# Patient Record
Sex: Female | Born: 1935 | Race: White | Hispanic: No | Smoking: Former smoker
Health system: Southern US, Community
[De-identification: ages and names within clinical notes are randomized; demographics above are authoritative.]

## PROBLEM LIST (undated history)

## (undated) DIAGNOSIS — E1151 Type 2 diabetes mellitus with diabetic peripheral angiopathy without gangrene: Secondary | ICD-10-CM

## (undated) DIAGNOSIS — E559 Vitamin D deficiency, unspecified: Secondary | ICD-10-CM

## (undated) DIAGNOSIS — IMO0001 Reserved for inherently not codable concepts without codable children: Secondary | ICD-10-CM

## (undated) DIAGNOSIS — R059 Cough, unspecified: Secondary | ICD-10-CM

## (undated) DIAGNOSIS — M19011 Primary osteoarthritis, right shoulder: Secondary | ICD-10-CM

## (undated) DIAGNOSIS — I4819 Other persistent atrial fibrillation: Secondary | ICD-10-CM

## (undated) DIAGNOSIS — E08319 Diabetes mellitus due to underlying condition with unspecified diabetic retinopathy without macular edema: Secondary | ICD-10-CM

## (undated) DIAGNOSIS — F0390 Unspecified dementia without behavioral disturbance: Secondary | ICD-10-CM

## (undated) DIAGNOSIS — G3183 Dementia with Lewy bodies: Secondary | ICD-10-CM

## (undated) DIAGNOSIS — H542X11 Low vision right eye category 1, low vision left eye category 1: Secondary | ICD-10-CM

## (undated) DIAGNOSIS — Z794 Long term (current) use of insulin: Secondary | ICD-10-CM

## (undated) DIAGNOSIS — N809 Endometriosis, unspecified: Secondary | ICD-10-CM

## (undated) DIAGNOSIS — R159 Full incontinence of feces: Secondary | ICD-10-CM

## (undated) DIAGNOSIS — M545 Low back pain, unspecified: Secondary | ICD-10-CM

## (undated) DIAGNOSIS — R269 Unspecified abnormalities of gait and mobility: Secondary | ICD-10-CM

## (undated) DIAGNOSIS — G301 Alzheimer's disease with late onset: Secondary | ICD-10-CM

## (undated) DIAGNOSIS — F32A Depression, unspecified: Secondary | ICD-10-CM

## (undated) DIAGNOSIS — Z96619 Presence of unspecified artificial shoulder joint: Secondary | ICD-10-CM

## (undated) DIAGNOSIS — T4145XA Adverse effect of unspecified anesthetic, initial encounter: Secondary | ICD-10-CM

## (undated) DIAGNOSIS — Z9289 Personal history of other medical treatment: Secondary | ICD-10-CM

## (undated) DIAGNOSIS — Z515 Encounter for palliative care: Secondary | ICD-10-CM

## (undated) DIAGNOSIS — E1143 Type 2 diabetes mellitus with diabetic autonomic (poly)neuropathy: Secondary | ICD-10-CM

## (undated) DIAGNOSIS — R32 Unspecified urinary incontinence: Secondary | ICD-10-CM

## (undated) DIAGNOSIS — H33001 Unspecified retinal detachment with retinal break, right eye: Secondary | ICD-10-CM

## (undated) DIAGNOSIS — F329 Major depressive disorder, single episode, unspecified: Secondary | ICD-10-CM

## (undated) DIAGNOSIS — M1990 Unspecified osteoarthritis, unspecified site: Secondary | ICD-10-CM

## (undated) DIAGNOSIS — K2100 Gastro-esophageal reflux disease with esophagitis, without bleeding: Secondary | ICD-10-CM

## (undated) DIAGNOSIS — I739 Peripheral vascular disease, unspecified: Secondary | ICD-10-CM

## (undated) DIAGNOSIS — I10 Essential (primary) hypertension: Secondary | ICD-10-CM

## (undated) DIAGNOSIS — M6281 Muscle weakness (generalized): Secondary | ICD-10-CM

## (undated) DIAGNOSIS — R51 Headache: Secondary | ICD-10-CM

## (undated) DIAGNOSIS — K5900 Constipation, unspecified: Secondary | ICD-10-CM

## (undated) DIAGNOSIS — K219 Gastro-esophageal reflux disease without esophagitis: Secondary | ICD-10-CM

## (undated) DIAGNOSIS — F419 Anxiety disorder, unspecified: Secondary | ICD-10-CM

## (undated) DIAGNOSIS — I6523 Occlusion and stenosis of bilateral carotid arteries: Secondary | ICD-10-CM

## (undated) DIAGNOSIS — IMO0002 Reserved for concepts with insufficient information to code with codable children: Secondary | ICD-10-CM

## (undated) DIAGNOSIS — I5032 Chronic diastolic (congestive) heart failure: Secondary | ICD-10-CM

## (undated) DIAGNOSIS — W19XXXA Unspecified fall, initial encounter: Secondary | ICD-10-CM

## (undated) DIAGNOSIS — I4891 Unspecified atrial fibrillation: Secondary | ICD-10-CM

## (undated) DIAGNOSIS — E785 Hyperlipidemia, unspecified: Secondary | ICD-10-CM

## (undated) DIAGNOSIS — E538 Deficiency of other specified B group vitamins: Secondary | ICD-10-CM

## (undated) DIAGNOSIS — I272 Pulmonary hypertension, unspecified: Secondary | ICD-10-CM

## (undated) DIAGNOSIS — J9601 Acute respiratory failure with hypoxia: Secondary | ICD-10-CM

## (undated) DIAGNOSIS — I779 Disorder of arteries and arterioles, unspecified: Secondary | ICD-10-CM

## (undated) DIAGNOSIS — E669 Obesity, unspecified: Secondary | ICD-10-CM

## (undated) DIAGNOSIS — E119 Type 2 diabetes mellitus without complications: Secondary | ICD-10-CM

## (undated) DIAGNOSIS — K3184 Gastroparesis: Secondary | ICD-10-CM

## (undated) DIAGNOSIS — R339 Retention of urine, unspecified: Secondary | ICD-10-CM

## (undated) DIAGNOSIS — K5792 Diverticulitis of intestine, part unspecified, without perforation or abscess without bleeding: Secondary | ICD-10-CM

## (undated) DIAGNOSIS — F0281 Dementia in other diseases classified elsewhere with behavioral disturbance: Secondary | ICD-10-CM

## (undated) DIAGNOSIS — T8859XA Other complications of anesthesia, initial encounter: Secondary | ICD-10-CM

## (undated) DIAGNOSIS — I110 Hypertensive heart disease with heart failure: Secondary | ICD-10-CM

## (undated) DIAGNOSIS — E039 Hypothyroidism, unspecified: Secondary | ICD-10-CM

## (undated) DIAGNOSIS — K449 Diaphragmatic hernia without obstruction or gangrene: Secondary | ICD-10-CM

## (undated) DIAGNOSIS — E079 Disorder of thyroid, unspecified: Secondary | ICD-10-CM

## (undated) DIAGNOSIS — H409 Unspecified glaucoma: Secondary | ICD-10-CM

## (undated) DIAGNOSIS — N318 Other neuromuscular dysfunction of bladder: Secondary | ICD-10-CM

## (undated) DIAGNOSIS — I422 Other hypertrophic cardiomyopathy: Secondary | ICD-10-CM

## (undated) DIAGNOSIS — I2510 Atherosclerotic heart disease of native coronary artery without angina pectoris: Secondary | ICD-10-CM

## (undated) DIAGNOSIS — D649 Anemia, unspecified: Secondary | ICD-10-CM

## (undated) DIAGNOSIS — G458 Other transient cerebral ischemic attacks and related syndromes: Secondary | ICD-10-CM

## (undated) DIAGNOSIS — S72009A Fracture of unspecified part of neck of unspecified femur, initial encounter for closed fracture: Secondary | ICD-10-CM

## (undated) DIAGNOSIS — I509 Heart failure, unspecified: Secondary | ICD-10-CM

## (undated) DIAGNOSIS — Z87898 Personal history of other specified conditions: Secondary | ICD-10-CM

## (undated) DIAGNOSIS — I639 Cerebral infarction, unspecified: Secondary | ICD-10-CM

## (undated) DIAGNOSIS — G8929 Other chronic pain: Secondary | ICD-10-CM

## (undated) DIAGNOSIS — Y92009 Unspecified place in unspecified non-institutional (private) residence as the place of occurrence of the external cause: Secondary | ICD-10-CM

## (undated) DIAGNOSIS — R05 Cough: Secondary | ICD-10-CM

## (undated) DIAGNOSIS — F0280 Dementia in other diseases classified elsewhere without behavioral disturbance: Secondary | ICD-10-CM

## (undated) DIAGNOSIS — R519 Headache, unspecified: Secondary | ICD-10-CM

## (undated) HISTORY — DX: Retention of urine, unspecified: R33.9

## (undated) HISTORY — PX: HAND SURGERY: SHX662

## (undated) HISTORY — DX: Diabetes mellitus due to underlying condition with unspecified diabetic retinopathy without macular edema: E08.319

## (undated) HISTORY — DX: Long term (current) use of insulin: Z79.4

## (undated) HISTORY — PX: EYE SURGERY: SHX253

## (undated) HISTORY — PX: TOTAL KNEE ARTHROPLASTY: SHX125

## (undated) HISTORY — DX: Essential (primary) hypertension: I10

## (undated) HISTORY — DX: Peripheral vascular disease, unspecified: I73.9

## (undated) HISTORY — DX: Type 2 diabetes mellitus with diabetic peripheral angiopathy without gangrene: E11.51

## (undated) HISTORY — DX: Unspecified place in unspecified non-institutional (private) residence as the place of occurrence of the external cause: Y92.009

## (undated) HISTORY — DX: Gastroparesis: K31.84

## (undated) HISTORY — PX: DILATION AND CURETTAGE OF UTERUS: SHX78

## (undated) HISTORY — DX: Low back pain, unspecified: M54.50

## (undated) HISTORY — DX: Presence of unspecified artificial shoulder joint: Z96.619

## (undated) HISTORY — DX: Unspecified osteoarthritis, unspecified site: M19.90

## (undated) HISTORY — DX: Acute respiratory failure with hypoxia: J96.01

## (undated) HISTORY — DX: Obesity, unspecified: E66.9

## (undated) HISTORY — DX: Gastro-esophageal reflux disease with esophagitis: K21.0

## (undated) HISTORY — DX: Low back pain: M54.5

## (undated) HISTORY — DX: Constipation, unspecified: K59.00

## (undated) HISTORY — DX: Depression, unspecified: F32.A

## (undated) HISTORY — DX: Reserved for inherently not codable concepts without codable children: IMO0001

## (undated) HISTORY — DX: Occlusion and stenosis of bilateral carotid arteries: I65.23

## (undated) HISTORY — DX: Atherosclerotic heart disease of native coronary artery without angina pectoris: I25.10

## (undated) HISTORY — DX: Chronic diastolic (congestive) heart failure: I50.32

## (undated) HISTORY — DX: Muscle weakness (generalized): M62.81

## (undated) HISTORY — DX: Major depressive disorder, single episode, unspecified: F32.9

## (undated) HISTORY — DX: Pulmonary hypertension, unspecified: I27.20

## (undated) HISTORY — DX: Vitamin D deficiency, unspecified: E55.9

## (undated) HISTORY — DX: Other persistent atrial fibrillation: I48.19

## (undated) HISTORY — DX: Dementia in other diseases classified elsewhere with behavioral disturbance: F02.81

## (undated) HISTORY — PX: POSTERIOR FUSION LUMBAR SPINE: SUR632

## (undated) HISTORY — PX: CATARACT EXTRACTION W/ INTRAOCULAR LENS  IMPLANT, BILATERAL: SHX1307

## (undated) HISTORY — DX: Alzheimer's disease with late onset: G30.1

## (undated) HISTORY — DX: Primary osteoarthritis, right shoulder: M19.011

## (undated) HISTORY — DX: Unspecified glaucoma: H40.9

## (undated) HISTORY — PX: NEUROPLASTY / TRANSPOSITION MEDIAN NERVE AT CARPAL TUNNEL BILATERAL: SUR894

## (undated) HISTORY — DX: Unspecified dementia without behavioral disturbance: F03.90

## (undated) HISTORY — PX: BACK SURGERY: SHX140

## (undated) HISTORY — DX: Disorder of arteries and arterioles, unspecified: I77.9

## (undated) HISTORY — DX: Unspecified retinal detachment with retinal break, right eye: H33.001

## (undated) HISTORY — DX: Unspecified fall, initial encounter: W19.XXXA

## (undated) HISTORY — DX: Unspecified abnormalities of gait and mobility: R26.9

## (undated) HISTORY — PX: JOINT REPLACEMENT: SHX530

## (undated) HISTORY — DX: Hypertensive heart disease with heart failure: I11.0

## (undated) HISTORY — DX: Dementia with Lewy bodies: G31.83

## (undated) HISTORY — DX: Heart failure, unspecified: I50.9

## (undated) HISTORY — DX: Endometriosis, unspecified: N80.9

## (undated) HISTORY — DX: Encounter for palliative care: Z51.5

## (undated) HISTORY — DX: Gastro-esophageal reflux disease without esophagitis: K21.9

## (undated) HISTORY — DX: Anxiety disorder, unspecified: F41.9

## (undated) HISTORY — DX: Dementia in other diseases classified elsewhere without behavioral disturbance: F02.80

## (undated) HISTORY — PX: CHOLECYSTECTOMY: SHX55

## (undated) HISTORY — DX: Low vision right eye category 1, low vision left eye category 1: H54.2X11

## (undated) HISTORY — DX: Other neuromuscular dysfunction of bladder: N31.8

## (undated) HISTORY — DX: Personal history of other medical treatment: Z92.89

## (undated) HISTORY — DX: Deficiency of other specified B group vitamins: E53.8

## (undated) HISTORY — DX: Other hypertrophic cardiomyopathy: I42.2

## (undated) HISTORY — DX: Type 2 diabetes mellitus without complications: E11.9

## (undated) HISTORY — PX: CARPAL TUNNEL RELEASE: SHX101

## (undated) HISTORY — DX: Gastro-esophageal reflux disease with esophagitis, without bleeding: K21.00

## (undated) HISTORY — DX: Diaphragmatic hernia without obstruction or gangrene: K44.9

## (undated) HISTORY — DX: Diverticulitis of intestine, part unspecified, without perforation or abscess without bleeding: K57.92

## (undated) HISTORY — DX: Type 2 diabetes mellitus with diabetic autonomic (poly)neuropathy: E11.43

## (undated) HISTORY — DX: Disorder of thyroid, unspecified: E07.9

## (undated) HISTORY — DX: Fracture of unspecified part of neck of unspecified femur, initial encounter for closed fracture: S72.009A

## (undated) HISTORY — DX: Unspecified atrial fibrillation: I48.91

## (undated) HISTORY — DX: Other transient cerebral ischemic attacks and related syndromes: G45.8

## (undated) HISTORY — DX: Hyperlipidemia, unspecified: E78.5

## (undated) HISTORY — PX: CARDIAC CATHETERIZATION: SHX172

---

## 1999-04-22 ENCOUNTER — Ambulatory Visit (HOSPITAL_COMMUNITY): Admission: RE | Admit: 1999-04-22 | Discharge: 1999-04-22 | Payer: Self-pay | Admitting: Gastroenterology

## 1999-04-22 ENCOUNTER — Encounter: Payer: Self-pay | Admitting: Gastroenterology

## 2001-08-16 ENCOUNTER — Other Ambulatory Visit: Admission: RE | Admit: 2001-08-16 | Discharge: 2001-08-16 | Payer: Self-pay | Admitting: Obstetrics and Gynecology

## 2001-12-20 ENCOUNTER — Encounter: Payer: Self-pay | Admitting: Internal Medicine

## 2001-12-20 ENCOUNTER — Encounter: Admission: RE | Admit: 2001-12-20 | Discharge: 2001-12-20 | Payer: Self-pay | Admitting: Internal Medicine

## 2002-01-11 ENCOUNTER — Encounter: Admission: RE | Admit: 2002-01-11 | Discharge: 2002-01-11 | Payer: Self-pay | Admitting: Geriatric Medicine

## 2002-01-11 ENCOUNTER — Encounter: Payer: Self-pay | Admitting: Geriatric Medicine

## 2002-01-24 ENCOUNTER — Encounter: Admission: RE | Admit: 2002-01-24 | Discharge: 2002-01-24 | Payer: Self-pay | Admitting: Internal Medicine

## 2002-01-24 ENCOUNTER — Encounter: Payer: Self-pay | Admitting: Internal Medicine

## 2002-02-03 ENCOUNTER — Encounter: Admission: RE | Admit: 2002-02-03 | Discharge: 2002-02-03 | Payer: Self-pay | Admitting: Internal Medicine

## 2002-02-03 ENCOUNTER — Encounter: Payer: Self-pay | Admitting: Internal Medicine

## 2002-03-16 ENCOUNTER — Encounter: Admission: RE | Admit: 2002-03-16 | Discharge: 2002-03-16 | Payer: Self-pay | Admitting: Internal Medicine

## 2002-03-16 ENCOUNTER — Encounter: Payer: Self-pay | Admitting: Internal Medicine

## 2002-04-26 ENCOUNTER — Encounter: Admission: RE | Admit: 2002-04-26 | Discharge: 2002-04-26 | Payer: Self-pay | Admitting: Internal Medicine

## 2002-04-26 ENCOUNTER — Encounter: Payer: Self-pay | Admitting: Internal Medicine

## 2002-08-19 ENCOUNTER — Encounter: Payer: Self-pay | Admitting: Orthopaedic Surgery

## 2002-08-22 ENCOUNTER — Inpatient Hospital Stay (HOSPITAL_COMMUNITY): Admission: RE | Admit: 2002-08-22 | Discharge: 2002-08-25 | Payer: Self-pay | Admitting: Orthopaedic Surgery

## 2002-08-25 ENCOUNTER — Inpatient Hospital Stay (HOSPITAL_COMMUNITY)
Admission: RE | Admit: 2002-08-25 | Discharge: 2002-09-01 | Payer: Self-pay | Admitting: Physical Medicine & Rehabilitation

## 2002-11-04 ENCOUNTER — Encounter: Admission: RE | Admit: 2002-11-04 | Discharge: 2002-11-04 | Payer: Self-pay | Admitting: Orthopaedic Surgery

## 2002-11-04 ENCOUNTER — Encounter: Payer: Self-pay | Admitting: Orthopaedic Surgery

## 2002-11-24 ENCOUNTER — Encounter: Payer: Self-pay | Admitting: Orthopaedic Surgery

## 2002-11-24 ENCOUNTER — Encounter: Admission: RE | Admit: 2002-11-24 | Discharge: 2002-11-24 | Payer: Self-pay | Admitting: Orthopaedic Surgery

## 2002-12-09 ENCOUNTER — Encounter: Payer: Self-pay | Admitting: Orthopaedic Surgery

## 2002-12-09 ENCOUNTER — Encounter: Admission: RE | Admit: 2002-12-09 | Discharge: 2002-12-09 | Payer: Self-pay | Admitting: Orthopaedic Surgery

## 2002-12-26 ENCOUNTER — Encounter: Payer: Self-pay | Admitting: Orthopaedic Surgery

## 2002-12-26 ENCOUNTER — Encounter: Admission: RE | Admit: 2002-12-26 | Discharge: 2002-12-26 | Payer: Self-pay | Admitting: Orthopaedic Surgery

## 2003-06-28 ENCOUNTER — Ambulatory Visit (HOSPITAL_COMMUNITY): Admission: RE | Admit: 2003-06-28 | Discharge: 2003-06-28 | Payer: Self-pay | Admitting: Internal Medicine

## 2003-06-28 ENCOUNTER — Encounter: Payer: Self-pay | Admitting: Internal Medicine

## 2003-06-29 ENCOUNTER — Encounter: Admission: RE | Admit: 2003-06-29 | Discharge: 2003-06-29 | Payer: Self-pay | Admitting: Internal Medicine

## 2003-06-29 ENCOUNTER — Encounter: Payer: Self-pay | Admitting: Internal Medicine

## 2003-07-11 ENCOUNTER — Inpatient Hospital Stay (HOSPITAL_COMMUNITY): Admission: RE | Admit: 2003-07-11 | Discharge: 2003-07-22 | Payer: Self-pay | Admitting: Neurosurgery

## 2004-02-27 ENCOUNTER — Ambulatory Visit (HOSPITAL_COMMUNITY): Admission: RE | Admit: 2004-02-27 | Discharge: 2004-02-27 | Payer: Self-pay | Admitting: Internal Medicine

## 2004-03-05 ENCOUNTER — Other Ambulatory Visit: Admission: RE | Admit: 2004-03-05 | Discharge: 2004-03-05 | Payer: Self-pay | Admitting: Obstetrics and Gynecology

## 2004-05-01 ENCOUNTER — Ambulatory Visit (HOSPITAL_COMMUNITY): Admission: RE | Admit: 2004-05-01 | Discharge: 2004-05-01 | Payer: Self-pay | Admitting: Orthopedic Surgery

## 2004-05-01 ENCOUNTER — Ambulatory Visit (HOSPITAL_BASED_OUTPATIENT_CLINIC_OR_DEPARTMENT_OTHER): Admission: RE | Admit: 2004-05-01 | Discharge: 2004-05-01 | Payer: Self-pay | Admitting: Orthopedic Surgery

## 2004-05-13 ENCOUNTER — Ambulatory Visit (HOSPITAL_COMMUNITY): Admission: RE | Admit: 2004-05-13 | Discharge: 2004-05-13 | Payer: Self-pay | Admitting: Gastroenterology

## 2004-05-13 ENCOUNTER — Encounter (INDEPENDENT_AMBULATORY_CARE_PROVIDER_SITE_OTHER): Payer: Self-pay | Admitting: Specialist

## 2004-07-05 ENCOUNTER — Ambulatory Visit (HOSPITAL_COMMUNITY): Admission: RE | Admit: 2004-07-05 | Discharge: 2004-07-05 | Payer: Self-pay | Admitting: Obstetrics and Gynecology

## 2004-08-09 ENCOUNTER — Encounter: Admission: RE | Admit: 2004-08-09 | Discharge: 2004-08-09 | Payer: Self-pay | Admitting: Neurosurgery

## 2004-08-12 ENCOUNTER — Ambulatory Visit (HOSPITAL_COMMUNITY): Admission: RE | Admit: 2004-08-12 | Discharge: 2004-08-12 | Payer: Self-pay | Admitting: Gastroenterology

## 2004-10-11 ENCOUNTER — Inpatient Hospital Stay (HOSPITAL_COMMUNITY): Admission: RE | Admit: 2004-10-11 | Discharge: 2004-10-16 | Payer: Self-pay | Admitting: Neurosurgery

## 2004-10-15 ENCOUNTER — Ambulatory Visit: Payer: Self-pay | Admitting: Physical Medicine & Rehabilitation

## 2004-10-16 ENCOUNTER — Inpatient Hospital Stay
Admission: RE | Admit: 2004-10-16 | Discharge: 2004-10-19 | Payer: Self-pay | Admitting: Physical Medicine & Rehabilitation

## 2007-05-11 ENCOUNTER — Ambulatory Visit (HOSPITAL_COMMUNITY): Admission: RE | Admit: 2007-05-11 | Discharge: 2007-05-11 | Payer: Self-pay | Admitting: Neurosurgery

## 2007-07-09 ENCOUNTER — Ambulatory Visit: Payer: Self-pay | Admitting: Vascular Surgery

## 2007-12-13 ENCOUNTER — Encounter: Payer: Self-pay | Admitting: Internal Medicine

## 2008-03-13 ENCOUNTER — Other Ambulatory Visit: Admission: RE | Admit: 2008-03-13 | Discharge: 2008-03-13 | Payer: Self-pay | Admitting: Obstetrics and Gynecology

## 2008-03-23 ENCOUNTER — Ambulatory Visit (HOSPITAL_COMMUNITY): Admission: RE | Admit: 2008-03-23 | Discharge: 2008-03-23 | Payer: Self-pay | Admitting: Obstetrics and Gynecology

## 2008-06-30 ENCOUNTER — Ambulatory Visit: Payer: Self-pay | Admitting: Vascular Surgery

## 2009-05-25 ENCOUNTER — Encounter: Admission: RE | Admit: 2009-05-25 | Discharge: 2009-05-25 | Payer: Self-pay | Admitting: Internal Medicine

## 2009-07-06 ENCOUNTER — Ambulatory Visit: Payer: Self-pay | Admitting: Vascular Surgery

## 2009-07-24 ENCOUNTER — Ambulatory Visit: Payer: Self-pay | Admitting: Vascular Surgery

## 2009-09-15 DIAGNOSIS — I639 Cerebral infarction, unspecified: Secondary | ICD-10-CM

## 2009-09-15 HISTORY — DX: Cerebral infarction, unspecified: I63.9

## 2009-11-06 ENCOUNTER — Emergency Department (HOSPITAL_COMMUNITY): Admission: EM | Admit: 2009-11-06 | Discharge: 2009-11-07 | Payer: Self-pay | Admitting: Emergency Medicine

## 2009-11-13 HISTORY — PX: PARTIAL HIP ARTHROPLASTY: SHX733

## 2009-11-16 ENCOUNTER — Inpatient Hospital Stay (HOSPITAL_COMMUNITY): Admission: EM | Admit: 2009-11-16 | Discharge: 2009-11-22 | Payer: Self-pay | Admitting: Emergency Medicine

## 2009-12-06 ENCOUNTER — Encounter: Admission: RE | Admit: 2009-12-06 | Discharge: 2009-12-06 | Payer: Self-pay | Admitting: Internal Medicine

## 2010-01-10 ENCOUNTER — Ambulatory Visit: Payer: Self-pay | Admitting: Vascular Surgery

## 2010-04-17 ENCOUNTER — Ambulatory Visit (HOSPITAL_COMMUNITY): Admission: RE | Admit: 2010-04-17 | Discharge: 2010-04-17 | Payer: Self-pay | Admitting: Internal Medicine

## 2010-10-06 ENCOUNTER — Encounter: Payer: Self-pay | Admitting: Internal Medicine

## 2010-10-07 ENCOUNTER — Encounter: Payer: Self-pay | Admitting: Obstetrics and Gynecology

## 2010-10-07 IMAGING — CT CT HEAD W/O CM
2 series · 15 of 30 positions shown, 19 images · non-contrast
Comparison: None

CLINICAL DATA: Mental status change.  Dizziness.  Slurred speech.
Possible stroke.

CT HEAD WITHOUT CONTRAST
TECHNIQUE: Contiguous axial images were obtained from the base of
the skull through the vertex without contrast.

[Series 2: head wo · axial · 0.49mm/px · z∈[+574,+702]mm · 13 of 30 slices shown, 17 images]
[im 3/30  brain]
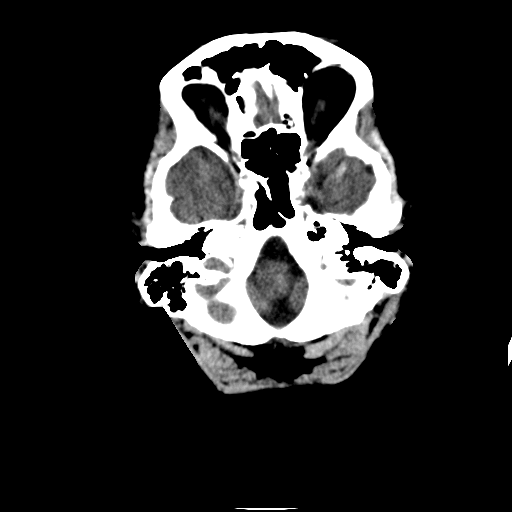
[im 3/30  bone]
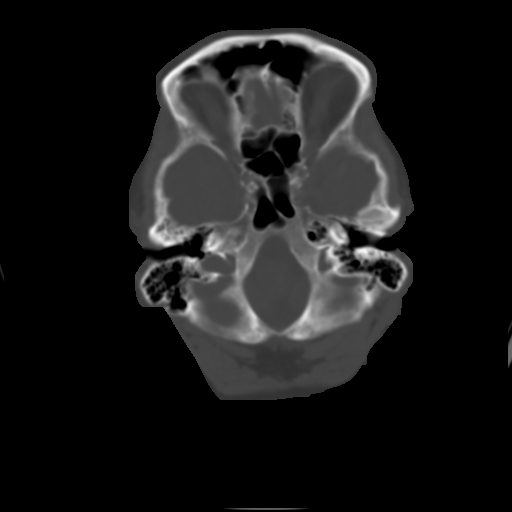
[im 5/30  brain]
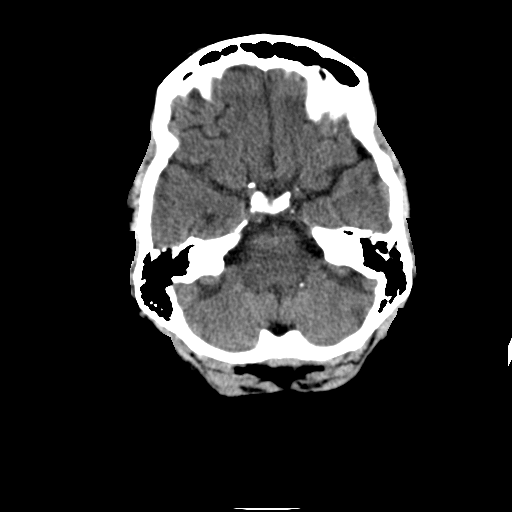
[im 7/30  brain]
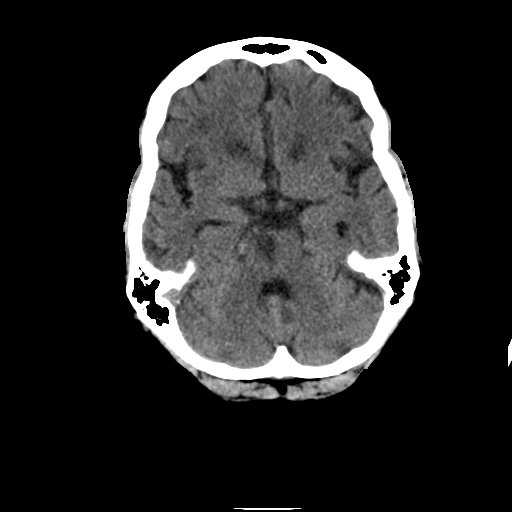
[im 9/30  brain]
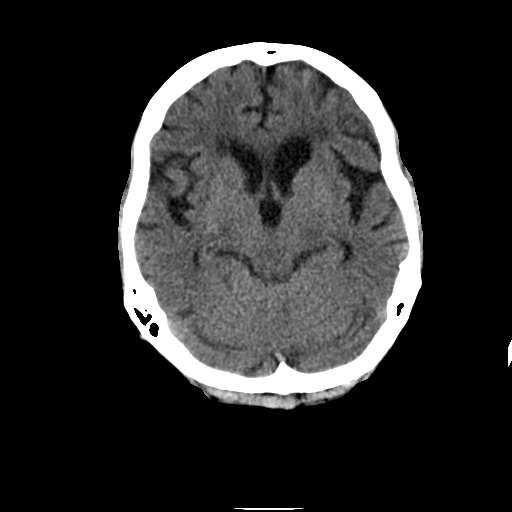
[im 11/30  brain]
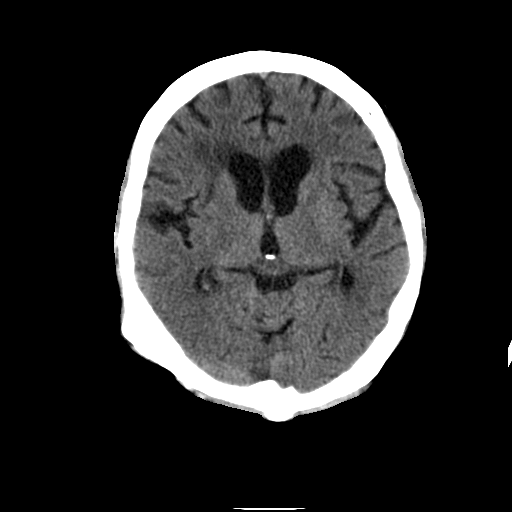
[im 11/30  bone]
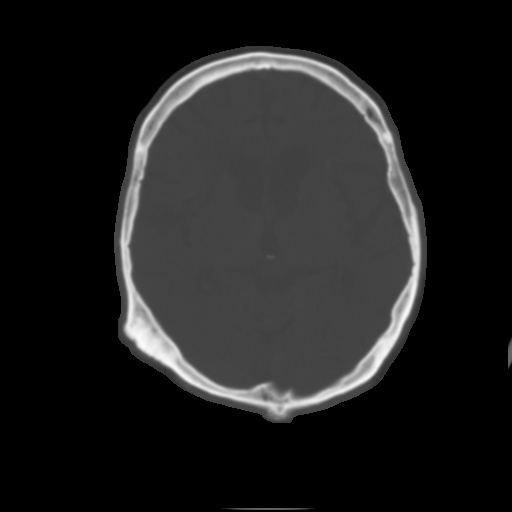
[im 13/30  brain]
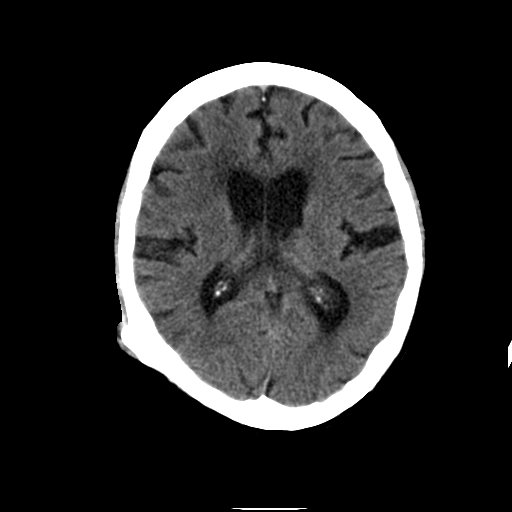
[im 15/30  brain]
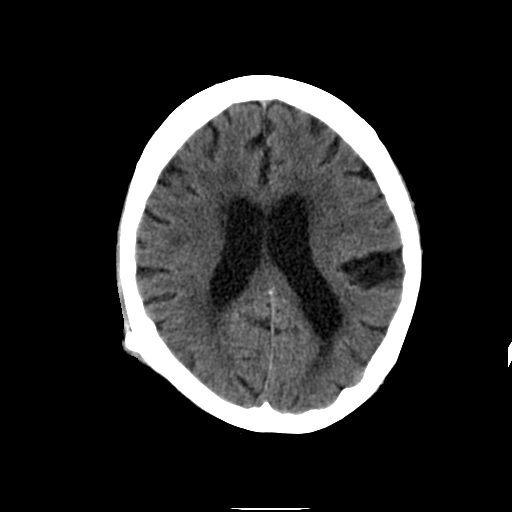
[im 17/30  brain]
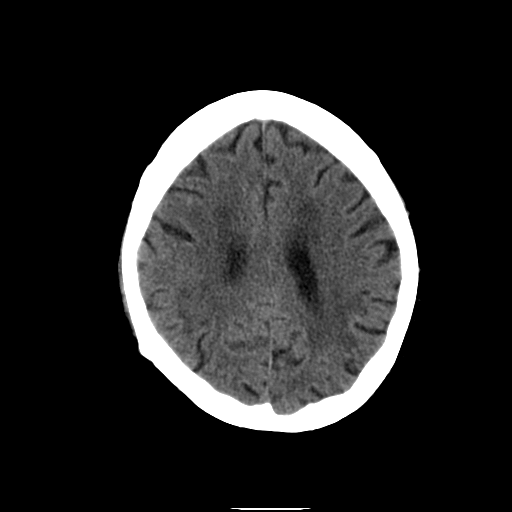
[im 19/30  brain]
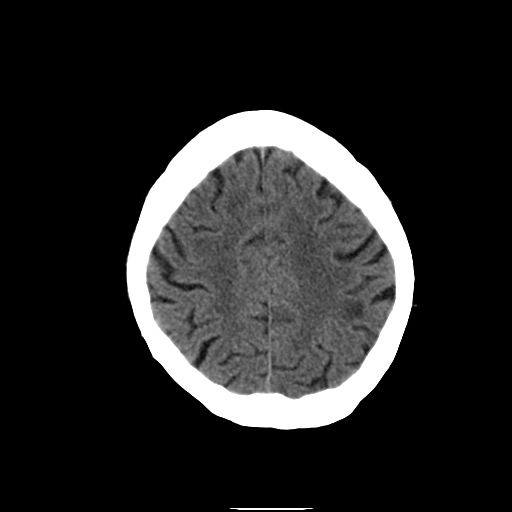
[im 19/30  bone]
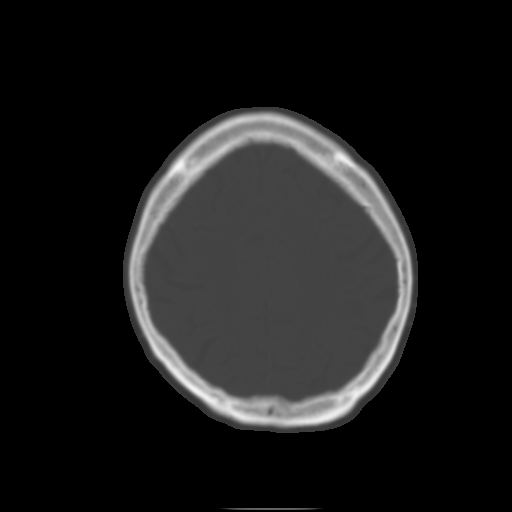
[im 21/30  brain]
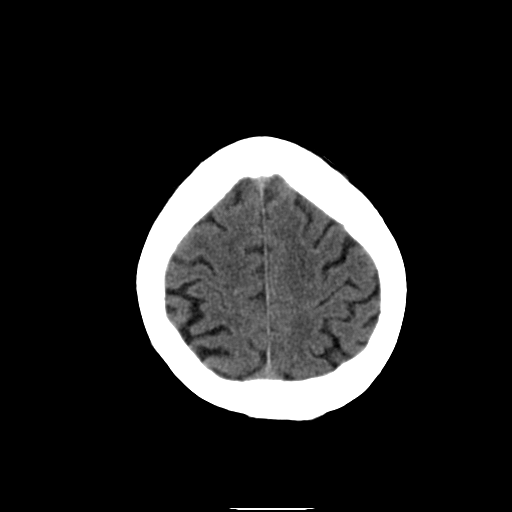
[im 23/30  brain]
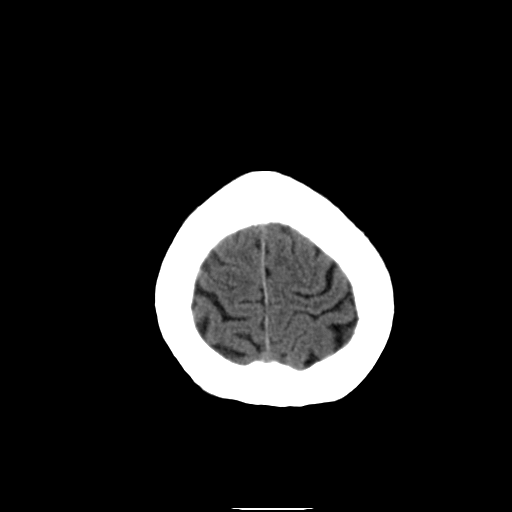
[im 25/30  brain]
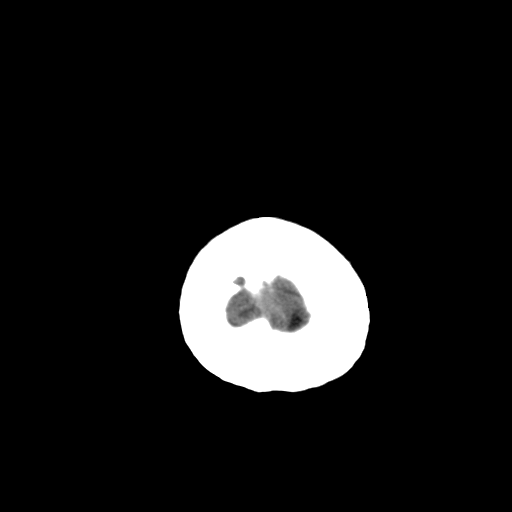
[im 27/30  brain]
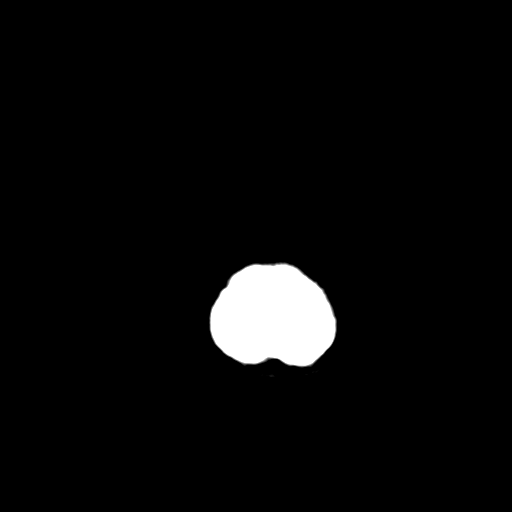
[im 27/30  bone]
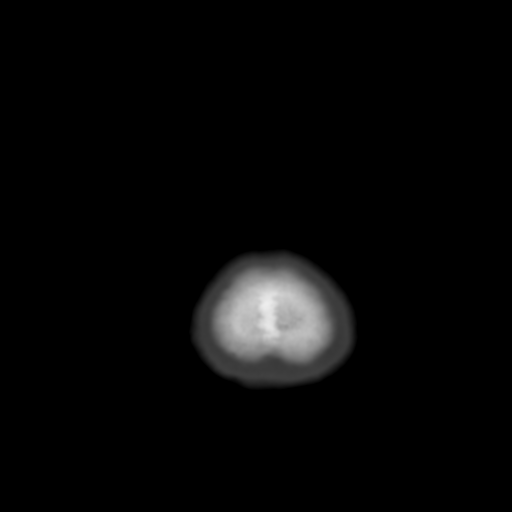

[Series 3: head bone · axial · 0.49mm/px · z∈[+574,+595]mm · 2 of 30 slices shown]
[im 3/30  bone]
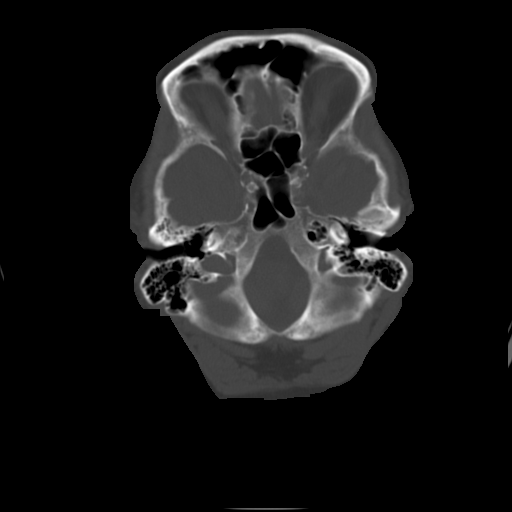
[im 7/30  bone]
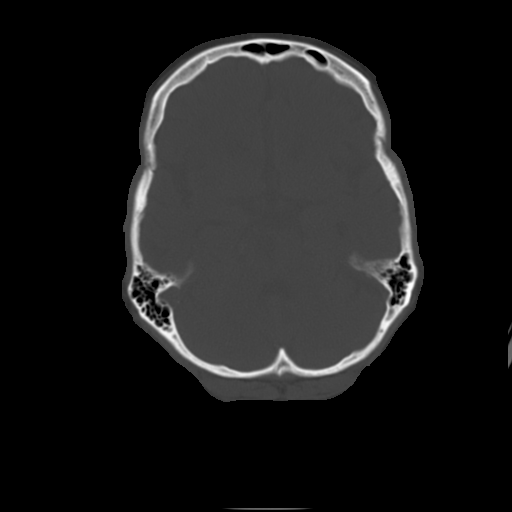

[15 of 30 positions shown; findings below may reference images not displayed]

FINDINGS: There is a background pattern of age related atrophy.
There are extensive changes of low density throughout the
hemispheric white matter consistent with chronic small vessel
disease.  Certainly, an acute or subacute white matter infarction
could be hidden amongst these extensive chronic changes, but there
is no specific finding to allow that diagnosis.  There is focal low
density in the right side of the pons.  This area does not appear
abnormal as seen on a cervical spine MRI of April 2007.  This
could be an old pontine infarction or could be subacute.  There is
no intracranial hemorrhage.  No hydrocephalus, mass lesion or extra-
axial collection.  Sinuses, middle ears and mastoids are clear.
There is benign overgrowth of the calvarium in the right parietal
region.
IMPRESSION: Atrophy and chronic appearing small vessel disease of the
hemispheric white matter.  Certainly, an acute or subacute white
matter infarction could be hidden amongst these extensive chronic
changes.

Low density affecting the right side of the pons.  This could be an
old or subacute pontine infarction.

## 2010-10-18 ENCOUNTER — Ambulatory Visit: Admit: 2010-10-18 | Payer: Self-pay

## 2010-10-18 ENCOUNTER — Other Ambulatory Visit (INDEPENDENT_AMBULATORY_CARE_PROVIDER_SITE_OTHER): Payer: Medicare Other

## 2010-10-18 ENCOUNTER — Ambulatory Visit (INDEPENDENT_AMBULATORY_CARE_PROVIDER_SITE_OTHER): Payer: Medicare Other

## 2010-10-18 DIAGNOSIS — I6529 Occlusion and stenosis of unspecified carotid artery: Secondary | ICD-10-CM

## 2010-10-25 NOTE — Assessment & Plan Note (Signed)
OFFICE VISIT  FINESSE, FIELDER DOB:  1933-11-28                                       10/18/2010 LOVFI#:[id number]  CHIEF COMPLAINT:  Followup carotid artery stenosis.  HISTORY OF PRESENT ILLNESS:  The patient is a 77 year old woman who we have been following with serial ultrasounds for bilateral carotid stenosis.  She states she is not having any weakness on one side or the other, no amaurosis.  However, she states recently she has had some headache and some dizziness but that her blood pressure has been systolic in the 190s recently for which she will see her doctor, Dr. Earl Gala.  She denies any weakness on one side or the other.  She states occasionally she would have some difficulty getting words out and not saying things correctly.  However, this was mostly after she had taken a new sleeping pill otherwise she had no other symptoms.  She is 1 year status post a right hip replacement for fracture and also has had multiple problems with her back.  The patient is left hand dominant.  Vascular lab, carotid duplex scan done today showed 40% to 59% stenosis on the right and 60% to 79% stenosis on the left with atypical antegrade right vertebral diminished end-diastolic velocities suggest of stenosis, internal thickening within the common carotid arteries and this is unchanged from her previous study done in April of 2011.  PHYSICAL EXAM:  This is a well-developed, well-nourished woman in no acute distress.  She is alert and oriented x3.  She walks with a limp and uses a cane.  Neurologically her speech is normal and without slurring.  She has good and equal strength in bilateral upper and lower extremities.  She has no tongue deviation.  No facial droop.  Chronic problems are hypertension for which she sees Dr. Earl Gala.  Her recent reading today was 192/86 and at another physician's office it was 196/96 per patient report.  She has diabetes,  hypercholesterolemia, depression, hypothyroidism and issues with bladder control.  Medications were reviewed today.  The patient is on felodipine and hydrochlorothiazide.  ASSESSMENT AND PLAN:  This patient was discussed with Dr. Imogene Burn.  It was felt that the patient had symptoms may be explained of dizziness and wooziness without hard signs of any stroke or transient ischemic attack to be secondary to her increased hypertension or other issues.  We have set her up for repeat studies of her carotids in 1 year.  We will also bring her back to see Dr. [name] in a month to review her symptoms.  The patient is going to call Dr. Earl Gala today to be seen as soon as possible regarding her blood pressure issues and she stated that she would call them as soon as she went home.  I offered to call the physician's office for her but she preferred to do this herself.  The patient was also given a handout on carotid artery disease and signs of stroke.  Della Goo, PA-C  Fransisco Hertz, MD Electronically Signed  RR/MEDQ  D:  10/18/2010  T:  10/18/2010  Job:  [id number]

## 2010-11-19 ENCOUNTER — Ambulatory Visit (INDEPENDENT_AMBULATORY_CARE_PROVIDER_SITE_OTHER): Payer: Medicare Other | Admitting: Vascular Surgery

## 2010-11-19 DIAGNOSIS — I6529 Occlusion and stenosis of unspecified carotid artery: Secondary | ICD-10-CM

## 2010-11-20 NOTE — Assessment & Plan Note (Signed)
OFFICE VISIT  AVIYANA, [name] [name] DOB:  03/09/1934                                       11/19/2010 ZOXWR#:[id number]  The patient presents today for continued discussion regarding symptomatic carotid disease.  We have followed her in our office for a number of years since 2008 and fortunately she has had no progression. She was seen back in early February.  At that time she was having some elevated hypertension.  She was having some dizziness and this has resolved with correction of her hypertension.  Her main complaint continues to be left hip pain.  She had replacement 1 year ago today and is still having pain and is still being worked up by Dr. Luiz Blare.  Her carotid arteries have bruits bilaterally.  Her blood pressure today is 151/78, pulse 86, respirations 18.  Lungs:  Clear bilaterally.  She is grossly intact neurologically.  I again reviewed her duplex with her recommend continue to see her on a 84-month interval.  She reports she has had no progression over time and hopefully will continue this way.  I did explain symptoms of carotid disease with her and she will notify us should this occur.    Larina Earthly, M.D. Electronically Signed  TFE/MEDQ  D:  11/19/2010  T:  11/20/2010  Job:  [id number]  cc:   Theressa Millard, M.D.

## 2010-12-08 LAB — URINALYSIS, MICROSCOPIC ONLY
Glucose, UA: NEGATIVE mg/dL
Ketones, ur: 15 mg/dL — AB
Nitrite: NEGATIVE
Protein, ur: NEGATIVE mg/dL
Specific Gravity, Urine: 1.022 (ref 1.005–1.030)
Urobilinogen, UA: 0.2 mg/dL (ref 0.0–1.0)
pH: 5.5 (ref 5.0–8.0)

## 2010-12-08 LAB — CBC
HCT: 32 % — ABNORMAL LOW (ref 36.0–46.0)
HCT: 34.7 % — ABNORMAL LOW (ref 36.0–46.0)
HCT: 35.4 % — ABNORMAL LOW (ref 36.0–46.0)
HCT: 38.7 % (ref 36.0–46.0)
HCT: 42.1 % (ref 36.0–46.0)
Hemoglobin: 11.2 g/dL — ABNORMAL LOW (ref 12.0–15.0)
Hemoglobin: 12.2 g/dL (ref 12.0–15.0)
Hemoglobin: 12.2 g/dL (ref 12.0–15.0)
Hemoglobin: 13.3 g/dL (ref 12.0–15.0)
Hemoglobin: 14.3 g/dL (ref 12.0–15.0)
MCHC: 33.9 g/dL (ref 30.0–36.0)
MCHC: 34.4 g/dL (ref 30.0–36.0)
MCHC: 34.5 g/dL (ref 30.0–36.0)
MCHC: 34.9 g/dL (ref 30.0–36.0)
MCHC: 35 g/dL (ref 30.0–36.0)
MCV: 97.7 fL (ref 78.0–100.0)
MCV: 98.5 fL (ref 78.0–100.0)
MCV: 98.5 fL (ref 78.0–100.0)
MCV: 98.6 fL (ref 78.0–100.0)
MCV: 98.7 fL (ref 78.0–100.0)
Platelets: 188 10*3/uL (ref 150–400)
Platelets: 211 10*3/uL (ref 150–400)
Platelets: 216 10*3/uL (ref 150–400)
Platelets: 298 10*3/uL (ref 150–400)
RBC: 3.25 MIL/uL — ABNORMAL LOW (ref 3.87–5.11)
RBC: 3.53 MIL/uL — ABNORMAL LOW (ref 3.87–5.11)
RBC: 3.6 MIL/uL — ABNORMAL LOW (ref 3.87–5.11)
RBC: 3.96 MIL/uL (ref 3.87–5.11)
RBC: 4.27 MIL/uL (ref 3.87–5.11)
RDW: 12.5 % (ref 11.5–15.5)
RDW: 12.8 % (ref 11.5–15.5)
RDW: 13.1 % (ref 11.5–15.5)
RDW: 13.3 % (ref 11.5–15.5)
WBC: 10.8 10*3/uL — ABNORMAL HIGH (ref 4.0–10.5)
WBC: 11 10*3/uL — ABNORMAL HIGH (ref 4.0–10.5)
WBC: 12.1 10*3/uL — ABNORMAL HIGH (ref 4.0–10.5)
WBC: 12.9 10*3/uL — ABNORMAL HIGH (ref 4.0–10.5)
WBC: 14.2 10*3/uL — ABNORMAL HIGH (ref 4.0–10.5)

## 2010-12-08 LAB — GLUCOSE, CAPILLARY
Glucose-Capillary: 147 mg/dL — ABNORMAL HIGH (ref 70–99)
Glucose-Capillary: 164 mg/dL — ABNORMAL HIGH (ref 70–99)
Glucose-Capillary: 176 mg/dL — ABNORMAL HIGH (ref 70–99)
Glucose-Capillary: 185 mg/dL — ABNORMAL HIGH (ref 70–99)
Glucose-Capillary: 213 mg/dL — ABNORMAL HIGH (ref 70–99)
Glucose-Capillary: 230 mg/dL — ABNORMAL HIGH (ref 70–99)
Glucose-Capillary: 231 mg/dL — ABNORMAL HIGH (ref 70–99)
Glucose-Capillary: 233 mg/dL — ABNORMAL HIGH (ref 70–99)
Glucose-Capillary: 251 mg/dL — ABNORMAL HIGH (ref 70–99)
Glucose-Capillary: 254 mg/dL — ABNORMAL HIGH (ref 70–99)
Glucose-Capillary: 305 mg/dL — ABNORMAL HIGH (ref 70–99)
Glucose-Capillary: 306 mg/dL — ABNORMAL HIGH (ref 70–99)
Glucose-Capillary: 308 mg/dL — ABNORMAL HIGH (ref 70–99)
Glucose-Capillary: 316 mg/dL — ABNORMAL HIGH (ref 70–99)

## 2010-12-08 LAB — BASIC METABOLIC PANEL
BUN: 13 mg/dL (ref 6–23)
BUN: 14 mg/dL (ref 6–23)
BUN: 14 mg/dL (ref 6–23)
BUN: 31 mg/dL — ABNORMAL HIGH (ref 6–23)
CO2: 25 mEq/L (ref 19–32)
CO2: 26 mEq/L (ref 19–32)
CO2: 26 mEq/L (ref 19–32)
CO2: 26 mEq/L (ref 19–32)
Calcium: 8.5 mg/dL (ref 8.4–10.5)
Calcium: 8.5 mg/dL (ref 8.4–10.5)
Calcium: 8.8 mg/dL (ref 8.4–10.5)
Calcium: 9.6 mg/dL (ref 8.4–10.5)
Chloride: 100 mEq/L (ref 96–112)
Chloride: 92 mEq/L — ABNORMAL LOW (ref 96–112)
Chloride: 99 mEq/L (ref 96–112)
Chloride: 99 mEq/L (ref 96–112)
Creatinine, Ser: 0.81 mg/dL (ref 0.4–1.2)
Creatinine, Ser: 0.94 mg/dL (ref 0.4–1.2)
Creatinine, Ser: 1.01 mg/dL (ref 0.4–1.2)
Creatinine, Ser: 1.06 mg/dL (ref 0.4–1.2)
GFR calc Af Amer: >60 mL/min (ref >60)
GFR calc Af Amer: >60 mL/min (ref >60)
GFR calc Af Amer: >60 mL/min (ref >60)
GFR calc Af Amer: >60 mL/min (ref >60)
GFR calc non Af Amer: 51 mL/min — ABNORMAL LOW (ref >60)
GFR calc non Af Amer: 54 mL/min — ABNORMAL LOW (ref >60)
GFR calc non Af Amer: 58 mL/min — ABNORMAL LOW (ref >60)
GFR calc non Af Amer: >60 mL/min (ref >60)
Glucose, Bld: 219 mg/dL — ABNORMAL HIGH (ref 70–99)
Glucose, Bld: 225 mg/dL — ABNORMAL HIGH (ref 70–99)
Glucose, Bld: 246 mg/dL — ABNORMAL HIGH (ref 70–99)
Glucose, Bld: 336 mg/dL — ABNORMAL HIGH (ref 70–99)
Potassium: 3.5 mEq/L (ref 3.5–5.1)
Potassium: 3.7 mEq/L (ref 3.5–5.1)
Potassium: 3.8 mEq/L (ref 3.5–5.1)
Potassium: 3.8 mEq/L (ref 3.5–5.1)
Sodium: 130 mEq/L — ABNORMAL LOW (ref 135–145)
Sodium: 132 mEq/L — ABNORMAL LOW (ref 135–145)
Sodium: 132 mEq/L — ABNORMAL LOW (ref 135–145)
Sodium: 135 mEq/L (ref 135–145)

## 2010-12-08 LAB — PROTIME-INR
INR: 1.13 (ref 0.00–1.49)
INR: 1.57 — ABNORMAL HIGH (ref 0.00–1.49)
INR: 1.81 — ABNORMAL HIGH (ref 0.00–1.49)
INR: 2.12 — ABNORMAL HIGH (ref 0.00–1.49)
INR: 2.18 — ABNORMAL HIGH (ref 0.00–1.49)
Prothrombin Time: 13.8 seconds (ref 11.6–15.2)
Prothrombin Time: 14.4 seconds (ref 11.6–15.2)
Prothrombin Time: 14.6 seconds (ref 11.6–15.2)
Prothrombin Time: 18.6 seconds — ABNORMAL HIGH (ref 11.6–15.2)
Prothrombin Time: 20.8 seconds — ABNORMAL HIGH (ref 11.6–15.2)
Prothrombin Time: 23.6 seconds — ABNORMAL HIGH (ref 11.6–15.2)
Prothrombin Time: 24.1 seconds — ABNORMAL HIGH (ref 11.6–15.2)

## 2010-12-08 LAB — CROSSMATCH

## 2010-12-08 LAB — URINE CULTURE
Colony Count: NO GROWTH
Culture: NO GROWTH

## 2010-12-08 LAB — COMPREHENSIVE METABOLIC PANEL
AST: 22 U/L (ref 0–37)
BUN: 24 mg/dL — ABNORMAL HIGH (ref 6–23)
CO2: 27 mEq/L (ref 19–32)
Calcium: 9.4 mg/dL (ref 8.4–10.5)
Chloride: 97 mEq/L (ref 96–112)
Creatinine, Ser: 1.04 mg/dL (ref 0.4–1.2)
GFR calc non Af Amer: 52 mL/min — ABNORMAL LOW (ref >60)
Glucose, Bld: 299 mg/dL — ABNORMAL HIGH (ref 70–99)
Total Bilirubin: 0.4 mg/dL (ref 0.3–1.2)

## 2010-12-08 LAB — DIFFERENTIAL
Basophils Absolute: 0 10*3/uL (ref 0.0–0.1)
Basophils Relative: 0 % (ref 0–1)
Eosinophils Relative: 1 % (ref 0–5)
Lymphocytes Relative: 7 % — ABNORMAL LOW (ref 12–46)
Monocytes Absolute: 0.4 10*3/uL (ref 0.1–1.0)

## 2010-12-08 LAB — MAGNESIUM: Magnesium: 1.5 mg/dL (ref 1.5–2.5)

## 2010-12-08 LAB — ABO/RH: ABO/RH(D): O POS

## 2010-12-08 LAB — T3, FREE: T3, Free: 2 pg/mL — ABNORMAL LOW (ref 2.3–4.2)

## 2010-12-08 LAB — TSH: TSH: 1.846 u[IU]/mL (ref 0.350–4.500)

## 2010-12-08 LAB — T4, FREE: Free T4: 1.34 ng/dL (ref 0.80–1.80)

## 2010-12-08 LAB — VITAMIN B12: Vitamin B-12: 408 pg/mL (ref 211–911)

## 2011-01-03 ENCOUNTER — Other Ambulatory Visit: Payer: Self-pay | Admitting: Internal Medicine

## 2011-01-03 DIAGNOSIS — R1032 Left lower quadrant pain: Secondary | ICD-10-CM

## 2011-01-07 ENCOUNTER — Other Ambulatory Visit: Payer: MEDICARE

## 2011-01-08 ENCOUNTER — Other Ambulatory Visit: Payer: MEDICARE

## 2011-01-10 ENCOUNTER — Other Ambulatory Visit: Payer: MEDICARE

## 2011-01-20 ENCOUNTER — Other Ambulatory Visit (HOSPITAL_COMMUNITY): Payer: Medicare Other

## 2011-01-28 NOTE — Consult Note (Signed)
NEW PATIENT CONSULTATION   [name] [name], [name] [name]  DOB:  04-28-1934                                       07/09/2007  ZOXWR#:[id number]   The patient presents today for evaluation of extensive vascular  occlusive disease and also has concern regarding pain in the right  medial malleolus.  She underwent a [facility] screening showing some  carotid disease and has undergone a lab only study in our office 1 year  ago showing moderate carotid stenoses bilaterally.  We are seeing her  today for followup of this.  She specifically denies any prior amaurosis  fugax, transient ischemic attack or stroke.  She does have some chronic  dizziness which is not limiting to her.  She does not have any cardiac  history.  She also complains of a burning and stinging sensation in the  region just behind her right medial malleolus and wants to discuss this  as well.   PAST MEDICAL HISTORY:  Significant for extensive prior lumbar disk  surgeries.  She does not have any history of coronary disease.  She does have non-insulin-dependent diabetes.  Hypertension.   PHYSICAL EXAMINATION:  General:  Well-developed, well-nourished white  female appearing stated age of 56.  Vital Signs:  Blood pressure is  186/82, pulse 87, respirations 18.  Vascular:  Her carotid arteries are  without bruits bilaterally.  She is grossly intact neurologically.  Her  radial pulses are 2+.  She has 2+ femoral and 2+ dorsalis pedis pulses  bilaterally.  She does not have any evidence of significant varicosities  and the area of her medial malleolus does not appear to have any  significant varices present.   ASSESSMENT/PLAN:  She underwent repeat carotid duplex today showing 40%  to 60% carotid stenoses in her internal carotid artery bilaterally.  I  reviewed this with the patient.  I explained that she is at essentially  no increased risk for stroke or other neurologic deficit related to her  carotid disease.  I  have recommended yearly ultrasound followup in our  office to rule out any progression.  I reviewed symptoms of carotid  disease with her and she will notify us should this occur.  Otherwise we  will see her again in 1 year with repeat duplex.  I am unclear as to the  etiology of her right ankle discomfort but suspect this is  musculoskeletal since it can be positional as well.   Larina Earthly, M.D.  Electronically Signed   TFE/MEDQ  D:  07/09/2007  T:  07/12/2007  Job:  [id number]   cc:   Theressa Millard, M.D.

## 2011-01-28 NOTE — Procedures (Signed)
CAROTID DUPLEX EXAM   INDICATION:  Follow-up evaluation of known carotid artery disease.   HISTORY:  Diabetes:  Yes.  Cardiac:  No.  Hypertension:  Yes.  Smoking:  No.  Previous Surgery:  No.  CV History:  Patient reports no cerebrovascular symptoms at this time.  Amaurosis Fugax No, Paresthesias No, Hemiparesis No                                       RIGHT             LEFT  Brachial systolic pressure:         180               180  Brachial Doppler waveforms:         Triphasic         Triphasic  Vertebral direction of flow:        Antegrade         Antegrade  DUPLEX VELOCITIES (cm/sec)  CCA peak systolic                   66                75  ECA peak systolic                   236               225  ICA peak systolic                   113               110  ICA end diastolic                   35                41  PLAQUE MORPHOLOGY:                  Calcified         Calcified  PLAQUE AMOUNT:                      Mild-to-moderate  Mild-to-moderate  PLAQUE LOCATION:                    Proximal to mid ICA                 Proximal to mid ICA   IMPRESSION:  1. 40-59% internal carotid artery stenosis bilaterally.  2. Internal carotid arteries are tortuous distally bilaterally.  3. Bilateral external carotid artery stenosis.  4. The distal left internal carotid artery had multiple areas of      elevated velocities distally.  Cannot rule out fibromuscular      dysplasia.  5. No significant change since previous study performed on June 04, 2006.   ___________________________________________  Larina Earthly, M.D.   MC/MEDQ  D:  07/09/2007  T:  07/10/2007  Job:  [id number]

## 2011-01-28 NOTE — Assessment & Plan Note (Signed)
OFFICE VISIT   DUSTY, WAGONER  DOB:  [birth year]                                       01/10/2010  ZOXWR#:[id number]   The patient returns to clinic today for protocol scan of her carotid  arteries.  She has been followed for the past several years.  She states  that recently she had a partial hip replacement due to a fracture of the  ball joint.  She is currently recovering very well from this.  She does  have diabetes and hypertension which are also stable at this time.  She  is single.  She has two children.   REVIEW OF SYSTEMS:  Was significant for weight loss recently and loss of  appetite.   PHYSICAL FINDINGS:  Revealed a very pleasant elderly woman.  Her weight  was 170 pounds.  Her height was 5 feet 1-1/2 inches.  Heart rate was 87.  Blood pressure was 152/73.  She walked with the use of a walker  secondary to recent hip replacement.  She was in no apparent distress.  HEENT:  PERRLA, EOMI, normal conjunctivae.  Lungs were clear to  auscultation.  Cardiac exam revealed regular rate and rhythm.  I did not  appreciate any carotid bruits.  The abdomen was soft, nontender,  nondistended.  Evaluation of the musculoskeletal system demonstrated no  major deformities or cyanosis.  Neurological exam was nonfocal.   Laboratory results demonstrated a stable 40%-59% narrowing of the right  internal carotid artery and a stable 60%-79% narrowing of the left  internal carotid artery.  There was no significant change in the  bilateral ICA velocities since 07/06/2009.   The patient did state that she did have an episode of blurred vision and  weakness on the left side when she was admitted to rehabilitation  services.  This did resolve although she is still having some  difficulties with handwriting according to her daughter.  She was  evaluated at the rehab center and was not felt to have a stroke at that  time.   Her carotid ultrasounds remain stable and we  will follow her up in 6  months.   Wilmon Arms, PA   Janetta Hora. Fields, MD  Electronically Signed   KEL/MEDQ  D:  01/10/2010  T:  01/10/2010  Job:  [id number]

## 2011-01-28 NOTE — Procedures (Signed)
CAROTID DUPLEX EXAM   INDICATION:  Followup known carotid artery disease.   HISTORY:  Diabetes:  Yes.  Cardiac:  No.  Hypertension:  Yes.  Smoking:  No.  Previous Surgery:  None.  CV History:  Amaurosis Fugax No, Paresthesias No, Hemiparesis No                                       RIGHT             LEFT  Brachial systolic pressure:         148               150  Brachial Doppler waveforms:         Biphasic          Biphasic  Vertebral direction of flow:        Antegrade         Antegrade  DUPLEX VELOCITIES (cm/sec)  CCA peak systolic                   74                97  ECA peak systolic                   320               190  ICA peak systolic                   203               197  ICA end diastolic                   65                64  PLAQUE MORPHOLOGY:                  Heterogeneous     Heterogeneous  PLAQUE AMOUNT:                      Moderate          Moderate  PLAQUE LOCATION:                    ICA and ECA       ICA and ECA   IMPRESSION:  1. 60%-79% stenosis noted in the right internal carotid artery.  2. 40%-59% stenosis noted in the left internal carotid artery.  3. Antegrade bilateral vertebral arteries.   ___________________________________________  Larina Earthly, M.D.   MG/MEDQ  D:  07/06/2009  T:  07/07/2009  Job:  [id number]

## 2011-01-28 NOTE — Procedures (Signed)
CAROTID DUPLEX EXAM   INDICATION:  Followup carotid artery disease.   HISTORY:  Diabetes:  Yes.  Cardiac:  No.  Hypertension:  Yes.  Smoking:  No.  Previous Surgery:  No.  CV History:  Asymptomatic.  Amaurosis Fugax No, Paresthesias No, Hemiparesis No                                       RIGHT             LEFT  Brachial systolic pressure:         170               168  Brachial Doppler waveforms:         Normal            Normal  Vertebral direction of flow:        Antegrade         Antegrade  DUPLEX VELOCITIES (cm/sec)  CCA peak systolic                   77                74  ECA peak systolic                   303               193  ICA peak systolic                   179               146  ICA end diastolic                   56                40  PLAQUE MORPHOLOGY:                  Calcific          Calcific  PLAQUE AMOUNT:                      Moderate          Moderate  PLAQUE LOCATION:                    ICA/ECA/CCA       ICA/ECA/CCA   IMPRESSION:  1. A 40-59% stenosis of the bilateral internal carotid arteries.  2. Bilateral external carotid artery stenoses noted.  3. No significant change in the stenotic criteria when compared to the      previous exam on 07/09/2007.   ___________________________________________  Larina Earthly, M.D.   CH/MEDQ  D:  06/30/2008  T:  06/30/2008  Job:  [id number]

## 2011-01-28 NOTE — Procedures (Signed)
CAROTID DUPLEX EXAM   INDICATION:  Carotid disease.   HISTORY:  Diabetes:  Yes.  Cardiac:  No.  Hypertension:  Yes.  Smoking:  No.  Previous Surgery:  No carotid surgery.  CV History:  Patient states she had a blurry vision episode about 1  month ago, currently asymptomatic.  Amaurosis Fugax No, Paresthesias No, Hemiparesis No.                                       RIGHT             LEFT  Brachial systolic pressure:         144               136  Brachial Doppler waveforms:         Normal            Normal  Vertebral direction of flow:        Antegrade         Antegrade  DUPLEX VELOCITIES (cm/sec)  CCA peak systolic                   63                76  ECA peak systolic                   269               169  ICA peak systolic                   184               215  ICA end diastolic                   66                66  PLAQUE MORPHOLOGY:                  Mixed             Mixed  PLAQUE AMOUNT:                      Moderate          Moderate  PLAQUE LOCATION:                    ICA/ECA/CCA       ICA/ECA/CCA   IMPRESSION:  1. 40% to 59% stenosis of the right internal carotid artery.  2. 60% to 79% stenosis of the left internal carotid artery.  3. No significant change in the bilateral internal carotid artery      velocities noted when compared to the previous examination on      07/06/2009.   ___________________________________________  Larina Earthly, M.D.   CH/MEDQ  D:  01/10/2010  T:  01/10/2010  Job:  [id number]

## 2011-01-28 NOTE — Assessment & Plan Note (Signed)
OFFICE VISIT   JENAYAH, ANTU  DOB:  [birth year]                                       06/30/2008  ZOXWR#:[id number]   The patient presents today for continued followup of her asymptomatic  extracranial cerebrovascular occlusive disease and also concern  regarding her right ankle vascular skin changes.  She is a very pleasant  77 year old white female I had seen in the past for asymptomatic carotid  disease.  She is here today for her 1-year followup duplex to look for  any progression.  She specifically denies any neurologic deficits to  include amaurosis fugax, transient ischemic attack, or stroke.  She has  been stable from a cardiac standpoint and her diabetes and hypertension  are under good control.  She has concerns regarding bluish discoloration  over her right medial ankle, more particularly with dependency.   PAST HISTORY:  Significant for non-insulin-dependent diabetes for many  years.  She does have a history of emphysema as well.   FAMILY HISTORY:  Significant for premature atherosclerotic disease in a  sister, father, and mother.   SOCIAL HISTORY:  She is single with 2 children.  She is a retired  Runner, broadcasting/film/video.  She does not smoke, having quit 20 years ago, does not drink  alcohol on a regular basis.   REVIEW OF SYSTEMS:  Positive for reflux, hiatal hernia, trouble  swallowing.  She has an extensive medication list, which is in her  chart.   PHYSICAL EXAM:  Well-developed, well-nourished white female appearing  stated age of 89.  She is grossly intact neurologically.  Blood pressure  is 190/82 in the left arm, 172/82 in the right arm.  Pulse 78,  respirations 18.  Her carotid arteries are without bruits bilaterally.  She has 2+ radial, 2+ femoral, and 2+ posterior tibial pulses  bilaterally.  She does have some scattered telangiectasia and reticular  varicosities over her right medial ankle.  She does not have any  prominent varicosities  above these.   She underwent repeat carotid duplex in our office today and this reveals  no change.  Her stenosis is in the 40-59% range on both internal carotid  arteries.  I discussed this at length with the patient.  I explained  that we would continue to follow her on a yearly basis in our vascular  lab.  I also discussed the significance of the small tributary varices  on her ankle.  There does not appear to be any evidence of severe venous  hypertension, and I reassured her that this was not the same concern  regarding diabetic foot problems.  I explained that her arterial flow is  totally normal and she should not be at any increased risk for tissue  loss due to this.  She was reassured and will see [place] again on an as  needed basis.   Larina Earthly, M.D.  Electronically Signed   TFE/MEDQ  D:  06/30/2008  T:  07/03/2008  Job:  [id number]   cc:   Theressa Millard, M.D.

## 2011-01-31 NOTE — H&P (Signed)
NAMEKEYNA, BLIZARD NO.:  [id number]   MEDICAL RECORD NO.:  [id number]          PATIENT TYPE:  INP   LOCATION:  2899                         FACILITY:  [facility]   PHYSICIAN:  Payton Doughty, M.D.      DATE OF BIRTH:  1934-09-04   DATE OF ADMISSION:  10/11/2004  DATE OF DISCHARGE:                                HISTORY & PHYSICAL   ADMITTING DIAGNOSIS:  Loosening hardware S1.   HISTORY OF PRESENT ILLNESS:  This is a very nice, now 77 year old right-  handed white lady who in October 2004 underwent an L3-4, L4-5, and L5-S1  fusion and really has done well; and developed, over the past few months,  some increasing pain in her back and out toward her hips.  Plain films and  CT demonstrates some loosening of the S1 screw.  She is now admitted for  revision of the screws.   PAST MEDICAL HISTORY:  Her medical history is remarkable for adult-onset  diabetes, depression, and hypertension.   MEDICATIONS:  1.  She takes Avandia 4 mg twice a day.  2.  Glipizide 5 mg twice a day.  3.  Prozac 20 mg a day.  4.  Hydrochlorothiazide 25 mg a day.  5.  Plendil 5 mg a day.  6.  Celebrex 200 mg a day.  7.  Carisoprodol 350 mg q.8h.  8.  Potassium supplement.  9.  Synthroid 0.15 mg a day.  10. Elavil 25 mg at night.  11. Darvocet on a p.r.n. basis.  12. Tylenol on a p.r.n. basis.  13. Lorazepam on a p.r.n. basis.   ALLERGIES:  She is SENSITIVE to DEMEROL in so far as she has hallucinations  in her last hospitalization.   SURGICAL HISTORY:  Right knee cholecystectomy. Cataracts in both eyes.   SOCIAL HISTORY:  Does not smoke, drinks on a limited social basis, and  retired Runner, broadcasting/film/video.   FAMILY HISTORY:  Mom died at 74, dad died at 56 of heart disease.   REVIEW OF SYSTEMS:  Remarkable for glasses, balance disturbance, nose  bleeding, and swelling of her hands and feet, COPD, abdominal pain, arm  weakness, leg weakness, back pain, arm pain, leg pain, joint pain,  arthritis,  difficulty with memory, inability to concentrate, incoordination,  diabetes and thyroid disease.   PHYSICAL EXAMINATION:  HEENT:  Exam within normal limits.  NECK:  Free range of motion of the neck.  CHEST:  Clear.  CARDIAC:  Exam is regular rate and rhythm.  ABDOMEN:  Enlarged but nontender with no hepatosplenomegaly.  EXTREMITIES:  Without clubbing or cyanosis.  GU:  Exam deferred.  EXTREMITIES:  Peripheral pulses are good.  NEUROLOGIC:  She is awake, alert and oriented.  Cranial nerves are intact.  Motor exam demonstrates 5/5 strength throughout the upper and lower  extremities.  She has pain in her back when she has been up and about and  some discomfort out towards her hips with flexion/extension.  CT and plain  films demonstrate loosening of the S1 screws.   CLINICAL IMPRESSION:  Loosening of hardware.  PLANS:  For admission for revision of AS1 screws and placing the bone graft  across the 5-1 interspace.  The risks and benefits of this approach have  been discussed with her and she wishes to proceed.      MWR/MEDQ  D:  10/11/2004  T:  10/11/2004  Job:  [id number]

## 2011-01-31 NOTE — Op Note (Signed)
NAME:  [name] [name], [name] [name]                          ACCOUNT NO.:  [id number]   MEDICAL RECORD NO.:  [id number]                   PATIENT TYPE:  AMB   LOCATION:  DSC                                  FACILITY:  [facility]   PHYSICIAN:  Cindee Salt, M.D.                    DATE OF BIRTH:  August 05, 1934   DATE OF PROCEDURE:  05/01/2004  DATE OF DISCHARGE:                                 OPERATIVE REPORT   PREOPERATIVE DIAGNOSIS:  Carpal tunnel syndrome, right hand.  Stenosing  tenosynovitis, right middle, right ring finger.   POSTOPERATIVE DIAGNOSIS:  Carpal tunnel syndrome, right hand.  Stenosing  tenosynovitis, right middle, right ring finger.   OPERATION PERFORMED:  Release A-1 pulley, right middle, right ring fingers,  release right carpal tunnel.   SURGEON:  Cindee Salt, M.D.   ASSISTANTCarolyne Fiscal.   ANESTHESIA:  Forearm based IV regional.   INDICATIONS FOR PROCEDURE:  The patient is a 77 year old female with a  history of triggering of her right middle and right finger and carpal tunnel  syndrome.  EMG nerve conduction positive.  These have not responded to  conservative treatment.   DESCRIPTION OF PROCEDURE:  The patient was brought to the operating room  where forearm based IV regional anesthetic was carried out without  difficulty.  She was prepped using DuraPrep in supine position, right arm  free.  A longitudinal incision was made in the palm and carried down through  subcutaneous tissue. Bleeders were electrocauterized.  Palmar fascia was  split.  Superficial palmar arch identified. The flexor tendon to the ring  and little finger identified to the ulnar side of the median nerve.  The  carpal retinaculum was incised with sharp dissection.  A right angle and  Sewell retractor were placed  between skin and forearm fascia.  The fascia  was released for approximately 1.5 cm proximal to the wrist crease under  direct vision.  Canal was explored.  Tenosynovial tissue was thickened and  no  lesions were identified.  The wound was irrigated.  A separate incision  was then made obliquely over the A-1 pulley of the middle and A-1 pulley of  the ring finger, carried down through the subcutaneous tissue.  Neurovascular structures protected, bleeders cauterized.  The A-1 pulley was  released on its radial aspect on each finger.  Small incision made centrally  in the A-2 pulley. A  moderate synovitis was present distally.  This was  partially debrided.  Wounds irrigated.  The skin to all wounds was closed  with interrupted 5-0 nylon suture.  Sterile compressive dressing and splint  were applied.  The patient tolerated the procedure well and was taken to the  recovery room for observation in satisfactory condition.  She is discharged  to home to return to the [facility] in one week on [place] [facility].  Cindee Salt, M.D.    GK/MEDQ  D:  05/01/2004  T:  05/01/2004  Job:  [id number]

## 2011-01-31 NOTE — H&P (Signed)
NAME:  [name] [name], [name] [name]                          ACCOUNT NO.:  [id number]   MEDICAL RECORD NO.:  [id number]                   PATIENT TYPE:  INP   LOCATION:  NA                                   FACILITY:  [facility]   PHYSICIAN:  Payton Doughty, M.D.                   DATE OF BIRTH:  Dec 10, 1933   DATE OF ADMISSION:  07/11/2003  DATE OF DISCHARGE:                                HISTORY & PHYSICAL   ADMISSION DIAGNOSES:  1. Lumbar spondylosis.  2. Neurogenic claudication.   SURGEON:  Payton Doughty, M.D.   HISTORY:  This is a very nice 77 year old, left-handed white lady who has  had difficulty with her back for numerous years.  Over the past few months  and couple of years she has had increasing pain in her back, decreasing  ability to walk.  She got her knee replaced 6-8 months ago, and is doing  reasonably well with that.  Has a lot of back pain and complaints of  neurogenic claudication.  The bladder has not been effected.  I visited with  her more in August, and she has severe stenosis, and she is admitted today  for a decompression and fusion at L3-4, L4-5 and L5-S1.   PAST MEDICAL HISTORY:  1. Adult diabetes mellitus.  2. Depression.  3. Hypertension.   CURRENT MEDICATIONS:  1. Avandia 4 mg b.i.d.  2. Glipizide 5 mg b.i.d.  3. Prozac 20 mg daily.  4. Hydrochlorothiazide 25 mg daily.  5. Plendil 5 mg daily.  6. Carisoprodol 350 mg q.8 h.  7. Potassium supplement 20 mEq daily.  8. Synthroid 0.15 mg daily.  9. Elavil 25 mg at night.  10.      Darvocet p.r.n.  11.      Tylenol p.r.n.  12.      Lorazepam p.r.n.   ALLERGIES:  No known drug allergies.   PAST SURGICAL HISTORY:  1. Right knee replacement surgery.  2. Cholecystectomy.  3. Cataract surgery, both eyes.  4. Epidural steroids.   SOCIAL HISTORY:  She does not smoke.  Drinks on a limited social basis.  She  is a retired Runner, broadcasting/film/video.   FAMILY HISTORY:  Her mom died at age 74.  Her daddy died at age 68 with  heart  disease.   REVIEW OF SYSTEMS:  Remarkable for glasses, balance disturbance, nose  bleeding, nausea, swelling of her hands and feet, chronic obstructive  pulmonary disease, abdominal pain and reflux.  Arm weakness, leg weakness,  back pain, arm pain, leg pain, joint pain.  Difficulty with arthritis.  Difficulty with memory.  Inability to concentrate.  Incoordination.  Diabetes and thyroid disease.   PHYSICAL EXAMINATION:  HEENT:  Within normal limits.  NECK:  She has a reasonable range of motion in her neck.  CHEST:  Clear.  HEART:  Regular rate and rhythm.  ABDOMEN:  Nontender.  No hepatosplenomegaly.  EXTREMITIES:  Without clubbing or cyanosis.  GENITALIA:  Examination deferred.  EXTREMITIES:  Peripheral pulses are good.  NEUROLOGICAL:  She is awake, alert and oriented.  Cranial nerves are intact.  Motor examination shows 5/5 strength throughout the upper and lower  extremities.  Sensory deficits described in a stocking distribution.  Reflexes are nonmyelopathic, 2 at the knees, absent at the ankles.  Hoffman's is negative.   DIAGNOSTIC STUDIES:  MRI shows severe stenosis at L3-4 related to  spondylolisthesis and disk.  She has similar findings at L4-5.  L5-S1 does  not look too bad, except for significant degenerative change.   CLINICAL IMPRESSION:  Neurogenic claudication secondary to lumbar  spondylosis.   PLAN:  For a lumbar laminectomy, diskectomy, posterior lumbar interbody  fusion with Ray threaded fusion cages, and pedicle fixation at L3, L4, L5  and S1, with inter-transverse bone grafting.  The risks and benefits of this  approach have been discussed with her, and she wishes to proceed.                                                Payton Doughty, M.D.    MWR/MEDQ  D:  07/11/2003  T:  07/11/2003  Job:  [id number]

## 2011-01-31 NOTE — Op Note (Signed)
NAME:  [name] [name], [name] [name]                          ACCOUNT NO.:  [id number]   MEDICAL RECORD NO.:  [id number]                   PATIENT TYPE:  INP   LOCATION:  NA                                   FACILITY:  [facility]   PHYSICIAN:  Lubertha Basque. Jerl Santos, M.D.             DATE OF BIRTH:  1934-08-31   DATE OF PROCEDURE:  08/22/2002  DATE OF DISCHARGE:                                 OPERATIVE REPORT   PREOPERATIVE DIAGNOSIS:  Right knee degenerative disk disease.   POSTOPERATIVE DIAGNOSIS:  Right knee degenerative disk disease.   PROCEDURE:  Right total knee replacement.   ANESTHESIA:  General and femoral nerve block.   SURGEON:  Lubertha Basque. Jerl Santos, M.D.   ASSISTANT:  Lindwood Qua, P.A.   INDICATIONS FOR PROCEDURE:  The patient is a 77 year old woman with a many  year history of severe right knee pain.  This has permitted despite oral  anti-inflammatories, multiple injections, and the use of a cane.  She has  been left with disabling pain at the rest and with activity, and is offered  a knee replacement operation.  The procedure was discussed with the patient,  and informed operative consent was obtained after discussing the possible  complications of reaction to anesthesia, infection, DVT, PE and death.   DESCRIPTION OF PROCEDURE:  The patient was taken to the operating suite  where general anesthetic was provided without difficulty.  She was  positioned supine and prepped and draped in a normal sterile fashion.  After  administration of IV antibiotics, the right leg was elevated, exsanguinated,  and tourniquet inflated about the thigh.  A longitudinal anterior incision  was made with dissection down to the extensor mechanism.  All appropriate  anti-infective measures were used including intravenous antibiotics,  Betadine impregnanted drape, and closed hooded exhaust systems for each  member of the surgical team.  A medial parapatellar incision was made in the  extensor mechanism.   The knee cap was flipped and the knee flexed to 120  degrees plus.  She had severe degenerative change in the medial compartment  with lesser change lateral. Some residual meniscal tissues were removed and  the ACL and PCL were excised.  She had fair bone quality. Extramedullary  guide was placed and utilized in the tibia to create a cut parallel with the  floor.  A fairly generous cut was made as she did have a slight contracture  preoperatively.  The femur sized to a standard plus component and an  intramedullary guide was placed and utilized to create anterior and  posterior cuts making a flexion gap at about 10 mm.  A second intramedullary  guide was placed in the femur and two created distal to that making an equal  extension gap of 10 mm.  This was set up fairly loose and a medial release  was done along the tibia to address her mild varus deformity.  The  appropriate Chamfer cutting guide was placed and utilized on the femur for a  standard plus.  The tibia sized to a 3 and appropriate guide was placed and  utilized to make a key hole. The trial reduction was done with the  aforementioned component and a 10 mm spacer.  She easily came to full  extension and had good stability throughout her range of motion.  The knee  cap also tracked very well. The knee cap was trapped from 25 to 15 mm of  thickness and a 38 guide was placed and utilized for the [employer]  all polyethylene patella.  No lateral release was required. All trial  components were removed and cement was mixed including the antibiotics.  Pulsatile lavage was used to clean all cut bony surfaces followed by  placement of the cement and the Depuy components which were standard plus  femur, LCS, size 3 tibia, LCS, 10 mm deep dish spacer, LCS, and a 38 mm J &  J patella all polyethylene.  Excess cement was trimmed and pressure was held  on the component until the cement had hardened.  Tourniquet was deflated at  this  point, and a small amount of bleeding was easily controlled with Bovie  cautery. The knee was again irrigated followed by placement a drain exiting  superolaterally.  The extensor mechanism was reapproximated with #1 Vicryl  in an interrupted fashion.  At this point, the knee would flex to 130  degrees.  Subcutaneous tissues were reapproximated with 0 and 2-0 undyed  Vicryl followed by skin closure with staples.  Adaptic was applied to the  wound followed dry gauze and loose ACE wrap.  Estimated blood loss and  tourniquet time can be obtained from anesthesia records.   DISPOSITION:  The patient was extubated in the operating room.  She was  scheduled to have a femoral nerve block placed in the operating room.  She  was then to be taken to the recovery room and will be admitted to the  orthopedic surgical care for appropriate postop care to include  perioperative antibiotics and Coumadin for DVT prophylaxis.                                               Lubertha Basque Jerl Santos, M.D.    PGD/MEDQ  D:  08/22/2002  T:  08/22/2002  Job:  [id number]

## 2011-01-31 NOTE — H&P (Signed)
[name] [name], COMES NO.:  [id number]   MEDICAL RECORD NO.:  [id number]          PATIENT TYPE:  ORB   LOCATION:  4502                         FACILITY:  [facility]   PHYSICIAN:  Ranelle Oyster, M.D.DATE OF BIRTH:  January 01, 1934   DATE OF ADMISSION:  10/16/2004  DATE OF DISCHARGE:                                HISTORY & PHYSICAL   CHIEF COMPLAINT:  Low-back pain.   HISTORY OF PRESENT ILLNESS:  This is a 77 year old white female with history  of right total knee replacement and chronic low-back pain with spondylosis  stenosis requiring fusion in October 2004.  The patient presented over the  last several months with increasing low-back pain.  She had CT done in  January 2006 which revealed loosened S1 screw.  Ultimately, she underwent  revision of her S1 screw with bone graft at L5-S1 by Dr. Channing Mutters.  The patient  is in a TLSO brace currently.  Pain-wise, she says she has been doing well.  The patient was still requiring minimum assist for basic mobility and  transfers as well as gait.  She lives alone and therefore is admitted to  [facility] for further rehab.   PAST MEDICAL HISTORY:  Significant for diabetes, hypertension, depression,  hypothyroidism, right total knee replacement.   REVIEW OF SYSTEMS:  Significant for reflux, low-back pain, weakness,  anxiety, some decreased mood.   FAMILY HISTORY:  Noncontributory.   SOCIAL HISTORY:  The patient lives alone, two-level house, with bedroom  downstairs and three steps to enter.   MEDICATIONS PRIOR TO ADMISSION:  1.  Avandia 4 mg b.i.d.  2.  Glipizide 5 mg b.i.d.  3.  Prozac 20 mg daily.  4.  Hydrochlorothiazide 25 mg daily.  5.  Plendil 5 mg daily.  6.  Soma 350 mg q.8h. p.r.n.  7.  Potassium supplement.  8.  Synthroid 0.15 mg daily.  9.  Elavil 25 mg q.h.s.  10. Ativan p.r.n.   PHYSICAL EXAMINATION:  VITAL SIGNS:  Blood pressure is 149/79, pulse is 86,  temperature is 99, respiratory rate is 18.  She is  saturating 100% in room  air.  GENERAL:  The patient is alert and oriented x3.  HEENT:  Oral mucosa is pink and moist.  Ear, nose, and throat within normal  limits.  NECK:  Supple without JVD or adenopathy.  LUNGS:  Respiratory effort is unlabored and she is clear to auscultation  bilaterally.  HEART:  Regular rate and rhythm.  ABDOMEN:  Soft, nontender.  EXTREMITIES:  Showed no clubbing, cyanosis, and only trace edema.  The  patient's range of motion in lower extremities within normal limits.  NEUROLOGIC:  She has intact sensory function.  Motor function is generally  4+ in the upper extremities, 4/5 in lower extremities.  Reflexes are 2+.  Cognitively, the patient is intact with normal cranial nerve exam and good  judgment, orientation, and memory.   ASSESSMENT:  1.  Lumbar laminectomy/fusion revision at the L5-S1.  2.  Deep vein thrombosis prophylaxis.  3.  Hypertension.  4.  Non-insulin-requiring diabetes.   PLAN:  1.  Being inpatient subacute rehab.  Goals are modified independent.      Estimated length of stay is 7+ days.  2.  Pain management with oxycodone p.r.n. as well as Elavil 25 mg q.h.s.  3.  DVT prophylaxis with TED hose.  4.  Avandia and Glucotrol for blood sugar control.  5.  Hydrochlorothiazide and Plendil for blood pressure control.  6.  Expect the patient to do quite nicely here.      ZTS/MEDQ  D:  10/16/2004  T:  10/16/2004  Job:  [id number]

## 2011-01-31 NOTE — Discharge Summary (Signed)
   NAME:  [name], [name]                          ACCOUNT NO.:  [id number]   MEDICAL RECORD NO.:  [id number]                   PATIENT TYPE:  INP   LOCATION:  5007                                 FACILITY:  [facility]   PHYSICIAN:  Lindwood Qua, P.A.              DATE OF BIRTH:  1933-11-15   DATE OF ADMISSION:  08/22/2002  DATE OF DISCHARGE:  08/25/2002                                 DISCHARGE SUMMARY   ADMISSION DIAGNOSES:  1. Right knee end stage degenerative joint disease.  2. Hypertension.  3. History of diabetes mellitus type II.  4. Hypothyroidism.   DISCHARGE DIAGNOSES:  1. Right knee end stage degenerative joint disease.  2. Hypertension.  3. History of diabetes mellitus type II.  4. Hypothyroidism.   OPERATION:  Right total knee replacement.   HISTORY OF PRESENT ILLNESS:  Ms. [name] [name] is a 77 year old white female who is  a patient well known to our practice. She has end stage degenerative joint  disease in her knee, right side, on x-ray. She is having pain with every  step and trouble sleeping at night time. Not relieved by oral anti-  inflammatory agents or corticosteroid injections and she will undergo a  total knee replacement.   LABORATORY DATA:  Last hemoglobin and hematocrit 10.3 and 30. She was on low  dose Coumadin protocol and INR's were drawn serially.   HOSPITAL COURSE:  She was admitted postoperative and placed on a variety of  by mouth analgesics for pain. Pharmacy per Coumadin protocol. PT for out of  bed and weight bearing as tolerated. Dressings and drains pulled the second  day postoperative day. Wounds were benign throughout her hospital stay. She  was a candidate for the rehabilitative unit and they did take her over  there. We continued to follow her. Staples will be removed in two weeks. In  the postoperative period, she remained on low dose Coumadin protocol for  four weeks.                                                Lindwood Qua,  P.A.    MC/MEDQ  D:  10/01/2002  T:  10/02/2002  Job:  [id number]

## 2011-01-31 NOTE — Discharge Summary (Signed)
NAME:  [name] [name], [name] [name]                          ACCOUNT NO.:  [id number]   MEDICAL RECORD NO.:  [id number]                   PATIENT TYPE:  INP   LOCATION:  3005                                 FACILITY:  [facility]   PHYSICIAN:  Hewitt Shorts, M.D.            DATE OF BIRTH:  1933/11/14   DATE OF ADMISSION:  07/11/2003  DATE OF DISCHARGE:  07/22/2003                                 DISCHARGE SUMMARY   HISTORY OF PRESENT ILLNESS:  The patient is a 77 year old woman who is under  the care of Payton Doughty, M.D.  She presented with symptoms of neurogenic  claudication and was admitted for decompression and arthrodesis.  Details of  her admission history and physical examination are included in Dr. Temple Pacini  admission note.   HOSPITAL COURSE:  The patient was admitted, underwent an L3-4, L4-5, and L5-  S1 lumbar laminectomy and diskectomy and L3-4 and L4-5 posterior lumbar  interbody fusion with Ray cages and an L3 to S1 posterior lateral  arthrodesis with posterior instrumentation.  Following surgery, the patient  went through standard postoperative care, physical therapy, and occupational  therapy.  Consultations were obtained.  In speaking with the patient today  and in reviewing the chart, the patient apparently had difficulty during the  early postoperative period with auditory hallucinations which were  intermixed with dreams which were distressing to her. Medications were  adjusted and her sleep has been much improved and the auditory  hallucinations have significantly diminished.  Dr. Channing Mutters consulted [name], M.D. from psychiatry.  After waiting several days for that  consultation, the patient feels that her condition has stabilized and she  and her daughters feel that they no longer want to wait for that psychiatric  consultation and have requested discharge to home.  At this point, she is  requiring only occasional Darvocet for pain.  She is up and ambulating with  a rolling  walker wearing an Aspen lumbar brace.   In speaking with the patient at length today, on the day of discharge.  She  explains that she had similar symptoms following a total knee replacement.  She does complain of some forgetfulness and is concerned about the  possibility of early dementia, but explains that she has not undergone any  workup for that.   I suspect that the auditory hallucinations may have been contributed to by  the stress of surgery, anesthesia, postoperative narcotic analgesics, and  sleep deprivation as well as changes in surroundings and settings and as her  condition is stabilized and as she has required less medications and is  further out from anesthesia, the condition has nearly fully resolved.   I have recommended to the patient and she has assured me that she will  follow through on this, that she consult with her primary physician, Theressa Millard, M.D., regarding both her concerns about the possibility of early  dementia as well as her repeated difficulty following anesthesia and major  surgeries with auditory hallucinations and she has agreed to see him within  the next one to two weeks following discharge.   Dr. Channing Mutters has gone ahead and removed the patient's sutures and we have given  the patient instructions regarding wound care and activities.  She is to  return to see Dr. Channing Mutters in a couple of weeks for follow-up.  She was given a  prescription for Darvocet-N 100 one or two tablets p.o. q.i.d. p.r.n. pain  40 tablets, no refills.    DISCHARGE DIAGNOSES:  1. Lumbar spondylosis.  2. Postoperative auditory hallucinations, resolved.                                                Hewitt Shorts, M.D.    RWN/MEDQ  D:  07/22/2003  T:  07/22/2003  Job:  [id number]   cc:   Theressa Millard, M.D.  [address] [place]  [place] [address]  Fax: [phone]

## 2011-01-31 NOTE — Discharge Summary (Signed)
[name] [name], [name] [name] NO.:  [id number]   MEDICAL RECORD NO.:  [id number]          PATIENT TYPE:  ORB   LOCATION:  4502                         FACILITY:  [facility]   PHYSICIAN:  Erick Colace, M.D.DATE OF BIRTH:  1934-07-03   DATE OF ADMISSION:  10/16/2004  DATE OF DISCHARGE:  10/19/2004                                 DISCHARGE SUMMARY   DISCHARGE DIAGNOSES:  1.  Revision of lumbar L5-S1 laminectomy with instrumentation and fusion on      October 11, 2004, per Dr. Channing Mutters.  2.  Pain management.  3.  Hypertension.  4.  Non-insulin dependent diabetes mellitus.  5.  Depression.  6.  Hypothyroidism.  7.  Right total knee placed in December 2003.   HISTORY OF PRESENT ILLNESS:  This is a 77 year old white femaleemale with a  history of right total knee replacement in December 2003, of which she  received inpatient rehab services for, as well as a lumbar L3-4 and L5-S1  fusion in October 2004.  She was now admitted on October 11, 2004, with  increased low back pain.  CT scan of the back showed loosening of S1 screw.  She underwent revision of S1 screws, bone grafting of L5-S1 on October 11, 2004, per Dr. Channing Mutters.  Fitted with a back brace.  Pain management ongoing with  PCA, discontinued on October 13, 2004.  Foley catheter removed on October 13, 2004.  She was admitted to subacute care services.   PAST MEDICAL HISTORY:  See discharge diagnoses.   ALLERGIES:  VICODIN.   HABITS:  Rare alcohol, remote tobacco use.   SOCIAL HISTORY:  She lives alone in [place].  Two level home, bedroom  downstairs, three steps to entry.  Local family works.  She was independent  with a walker prior to admission.   MEDICATIONS:  1.  Avandia 4 mg b.i.d.  2.  Glipizide 5 mg b.i.d.  3.  Prozac 20 mg daily.  4.  Hydrochlorothiazide 25 mg daily.  5.  Plendil 5 mg daily.  6.  Potassium chloride.  7.  Synthroid 0.5 mg daily.  8.  Elavil 25 mg at bedtime.  9.  Lorazepam p.r.n.   HOSPITAL COURSE:  The patient with progressive gains while in rehab services  with therapies initiated daily.  The following issues were followed during  the patient's rehab course.  Pertaining to Ms. Lipsitz's revision of lumbar L5-  S1 with instrumentation and fusion, surgical site healing nicely, area was  clean, dry, and intact.  She would follow up with Dr. Trey Sailors in one week  for removal of sutures.  She was advised to call if any increased redness,  drainage, or fever.  She had been fitted with a back brace with advise to be  on when out of bed.  Pain management ongoing with the use of oxycodone and  Elavil with good results.  She had thigh-high stockings on for deep venous  thrombosis prophylaxis.  Her calves remained cool without any swelling,  erythema, and nontender.  Her blood sugars overall were controlled with  Avandia and Glucotrol.  She would follow up with Dr. Earl Gala.  She had a  documented history of depression.  She continued on Prozac.  She was  participating fully with her therapies, motivated, resting well at night,  with a good appetite.  She remained on her hormone supplement for her  hypothyroidism.  She had been in rehab services in December 2003, for a  right total knee replacement which was not an issue during her rehab stay.   Overall functionally, she was supervision for bed mobility and transfers,  ambulating 100 feet with a rolling walker and navigating stairs with  supervision, simple set up for activities of daily living.  She was  discharged to home on home therapies.   LABORATORY DATA:  Sodium 139, potassium 3.6, BUN 16, creatinine 0.9,  hemoglobin 11.5, hematocrit 33.2.   DISCHARGE MEDICATIONS:  1.  Synthroid 150 mcg daily.  2.  Prozac 20 mg daily.  3.  Avandia 8 mg daily.  4.  Glucotrol 5 mg b.i.d.  5.  Hydrochlorothiazide 25 mg daily.  6.  Plendil 5 mg daily.  7.  Elavil 25 mg at bedtime.  8.  Oxycodone p.r.n. pain.   ACTIVITY:  Back brace  when out of bed.   DIET:  Diabetic diet.   WOUND CARE:  Follow up with Dr. Trey Sailors in one week for removal of sutures.  Home health physical therapy and occupational therapy.  She was advised no  driving at this time.   FOLLOWUP:  Follow up with Dr. Earl Gala for medical management.      DA/MEDQ  D:  10/18/2004  T:  10/18/2004  Job:  [id number]   cc:   Payton Doughty, M.D.  [address] [place]  [place] [address]  Fax: [phone]   Theressa Millard, M.D.  [address] [place]  [place] [address]  Fax: [phone]

## 2011-01-31 NOTE — Discharge Summary (Signed)
NAME:  [name] [name], [name] [name]                          ACCOUNT NO.:  [id number]   MEDICAL RECORD NO.:  [id number]                   PATIENT TYPE:  IPS   LOCATION:  4149                                 FACILITY:  [facility]   PHYSICIAN:  Erick Colace, M.D.           DATE OF BIRTH:  [birth year]   DATE OF ADMISSION:  08/25/2002  DATE OF DISCHARGE:  09/01/2002                                 DISCHARGE SUMMARY   DISCHARGE DIAGNOSES:  1. Right total knee arthroplasty secondary to degenerative joint disease.  2. History of diabetes mellitus.  3. History of thyroid disease.  4. History of hypertension.  5. History of diabetic neuropathy.  6. Hypokalemia, resolved.  7. Anemia.   HISTORY OF PRESENT ILLNESS:  The patient is a 77 year old white female with  past medical history of hypertension, emphysema, and diabetes mellitus  admitted on 08/22/2002 for right total knee arthroplasty secondary to DJD by  Dr. Jerl Santos.  The patient was placed on Coumadin for DVT prophylaxis.  PT  report at this time indicated that the patient was ambulating about 25 feet  with standard walker, moderate assistance, and weightbearing as tolerated.  The patient was transferring sit to stand moderate assistance.  Hospital  course significant for anemia and neuropathy, pain.  The patient was  transferred to [facility] on 08/25/2002.   PAST MEDICAL HISTORY:  1. As above.  2. Neuropathy of bilateral extremities.  3. Thyroid disease.  4. Osteoarthritis.   PAST SURGICAL HISTORY:  1. Carpal tunnel.  2. Gallbladder.  3. Cataracts.   PRIMARY CARE PHYSICIAN:  Theressa Millard, M.D.   SOCIAL HISTORY:  The patient lives in a two-level home alone in [place]  with five steps to entry.  Independent prior to admission.  Daughter  provides minimal assistance.  She is a divorced, retired Engineer, site.  She is fairly active and denies any tobacco or alcohol abuse.   ALLERGIES:  No known drug  allergies.   REVIEW OF SYSTEMS:  Positive for reflux, joint pain, decreased sensation in  legs, and memory deficits.   HOSPITAL COURSE:  The patient was admitted to [facility]  Department on 08/25/2002 for comprehensive inpatient rehabilitation and  received more than 3 hours of therapy daily.  Hospital course was  significant for the following.   1. RIGHT TOTAL KNEE ARTHROPLASTY:  Overall from a rehabilitation standpoint,     the patient made good progress.  She was modified independent and was     ambulating greater than 100 feet with rolling walker.  The patient was     able to tolerate therapies, and the patient's pain was controlled with     oxycodone p.r.n.  The patient remained on Coumadin throughout entire stay     for DVT prophylaxis.  The patient was started on Lovenox until INR was     greater than 2; Lovenox was discontinued on 08/25/2002.  Surgical     incision healed very well, about 1+ edema, no redness noted or drainage.     The patient's staples were removed prior to discharge per Dr. Jerl Santos.     Incision demonstrated no signs of infection at the time of discharge.   1. HISTORY OF DIABETES MELLITUS:  The patient's sugars were not controlled     while in rehabilitation.  Adjustments were made in her diabetic     medication.  Glucotrol was advanced from 10 mg b.i.d. to 15 mg b.i.d.     Unfortunately, sugars still remained in the upper 200s despite diet     modification.  The patient remained on an 1800 calorie ADA diet     throughout her entire stay on rehabilitation.  She received sliding scale     insulin as needed.  She also remained on Avandia 4 mg p.o. b.i.d.     throughout her stay.  The patient will follow up with her primary care     Mana Haberl for any elevation in CBGs.   1. HISTORY OF HYPERTENSION:  Blood pressure remained under fair control     while on rehabilitation.  No adjustment was made in medications.  She     remained on  hydrochlorothiazide 25 mg p.o. q.d. and Plendil 5 mg p.o.     q.d. as well.   1. HYPOKALEMIA:  Admission labs demonstrated the patient had a potassium     level of 3.0.  On 08/26/2002, she received K-Dur supplement.  With the     addition of K-Dur, the patient's potassium level stabilized, and     potassium was discontinued at time of discharge.   1. NEUROLOGY:  The patient had memory deficits throughout her stay on     rehabilitation.  The patient had a past medical history of this and was     to follow up with her primary care Colin Norment concerning this.  She also     had Dr. Leonides Cave, psychology, to evaluate her prior to discharge.   1. HISTORY OF THYROID DISEASE:  She remained on Synthroid 150 mcg throughout     entire stay.   No other major issues occurred while in rehabilitation.  The patient  received flu vaccine prior to discharge on 08/31/2002 per patient request.   Latest labs indicate INR was 2.5.  Latest hemoglobin was 10.0, hematocrit  28.5, white blood cell count 6.4, platelet count 263.  Sodium 141, potassium  4.0, chloride 102, CO2 31, glucose 209, BUN 11, creatinine 0.8.  AST 15, ALT  17.   At time of discharge, vital signs showed blood pressure 158/84, respiratory  rate 20, pulse 80, temperature 98.8.  CBGs were running 219, 218, 242, 236,  and 234.  PT report indicated patient was ambulating modified independent  greater than 100 feet with rolling walker.  She could transfer sit to stand  modified independent.  She has 70 degrees flexion in her right knee. She was  able to perform ADLs modified independently.  The patient was discharged  home with her daughter.   DISCHARGE MEDICATIONS:  1. Avandia 4 mg twice daily.  2. Allegra 1 tablet daily.  3. Synthroid 150 mcg daily.  4. Plendil 5 mg daily.  5. HCTZ 25 mg daily.  6. Coumadin 4 mg every afternoon until 09/22/2002 per Dr. Jerl Santos.  7. Multivitamins with iron 1 tablet daily. 8. Glucotrol 15 mg 1 tablet twice  daily.  9. Oxycodone 5 to 10 mg every  4 to 6 hours as needed for pain.   No aspirin or ibuprofen while on Coumadin.  Pain management with oxycodone  and Tylenol.   ACTIVITY:  No drinking, no driving, use walker.   DIET:  No concentrated sweets.  Check CBGs at least three times daily and  record results and time.  Eat bananas and foods rich in potassium.   SPECIAL INSTRUCTIONS:  [facility] for PT, OT, INR, and RN to  monitor INR and PT.  First draw will be Monday, 09/05/2002.  She is to  follow up with Dr. Jerl Santos within two weeks, follow up with Dr. Theressa Millard, her primary care Jahkeem Kurka, in two weeks about CBGs and memory  deficits.  Follow up with Dr. Claudette Laws as needed.      Junie Bame, P.A.                       Erick Colace, M.D.    LH/MEDQ  D:  09/01/2002  T:  09/02/2002  Job:  [id number]   cc:   Lubertha Basque. Jerl Santos, M.D.  [address]  [place]  [place] [address]  Fax: [phone]   Theressa Millard, M.D.  [address] [place]  [place] [address]  Fax: [phone]

## 2011-01-31 NOTE — Op Note (Signed)
NAME:  [name] [name], [name] [name]                          ACCOUNT NO.:  [id number]   MEDICAL RECORD NO.:  [id number]                   PATIENT TYPE:  INP   LOCATION:  3005                                 FACILITY:  [facility]   PHYSICIAN:  Payton Doughty, M.D.                   DATE OF BIRTH:  27-Jul-1934   DATE OF PROCEDURE:  07/11/2003  DATE OF DISCHARGE:                                 OPERATIVE REPORT   PREOPERATIVE DIAGNOSIS:  Spondylosis at L3-4, L4-5, and L5-S1.   POSTOPERATIVE DIAGNOSIS:  Spondylosis at L3-4, L4-5, and L5-S1 with  transitional anatomy at L5-S1.   SURGEON:  Payton Doughty, M.D.   ANESTHESIA:  General endotracheal.   PREPARATION:  Prepped with sterile Betadine prep and scrubbed with alcohol  wipe.   COMPLICATIONS:  None.   PROCEDURE:  L3-4, L4-5, and L5-S1 laminectomy and diskectomy, L3-4 and L4-5  posterior lumbar interbody fusion with Ray threaded fusion cages, L3 to S1  segmental pedicle screws with posterolateral arthrodesis.   ASSISTANT:  Chief Strategy Officer; Basilia Jumbo.  Doctor assistant; Hilda Lias,  M.D.   INDICATIONS FOR PROCEDURE:  A 77 year old lady with severe lumbar  spondylosis and neurogenic claudication.   DESCRIPTION OF PROCEDURE:  The patient was taken to the operating room,  smoothly anesthetized and intubated, placed prone on the operating table.  Following shave, prepped and draped in the usual sterile fashion.  The skin  was infiltrated with 1% lidocaine with 1:400,000 epinephrine.  The skin was  incised from mid L2 to mid S1 in the lamina and transverse processes of L3,  L4, L5, and the sacral ala were exposed bilaterally.  Subperiosteal plane  intraoperative x-ray confirmed correctness of level.  The pars and  articularis lamina and inferior facet of L3, L4, and L5 were removed as well  as the superior facet of L4, L5, and S1 bilaterally.  At L3-4, there was  significant spinal stenosis and posterolateral stenosis.  This was relieved  by removal  of ligamentum flavum and facet joints.  At L4-5, there was a slip  of approximately grade I to grade I+ with significant compression of the L4  roots bilaterally. This was also relieved with removal of the ligamentum  flavum, removal of facet joints, and reduction of the slip.  At L5-S1 there  was transitional anatomy with the L5 root lying directly over the L5-S1 disk  space coming off almost straight laterally from the thecal sac.  Following  complete decompression, 12 x 21 mm Ray threaded fusion cages were placed at  3-4 and 4-5.  5-1 was not possible because of the anatomy as described  above.  Pedicle screws were then placed at L3, L4, L5, and S1 using the  standard landmarks.  Intraoperative x-ray showed good placement of screws.  The cages were packed with bone harvested from facet joints.  The pedicle  screws were connected  using the rods and capped.  The caps were tightened.  The wound was irrigated and hemostasis assured.  The sacral ala and  transverse processes were decorticated and intertransverse posterolateral  arthrodesis was done using DBX bone graft  and V-toss.  The entire wound was closed with 0 Vicryl in interrupted  fashion, 3-0 Vicryl in interrupted fashion, and 3-0 nylon in a running  locking fashion.  Betadine and Telfa dressing was applied and made occlusive  with Op-Site. The patient returned to the recovery room in good condition.                                               Payton Doughty, M.D.    MWR/MEDQ  D:  07/11/2003  T:  07/11/2003  Job:  [id number]

## 2011-01-31 NOTE — Op Note (Signed)
NAME:  [name] [name], [name] [name]                          ACCOUNT NO.:  [id number]   MEDICAL RECORD NO.:  [id number]                   PATIENT TYPE:  AMB   LOCATION:  ENDO                                 FACILITY:  [facility]   PHYSICIAN:  Danise Edge, M.D.                DATE OF BIRTH:  [birth year]   DATE OF PROCEDURE:  05/13/2004  DATE OF DISCHARGE:                                 OPERATIVE REPORT   PROCEDURE:  Colonoscopy with polypectomy.   PROCEDURE INDICATION:  Ms. Amairany Schumpert is a 77 year old woman born February 18, 1934.  Ms. Aye is scheduled to undergo her first screening colonoscopy  with polypectomy to prevent colon cancer.   ENDOSCOPIST:  Danise Edge, M.D.   PREMEDICATION:  Versed 7 mg, Demerol 60 mg.   PROCEDURE:  After obtaining informed consent, Ms. Ceballos was placed in the  left lateral decubitus position.  I administered intravenous Demerol and  intravenous Versed to achieve conscious sedation for the procedure.  The  patient's blood pressure, oxygen saturation, and cardiac rhythm were  monitored throughout the procedure and documented in the medical record.   Anal inspection and digital rectal exam were normal.  The Olympus adjustable  pediatric colonoscope was introduced into the rectum and advanced to the  cecum.  Colonic preparation for the exam today was excellent.   Rectum normal.   Sigmoid colon and descending colon:  From the distal sigmoid colon at 35 cm  from the anal verge, a 2 mm sessile polyp was removed with the hot biopsy  forceps.   Splenic flexure normal.   Transverse colon normal.   Hepatic flexure normal.   Ascending colon normal.   Cecum and ileocecal valve normal.   ASSESSMENT:  A small polyp was removed from the distal sigmoid colon;  otherwise, normal screening proctocolonoscopy to the cecum.                                               Danise Edge, M.D.    MJ/MEDQ  D:  05/13/2004  T:  05/13/2004  Job:  [id number]   cc:   Theressa Millard, M.D.  [address] [place]  [place] [address]  Fax: [phone]

## 2011-01-31 NOTE — Op Note (Signed)
NAMEJAIA, ALONGE NO.:  [id number]   MEDICAL RECORD NO.:  [id number]          PATIENT TYPE:  AMB   LOCATION:  ENDO                         FACILITY:  [facility]   PHYSICIAN:  Danise Edge, M.D.   DATE OF BIRTH:  [birth year]   DATE OF PROCEDURE:  08/12/2004  DATE OF DISCHARGE:                                 OPERATIVE REPORT   PROCEDURE PERFORMED:  Colonoscopy.   ENDOSCOPIST:  Charna Elizabeth, M.D.   INSTRUMENT USED:  Olympus video colonoscope.   INDICATIONS FOR PROCEDURE:  Ms. [name] [name] is a 77 year old female born  [birth year].  [name] has suspected gastroesophageal reflux associated  with nausea and upper abdominal discomfort.  She was switched from  famotidine to a proton pump inhibitor.  She does take aspirin 81 mg on a  daily basis.  Upper endoscopy is performed to rule out esophagitis,  Barrett's esophagus and peptic ulcer disease.   ENDOSCOPIST:  Charolett Bumpers, M.D.   PREMEDICATION:  Versed 4 mg, Demerol 40 mg.   DESCRIPTION OF PROCEDURE:  After obtaining informed consent, [name] was  placed in the left lateral decubitus position.  I administered intravenous  Demerol and intravenous Versed to achieve conscious sedation for the  procedure.  The patient's blood pressure, oxygen saturations and cardiac  rhythm were monitored throughout the procedure and documented in the medical  record.   The Olympus gastroscope was passed through the posterior hypopharynx and the  proximal esophagus without difficulty.  The hypopharynx, larynx and vocal  cords appeared normal.   Esophagoscopy:  The proximal, mid and lower segments of the esophageal  mucosa appeared normal.  The squamocolumnar junction and esophagogastric  junction were noted at 40 cm from the incisor teeth.  There was no  endoscopic evidence of the presence of erosive esophagitis, esophageal  mucosal scarring or Barrett's esophagus.   Gastroscopy:  Retroflex view of the gastric cardia  and fundus was normal.  The gastric body, antrum and pylorus appeared completely normal.  There is  no endoscopic evidence for the presence of peptic ulcer disease.   Duodenoscopy:  The duodenal bulb and descending duodenum appear normal.   ASSESSMENT:  Normal esophagogastroduodenoscopy.  No endoscopic evidence for  the presence of erosive esophagitis, esophageal mucosal scarring, Barrett's  esophagus or peptic ulcer disease.      MJ/MEDQ  D:  08/12/2004  T:  08/12/2004  Job:  [id number]   cc:   [name] [name], M.D.  [address] [place]  [place] [address]  Fax: [phone]

## 2011-01-31 NOTE — Op Note (Signed)
NAMEOLUKEMI, PANCHAL NO.:  [id number]   MEDICAL RECORD NO.:  [id number]          PATIENT TYPE:  INP   LOCATION:  2899                         FACILITY:  [facility]   PHYSICIAN:  Payton Doughty, M.D.      DATE OF BIRTH:  [birth year]   DATE OF PROCEDURE:  10/11/2004  DATE OF DISCHARGE:                                 OPERATIVE REPORT   PREOPERATIVE DIAGNOSIS:  Loosening of S1 screws.   POSTOPERATIVE DIAGNOSIS:  Loosening of S1 screws.   PROCEDURE:  Revision of S1 screws and revision of intertransverse bone graft  at L5-S1.   SURGEON:  Payton Doughty, M.D.   ANESTHESIA:  General endotracheal.   PREP:  Prepped with sterile Betadine prep and scrubbed with alcohol wipe.   COMPLICATIONS:  None.   ASSISTANT:  Nurse assistant was [place].  Doctor assistant was Clydene Fake, M.D.   INDICATIONS FOR PROCEDURE:  A 77 year old lady has a fusion from L3 to S1  with loosening of the S1 screws.   DESCRIPTION OF PROCEDURE:  She was taken to the operating room, smoothly  anesthetized, and intubated.  Placed prone on the operating table.  Following shave, prepped and draped in the usual sterile fashion.  The old  skin incision was reopened and the construct from L3 to S1 was exposed  bilaterally.  At L5, all of the caps were removed and the rod was removed.  The S1 screws were removed bilaterally.  They were 5.25 mm screws and they  were replaced by 7.25 mm screws.  There was a very tight fit on both of  them.  The rod was replaced bilaterally and the tightened and the caps  tightened.  The transverse process of L5 and the sacral ala were both  exposed widely lateral to the screws.  On the left side, there was actually  bone graft growing, but it was of marginal quality.  It was all drilled down  to cancellous bone and DBX mixed with allograft was placed over that in  generous portions.  The wound was previously irrigated and hemostasis was  assured.  Intraoperative x-ray  showed good placement of the screws and rod  replacement.  The fascia was reapproximated with 0 Vicryl in interrupted  fashion, the subcutaneous tissue was reapproximated with 0 Vicryl in  interrupted fashion, the subcuticular tissue was reapproximated with 3-0  Vicryl in interrupted fashion.  The skin was closed with 3-0 nylon in  running locking fashion.  Betadine and Telfa dressing was applied and made  occlusive with Op-Site.  The patient returned to the recovery room in good  condition.    MWR/MEDQ  D:  10/11/2004  T:  10/11/2004  Job:  [id number]

## 2011-01-31 NOTE — Discharge Summary (Signed)
[name], [name] NO.:  [id number]   MEDICAL RECORD NO.:  [id number]          PATIENT TYPE:  INP   LOCATION:  3001                         FACILITY:  [facility]   PHYSICIAN:  Payton Doughty, M.D.      DATE OF BIRTH:  10/09/1933   DATE OF ADMISSION:  10/11/2004  DATE OF DISCHARGE:  10/16/2004                                 DISCHARGE SUMMARY   ADMITTING DIAGNOSIS:  Loosening hardware, sacral 1.   DISCHARGE DIAGNOSIS:  Loosening hardware, sacral 1.   OPERATION PERFORMED:  Replacement of sacral 1 hardware.   HOSPITAL COURSE:  This is a 77 year old right-handed white lady whose  history and physical is recounted in the chart.  She had a fusion in October  2004 and did well.  She has loosening of her S1 screws and is admitted for  replacement.   Medical history is remarkable for diabetes, depression and hypertension.   The patient had an intact exam with back pain.   The patient was admitted and after ascertaining laboratory values she  underwent replacement of the S1 screws and posterior intertransverse  grafting.  Postoperatively she did well.  She was kept in the ICU for on e  night because of her diabetes and hypertension.  She was then transferred  out to the floor where she began rehabilitation.  It was felt after physical  therapy visited with her that she may benefit from a stay in the rehab unit;  and, pursuant to that they were consulted.  She was transferred to rehab on  October 16, 2004.   FOLLOW UP:  Follow up will be at the [facility]  Office in about a week for suture removal.      MWR/MEDQ  D:  01/03/2005  T:  01/04/2005  Job:  [id number]

## 2011-02-18 ENCOUNTER — Other Ambulatory Visit (HOSPITAL_COMMUNITY): Payer: Self-pay | Admitting: Orthopedic Surgery

## 2011-02-18 ENCOUNTER — Encounter (HOSPITAL_COMMUNITY)
Admission: RE | Admit: 2011-02-18 | Discharge: 2011-02-18 | Disposition: A | Discharge status: Home / Self Care | Payer: Medicare Other | Source: Ambulatory Visit | Attending: Orthopedic Surgery | Admitting: Orthopedic Surgery

## 2011-02-18 DIAGNOSIS — Z01810 Encounter for preprocedural cardiovascular examination: Secondary | ICD-10-CM | POA: Insufficient documentation

## 2011-02-18 DIAGNOSIS — Z01812 Encounter for preprocedural laboratory examination: Secondary | ICD-10-CM | POA: Insufficient documentation

## 2011-02-18 DIAGNOSIS — I10 Essential (primary) hypertension: Secondary | ICD-10-CM

## 2011-02-18 DIAGNOSIS — Z01818 Encounter for other preprocedural examination: Secondary | ICD-10-CM | POA: Insufficient documentation

## 2011-02-18 LAB — TYPE AND SCREEN: Antibody Screen: NEGATIVE

## 2011-02-18 LAB — COMPREHENSIVE METABOLIC PANEL
ALT: 13 U/L (ref 0–35)
Albumin: 3.4 g/dL — ABNORMAL LOW (ref 3.5–5.2)
Alkaline Phosphatase: 97 U/L (ref 39–117)
BUN: 21 mg/dL (ref 6–23)
Chloride: 105 mEq/L (ref 96–112)
Glucose, Bld: 186 mg/dL — ABNORMAL HIGH (ref 70–99)
Potassium: 4.4 mEq/L (ref 3.5–5.1)
Sodium: 142 mEq/L (ref 135–145)
Total Bilirubin: 0.2 mg/dL — ABNORMAL LOW (ref 0.3–1.2)
Total Protein: 6.8 g/dL (ref 6.0–8.3)

## 2011-02-18 LAB — PROTIME-INR
INR: 0.99 (ref 0.00–1.49)
Prothrombin Time: 13.3 seconds (ref 11.6–15.2)

## 2011-02-18 LAB — URINALYSIS, ROUTINE W REFLEX MICROSCOPIC
Bilirubin Urine: NEGATIVE
Glucose, UA: 100 mg/dL — AB
Hgb urine dipstick: NEGATIVE
Ketones, ur: NEGATIVE mg/dL
Nitrite: NEGATIVE
Protein, ur: NEGATIVE mg/dL
Specific Gravity, Urine: 1.026 (ref 1.005–1.030)
Urobilinogen, UA: 0.2 mg/dL (ref 0.0–1.0)
pH: 5.5 (ref 5.0–8.0)

## 2011-02-18 LAB — DIFFERENTIAL
Basophils Absolute: 0 10*3/uL (ref 0.0–0.1)
Eosinophils Relative: 4 % (ref 0–5)
Lymphocytes Relative: 26 % (ref 12–46)
Neutro Abs: 4.5 10*3/uL (ref 1.7–7.7)

## 2011-02-18 LAB — SURGICAL PCR SCREEN: MRSA, PCR: POSITIVE — AB

## 2011-02-18 LAB — CBC
HCT: 36.7 % (ref 36.0–46.0)
Platelets: 260 10*3/uL (ref 150–400)
RDW: 12.7 % (ref 11.5–15.5)
WBC: 7.4 10*3/uL (ref 4.0–10.5)

## 2011-02-24 ENCOUNTER — Inpatient Hospital Stay (HOSPITAL_COMMUNITY): Admission: RE | Admit: 2011-02-24 | Payer: Medicare Other | Source: Ambulatory Visit | Admitting: Orthopedic Surgery

## 2011-03-05 ENCOUNTER — Ambulatory Visit
Admission: RE | Admit: 2011-03-05 | Discharge: 2011-03-05 | Disposition: A | Discharge status: Home / Self Care | Payer: Medicare Other | Source: Ambulatory Visit | Attending: Cardiology | Admitting: Cardiology

## 2011-03-05 ENCOUNTER — Other Ambulatory Visit: Payer: Self-pay | Admitting: Cardiology

## 2011-03-05 DIAGNOSIS — I119 Hypertensive heart disease without heart failure: Secondary | ICD-10-CM

## 2011-03-13 ENCOUNTER — Inpatient Hospital Stay (HOSPITAL_BASED_OUTPATIENT_CLINIC_OR_DEPARTMENT_OTHER)
Admission: RE | Admit: 2011-03-13 | Discharge: 2011-03-13 | Disposition: A | Discharge status: Home / Self Care | Payer: Medicare Other | Source: Ambulatory Visit | Attending: Cardiology | Admitting: Cardiology

## 2011-03-13 DIAGNOSIS — E785 Hyperlipidemia, unspecified: Secondary | ICD-10-CM | POA: Insufficient documentation

## 2011-03-13 DIAGNOSIS — I739 Peripheral vascular disease, unspecified: Secondary | ICD-10-CM | POA: Insufficient documentation

## 2011-03-13 DIAGNOSIS — I10 Essential (primary) hypertension: Secondary | ICD-10-CM | POA: Insufficient documentation

## 2011-03-13 DIAGNOSIS — R9431 Abnormal electrocardiogram [ECG] [EKG]: Secondary | ICD-10-CM | POA: Insufficient documentation

## 2011-03-13 DIAGNOSIS — I2510 Atherosclerotic heart disease of native coronary artery without angina pectoris: Secondary | ICD-10-CM | POA: Insufficient documentation

## 2011-03-18 NOTE — Cardiovascular Report (Signed)
  NAME:  [name] [name], [name] [name] NO.:  [id number]  MEDICAL RECORD NO.:  [id number]  LOCATION:                                 FACILITY:  PHYSICIAN:  Georga Hacking, M.D.DATE OF BIRTH:  Oct 31, 1933  DATE OF PROCEDURE:  03/13/2011                           CARDIAC CATHETERIZATION   HISTORY:  This is a 77 year old female with hypertension, peripheral vascular disease, and hyperlipidemia.  She needs shoulder surgery and presented with an abnormal EKG.  Cardiolite stress testing showed the presence of anterior ischemia.  PROCEDURES:  Left heart catheterization with coronary angiograms and left ventriculogram.  COMMENTS ABOUT PROCEDURE:  The patient was brought to the Outpatient Catheterization Laboratory.  She was prepped and draped in the usual manner and after Xylocaine anesthesia a 4-French sheath was placed in the right femoral percutaneously.  Angiogram was made using 4-French catheters and a 30 mL-ventriculogram was performed.  Labetalol 20 mg was administered for control of hypertension at the end of the procedure. She tolerated the procedure well.  HEMODYNAMIC DATA:  Aorta postcontrast 188/53.  LV postcontrast 188/18- 28.  ANGIOGRAPHIC DATA:  Left ventriculogram:  Performed in the 30-degree RAO projection.  The aortic valve is normal.  The mitral valve is normal. Left ventricle is normal in size.  There appears to be mild anteroapical hypokinesis, but ejection fraction is estimated at 50-55%.  Coronary arteries arise and distribute normally.  There is calcification noted in the aortic root and also calcification in the coronary arteries.  The left main coronary artery is short, but normal.  The left anterior descending is calcified proximally.  There is a severe 80-90% stenosis prior to the diagonal and distal LAD.  Distal LAD is diffusely diseased. Circumflex coronary artery has a 60% proximal stenosis.  There is a 70- 80% stenosis in the midportion of the  vessel prior to marginal branches which contain mild diffuse disease.  The right coronary artery is calcified with mild diffuse proximal disease.  There is a severe 90% stenosis prior to tortuous posterior descending and posterolateral branch.  IMPRESSION: 1. Three-vessel coronary artery disease. 2. Preserved left ventricular function with minimal anterior wall     hypokinesis.  RECOMMENDATIONS:  Consideration of medical therapy versus bypass grafting.  We will have surgeons look at the patient in terms of bypass grafting first.     W. [name], M.D.     WST/MEDQ  D:  03/13/2011  T:  03/13/2011  Job:  [id number]  cc:   Theressa Millard, M.D.  Electronically Signed by Lacretia Nicks. Donnie Aho M.D. on 03/18/2011 05:13:41 PM

## 2011-03-26 ENCOUNTER — Encounter: Payer: Medicare Other | Admitting: Thoracic Surgery (Cardiothoracic Vascular Surgery)

## 2011-04-02 ENCOUNTER — Encounter (INDEPENDENT_AMBULATORY_CARE_PROVIDER_SITE_OTHER): Payer: Medicare Other | Admitting: Thoracic Surgery (Cardiothoracic Vascular Surgery)

## 2011-04-02 DIAGNOSIS — I2510 Atherosclerotic heart disease of native coronary artery without angina pectoris: Secondary | ICD-10-CM

## 2011-04-03 NOTE — Consult Note (Signed)
NEW PATIENT CONSULTATION  [name] [name], [name] [name] DOB:  May 11, 1934                                        April 02, 2011 CHART #:  [id number]  [name] [name] is a 77 year old woman with a history of hypertension, diabetes, and carotid artery disease.  She recently has been having problems with her right shoulder and was scheduled to have right shoulder surgery.  During her preoperative evaluation, a stress Cardiolite was done.  The Lexiscan Cardiolite was abnormal with anteroseptal infarction and periinfarction ischemia noted.  Her ejection fraction was 43%.  She was seen by Dr. Donnie Aho and he recommended cardiac catheterization that was performed on March 13, 2011, and it showed severe three vessel disease.  Her ejection fraction appeared to be 50-55% with mild apical hypokinesis.  [name] [name] states that really since about a year ago when she had her hip surgery she felt tired and rundown.  Her mobility is relatively limited because of her hip and she uses a cane to walk and that aggravated her right shoulder which led to her recent need for right shoulder surgery. She has some occasional chest pains.  It is very nondescript.  She is not really able to characterize it as being exertional or random.  She is very vague in her description of it, but she says it only lasts for a short period of time and resolves spontaneously.  She has not had to do anything active to get the pain resolved.  She did apparently have some chest tightness during her sestamibi scan, which was accompanied by some mild dyspnea.  PAST MEDICAL HISTORY:  Significant for: 1. Hypertension. 2. Type 2 insulin-dependent diabetes for 20 years. 3. Obesity. 4. Hypothyroidism. 5. Gastroesophageal reflux disease. 6. Bilateral carotid artery stenosis, 40-59% on the right and 60-79%     on the left. 7. Hypothyroidism. 8. Degenerative joint disease. 9. Previous left hip surgery and right knee  replacement. 10.Gastroesophageal reflux and hiatal hernia. 11.Diverticulitis. 12.Possible early dementia with mild memory issues. 13.Apparently, she also has a small brain tumor, followed by Dr.     Vickey Huger, which does not require any intervention.  CURRENT MEDICATIONS: 1. Aspirin 81 mg daily. 2. Hydrochlorothiazide 25 mg daily. 3. Felodipine 5 mg daily. 4. Metformin 500 mg b.i.d. 5. Glipizide 10 mg b.i.d. 6. Actos 30 mg daily. 7. Lantus 10 units subcu nightly. 8. Levothyroxine 150 mcg daily. 9. Fluoxetine 20 mg daily. 10.Ambien 5 mg as needed for sleep. 11.Omeprazole 20 mg daily. 12.Lumigan eye drops 0.03% 1 drop each nightly. 13.Klor-Con 20 mEq daily. 14.Seroquel 25 mg nightly. 15.She also takes vitamin D3, vitamin D, vitamin C. 16.Tramadol 50 mg q.6 h for pain.  She has an allergy to [facility] which causes hallucinations.  FAMILY HISTORY:  Negative.  SOCIAL HISTORY:  She has a 50-pack-year history, quit in 1980.  She lives alone.  She does not exercise regularly.  She is a retired Runner, broadcasting/film/video.  REVIEW OF SYSTEMS:  See HPI.  Primarily, she has been tired and rundown. She has had right shoulder pain, some mild memory loss, although she can carry on conversation without problem.  She has some mobility issues. She does have urinary incontinence and has had acupuncture of her bladder problems through [facility].  No current GI symptoms, although she has had indigestion for many years.  No stroke or TIA symptoms.  All other systems are negative.  PHYSICAL EXAMINATION:  GENERAL:  [name] [name] is an obese 77 year old woman in no acute distress. VITAL SIGNS:  Blood pressure 148/70, pulse 90, respirations 18, and oxygen saturation 90% on room air. GENERAL:  She is overweight, but well developed, well nourished. NEUROLOGICAL:  She is alert and oriented x3, grossly intact and has good memory for recent events leading up to the visit. HEENT:  Unremarkable. NECK:  Bilateral  carotid bruits. CARDIAC:  Regular rate and rhythm.  Normal S1 and S2.  No rubs, murmurs, or gallops. LUNGS:  Clear with equal breath sounds bilaterally. ABDOMEN:  Soft and nontender. EXTREMITIES:  Without clubbing, cyanosis, or edema.  She has faintly palpable dorsalis pedis pulses bilaterally.  LABORATORY DATA:  Cardiac catheterization and stress test results reviewed.  Cardiac catheterization films reviewed as well.  IMPRESSION:  [name] [name] is a 77 year old woman with three-vessel coronary artery disease with only mildly impaired left ventricular function.  She is scheduled to have shoulder surgery and it is not felt that she is a candidate for that surgery in the setting of given the severity of her coronary artery disease and it was questioned whether she would be a candidate for coronary artery bypass grafting.  In my opinion, I do believe she is a candidate.  I had a long discussion with her and her daughter that with bypass surgery some people will experience memory loss or aggravation of preexisting memory issues and that certainly a concern in her case, but is not prohibitive risk and by large most people will remain very functional after bypass surgery.  She does have some ability limitations and that certainly is going to make recovery little more difficult, but again does not preclude her from having needed procedure.  I discussed in detail with them the indications, risks, benefits, and alternative treatments and she is not a good candidate for stenting given the nature of her lesions with three-vessel disease and diabetes.  Her chances of good outcome are far better with bypass surgery in the long term.  She and her daughter understand the risks include but not limited to death, stroke, MI, DVT, PE, bleeding, possible need for transfusions, infections as well as other organ system dysfunction including respiratory, renal, hepatic, or GI complications. She understands and  accepts these risks and agrees to proceed with surgery.  We will have it scheduled for Thursday, April 17, 2011, pending preoperative carotid Dopplers.  Salvatore Decent Dorris Fetch, M.D. Electronically Signed  SCH/MEDQ  D:  04/02/2011  T:  04/03/2011  Job:  [id number]  cc:   Georga Hacking, M.D. Theressa Millard, M.D.

## 2011-04-15 ENCOUNTER — Other Ambulatory Visit: Payer: Self-pay | Admitting: Thoracic Surgery (Cardiothoracic Vascular Surgery)

## 2011-04-15 ENCOUNTER — Encounter (HOSPITAL_COMMUNITY)
Admission: RE | Admit: 2011-04-15 | Discharge: 2011-04-15 | Disposition: A | Discharge status: Home / Self Care | Payer: Medicare Other | Source: Ambulatory Visit | Attending: Thoracic Surgery (Cardiothoracic Vascular Surgery) | Admitting: Thoracic Surgery (Cardiothoracic Vascular Surgery)

## 2011-04-15 ENCOUNTER — Ambulatory Visit (HOSPITAL_COMMUNITY)
Admission: RE | Admit: 2011-04-15 | Discharge: 2011-04-15 | Disposition: A | Discharge status: Home / Self Care | Payer: Medicare Other | Source: Ambulatory Visit | Attending: Thoracic Surgery (Cardiothoracic Vascular Surgery) | Admitting: Thoracic Surgery (Cardiothoracic Vascular Surgery)

## 2011-04-15 DIAGNOSIS — Z01810 Encounter for preprocedural cardiovascular examination: Secondary | ICD-10-CM

## 2011-04-15 DIAGNOSIS — I6529 Occlusion and stenosis of unspecified carotid artery: Secondary | ICD-10-CM | POA: Insufficient documentation

## 2011-04-15 DIAGNOSIS — E119 Type 2 diabetes mellitus without complications: Secondary | ICD-10-CM | POA: Insufficient documentation

## 2011-04-15 DIAGNOSIS — I2510 Atherosclerotic heart disease of native coronary artery without angina pectoris: Secondary | ICD-10-CM | POA: Insufficient documentation

## 2011-04-15 DIAGNOSIS — Z01812 Encounter for preprocedural laboratory examination: Secondary | ICD-10-CM | POA: Insufficient documentation

## 2011-04-15 LAB — URINALYSIS, ROUTINE W REFLEX MICROSCOPIC
Glucose, UA: NEGATIVE mg/dL
Hgb urine dipstick: NEGATIVE
Protein, ur: NEGATIVE mg/dL
Specific Gravity, Urine: 1.023 (ref 1.005–1.030)
pH: 5 (ref 5.0–8.0)

## 2011-04-15 LAB — PROTIME-INR
INR: 0.97 (ref 0.00–1.49)
Prothrombin Time: 13.1 seconds (ref 11.6–15.2)

## 2011-04-15 LAB — URINE MICROSCOPIC-ADD ON

## 2011-04-15 LAB — BLOOD GAS, ARTERIAL
Bicarbonate: 24.1 mEq/L — ABNORMAL HIGH (ref 20.0–24.0)
FIO2: 0.21 %
Patient temperature: 98.6
TCO2: 25.3 mmol/L (ref 0–100)
pH, Arterial: 7.417 — ABNORMAL HIGH (ref 7.350–7.400)
pO2, Arterial: 100 mmHg (ref 80.0–100.0)

## 2011-04-15 LAB — COMPREHENSIVE METABOLIC PANEL
Alkaline Phosphatase: 107 U/L (ref 39–117)
BUN: 22 mg/dL (ref 6–23)
Creatinine, Ser: 1.28 mg/dL — ABNORMAL HIGH (ref 0.50–1.10)
GFR calc Af Amer: 49 mL/min — ABNORMAL LOW (ref >60)
Glucose, Bld: 146 mg/dL — ABNORMAL HIGH (ref 70–99)
Potassium: 4.5 mEq/L (ref 3.5–5.1)
Total Protein: 6.9 g/dL (ref 6.0–8.3)

## 2011-04-15 LAB — CBC
HCT: 35.7 % — ABNORMAL LOW (ref 36.0–46.0)
Hemoglobin: 12.1 g/dL (ref 12.0–15.0)
RBC: 3.82 MIL/uL — ABNORMAL LOW (ref 3.87–5.11)
WBC: 7.1 10*3/uL (ref 4.0–10.5)

## 2011-04-17 ENCOUNTER — Inpatient Hospital Stay (HOSPITAL_COMMUNITY)
Admission: RE | Admit: 2011-04-17 | Discharge: 2011-04-25 | DRG: 236 | Disposition: A | Discharge status: Skilled Nursing Facility | Payer: Medicare Other | Source: Ambulatory Visit | Attending: Thoracic Surgery (Cardiothoracic Vascular Surgery) | Admitting: Thoracic Surgery (Cardiothoracic Vascular Surgery)

## 2011-04-17 ENCOUNTER — Inpatient Hospital Stay (HOSPITAL_COMMUNITY): Payer: Medicare Other

## 2011-04-17 DIAGNOSIS — D496 Neoplasm of unspecified behavior of brain: Secondary | ICD-10-CM | POA: Diagnosis present

## 2011-04-17 DIAGNOSIS — E876 Hypokalemia: Secondary | ICD-10-CM | POA: Diagnosis not present

## 2011-04-17 DIAGNOSIS — K449 Diaphragmatic hernia without obstruction or gangrene: Secondary | ICD-10-CM | POA: Diagnosis present

## 2011-04-17 DIAGNOSIS — E1169 Type 2 diabetes mellitus with other specified complication: Secondary | ICD-10-CM | POA: Diagnosis not present

## 2011-04-17 DIAGNOSIS — I2510 Atherosclerotic heart disease of native coronary artery without angina pectoris: Principal | ICD-10-CM | POA: Diagnosis present

## 2011-04-17 DIAGNOSIS — I498 Other specified cardiac arrhythmias: Secondary | ICD-10-CM | POA: Diagnosis not present

## 2011-04-17 DIAGNOSIS — Z79899 Other long term (current) drug therapy: Secondary | ICD-10-CM

## 2011-04-17 DIAGNOSIS — F99 Mental disorder, not otherwise specified: Secondary | ICD-10-CM | POA: Diagnosis not present

## 2011-04-17 DIAGNOSIS — Z794 Long term (current) use of insulin: Secondary | ICD-10-CM

## 2011-04-17 DIAGNOSIS — E8779 Other fluid overload: Secondary | ICD-10-CM | POA: Diagnosis not present

## 2011-04-17 DIAGNOSIS — I10 Essential (primary) hypertension: Secondary | ICD-10-CM | POA: Diagnosis present

## 2011-04-17 DIAGNOSIS — E039 Hypothyroidism, unspecified: Secondary | ICD-10-CM | POA: Diagnosis present

## 2011-04-17 DIAGNOSIS — I6529 Occlusion and stenosis of unspecified carotid artery: Secondary | ICD-10-CM | POA: Diagnosis present

## 2011-04-17 DIAGNOSIS — K219 Gastro-esophageal reflux disease without esophagitis: Secondary | ICD-10-CM | POA: Diagnosis present

## 2011-04-17 DIAGNOSIS — D62 Acute posthemorrhagic anemia: Secondary | ICD-10-CM | POA: Diagnosis not present

## 2011-04-17 DIAGNOSIS — F0390 Unspecified dementia without behavioral disturbance: Secondary | ICD-10-CM | POA: Diagnosis present

## 2011-04-17 DIAGNOSIS — I4891 Unspecified atrial fibrillation: Secondary | ICD-10-CM | POA: Diagnosis not present

## 2011-04-17 DIAGNOSIS — M1990 Unspecified osteoarthritis, unspecified site: Secondary | ICD-10-CM | POA: Diagnosis present

## 2011-04-17 DIAGNOSIS — E669 Obesity, unspecified: Secondary | ICD-10-CM | POA: Diagnosis present

## 2011-04-17 DIAGNOSIS — I658 Occlusion and stenosis of other precerebral arteries: Secondary | ICD-10-CM | POA: Diagnosis present

## 2011-04-17 HISTORY — PX: CORONARY ARTERY BYPASS GRAFT: SHX141

## 2011-04-17 LAB — POCT I-STAT 3, ART BLOOD GAS (G3+)
Acid-base deficit: 3 mmol/L — ABNORMAL HIGH (ref 0.0–2.0)
Bicarbonate: 23.5 mEq/L (ref 20.0–24.0)
O2 Saturation: 100 %
O2 Saturation: 98 %
Patient temperature: 35.9
TCO2: 26 mmol/L (ref 0–100)
TCO2: 25 mmol/L (ref 0–100)
pCO2 arterial: 40.5 mmHg (ref 35.0–45.0)
pCO2 arterial: 46.1 mmHg — ABNORMAL HIGH (ref 35.0–45.0)
pCO2 arterial: 50.5 mmHg — ABNORMAL HIGH (ref 35.0–45.0)
pCO2 arterial: 47.3 mmHg — ABNORMAL HIGH (ref 35.0–45.0)
pH, Arterial: 7.313 — ABNORMAL LOW (ref 7.350–7.400)
pH, Arterial: 7.288 — ABNORMAL LOW (ref 7.350–7.400)
pO2, Arterial: 118 mmHg — ABNORMAL HIGH (ref 80.0–100.0)
pO2, Arterial: 130 mmHg — ABNORMAL HIGH (ref 80.0–100.0)

## 2011-04-17 LAB — POCT I-STAT 4, (NA,K, GLUC, HGB,HCT)
Glucose, Bld: 103 mg/dL — ABNORMAL HIGH (ref 70–99)
Glucose, Bld: 179 mg/dL — ABNORMAL HIGH (ref 70–99)
HCT: 26 % — ABNORMAL LOW (ref 36.0–46.0)
HCT: 23 % — ABNORMAL LOW (ref 36.0–46.0)
Hemoglobin: 8.8 g/dL — ABNORMAL LOW (ref 12.0–15.0)
Hemoglobin: 10.2 g/dL — ABNORMAL LOW (ref 12.0–15.0)
Potassium: 3.3 mEq/L — ABNORMAL LOW (ref 3.5–5.1)
Potassium: 4.4 mEq/L (ref 3.5–5.1)
Sodium: 140 mEq/L (ref 135–145)
Sodium: 136 mEq/L (ref 135–145)
Sodium: 136 mEq/L (ref 135–145)

## 2011-04-17 LAB — CBC
HCT: 30.8 % — ABNORMAL LOW (ref 36.0–46.0)
Hemoglobin: 10.4 g/dL — ABNORMAL LOW (ref 12.0–15.0)
Hemoglobin: 11.1 g/dL — ABNORMAL LOW (ref 12.0–15.0)
Platelets: 148 10*3/uL — ABNORMAL LOW (ref 150–400)
RBC: 3.33 MIL/uL — ABNORMAL LOW (ref 3.87–5.11)
RBC: 3.54 MIL/uL — ABNORMAL LOW (ref 3.87–5.11)
WBC: 13.1 10*3/uL — ABNORMAL HIGH (ref 4.0–10.5)

## 2011-04-17 LAB — POCT I-STAT, CHEM 8
BUN: 21 mg/dL (ref 6–23)
Calcium, Ion: 1.22 mmol/L (ref 1.12–1.32)
Creatinine, Ser: 1.2 mg/dL — ABNORMAL HIGH (ref 0.50–1.10)
Hemoglobin: 10.9 g/dL — ABNORMAL LOW (ref 12.0–15.0)
Sodium: 139 mEq/L (ref 135–145)
TCO2: 22 mmol/L (ref 0–100)

## 2011-04-17 LAB — PROTIME-INR
INR: 1.42 (ref 0.00–1.49)
Prothrombin Time: 17.6 seconds — ABNORMAL HIGH (ref 11.6–15.2)

## 2011-04-17 LAB — PLATELET COUNT: Platelets: 147 10*3/uL — ABNORMAL LOW (ref 150–400)

## 2011-04-17 LAB — CREATININE, SERUM
Creatinine, Ser: 1.01 mg/dL (ref 0.50–1.10)
GFR calc non Af Amer: 53 mL/min — ABNORMAL LOW (ref >60)

## 2011-04-17 LAB — GLUCOSE, CAPILLARY: Glucose-Capillary: 98 mg/dL (ref 70–99)

## 2011-04-17 LAB — APTT: aPTT: 38 seconds — ABNORMAL HIGH (ref 24–37)

## 2011-04-17 LAB — MAGNESIUM: Magnesium: 3.2 mg/dL — ABNORMAL HIGH (ref 1.5–2.5)

## 2011-04-17 LAB — HEMOGLOBIN AND HEMATOCRIT, BLOOD
HCT: 22.7 % — ABNORMAL LOW (ref 36.0–46.0)
Hemoglobin: 7.8 g/dL — ABNORMAL LOW (ref 12.0–15.0)

## 2011-04-17 LAB — POCT I-STAT GLUCOSE: Glucose, Bld: 144 mg/dL — ABNORMAL HIGH (ref 70–99)

## 2011-04-18 ENCOUNTER — Inpatient Hospital Stay (HOSPITAL_COMMUNITY): Payer: Medicare Other

## 2011-04-18 LAB — GLUCOSE, CAPILLARY
Glucose-Capillary: 101 mg/dL — ABNORMAL HIGH (ref 70–99)
Glucose-Capillary: 105 mg/dL — ABNORMAL HIGH (ref 70–99)
Glucose-Capillary: 106 mg/dL — ABNORMAL HIGH (ref 70–99)
Glucose-Capillary: 119 mg/dL — ABNORMAL HIGH (ref 70–99)
Glucose-Capillary: 120 mg/dL — ABNORMAL HIGH (ref 70–99)
Glucose-Capillary: 121 mg/dL — ABNORMAL HIGH (ref 70–99)
Glucose-Capillary: 131 mg/dL — ABNORMAL HIGH (ref 70–99)
Glucose-Capillary: 134 mg/dL — ABNORMAL HIGH (ref 70–99)
Glucose-Capillary: 138 mg/dL — ABNORMAL HIGH (ref 70–99)
Glucose-Capillary: 122 mg/dL — ABNORMAL HIGH (ref 70–99)
Glucose-Capillary: 124 mg/dL — ABNORMAL HIGH (ref 70–99)
Glucose-Capillary: 139 mg/dL — ABNORMAL HIGH (ref 70–99)
Glucose-Capillary: 198 mg/dL — ABNORMAL HIGH (ref 70–99)

## 2011-04-18 LAB — TYPE AND SCREEN
ABO/RH(D): O POS
Antibody Screen: NEGATIVE
Unit division: 0

## 2011-04-18 LAB — MAGNESIUM: Magnesium: 2.5 mg/dL (ref 1.5–2.5)

## 2011-04-18 LAB — CBC
MCH: 31.2 pg (ref 26.0–34.0)
MCH: 31.6 pg (ref 26.0–34.0)
MCHC: 33.4 g/dL (ref 30.0–36.0)
MCHC: 33.7 g/dL (ref 30.0–36.0)
MCV: 93.4 fL (ref 78.0–100.0)
Platelets: 167 10*3/uL (ref 150–400)
Platelets: 141 10*3/uL — ABNORMAL LOW (ref 150–400)
RBC: 3.49 MIL/uL — ABNORMAL LOW (ref 3.87–5.11)
RDW: 14.1 % (ref 11.5–15.5)
RDW: 14.3 % (ref 11.5–15.5)

## 2011-04-18 LAB — POCT I-STAT, CHEM 8
Calcium, Ion: 1.23 mmol/L (ref 1.12–1.32)
Creatinine, Ser: 1.3 mg/dL — ABNORMAL HIGH (ref 0.50–1.10)
Glucose, Bld: 189 mg/dL — ABNORMAL HIGH (ref 70–99)
HCT: 29 % — ABNORMAL LOW (ref 36.0–46.0)
Hemoglobin: 9.9 g/dL — ABNORMAL LOW (ref 12.0–15.0)
Potassium: 3.8 mEq/L (ref 3.5–5.1)
TCO2: 26 mmol/L (ref 0–100)

## 2011-04-18 LAB — BASIC METABOLIC PANEL
CO2: 25 mEq/L (ref 19–32)
Calcium: 8.6 mg/dL (ref 8.4–10.5)
Creatinine, Ser: 1 mg/dL (ref 0.50–1.10)
GFR calc Af Amer: >60 mL/min (ref >60)
GFR calc non Af Amer: 54 mL/min — ABNORMAL LOW (ref >60)
Sodium: 142 mEq/L (ref 135–145)

## 2011-04-18 LAB — CREATININE, SERUM
Creatinine, Ser: 1.12 mg/dL — ABNORMAL HIGH (ref 0.50–1.10)
GFR calc non Af Amer: 47 mL/min — ABNORMAL LOW (ref >60)

## 2011-04-18 LAB — MRSA PCR SCREENING: MRSA by PCR: NEGATIVE

## 2011-04-18 NOTE — Op Note (Signed)
[name], [name] NO.:  [id number]  MEDICAL RECORD NO.:  [id number]  LOCATION:  2303                         FACILITY:  [facility]  PHYSICIAN:  Salvatore Decent. Dorris Fetch, M.D.DATE OF BIRTH:  [birth year]  DATE OF PROCEDURE:  04/17/2011 DATE OF DISCHARGE:                              OPERATIVE REPORT   PREOPERATIVE DIAGNOSIS:  Three-vessel coronary artery disease.  POSTOPERATIVE DIAGNOSIS:  Three-vessel coronary artery disease.  PROCEDURES:  Median sternotomy, extracorporeal circulation, coronary artery bypass grafting x4 (left internal mammary artery to left anterior descending, saphenous vein graft first diagonal, saphenous vein graft to obtuse marginal 1, saphenous vein graft to posterior descending).  SURGEON:  Salvatore Decent. Dorris Fetch, MD  ASSISTANT:  Rowe Clack, PA-C  ANESTHESIA:  General.  FINDINGS:  Majority of vein from left thigh unusable, vein from right thigh good quality for the patient's age, internal mammary artery good quality, posterior descending fair quality target, and diagonal, OM, and LAD good-quality targets.  CLINICAL NOTE:  [name] [name] is a 77 year old woman with history of hypertension, diabetes, and carotid disease.  She was scheduled to have right shoulder surgery during her preoperative evaluation and abnormal EKG was noted.  Lexiscan Cardiolite was performed and it showed an anteroseptal infarction with periinfarction ischemia and ejection fraction of 33%.  Dr. [name] did cardiac catheterization which revealed three-vessel coronary artery disease.  She is referred for consideration for coronary artery bypass grafting.  The patient does have a history of possible early dementia.  She was felt to be a candidate for surgery.  The indications, risks, benefits, and alternatives were discussed in detail with the patient and her family. She understood and accepted the risks and agreed to proceed.  OPERATIVE NOTE:  [name] [name] was  brought to the preop holding area on April 17, 2011.  There, the Anesthesia Service placed Swan-Ganz catheter and arterial blood pressure monitoring line.  Intravenous antibiotics were administered.  The patient was taken to the operating room, anesthetized and intubated.  Foley catheter was placed.  The chest, abdomen, and legs were prepped and draped in the usual sterile fashion. An incision was made in the medial aspect of the left leg at the level of the knee.  The greater saphenous vein was identified and was harvested endoscopically.  Simultaneously, a median sternotomy was performed and the left internal mammary artery was harvested using standard technique.  The internal mammary artery was a good-quality vessel.  2000 units of heparin was administered during the vessel harvest.  When the vein was removed from the left leg, it was noted that the majority of this was too small to utilize.  Only, the very proximal portion of the vein was acceptable.  Therefore, an incision was made in the medial aspect of the right leg and greater saphenous vein was harvested from that endoscopically and turned out to be a good-quality conduit for the patient's age.  After harvesting the conduits, the pericardium was opened.  The remainder of the full heparin dose was given after confirming adequate anticoagulation with ACT measurement.  The aorta was cannulated via concentric 2-0 Ethibond pledgeted purse-string sutures.  A dual-stage venous cannula was placed via purse-string  suture in the rectal appendage.  Cardiopulmonary bypass was instituted and the patient was cooled to 32 degrees Celsius.  The coronary arteries were inspected and anastomotic sites were chosen.  Of note, with lifting the heart, the epicardial fat pad was very fragile and there was a tear in the posterolateral wall near the apex.  The anastomotic sites were chosen. The conduits were inspected and cut to length.  A foam pad was  placed in the pericardium to insulate the heart and protect the left phrenic nerve.  A temperature probe was placed in the myocardial septum and a cardioplegic cannula was placed in the ascending aorta.  The aorta was crossclamped.  The left ventricle was emptied via the aortic root vent.  Cardiac arrest was then achieved with a combination of cold antegrade blood cardioplegia and topical iced saline.  After achieving complete diastolic arrest and adequate myocardial septal cooling to less than 10 degrees Celsius with 1 liter of cardioplegia, the following distal anastomoses were performed.  First, the reversed saphenous vein graft was placed end to side to the posterior descending branch of the right coronary artery.  This was a less than 1.5 mm diameter vessel, but 1-mm probe did pass distally.  It was fair quality.  The vein was anastomosed end to side with a running 7- 0 Prolene suture.  At the completion of each anastomosis, it was probed proximally and distally to ensure patency prior to tying the suture.  At the completion of each vein graft, cardioplegia was administered down the graft to assess flow and hemostasis.  Both were satisfactory for the posterior descending graft.  Next, the reversed saphenous vein graft was placed end to side to obtuse marginal 1.  This was a 1.5-mm intramyocardial target.  The vein was anastomosed end to side with a running 7-0 Prolene suture.  There was good flow through the graft and good hemostasis.  Next, the reversed saphenous vein graft was placed end to side to the first diagonal branch of the LAD.  This with a high anterolateral branch, essentially a ramus inner medius equivalent.  It was a 1.5-mm vessel.  It was heavily calcified proximally and was a good target distally.  The vein was anastomosed end to side with running 7-0 Prolene suture.  Again, there was good flow through the graft and good hemostasis.  Additional cardioplegia  then was administered down the aortic root.  The left internal mammary artery was brought through a window in the pericardium.  The distal end was beveled.  It was a 2-mm good-quality conduit.  It was anastomosed end to side to the LAD, which was a 1.5-mm good-quality target at the site of the anastomosis.  At the completion of the mammary to LAD anastomosis, the bulldog clamps were briefly removed to inspect for hemostasis.  Immediate rapid septal rewarming was noted.  The bulldog clamps were placed and the mammary pedicle was tacked to the epicardial surface of the heart with 6-0 Prolene sutures.  Additional cardioplegia was administered.  The vein grafts were cut to length.  The proximal anastomoses for the obtuse marginal and posterior descending grafts were placed with 4.5-mm punch aortotomies with running 6-0 Prolene sutures.  The segment of vein use for the diagonal was not of sufficient length to reach the aorta.  It was later placed end to side to the obtuse marginal vein graft.  After completing the second proximal anastomosis, the patient was placed in Trendelenburg position. Lidocaine was administered.  The  aortic root was deaired.  The bulldog clamps were removed from the left mammary artery and the aortic crossclamp was removed.  The total crossclamp time was 79 minutes.  The patient initially fibrillated and required single defibrillation with 10 joules and was in sinus rhythm thereafter.  Bulldog clamps were placed proximally and distally on the obtuse marginal graft and longitudinal venotomy was made and an end-to-side anastomosis for the proximal for the diagonal graft was performed with a running 7-0 Prolene suture.  The anastomosis and veins were deaired prior to tying the suture.  While rewarming was completed, all proximal and distal anastomoses were inspected for hemostasis.  Epicardial pacing wires were placed on the right ventricle and right atrium.  When the  patient had rewarmed to a core temperature of 37 degrees Celsius, she was weaned from cardiopulmonary bypass on the first attempt.  Total bypass time was 133 minutes.  The initial cardiac index was greater than 2 liters per minute per meter squared and the patient remained hemodynamically stable throughout postbypass period.  A test dose of protamine was administered and was well tolerated.  The atrial and aortic cannulae were removed.  The remainder of the protamine was administered without incident.  Chest was irrigated with warm saline.  Hemostasis was achieved.  The pericardium was not closed.  The left pleural and mediastinal chest tubes were placed through separate subcostal incisions.  The sternum was closed with interrupted heavy gauge stainless steel wires.  The pectoralis fascia, subcutaneous tissue, and skin were closed in standard fashion.  All sponge, needle, and instrument sponge counts were correct at the end of the procedure. The patient was taken from the operating room to the Surgical Intensive Care Unit in good condition.     Salvatore Decent Dorris Fetch, M.D.     SCH/MEDQ  D:  04/17/2011  T:  04/18/2011  Job:  [id number]  cc:   Georga Hacking, M.D. Theressa Millard, M.D.  Electronically Signed by Charlett Lango M.D. on 04/18/2011 03:31:34 PM

## 2011-04-19 ENCOUNTER — Inpatient Hospital Stay (HOSPITAL_COMMUNITY): Payer: Medicare Other

## 2011-04-19 DIAGNOSIS — E1165 Type 2 diabetes mellitus with hyperglycemia: Secondary | ICD-10-CM

## 2011-04-19 DIAGNOSIS — IMO0001 Reserved for inherently not codable concepts without codable children: Secondary | ICD-10-CM

## 2011-04-19 LAB — CBC
HCT: 29.9 % — ABNORMAL LOW (ref 36.0–46.0)
Hemoglobin: 9.8 g/dL — ABNORMAL LOW (ref 12.0–15.0)
MCH: 31.1 pg (ref 26.0–34.0)
MCHC: 32.8 g/dL (ref 30.0–36.0)
MCV: 94.9 fL (ref 78.0–100.0)
RDW: 14.4 % (ref 11.5–15.5)

## 2011-04-19 LAB — BASIC METABOLIC PANEL
CO2: 26 mEq/L (ref 19–32)
Calcium: 8.8 mg/dL (ref 8.4–10.5)
GFR calc Af Amer: >60 mL/min (ref >60)
Potassium: 3.6 mEq/L (ref 3.5–5.1)

## 2011-04-19 LAB — GLUCOSE, CAPILLARY: Glucose-Capillary: 113 mg/dL — ABNORMAL HIGH (ref 70–99)

## 2011-04-20 LAB — BASIC METABOLIC PANEL
BUN: 27 mg/dL — ABNORMAL HIGH (ref 6–23)
CO2: 29 mEq/L (ref 19–32)
Calcium: 9.1 mg/dL (ref 8.4–10.5)
Chloride: 106 mEq/L (ref 96–112)
Creatinine, Ser: 0.98 mg/dL (ref 0.50–1.10)
GFR calc Af Amer: >60 mL/min (ref >60)
GFR calc non Af Amer: 55 mL/min — ABNORMAL LOW (ref >60)
Glucose, Bld: 97 mg/dL (ref 70–99)
Potassium: 4.2 mEq/L (ref 3.5–5.1)
Sodium: 139 mEq/L (ref 135–145)

## 2011-04-20 LAB — CBC
HCT: 27.6 % — ABNORMAL LOW (ref 36.0–46.0)
Hemoglobin: 9.1 g/dL — ABNORMAL LOW (ref 12.0–15.0)
MCH: 31.6 pg (ref 26.0–34.0)
MCHC: 33 g/dL (ref 30.0–36.0)
MCV: 95.8 fL (ref 78.0–100.0)
Platelets: 131 10*3/uL — ABNORMAL LOW (ref 150–400)
RBC: 2.88 MIL/uL — ABNORMAL LOW (ref 3.87–5.11)
RDW: 14.1 % (ref 11.5–15.5)
WBC: 10.2 10*3/uL (ref 4.0–10.5)

## 2011-04-20 LAB — GLUCOSE, CAPILLARY
Glucose-Capillary: 122 mg/dL — ABNORMAL HIGH (ref 70–99)
Glucose-Capillary: 84 mg/dL (ref 70–99)
Glucose-Capillary: 277 mg/dL — ABNORMAL HIGH (ref 70–99)
Glucose-Capillary: 134 mg/dL — ABNORMAL HIGH (ref 70–99)

## 2011-04-21 ENCOUNTER — Inpatient Hospital Stay (HOSPITAL_COMMUNITY): Payer: Medicare Other

## 2011-04-21 LAB — BASIC METABOLIC PANEL
BUN: 31 mg/dL — ABNORMAL HIGH (ref 6–23)
CO2: 28 mEq/L (ref 19–32)
Calcium: 9 mg/dL (ref 8.4–10.5)
Chloride: 106 mEq/L (ref 96–112)
Creatinine, Ser: 0.97 mg/dL (ref 0.50–1.10)
GFR calc Af Amer: >60 mL/min (ref >60)
GFR calc non Af Amer: 56 mL/min — ABNORMAL LOW (ref >60)
Glucose, Bld: 128 mg/dL — ABNORMAL HIGH (ref 70–99)
Potassium: 4.7 mEq/L (ref 3.5–5.1)
Sodium: 138 mEq/L (ref 135–145)

## 2011-04-21 LAB — CBC
HCT: 26.6 % — ABNORMAL LOW (ref 36.0–46.0)
Hemoglobin: 8.8 g/dL — ABNORMAL LOW (ref 12.0–15.0)
MCH: 31.7 pg (ref 26.0–34.0)
MCHC: 33.1 g/dL (ref 30.0–36.0)
MCV: 95.7 fL (ref 78.0–100.0)
Platelets: 158 10*3/uL (ref 150–400)
RBC: 2.78 MIL/uL — ABNORMAL LOW (ref 3.87–5.11)
RDW: 13.9 % (ref 11.5–15.5)
WBC: 9.2 10*3/uL (ref 4.0–10.5)

## 2011-04-21 LAB — GLUCOSE, CAPILLARY
Glucose-Capillary: 113 mg/dL — ABNORMAL HIGH (ref 70–99)
Glucose-Capillary: 119 mg/dL — ABNORMAL HIGH (ref 70–99)
Glucose-Capillary: 59 mg/dL — ABNORMAL LOW (ref 70–99)
Glucose-Capillary: 269 mg/dL — ABNORMAL HIGH (ref 70–99)
Glucose-Capillary: 59 mg/dL — ABNORMAL LOW (ref 70–99)
Glucose-Capillary: 155 mg/dL — ABNORMAL HIGH (ref 70–99)
Glucose-Capillary: 169 mg/dL — ABNORMAL HIGH (ref 70–99)

## 2011-04-21 LAB — TSH: TSH: 0.072 u[IU]/mL — ABNORMAL LOW (ref 0.350–4.500)

## 2011-04-22 ENCOUNTER — Inpatient Hospital Stay (HOSPITAL_COMMUNITY): Payer: Medicare Other

## 2011-04-22 LAB — GLUCOSE, CAPILLARY
Glucose-Capillary: 150 mg/dL — ABNORMAL HIGH (ref 70–99)
Glucose-Capillary: 171 mg/dL — ABNORMAL HIGH (ref 70–99)

## 2011-04-22 LAB — CBC
HCT: 27.9 % — ABNORMAL LOW (ref 36.0–46.0)
Hemoglobin: 9 g/dL — ABNORMAL LOW (ref 12.0–15.0)
MCHC: 32.3 g/dL (ref 30.0–36.0)
MCV: 96.5 fL (ref 78.0–100.0)
RDW: 14.1 % (ref 11.5–15.5)

## 2011-04-22 LAB — BASIC METABOLIC PANEL
BUN: 28 mg/dL — ABNORMAL HIGH (ref 6–23)
Chloride: 105 mEq/L (ref 96–112)
Creatinine, Ser: 1.12 mg/dL — ABNORMAL HIGH (ref 0.50–1.10)
GFR calc Af Amer: 57 mL/min — ABNORMAL LOW (ref >60)
GFR calc non Af Amer: 47 mL/min — ABNORMAL LOW (ref >60)
Glucose, Bld: 135 mg/dL — ABNORMAL HIGH (ref 70–99)

## 2011-04-23 LAB — GLUCOSE, CAPILLARY: Glucose-Capillary: 124 mg/dL — ABNORMAL HIGH (ref 70–99)

## 2011-04-24 LAB — GLUCOSE, CAPILLARY
Glucose-Capillary: 201 mg/dL — ABNORMAL HIGH (ref 70–99)
Glucose-Capillary: 149 mg/dL — ABNORMAL HIGH (ref 70–99)

## 2011-04-25 LAB — COMPREHENSIVE METABOLIC PANEL
AST: 10 U/L (ref 0–37)
BUN: 14 mg/dL (ref 6–23)
CO2: 28 mEq/L (ref 19–32)
Calcium: 9.2 mg/dL (ref 8.4–10.5)
Chloride: 103 mEq/L (ref 96–112)
Creatinine, Ser: 1.01 mg/dL (ref 0.50–1.10)
GFR calc Af Amer: >60 mL/min (ref >60)
GFR calc non Af Amer: 53 mL/min — ABNORMAL LOW (ref >60)
Glucose, Bld: 159 mg/dL — ABNORMAL HIGH (ref 70–99)
Total Bilirubin: 0.2 mg/dL — ABNORMAL LOW (ref 0.3–1.2)

## 2011-04-25 LAB — GLUCOSE, CAPILLARY: Glucose-Capillary: 166 mg/dL — ABNORMAL HIGH (ref 70–99)

## 2011-04-25 LAB — CBC
HCT: 27.1 % — ABNORMAL LOW (ref 36.0–46.0)
Hemoglobin: 8.9 g/dL — ABNORMAL LOW (ref 12.0–15.0)
MCH: 31.3 pg (ref 26.0–34.0)
MCV: 95.4 fL (ref 78.0–100.0)
Platelets: 330 10*3/uL (ref 150–400)
RBC: 2.84 MIL/uL — ABNORMAL LOW (ref 3.87–5.11)
WBC: 8.1 10*3/uL (ref 4.0–10.5)

## 2011-05-05 NOTE — Discharge Summary (Signed)
[name] [name], [name] NO.:  [id number]  MEDICAL RECORD NO.:  [id number]  LOCATION:  2018                         FACILITY:  [facility]  PHYSICIAN:  Salvatore Decent. [name] [name], M.D.DATE OF BIRTH:  12-17-1933  DATE OF ADMISSION:  04/17/2011 DATE OF DISCHARGE:  04/23/2011                              DISCHARGE SUMMARY   ADMITTING DIAGNOSES: 1. Multivessel coronary artery disease (with an ejection fraction of     50-55%). 2. History of hypertension. 3. History of insulin-dependent diabetes mellitus (for approximately     20 years). 4. History of obesity. 5. History of hypothyroidism. 6. History of gastroesophageal reflux disease. 7. History of bilateral carotid artery stenoses (40-59% on the right,     60-79% on the left). 8. History of degenerative joint disease. 9. History of diverticulitis. 10.History of hiatal hernia. 11.Possible early dementia with mild memory loss. 12.Apparent small brain tumor which does not currently require any     intervention (being followed by Dr. Vickey Huger).  DISCHARGE DIAGNOSES: 1. Multivessel coronary artery disease (with an ejection fraction of     50-55%). 2. History of hypertension. 3. History of insulin-dependent diabetes mellitus (for approximately     20 years). 4. History of obesity. 5. History of hypothyroidism. 6. History of gastroesophageal reflux disease. 7. History of bilateral carotid artery stenoses (40-59% on the right,     60-79% on the left). 8. History of degenerative joint disease. 9. History of diverticulitis. 10.History of hiatal hernia. 11.Possible early dementia with mild memory loss. 12.Apparent small brain tumor which does not currently require any     intervention (being followed by Dr. Vickey Huger). 13.Atrial fibrillation with rapid ventricular response. 14.Acute blood loss anemia.  PROCEDURE:  Median sternotomy for CABG x4 (LIMA to LAD, SVG to diagonal 1, SVG to obtuse marginal 1, SVG to posterior  descending coronary artery with EVH by Dr. Dorris [name] on April 17, 2011).  HISTORY OF PRESENT ILLNESS:  This is a 77 year old Caucasian female, patient of Dr. Donnie Aho, with the aforementioned past medical history who began experiencing problems with her right shoulder.  She was going to be scheduled for right shoulder surgery but required preoperative clearance.  During her evaluation, a stress Cardiolite was done.  This was found to be abnormal with anterior septal infarction and peri- infarction ischemia noted with an EF of approximately 43%.  A cardiac catheterization was then performed by Dr. Donnie Aho on March 13, 2011. Results showed an EF of 50-55% with mild apical hypokinesis, severe 80- 90% stenosis prior to the diagonal and distal LAD, circumflex coronary artery had a 60% proximal stenosis, a 70-80% stenosis in the midportion of the circumflex prior to the marginal branch which also contained diffuse disease, the right coronary artery is calcified with a 90% stenosis prior to a tortuous posterior descending coronary artery and posterolateral branch.  A Cardiothoracic consultation was obtained with Dr. Dorris [name] for the consideration of coronary artery bypass grafting surgery.  She was initially seen and evaluated in the office by him on March 03, 2011.  Potential risks, complications and benefits of the surgery were discussed with the patient regarding necessitation for coronary artery bypass grafting surgery.  The patient agreed to  proceed. She was admitted to [facility] on April 17, 2011, to undergo a median sternotomy for coronary artery bypass grafting surgery.  BRIEF HOSPITAL COURSE STAY:  The patient was extubated without difficulty the evening of surgery.  She remained afebrile and hemodynamically stable.  She was on nitroglycerin and this was able to be weaned.  Her Swan-Ganz, A-line, foley, and chest tubes were all removed fairly early in her postoperative course.  She  was started on a low-dose beta-blocker, which was titrated accordingly.  She was found to be volume overloaded and diuresed accordingly.  She was able to be weaned off her insulin drip and was started on oral diabetic medications as taken preop.  She then went into atrial fibrillation with RVR.  She was initially started on amiodarone drip and her Lopressor was continued at 25 mg p.o. two times daily.  She then converted to sinus rhythm.  The amiodarone drip was stopped and she was maintained on oral amiodarone as well as her beta-blocker.  She was found to have acute blood loss anemia.  Her H and H went as low as 8.8 and 26.6.  She did not require postoperative transfusion.  Her last H and H was 9 and 27.9.  She was started on Nu-Iron.  She was felt surgically stable for transfer from the Intensive Care into [facility] for further convalescence on April 21, 2011.  She continued to progress with cardiac rehab as well as Physical Therapy.  She did experience hypoglycemia.  As a result, her metformin and glipizide were discontinued but she was continued on her Actos 30 mg p.o. daily.  Currently on postop day #5, her complaints include that of a sore throat incisional pain.  Vital signs are as follows.  She is afebrile, heart rate in the 60s to occasional 70, BP 152/67, O2 sat 93% on room air.  Preop weight 76 kg, today's weight 81.1 kg, CBGs 134, 155 and 82.  PHYSICAL EXAMINATION:  CARDIOVASCULAR:  Sinus brady. PULMONARY:  Slightly decreased at the bases.  No rales or rhonchi. ABDOMEN:  Soft, nontender.  Bowel sounds present. EXTREMITIES:  Positive lower extremity edema bilaterally.  Wounds are clean and dry.  The patient has already been tolerating a diet and has had a bowel movement.  Provided she remains in sinus rhythm, afebrile, hemodynamically stable, and pending morning round evaluation, she will be surgically stable for discharge to an SNF on April 23, 2011.  Prior to her discharge,  however, chest tube sutures and epicardial pacing wires will be removed.  LATEST LABORATORY STUDIES:  BMET done April 22, 2011, potassium 4.6, BUN and creatinine 20 and 1.12.  Sodium of 139.  CBC on this date H and H 9 and 27.9, white count of 8300, platelet count 216,000.  Chest x-ray done today showed no pneumothorax, small bilateral pleural effusions left greater than right with bibasilar atelectasis and improvement in overall aeration.  DISCHARGE INSTRUCTIONS:  DIET:  The patient should remain on a low-sodium, heart-healthy, diabetic diet.  ACTIVITY:  The patient may walk up steps.  She may shower.  She is not to lift more than 10 pounds for 4 weeks.  She is not to drive until after 4 weeks.  She is to continue with her breathing exercise daily. She is to walk daily and increase her frequency of duration as tolerates.  WOUND CARE:  She is to use soap and water on her wound.  She is to contact the office if any wound problems  arise.  FOLLOW-UP APPOINTMENTS: 1. The patient needs to contact Dr. York Spaniel office for followup     appointment in 2 weeks. 2. The patient has an appointment to see Dr. Dorris [name] on May 12, 2011, at 10:15 a.m.  45 minutes prior to this office appointment, a     chest x-ray should be obtained. 3. The patient needs to contact her medical doctor for an appointment     for further diabetes management.  MEDICATIONS AT DISCHARGE: 1. Amiodarone 200 mg p.o. two times daily. 2  Enteric-coated aspirin 325 mg p.o. daily. 1. Lasix 40 mg p.o. daily x7 days. 2. Potassium chloride 20 mEq p.o. daily x7 days. 3. Robitussin DM syrup 15 mL p.o. q. 4 h. p.r.n. cough. 4. NovoLog insulin 2-24 units subcu before meals and at bedtime.     Please see sliding scale included with the discharge medications. 5. Lopressor 25 mg p.o. two times daily. 6. Nu-Iron 150 mg p.o. daily. 7. Crestor 10 mg p.o. daily. 8. Ultram 50 mg 1-2 tablets p.o. q. 4-6 h. p.r.n. pain. 9.  Actos 30 mg p.o. q.a.m. 10.Felodipine 5 mg p.o. q.a.m. 11.Fluoxetine 20 mg p.o. q.a.m. 12.Glipizide 10 mg p.o. two times daily. 13.Levothyroxine 115 mcg p.o. q.a.m. 14.Bimatoprost 0.03% 1 drop both eyes daily at bedtime. 15.Metformin 500 mg p.o. two times daily. 16.Omeprazole 20 mg p.o. daily. 17.Quetiapine 25 mg p.o. at bedtime (take 1 hour prior to bedtime). 18.Vitamin C 1000 mg p.o. daily three times per week. 19.Vitamin D2 50,000 units p.o. every Sunday and Thursday. 20.Vitamin D3 2000 units 1 capsule p.o. q.  Monday, Tuesday,     Wednesday, Friday and Saturday. 21.Ambien 5 mg p.o. at bedtime p.r.n. sleep.    Doree Fudge, PA   ______________________________ Salvatore Decent [name] [name], M.D.   DZ/MEDQ  D:  04/22/2011  T:  04/22/2011  Job:  [id number]  cc:   Georga Hacking, M.D.  Electronically Signed by Doree Fudge PA on 04/23/2011 12:01:01 PM Electronically Signed by Charlett Lango M.D. on 05/05/2011 01:30:36 PM

## 2011-05-08 ENCOUNTER — Other Ambulatory Visit: Payer: Self-pay | Admitting: Thoracic Surgery (Cardiothoracic Vascular Surgery)

## 2011-05-08 DIAGNOSIS — I2510 Atherosclerotic heart disease of native coronary artery without angina pectoris: Secondary | ICD-10-CM

## 2011-05-09 ENCOUNTER — Other Ambulatory Visit: Payer: Self-pay | Admitting: Thoracic Surgery (Cardiothoracic Vascular Surgery)

## 2011-05-09 ENCOUNTER — Encounter: Payer: Self-pay | Admitting: Thoracic Surgery (Cardiothoracic Vascular Surgery)

## 2011-05-09 DIAGNOSIS — I2510 Atherosclerotic heart disease of native coronary artery without angina pectoris: Secondary | ICD-10-CM

## 2011-05-09 DIAGNOSIS — I110 Hypertensive heart disease with heart failure: Secondary | ICD-10-CM | POA: Insufficient documentation

## 2011-05-09 DIAGNOSIS — E669 Obesity, unspecified: Secondary | ICD-10-CM | POA: Insufficient documentation

## 2011-05-09 DIAGNOSIS — F329 Major depressive disorder, single episode, unspecified: Secondary | ICD-10-CM | POA: Insufficient documentation

## 2011-05-09 DIAGNOSIS — K449 Diaphragmatic hernia without obstruction or gangrene: Secondary | ICD-10-CM | POA: Insufficient documentation

## 2011-05-09 DIAGNOSIS — F0390 Unspecified dementia without behavioral disturbance: Secondary | ICD-10-CM

## 2011-05-09 DIAGNOSIS — I6523 Occlusion and stenosis of bilateral carotid arteries: Secondary | ICD-10-CM

## 2011-05-09 DIAGNOSIS — M1990 Unspecified osteoarthritis, unspecified site: Secondary | ICD-10-CM | POA: Insufficient documentation

## 2011-05-09 DIAGNOSIS — I779 Disorder of arteries and arterioles, unspecified: Secondary | ICD-10-CM | POA: Insufficient documentation

## 2011-05-09 DIAGNOSIS — K219 Gastro-esophageal reflux disease without esophagitis: Secondary | ICD-10-CM | POA: Insufficient documentation

## 2011-05-09 DIAGNOSIS — E079 Disorder of thyroid, unspecified: Secondary | ICD-10-CM

## 2011-05-09 DIAGNOSIS — I10 Essential (primary) hypertension: Secondary | ICD-10-CM

## 2011-05-09 DIAGNOSIS — F32A Depression, unspecified: Secondary | ICD-10-CM

## 2011-05-09 DIAGNOSIS — I4891 Unspecified atrial fibrillation: Secondary | ICD-10-CM

## 2011-05-09 DIAGNOSIS — IMO0001 Reserved for inherently not codable concepts without codable children: Secondary | ICD-10-CM | POA: Insufficient documentation

## 2011-05-12 ENCOUNTER — Ambulatory Visit (INDEPENDENT_AMBULATORY_CARE_PROVIDER_SITE_OTHER): Payer: Self-pay | Admitting: Thoracic Surgery (Cardiothoracic Vascular Surgery)

## 2011-05-12 ENCOUNTER — Encounter: Payer: Self-pay | Admitting: Thoracic Surgery (Cardiothoracic Vascular Surgery)

## 2011-05-12 ENCOUNTER — Ambulatory Visit
Admission: RE | Admit: 2011-05-12 | Discharge: 2011-05-12 | Disposition: A | Discharge status: Home / Self Care | Payer: Medicare Other | Source: Ambulatory Visit | Attending: Thoracic Surgery (Cardiothoracic Vascular Surgery) | Admitting: Thoracic Surgery (Cardiothoracic Vascular Surgery)

## 2011-05-12 VITALS — BP 148/62 | HR 52 | Temp 97.6°F | Resp 16 | Ht 60.0 in | Wt 170.0 lb

## 2011-05-12 DIAGNOSIS — I2510 Atherosclerotic heart disease of native coronary artery without angina pectoris: Secondary | ICD-10-CM

## 2011-05-12 MED ORDER — FUROSEMIDE 40 MG PO TABS: 40.0000 mg | ORAL_TABLET | Freq: Every day | ORAL | Status: DC

## 2011-05-12 NOTE — Progress Notes (Signed)
HPI [name] [name] is a 77 year old woman who had coronary bypass grafting x4 on August 2. She had a history of early dementia and there was considerable considerable concern about possible adverse neurologic outcome however she did quite well in the hospital without any overt neurologic complications she was discharged [facility] rehabilitation center where she has been since she left the hospital. She states that she's been having some back discomfort but no incisional pain she has noted significant swelling in her legs over the past week she also says that she's been short of breath last 3 nights when she first goes to bed, she not sure quite how long it lasts. there is no pain component. She has not had any anginal type discomfort she has not been taking any medication for her back pain. She was started on iron about a week and a half ago a few days later she noted a new rash on her hips back and abdomen this was raised and there were hydatids associated with it she had severe itching. She was given Atarax and discontinued the iron and her rashes subsequently resolved  Current Outpatient Prescriptions  Medication Sig Dispense Refill  . amiodarone (PACERONE) 200 MG tablet Take 200 mg by mouth 2 (two) times daily.        [name] ascorbic acid (VITAMIN C) 1000 MG tablet Take 1,000 mg by mouth 1 day or 1 dose. 3 times a week       . aspirin 325 MG tablet Take 325 mg by mouth daily.        . bimatoprost (LUMIGAN) 0.03 % ophthalmic solution Place 1 drop into both eyes at bedtime.        . calcium-vitamin D (OSCAL WITH D) 500-200 MG-UNIT per tablet Take 1 tablet by mouth 1 day or 1 dose. On Thursday and Sundays       . cholecalciferol (VITAMIN D) 1000 UNITS tablet Take 1,000 Units by mouth daily.        . cycloSPORINE (RESTASIS) 0.05 % ophthalmic emulsion Place 1 drop into both eyes 2 (two) times daily.        . felodipine (PLENDIL) 5 MG 24 hr tablet Take 5 mg by mouth daily.        [name] FLUoxetine (PROZAC) 20 MG  capsule Take 20 mg by mouth daily.        [name] glipiZIDE (GLUCOTROL) 10 MG tablet Take 10 mg by mouth 2 (two) times daily before a meal.        . levothyroxine (SYNTHROID, LEVOTHROID) 150 MCG tablet Take 150 mcg by mouth daily.        . metFORMIN (GLUCOPHAGE) 500 MG tablet Take 500 mg by mouth 2 (two) times daily with a meal.        . metoprolol tartrate (LOPRESSOR) 25 MG tablet Take 25 mg by mouth 2 (two) times daily.        [name] omeprazole (PRILOSEC) 20 MG capsule Take 20 mg by mouth daily.        . pioglitazone (ACTOS) 30 MG tablet Take 30 mg by mouth daily.        . QUEtiapine (SEROQUEL) 25 MG tablet Take 25 mg by mouth at bedtime.        . rosuvastatin (CRESTOR) 10 MG tablet Take 10 mg by mouth daily.        . traMADol (ULTRAM) 50 MG tablet Take 50 mg by mouth every 6 (six) hours as needed.        [name]  cholecalciferol (VITAMIN D) 400 UNITS TABS Take by mouth.        . furosemide (LASIX) 40 MG tablet Take 1 tablet (40 mg total) by mouth daily.  30 tablet  11  . hydrochlorothiazide 25 MG tablet Take 25 mg by mouth daily.        . insulin glargine (LANTUS) 100 UNIT/ML injection Inject 10 Units into the skin at bedtime.        . potassium chloride SA (K-DUR,KLOR-CON) 20 MEQ tablet Take 20 mEq by mouth daily.            Physical Exam BP 148/62  Pulse 52  Temp 97.6 F (36.4 C)  Resp 16  Ht 5' (1.524 m)  Wt 170 lb (77.111 kg)  BMI 33.20 kg/m2  SpO2 96% Exam Ms. [name] [name] is a 77 year old woman in no acute distress he is alert and oriented x3 with no focal neurologic deficits Her lungs have diminished breath sounds at the left base Heart exam has a regular rate and rhythm with no rubs or murmurs Sternal incision is clean dry intact sternum is stable Leg incisions are healing well, she does have 2+ pitting edema in both lower extremities  Diagnostic tests:  Chest x-ray shows a moderate left pleural effusion  Impression: Overall sties and is doing extremely well to complete given her age and  associated comorbidity. She's having minimal discomfort and does not appear to have any adverse neurologic sequelae from surgery. She remains at [facility] but likely will be able to return home next week. She has an appointment with Dr. Donnie Aho later this week. She had an allergic reaction to Nu-iron which has been discontinued.   She is volume overloaded with peripheral edema and a left pleural effusion and that likely is the source of her shortness of breath when she first goes to bed at night. We will start her back on Lasix and potassium  Plan: Lasix 40 mg daily, K-Dur 20 milliequivalents daily. She is not to lift greater than 10 pounds for which another 3 weeks we will plan to see her back in 3 weeks with a repeat chest x-ray to followup on her left pleural effusion.

## 2011-05-12 NOTE — Discharge Summary (Signed)
  NAMEJANAYSHA, [name] [name] NO.:  [id number]  MEDICAL RECORD NO.:  [id number]  LOCATION:  2017                         FACILITY:  [facility]  PHYSICIAN:  Salvatore Decent. Dorris Fetch, M.D.DATE OF BIRTH:  1933-12-18  DATE OF ADMISSION:  04/17/2011 DATE OF DISCHARGE:                              DISCHARGE SUMMARY   ADDENDUM  Ms. [name] [name] has continued to make good progress.  She did have episode of bradycardia requiring adjustments to her beta-blocker as well as decrease in her amiodarone dosing.  Currently, she maintained sinus rhythm in the 60s to 70s.  The family and Social Work Service have been undergoing the process of planning her a skilled facility at time of discharge.  Tentatively, the plan, if she has no other difficulties, is for transfer to a facility on April 25, 2011.  CURRENT MEDICATIONS:  At the time of this dictation include the following: 1. Amiodarone 200 mg p.o. daily. 2. Enteric-coated aspirin 325 mg daily. 3. Cepacol p.r.n. throat lozeng. 4. Lasix 40 mg daily for 7 additional days. 5. Robitussin DM p.r.n. for cough q.4 hours. 6. Iron complex 150 mg daily with meals. 7. Levothyroxine 125 mcg daily which is a reduced dose. 8. Lopressor 12.5 mg twice daily. 9. Crestor 10 mg daily. 10.Klor-Con 20 mEq daily. 11.Tramadol 50 mg 1-2 every 4-6 hours p.r.n. for pain. 12.Actos 30 mg daily. 13.Felodipine 5 mg daily. 14.Fluoxetine 20 mg daily. 15.Lumigan 0.03% 1 drop in both eyes daily at bedtime. 16.Metformin 500 mg twice daily. 17.Omeprazole 20 mg daily. 18.Quetiapine 25 mg 1 hour prior to bedtime. 19.Vitamin C 1000 mg 3 times weekly. 20.Vitamin D2 50,000 units q. Sunday and Thursday. 21.Vitamin D3 2000 units q. Monday, Tuesday, Wednesday, Friday, and     Saturday. 22.Zolpidem 5 mg at bedtime p.r.n.  For the time being, she has not been restarted on her glipizide, hydrochlorothiazide, Lantus, Cipro, or amitriptyline.  These may require adjustment p.r.n.   Extra notation, Klor-Con 20 mEq daily should be for 7 days and then adjusted private by the nursing facility physician as needed.  There are no other significant changes to this discharge summary.  The only additional diagnosis is sinus bradycardia.  Please see the previously dictated summary for further details of this hospitalization.     Rowe Clack, P.A.-C.   ______________________________ Salvatore Decent Dorris Fetch, M.D.    Sherryll Burger  D:  04/24/2011  T:  04/25/2011  Job:  [id number]  cc:   Georga Hacking, M.D.  Electronically Signed by Gershon Crane P.A.-C. on 05/08/2011 01:46:11 PM Electronically Signed by Charlett Lango M.D. on 05/12/2011 10:16:02 AM

## 2011-05-12 NOTE — Patient Instructions (Signed)
Do not lift anything over 10 pounds until 6 weeks after operation  Take Lasix 40 mg daily and K-Dur daily

## 2011-05-22 ENCOUNTER — Other Ambulatory Visit: Payer: MEDICARE

## 2011-05-28 ENCOUNTER — Other Ambulatory Visit: Payer: Self-pay | Admitting: Thoracic Surgery (Cardiothoracic Vascular Surgery)

## 2011-05-28 DIAGNOSIS — I2510 Atherosclerotic heart disease of native coronary artery without angina pectoris: Secondary | ICD-10-CM

## 2011-06-02 ENCOUNTER — Ambulatory Visit (INDEPENDENT_AMBULATORY_CARE_PROVIDER_SITE_OTHER): Payer: Self-pay | Admitting: Surgical

## 2011-06-02 ENCOUNTER — Ambulatory Visit
Admission: RE | Admit: 2011-06-02 | Discharge: 2011-06-02 | Disposition: A | Discharge status: Home / Self Care | Payer: Medicare Other | Source: Ambulatory Visit | Attending: Thoracic Surgery (Cardiothoracic Vascular Surgery) | Admitting: Thoracic Surgery (Cardiothoracic Vascular Surgery)

## 2011-06-02 VITALS — BP 150/71 | HR 94 | Resp 16 | Ht 60.0 in | Wt 159.0 lb

## 2011-06-02 DIAGNOSIS — I2510 Atherosclerotic heart disease of native coronary artery without angina pectoris: Secondary | ICD-10-CM

## 2011-06-02 NOTE — Progress Notes (Signed)
HPI: Patient returns for routine postoperative follow-up having undergone coronary artery bypass grafting x4 on 04/17/2011 for three-vessel coronary disease. The patient's early postoperative recovery while in the hospital was notable for atrial fibrillation and acute blood loss anemia. Since hospital discharge the patient reports she has continued to make slow overall recovery at the time of discharge she was placed in a short-term nursing facility. Since that time she has been discharged to home approximately 2 weeks ago. She has been doing better in regards to her  physical rehabilitation, but he is struggling with her mobilization. She uses a walker but has for some time due to multiple orthopedic issues related to previous hip surgery as well as chronic shoulder difficulties. She'll see him by Dr. Dorris Fetch on 827 and was placed on a short-term course of Lasix at that time for volume overload. She is improved in this regard and has no significant shortness of breath. She denies fevers chills or other constitutional symptoms at this time. Her lower leg endovascular vein harvest site has a superficial amount of scabbing but no evidence of infection. She is scheduled to start cardiac rehabilitation soon however there are some issues related to insurance coverage. The family is trying to get this resolved.   Current Outpatient Prescriptions  Medication Sig Dispense Refill  . amiodarone (PACERONE) 200 MG tablet Take 200 mg by mouth daily.        [name] ascorbic acid (VITAMIN C) 1000 MG tablet Take 1,000 mg by mouth 1 day or 1 dose. 3 times a week       . aspirin 325 MG tablet Take 325 mg by mouth daily.        . bimatoprost (LUMIGAN) 0.03 % ophthalmic solution Place 1 drop into both eyes at bedtime.        . calcium-vitamin D (OSCAL WITH D) 500-200 MG-UNIT per tablet Take 1 tablet by mouth 1 day or 1 dose. On Thursday and Sundays       . cycloSPORINE (RESTASIS) 0.05 % ophthalmic emulsion Place 1 drop  into both eyes 2 (two) times daily.        . ergocalciferol (VITAMIN D2) 50000 UNITS capsule Take 50,000 Units by mouth once a week. Takes Thursday and Sunday        . felodipine (PLENDIL) 5 MG 24 hr tablet Take 5 mg by mouth daily.        [name] FLUoxetine (PROZAC) 20 MG capsule Take 20 mg by mouth daily.        . furosemide (LASIX) 40 MG tablet Take 1 tablet (40 mg total) by mouth daily.  30 tablet  11  . glipiZIDE (GLUCOTROL) 10 MG tablet Take 10 mg by mouth 2 (two) times daily before a meal.        . insulin glargine (LANTUS) 100 UNIT/ML injection Inject 10 Units into the skin at bedtime.        [name] levothyroxine (SYNTHROID, LEVOTHROID) 150 MCG tablet Take 150 mcg by mouth daily.        . metFORMIN (GLUCOPHAGE) 500 MG tablet Take 500 mg by mouth 2 (two) times daily with a meal.        . metoprolol tartrate (LOPRESSOR) 25 MG tablet Take 25 mg by mouth 2 (two) times daily.        [name] omeprazole (PRILOSEC) 20 MG capsule Take 20 mg by mouth daily.        . pioglitazone (ACTOS) 30 MG tablet Take 30 mg by mouth daily.        [name]  potassium chloride SA (K-DUR,KLOR-CON) 20 MEQ tablet Take 20 mEq by mouth daily.        . QUEtiapine (SEROQUEL) 25 MG tablet Take 25 mg by mouth at bedtime.        . rosuvastatin (CRESTOR) 10 MG tablet Take 10 mg by mouth daily.        . traMADol (ULTRAM) 50 MG tablet Take 50 mg by mouth every 6 (six) hours as needed.        [name] amiodarone (PACERONE) 200 MG tablet Take 200 mg by mouth 2 (two) times daily.        . cholecalciferol (VITAMIN D) 1000 UNITS tablet Take 1,000 Units by mouth daily.        . cholecalciferol (VITAMIN D) 400 UNITS TABS Take by mouth.        . hydrochlorothiazide 25 MG tablet Take 25 mg by mouth daily.          Physical Exam: General appearance: Elderly white female in no acute distress. Pulmonary examination: Clear lung fields bilaterally with slightly diminished breath sounds in the left base Cardiac exam: Regular rate and rhythm normal S1-S2 no murmurs  gallops or rubs. Incisions: Sternal incision is well healed. The lower extremity endovascular vein harvest site shows superficial crusting and scabbing. There is no erythema. There is no purulent drainage I debrided some of the superficial material during the visit. Extremities: No edema  Diagnostic Tests: A chest x-ray was obtained on today's date. It shows resolution of the right pleural effusion. The left pleural effusion is improved and small.  Impression: The patient is showing continued ongoing surgical recovery. We discussed driving. She is not quite ready in her mind to begin at this time. We discussed our routine protocol when she does feel ready to start. We recommend that she continues to pursue the cardiac rehabilitation phase 2 program. She intends to do this. She is scheduled to see Dr. Arlyn Leak in his office next week for ongoing cardiology management. She is also scheduled to followup with her primary care physician next week for ongoing management of her diabetes and other chronic illness.   Plan: We will see her again in the office on a when necessary basis for any surgical related issues. In regards to her [facility] site I discussed keeping them clean and dry.

## 2011-06-17 ENCOUNTER — Ambulatory Visit (INDEPENDENT_AMBULATORY_CARE_PROVIDER_SITE_OTHER): Payer: Self-pay | Admitting: Thoracic Surgery (Cardiothoracic Vascular Surgery)

## 2011-06-17 ENCOUNTER — Encounter: Payer: Self-pay | Admitting: Thoracic Surgery (Cardiothoracic Vascular Surgery)

## 2011-06-17 VITALS — BP 181/69 | HR 68 | Resp 16 | Ht 60.0 in | Wt 161.0 lb

## 2011-06-17 DIAGNOSIS — T8189XA Other complications of procedures, not elsewhere classified, initial encounter: Secondary | ICD-10-CM

## 2011-06-17 DIAGNOSIS — I2510 Atherosclerotic heart disease of native coronary artery without angina pectoris: Secondary | ICD-10-CM

## 2011-06-17 NOTE — Progress Notes (Signed)
PCP is Darnelle Bos, MD, MD Referring Provider is Darnelle Bos,*  Chief Complaint  Patient presents with  . Follow-up    bilateral leg wounds    HPI: [name] [name] is a 77 year old woman who had coronary artery bypass grafting x4 on August 2, she had vein harvest from both legs endoscopically at the time of surgery. She was seen in the office 2 weeks ago which time she was noted to have a wound breakdown of the septum is vein harvest incisions just below the knee. These were debrided and been treated with Aquaphor dressing changes with a home health nurse. [name] [name] states that since she had been discharged the hospital she did have a fall. She was getting up and lost balance her left foot slipped and she fell on her left side. She did not have any loss of consciousness or head trauma or any dizziness receiving the fall. She has minimal discomfort and is not taking any narcotics for pain.  She did note some redness and drainage from around the incisions on her legs prior to the debridement 2 weeks ago she states that there is been little change since the previous debridement.  Past Medical History  Diagnosis Date  . Hypertension   . Thyroid disease   . Depression   . IDDM (insulin dependent diabetes mellitus)   . CAD (coronary artery disease)   . GERD (gastroesophageal reflux disease)   . Bilateral carotid artery stenosis   . Diverticulitis   . Obesity   . Degenerative joint disease   . Hiatal hernia   . Dementia     early   . Atrial fibrillation with rapid ventricular response     Past Surgical History  Procedure Date  . Cabg x4 [id number]    LT  internal mammary artery to left anterior descending, saphenous vein graft first diagonal, saphenous  vein graft to obtuse marginal 1, saphenous vein graft to posterior descending  . Median sternotomy [id number]    No family history on file.  Social History History  Substance Use Topics  . Smoking status: Former Smoker     Quit date: 09/15/1978  . Smokeless tobacco: Not on file  . Alcohol Use: No    Current Outpatient Prescriptions  Medication Sig Dispense Refill  . amiodarone (PACERONE) 200 MG tablet Take 200 mg by mouth 2 (two) times daily.        [name] amiodarone (PACERONE) 200 MG tablet Take 200 mg by mouth daily.        [name] ascorbic acid (VITAMIN C) 1000 MG tablet Take 1,000 mg by mouth 1 day or 1 dose. 3 times a week       . aspirin 325 MG tablet Take 325 mg by mouth daily.        . bimatoprost (LUMIGAN) 0.03 % ophthalmic solution Place 1 drop into both eyes at bedtime.        . calcium-vitamin D (OSCAL WITH D) 500-200 MG-UNIT per tablet Take 1 tablet by mouth 1 day or 1 dose. On Thursday and Sundays       . cholecalciferol (VITAMIN D) 1000 UNITS tablet Take 1,000 Units by mouth daily.        . cholecalciferol (VITAMIN D) 400 UNITS TABS Take by mouth.        . ergocalciferol (VITAMIN D2) 50000 UNITS capsule Take 50,000 Units by mouth once a week. Takes Thursday and Sunday        . felodipine (PLENDIL) 5 MG 24  hr tablet Take 5 mg by mouth daily.        [name] FLUoxetine (PROZAC) 20 MG capsule Take 20 mg by mouth daily.        [name] glipiZIDE (GLUCOTROL) 10 MG tablet Take 10 mg by mouth 2 (two) times daily before a meal.        . hydrochlorothiazide 25 MG tablet Take 25 mg by mouth daily.        . insulin glargine (LANTUS) 100 UNIT/ML injection Inject 10 Units into the skin at bedtime.        [name] levothyroxine (SYNTHROID, LEVOTHROID) 150 MCG tablet Take 150 mcg by mouth daily.        . metFORMIN (GLUCOPHAGE) 500 MG tablet Take 500 mg by mouth 2 (two) times daily with a meal.        . metoprolol tartrate (LOPRESSOR) 25 MG tablet Take 25 mg by mouth 2 (two) times daily.        [name] omeprazole (PRILOSEC) 20 MG capsule Take 20 mg by mouth daily.        . pioglitazone (ACTOS) 30 MG tablet Take 30 mg by mouth daily.        . QUEtiapine (SEROQUEL) 25 MG tablet Take 25 mg by mouth at bedtime.        . rosuvastatin (CRESTOR) 10  MG tablet Take 10 mg by mouth daily.        . traMADol (ULTRAM) 50 MG tablet Take 50 mg by mouth every 6 (six) hours as needed.        . cycloSPORINE (RESTASIS) 0.05 % ophthalmic emulsion Place 1 drop into both eyes 2 (two) times daily.        . furosemide (LASIX) 40 MG tablet Take 1 tablet (40 mg total) by mouth daily.  30 tablet  11  . potassium chloride SA (K-DUR,KLOR-CON) 20 MEQ tablet Take 20 mEq by mouth daily.          Allergies  Allergen Reactions  . Nu-Iron Rash  . Vicodin (Hydrocodone-Acetaminophen)     hallucinations    Review of Systems: Denies chest pain, shortness of breath, incisional pain, fevers or chills  BP 181/69  Pulse 68  Resp 16  Ht 5' (1.524 m)  Wt 161 lb (73.029 kg)  BMI 31.44 kg/m2  SpO2 95% Physical Exam: 77 year old woman in no acute distress.  Sternal wound intact sternum is stable.  Cardiac regular rate and rhythm normal S1 and to 2/6 systolic murmur no rubs or gallops  Lungs clear with equal breath sounds bilaterally.  Both lower extremities with open wounds 80 a day present after dressings removed. The underlying granulation tissue. Trace to 1+ edema bilaterally. no purulent drainage. No significant erythema.  Diagnostic Tests: None  Impression: 77 year old woman status post coronary bypass grafting with wound dehiscence of saphenous vein neck and the site on both lower legs. Superficial exudate is debrided underlying granulation tissue was present. No evidence of active infection  Plan: Normal saline wet to dry dressing changes by home health daily  Her I will see her back in the office next week to recheck the wounds

## 2011-06-24 ENCOUNTER — Ambulatory Visit: Payer: Self-pay | Admitting: Thoracic Surgery (Cardiothoracic Vascular Surgery)

## 2011-07-01 ENCOUNTER — Ambulatory Visit (INDEPENDENT_AMBULATORY_CARE_PROVIDER_SITE_OTHER): Payer: Self-pay | Admitting: Thoracic Surgery (Cardiothoracic Vascular Surgery)

## 2011-07-01 ENCOUNTER — Encounter: Payer: Self-pay | Admitting: Thoracic Surgery (Cardiothoracic Vascular Surgery)

## 2011-07-01 VITALS — BP 165/70 | HR 60 | Resp 16 | Ht 60.0 in | Wt 160.0 lb

## 2011-07-01 DIAGNOSIS — I2510 Atherosclerotic heart disease of native coronary artery without angina pectoris: Secondary | ICD-10-CM

## 2011-07-01 NOTE — Progress Notes (Signed)
PCP is Darnelle Bos, MD, MD Referring Provider is Darnelle Bos,*  Chief Complaint  Patient presents with  . Follow-up    EVH-BILATERAL LEG WOUNDS    HPI: [name] returns to the office today to followup on her leg wounds. She had coronary bypass grafting on August 2. She had endoscopic vein harvest from both legs at the time of surgery. Both leg wounds, which are just below the knee, broke down in the postoperative period and required debridement. They were initially treated with Aquaphor dressing changes, but we saw her in the office last week and change normal saline wet to dries. She states that she's been changing this on a daily basis and also cleaning with peroxide on a daily basis.  Other than the leg wound she is doing well and has no complaints  Past Medical History  Diagnosis Date  . Hypertension   . Thyroid disease   . Depression   . IDDM (insulin dependent diabetes mellitus)   . CAD (coronary artery disease)   . GERD (gastroesophageal reflux disease)   . Bilateral carotid artery stenosis   . Diverticulitis   . Obesity   . Degenerative joint disease   . Hiatal hernia   . Dementia     early   . Atrial fibrillation with rapid ventricular response     Past Surgical History  Procedure Date  . Cabg x4 [id number]    LT  internal mammary artery to left anterior descending, saphenous vein graft first diagonal, saphenous  vein graft to obtuse marginal 1, saphenous vein graft to posterior descending  . Median sternotomy [id number]    No family history on file.  Social History History  Substance Use Topics  . Smoking status: Former Smoker    Quit date: 09/15/1978  . Smokeless tobacco: Not on file  . Alcohol Use: No    Current Outpatient Prescriptions  Medication Sig Dispense Refill  . amiodarone (PACERONE) 200 MG tablet Take 200 mg by mouth 3 (three) times a week.       [name] ascorbic acid (VITAMIN C) 1000 MG tablet Take 1,000 mg by mouth 1 day or 1  dose. 3 times a week       . aspirin 325 MG tablet Take 81 mg by mouth daily.       . bimatoprost (LUMIGAN) 0.03 % ophthalmic solution Place 1 drop into both eyes at bedtime.        . calcium-vitamin D (OSCAL WITH D) 500-200 MG-UNIT per tablet Take 1 tablet by mouth 1 day or 1 dose. On Thursday and Sundays       . cholecalciferol (VITAMIN D) 1000 UNITS tablet Take 1,000 Units by mouth daily.        . cholecalciferol (VITAMIN D) 400 UNITS TABS Take by mouth.        . ergocalciferol (VITAMIN D2) 50000 UNITS capsule Take 50,000 Units by mouth once a week. Takes Thursday and Sunday        . felodipine (PLENDIL) 5 MG 24 hr tablet Take 5 mg by mouth daily.        [name] FLUoxetine (PROZAC) 20 MG capsule Take 20 mg by mouth daily.        . furosemide (LASIX) 40 MG tablet Take 1 tablet (40 mg total) by mouth daily.  30 tablet  11  . glipiZIDE (GLUCOTROL) 10 MG tablet Take 10 mg by mouth 2 (two) times daily before a meal.        .  hydrochlorothiazide 25 MG tablet Take 25 mg by mouth daily.        . insulin glargine (LANTUS) 100 UNIT/ML injection Inject 10 Units into the skin at bedtime.        [name] levothyroxine (SYNTHROID, LEVOTHROID) 150 MCG tablet Take 125 mcg by mouth daily.       . metFORMIN (GLUCOPHAGE) 500 MG tablet Take 500 mg by mouth 2 (two) times daily with a meal.        . metoprolol tartrate (LOPRESSOR) 25 MG tablet Take 25 mg by mouth 2 (two) times daily.        [name] omeprazole (PRILOSEC) 20 MG capsule Take 20 mg by mouth daily.        . pioglitazone (ACTOS) 30 MG tablet Take 30 mg by mouth daily.        . QUEtiapine (SEROQUEL) 25 MG tablet Take 25 mg by mouth at bedtime.        . rosuvastatin (CRESTOR) 10 MG tablet Take 10 mg by mouth daily.        . traMADol (ULTRAM) 50 MG tablet Take 50 mg by mouth every 6 (six) hours as needed.        [name] amiodarone (PACERONE) 200 MG tablet Take 200 mg by mouth daily.        . potassium chloride SA (K-DUR,KLOR-CON) 20 MEQ tablet Take 20 mEq by mouth daily.            Allergies  Allergen Reactions  . Nu-Iron Rash  . Vicodin (Hydrocodone-Acetaminophen)     hallucinations    Review of Systems: History of present illness  BP 165/70  Pulse 60  Resp 16  Ht 5' (1.524 m)  Wt 160 lb (72.576 kg)  BMI 31.25 kg/m2  SpO2 98% Physical Exam: Both leg wounds are open, both are clean and granulating, there is no evidence of infection  Diagnostic Tests: None  Impression: Slowly healing leg wounds after saphenous vein harvest for coronary bypass surgery. The wounds currently are clean and granulating. There is no evidence of ongoing infection.  Plan: Continue normal saline wet to dry dressing changes, only use peroxide if exudate present.  Return in 3 weeks for wound check, may call and cancel the appointment if wounds are completely healed by that time.

## 2011-07-04 ENCOUNTER — Other Ambulatory Visit: Payer: Self-pay

## 2011-07-07 ENCOUNTER — Ambulatory Visit (HOSPITAL_COMMUNITY): Payer: Medicare Other

## 2011-07-09 ENCOUNTER — Other Ambulatory Visit (INDEPENDENT_AMBULATORY_CARE_PROVIDER_SITE_OTHER): Payer: Medicare Other | Admitting: Vascular Surgery

## 2011-07-09 ENCOUNTER — Ambulatory Visit (HOSPITAL_COMMUNITY): Payer: Medicare Other

## 2011-07-09 DIAGNOSIS — I6529 Occlusion and stenosis of unspecified carotid artery: Secondary | ICD-10-CM

## 2011-07-11 ENCOUNTER — Ambulatory Visit (HOSPITAL_COMMUNITY): Payer: Medicare Other

## 2011-07-14 ENCOUNTER — Ambulatory Visit (HOSPITAL_COMMUNITY): Payer: Medicare Other

## 2011-07-15 ENCOUNTER — Other Ambulatory Visit: Payer: Self-pay | Admitting: Vascular Surgery

## 2011-07-15 DIAGNOSIS — I6529 Occlusion and stenosis of unspecified carotid artery: Secondary | ICD-10-CM

## 2011-07-15 NOTE — Procedures (Unsigned)
CAROTID DUPLEX EXAM  INDICATION:  Follow up carotid stenosis.  HISTORY: Diabetes:  Yes. Cardiac:  CABG, CAD. Hypertension:  Yes. Smoking:  Previous. Previous Surgery:  No carotid intervention. CV History:  Asymptomatic. Amaurosis Fugax No, Paresthesias No, Hemiparesis No.                                      RIGHT             LEFT Brachial systolic pressure:         170               176 Brachial Doppler waveforms:         WNL               WNL Vertebral direction of flow:        Antegrade         Antegrade DUPLEX VELOCITIES (cm/sec) CCA peak systolic                   87                98 ECA peak systolic                   321               215 ICA peak systolic                   225               227 ICA end diastolic                   52                70 PLAQUE MORPHOLOGY:                  Calcified         Calcified PLAQUE AMOUNT:                      Moderate          Moderate-to-severe PLAQUE LOCATION:                    CCA/ICA/ECA       CCA/ICA/ECA  IMPRESSION: 1. Right internal carotid artery stenosis present in the 40% to 59%     range. 2. Left internal carotid artery stenosis in the 60% to 79% range. 3. Bilateral internal carotid artery stenosis may be underestimated     due to dense calcified plaque and acoustic shadowing, making     Doppler interrogation difficult. 4. Bilateral external carotid artery stenosis present. 5. Essentially unchanged since the previous study on 10/18/2010.  ___________________________________________ Larina Earthly, M.D.  SH/MEDQ  D:  07/09/2011  T:  07/09/2011  Job:  [id number]

## 2011-07-16 ENCOUNTER — Ambulatory Visit (HOSPITAL_COMMUNITY): Payer: Medicare Other

## 2011-07-18 ENCOUNTER — Ambulatory Visit (HOSPITAL_COMMUNITY): Payer: Medicare Other

## 2011-07-21 ENCOUNTER — Ambulatory Visit (HOSPITAL_COMMUNITY): Payer: Medicare Other

## 2011-07-23 ENCOUNTER — Ambulatory Visit (HOSPITAL_COMMUNITY): Payer: Medicare Other

## 2011-07-24 ENCOUNTER — Encounter: Payer: Self-pay | Admitting: Thoracic Surgery (Cardiothoracic Vascular Surgery)

## 2011-07-25 ENCOUNTER — Ambulatory Visit (HOSPITAL_COMMUNITY): Payer: Medicare Other

## 2011-07-28 ENCOUNTER — Ambulatory Visit (HOSPITAL_COMMUNITY): Payer: Medicare Other

## 2011-07-30 ENCOUNTER — Ambulatory Visit (HOSPITAL_COMMUNITY): Payer: Medicare Other

## 2011-08-01 ENCOUNTER — Ambulatory Visit (HOSPITAL_COMMUNITY): Payer: Medicare Other

## 2011-08-04 ENCOUNTER — Ambulatory Visit (HOSPITAL_COMMUNITY): Payer: Medicare Other

## 2011-08-06 ENCOUNTER — Ambulatory Visit (HOSPITAL_COMMUNITY): Payer: Medicare Other

## 2011-08-08 ENCOUNTER — Ambulatory Visit (HOSPITAL_COMMUNITY): Payer: Medicare Other

## 2011-08-11 ENCOUNTER — Ambulatory Visit (HOSPITAL_COMMUNITY): Payer: Medicare Other

## 2011-08-12 ENCOUNTER — Other Ambulatory Visit: Payer: Self-pay | Admitting: Internal Medicine

## 2011-08-12 DIAGNOSIS — Z1231 Encounter for screening mammogram for malignant neoplasm of breast: Secondary | ICD-10-CM

## 2011-08-13 ENCOUNTER — Ambulatory Visit (INDEPENDENT_AMBULATORY_CARE_PROVIDER_SITE_OTHER): Payer: Medicare Other | Admitting: Thoracic Surgery (Cardiothoracic Vascular Surgery)

## 2011-08-13 ENCOUNTER — Encounter: Payer: Self-pay | Admitting: Thoracic Surgery (Cardiothoracic Vascular Surgery)

## 2011-08-13 ENCOUNTER — Ambulatory Visit (HOSPITAL_COMMUNITY): Payer: Medicare Other

## 2011-08-13 VITALS — BP 134/54 | HR 60 | Resp 20 | Ht 60.0 in | Wt 160.0 lb

## 2011-08-13 DIAGNOSIS — T8189XA Other complications of procedures, not elsewhere classified, initial encounter: Secondary | ICD-10-CM

## 2011-08-13 DIAGNOSIS — Z5189 Encounter for other specified aftercare: Secondary | ICD-10-CM

## 2011-08-13 DIAGNOSIS — Z951 Presence of aortocoronary bypass graft: Secondary | ICD-10-CM

## 2011-08-13 DIAGNOSIS — I2510 Atherosclerotic heart disease of native coronary artery without angina pectoris: Secondary | ICD-10-CM

## 2011-08-13 NOTE — Progress Notes (Signed)
[name] returns today for followup of her left leg wound. She had coronary bypass grafting on August 2. She developed wound breakdown of both legs at the incision saphenous vein harvest was started. These were treated with local wound care and were slow to heal. She notes that's continues to have particularly on the left leg the wound appeared heal over and then it will start draining and open again. She's not had any fevers or chills.  Current outpatient prescriptions:amiodarone (PACERONE) 200 MG tablet, Take 200 mg by mouth 3 (three) times a week. , Disp: , Rfl: ;  ascorbic acid (VITAMIN C) 1000 MG tablet, Take 1,000 mg by mouth 1 day or 1 dose. 3 times a week , Disp: , Rfl: ;  aspirin 325 MG tablet, Take 81 mg by mouth daily. , Disp: , Rfl: ;  bimatoprost (LUMIGAN) 0.03 % ophthalmic solution, Place 1 drop into both eyes at bedtime.  , Disp: , Rfl:  calcium-vitamin D (OSCAL WITH D) 500-200 MG-UNIT per tablet, Take 1 tablet by mouth 1 day or 1 dose. On Thursday and Sundays , Disp: , Rfl: ;  cholecalciferol (VITAMIN D) 1000 UNITS tablet, Take 1,000 Units by mouth daily.  , Disp: , Rfl: ;  cholecalciferol (VITAMIN D) 400 UNITS TABS, Take by mouth.  , Disp: , Rfl: ;  ergocalciferol (VITAMIN D2) 50000 UNITS capsule, Take 50,000 Units by mouth once a week. Takes Thursday and Sunday  , Disp: , Rfl:  felodipine (PLENDIL) 5 MG 24 hr tablet, Take 5 mg by mouth daily.  , Disp: , Rfl: ;  FLUoxetine (PROZAC) 20 MG capsule, Take 20 mg by mouth daily.  , Disp: , Rfl: ;  glipiZIDE (GLUCOTROL) 10 MG tablet, Take 10 mg by mouth 2 (two) times daily before a meal.  , Disp: , Rfl: ;  hydrochlorothiazide 25 MG tablet, Take 25 mg by mouth daily.  , Disp: , Rfl:  insulin glargine (LANTUS) 100 UNIT/ML injection, Inject 10 Units into the skin at bedtime.  , Disp: , Rfl: ;  levothyroxine (SYNTHROID, LEVOTHROID) 150 MCG tablet, Take 125 mcg by mouth daily. , Disp: , Rfl: ;  metFORMIN (GLUCOPHAGE) 500 MG tablet, Take 500 mg by mouth 2  (two) times daily with a meal.  , Disp: , Rfl: ;  metoprolol tartrate (LOPRESSOR) 25 MG tablet, Take 25 mg by mouth 2 (two) times daily.  , Disp: , Rfl:  omeprazole (PRILOSEC) 20 MG capsule, Take 20 mg by mouth daily.  , Disp: , Rfl: ;  pioglitazone (ACTOS) 30 MG tablet, Take 30 mg by mouth daily.  , Disp: , Rfl: ;  QUEtiapine (SEROQUEL) 25 MG tablet, Take 25 mg by mouth at bedtime.  , Disp: , Rfl: ;  rosuvastatin (CRESTOR) 10 MG tablet, Take 10 mg by mouth daily.  , Disp: , Rfl: ;  traMADol (ULTRAM) 50 MG tablet, Take 50 mg by mouth every 6 (six) hours as needed.  , Disp: , Rfl:   Exam BP 134/54  Pulse 60  Resp 20  Ht 5' (1.524 m)  Wt 160 lb (72.576 kg)  BMI 31.25 kg/m2  SpO2 97% Right lower extremity incision just inferior to the knee nearly completely healed with a very small eschar Left lower extremity open wound just below knee was some exudate, debridement revealed no foreign body, there was some necrotic subcutaneous fat. Wound packed with Nu Gauze.  Impression and plan:  Nonhealing wound left lower extremity. We will pack with quarter-inch Nu Gauze  on daily basis. I plan to see her back in 2 weeks to check on the progress of the wound.

## 2011-08-15 ENCOUNTER — Ambulatory Visit (HOSPITAL_COMMUNITY): Payer: Medicare Other

## 2011-08-18 ENCOUNTER — Ambulatory Visit (HOSPITAL_COMMUNITY): Payer: Medicare Other

## 2011-08-19 ENCOUNTER — Ambulatory Visit: Payer: Medicare Other | Admitting: Physical Therapy

## 2011-08-20 ENCOUNTER — Ambulatory Visit (HOSPITAL_COMMUNITY): Payer: Medicare Other

## 2011-08-22 ENCOUNTER — Ambulatory Visit (HOSPITAL_COMMUNITY): Payer: Medicare Other

## 2011-08-25 ENCOUNTER — Ambulatory Visit (HOSPITAL_COMMUNITY): Payer: Medicare Other

## 2011-08-26 ENCOUNTER — Ambulatory Visit: Payer: Medicare Other | Admitting: Physical Therapy

## 2011-08-27 ENCOUNTER — Ambulatory Visit (HOSPITAL_COMMUNITY): Payer: Medicare Other

## 2011-08-27 ENCOUNTER — Ambulatory Visit (INDEPENDENT_AMBULATORY_CARE_PROVIDER_SITE_OTHER): Payer: Medicare Other | Admitting: Thoracic Surgery (Cardiothoracic Vascular Surgery)

## 2011-08-27 ENCOUNTER — Encounter: Payer: Self-pay | Admitting: Thoracic Surgery (Cardiothoracic Vascular Surgery)

## 2011-08-27 VITALS — BP 180/73 | HR 60 | Resp 20 | Ht 60.0 in | Wt 160.0 lb

## 2011-08-27 DIAGNOSIS — I2510 Atherosclerotic heart disease of native coronary artery without angina pectoris: Secondary | ICD-10-CM

## 2011-08-27 DIAGNOSIS — IMO0001 Reserved for inherently not codable concepts without codable children: Secondary | ICD-10-CM

## 2011-08-27 DIAGNOSIS — Z951 Presence of aortocoronary bypass graft: Secondary | ICD-10-CM

## 2011-08-27 DIAGNOSIS — Z4800 Encounter for change or removal of nonsurgical wound dressing: Secondary | ICD-10-CM

## 2011-08-27 DIAGNOSIS — T8189XA Other complications of procedures, not elsewhere classified, initial encounter: Secondary | ICD-10-CM

## 2011-08-27 MED ORDER — POTASSIUM CHLORIDE CRYS ER 20 MEQ PO TBCR: 20.0000 meq | EXTENDED_RELEASE_TABLET | Freq: Every day | ORAL | Status: DC

## 2011-08-27 MED ORDER — FUROSEMIDE 40 MG PO TABS: 40.0000 mg | ORAL_TABLET | Freq: Every day | ORAL | Status: DC

## 2011-08-27 NOTE — Progress Notes (Signed)
HPI: [name] [name] returns today for followup of her left leg wound. She developed the dehiscence of her saphenous vein harvest incisions below the knee on both legs. At her last visit the right heel. There was still some drainage on the left a small area 2-3 mm in diameter the inferior aspect of the incision that was open. This was probed some necrotic fat was removed and she's been doing saline wet to dry dressing changes to that area. She says is still a fair amount of drainage. She is having pain in her heel and saw a physician about that yesterday. They have ABIs planned.   Current Outpatient Prescriptions  Medication Sig Dispense Refill  . amiodarone (PACERONE) 200 MG tablet Take 200 mg by mouth 3 (three) times a week.       [name] ascorbic acid (VITAMIN C) 1000 MG tablet Take 1,000 mg by mouth 1 day or 1 dose. 3 times a week       . aspirin 325 MG tablet Take 81 mg by mouth daily.       . bimatoprost (LUMIGAN) 0.03 % ophthalmic solution Place 1 drop into both eyes at bedtime.        . calcium-vitamin D (OSCAL WITH D) 500-200 MG-UNIT per tablet Take 1 tablet by mouth 1 day or 1 dose. On Thursday and Sundays       . cholecalciferol (VITAMIN D) 1000 UNITS tablet Take 1,000 Units by mouth daily.        . cholecalciferol (VITAMIN D) 400 UNITS TABS Take by mouth.        . doxycycline (DORYX) 100 MG EC tablet Take 100 mg by mouth 2 (two) times daily.        . ergocalciferol (VITAMIN D2) 50000 UNITS capsule Take 50,000 Units by mouth once a week. Takes Thursday and Sunday        . felodipine (PLENDIL) 5 MG 24 hr tablet Take 5 mg by mouth daily.        [name] FLUoxetine (PROZAC) 20 MG capsule Take 20 mg by mouth daily.        [name] glipiZIDE (GLUCOTROL) 10 MG tablet Take 10 mg by mouth 2 (two) times daily before a meal.        . hydrochlorothiazide 25 MG tablet Take 25 mg by mouth daily.        . insulin glargine (LANTUS) 100 UNIT/ML injection Inject 10 Units into the skin at bedtime.        [name] levothyroxine  (SYNTHROID, LEVOTHROID) 150 MCG tablet Take 125 mcg by mouth daily.       . metFORMIN (GLUCOPHAGE) 500 MG tablet Take 500 mg by mouth 2 (two) times daily with a meal.        . metoprolol tartrate (LOPRESSOR) 25 MG tablet Take 25 mg by mouth 2 (two) times daily.        [name] omeprazole (PRILOSEC) 20 MG capsule Take 20 mg by mouth daily.        . pioglitazone (ACTOS) 30 MG tablet Take 30 mg by mouth daily.        . QUEtiapine (SEROQUEL) 25 MG tablet Take 25 mg by mouth at bedtime.        . rosuvastatin (CRESTOR) 10 MG tablet Take 10 mg by mouth daily.        . traMADol (ULTRAM) 50 MG tablet Take 50 mg by mouth every 6 (six) hours as needed.        . furosemide (LASIX)  40 MG tablet Take 1 tablet (40 mg total) by mouth daily.  30 tablet  11  . potassium chloride SA (K-DUR,KLOR-CON) 20 MEQ tablet Take 1 tablet (20 mEq total) by mouth daily.  30 tablet  3     Physical Exam: BP 180/73  Pulse 60  Resp 20  Ht 5' (1.524 m)  Wt 160 lb (72.576 kg)  BMI 31.25 kg/m2  SpO2 97% Extremities 1+ edema bilaterally Right leg incision healed with minimal eschar Left leg incision open area approximately 3 mm diameter and 3-4 mm in depth. No erythema or purulence. Left heel blister/callus with cracked skin on the posterior aspect In both feet there is hyperemia.  Diagnostic Tests: none  Impression: Slow healing wound left leg following saphenous vein harvest. No sign of active infection. She does have some edema and that may be contributing to continuing drainage from the left leg wound. It would be reasonable to try her on some Lasix to see if that'll help speed the healing if we can get rid of the peripheral edema.  Plan: Lasix 40 mg daily and potassium 20 mEq once daily Continue normal saline Nu Gauze wet to dry dressing changes Return in one month for followup

## 2011-08-29 ENCOUNTER — Ambulatory Visit (HOSPITAL_COMMUNITY): Payer: Medicare Other

## 2011-09-01 ENCOUNTER — Ambulatory Visit (HOSPITAL_COMMUNITY): Payer: Medicare Other

## 2011-09-03 ENCOUNTER — Ambulatory Visit (HOSPITAL_COMMUNITY): Payer: Medicare Other

## 2011-09-04 ENCOUNTER — Ambulatory Visit: Payer: Medicare Other

## 2011-09-05 ENCOUNTER — Ambulatory Visit (HOSPITAL_COMMUNITY): Payer: Medicare Other

## 2011-09-08 ENCOUNTER — Ambulatory Visit (HOSPITAL_COMMUNITY): Payer: Medicare Other

## 2011-09-10 ENCOUNTER — Ambulatory Visit (HOSPITAL_COMMUNITY): Payer: Medicare Other

## 2011-09-12 ENCOUNTER — Ambulatory Visit (HOSPITAL_COMMUNITY): Payer: Medicare Other

## 2011-09-15 ENCOUNTER — Ambulatory Visit (HOSPITAL_COMMUNITY): Payer: Medicare Other

## 2011-09-17 ENCOUNTER — Ambulatory Visit (HOSPITAL_COMMUNITY): Payer: Medicare Other

## 2011-09-19 ENCOUNTER — Ambulatory Visit (HOSPITAL_COMMUNITY): Payer: Medicare Other

## 2011-09-22 ENCOUNTER — Ambulatory Visit (HOSPITAL_COMMUNITY): Payer: Medicare Other

## 2011-09-22 ENCOUNTER — Ambulatory Visit (INDEPENDENT_AMBULATORY_CARE_PROVIDER_SITE_OTHER): Payer: Medicare Other | Admitting: Thoracic Surgery (Cardiothoracic Vascular Surgery)

## 2011-09-22 ENCOUNTER — Encounter: Payer: Self-pay | Admitting: Thoracic Surgery (Cardiothoracic Vascular Surgery)

## 2011-09-22 VITALS — BP 166/70 | HR 90 | Resp 16 | Ht 60.0 in | Wt 163.0 lb

## 2011-09-22 DIAGNOSIS — L03116 Cellulitis of left lower limb: Secondary | ICD-10-CM

## 2011-09-22 DIAGNOSIS — Z951 Presence of aortocoronary bypass graft: Secondary | ICD-10-CM

## 2011-09-22 DIAGNOSIS — L02419 Cutaneous abscess of limb, unspecified: Secondary | ICD-10-CM

## 2011-09-22 DIAGNOSIS — I2510 Atherosclerotic heart disease of native coronary artery without angina pectoris: Secondary | ICD-10-CM

## 2011-09-22 DIAGNOSIS — L03119 Cellulitis of unspecified part of limb: Secondary | ICD-10-CM

## 2011-09-22 MED ORDER — DICLOXACILLIN SODIUM 500 MG PO CAPS: 500.0000 mg | ORAL_CAPSULE | Freq: Three times a day (TID) | ORAL | Status: DC

## 2011-09-22 NOTE — Progress Notes (Signed)
[name] [name] returns today in followup of her nonhealing left leg wound. She had coronary bypass grafting back in August. She developed dehiscence of the incisions below the knee on both sides where the saphenous vein was harvested. At her last visit about a month ago the incision on the right had healed, there was a very small cavity on the left side which is being packed with saline wet to dry gauze. She states that since her last visit the incision on the left side and closed up to the point where they were no longer able to get the gauze into it, but she would still notice a small amount of drainage from it periodically. She has noted some pain related to a pressure ulcer on the lateral aspect of her left heel. This morning she did note some drainage from the previously open wound. She denies any fevers or chills. She has not noticed any redness on the leg.  Current outpatient prescriptions:amiodarone (PACERONE) 200 MG tablet, Take 200 mg by mouth 3 (three) times a week. , Disp: , Rfl: ;  ascorbic acid (VITAMIN C) 1000 MG tablet, Take 1,000 mg by mouth 1 day or 1 dose. 3 times a week , Disp: , Rfl: ;  aspirin 325 MG tablet, Take 81 mg by mouth daily. , Disp: , Rfl: ;  bimatoprost (LUMIGAN) 0.03 % ophthalmic solution, Place 1 drop into both eyes at bedtime.  , Disp: , Rfl:  calcium-vitamin D (OSCAL WITH D) 500-200 MG-UNIT per tablet, Take 1 tablet by mouth 1 day or 1 dose. On Thursday and Sundays , Disp: , Rfl: ;  cholecalciferol (VITAMIN D) 1000 UNITS tablet, Take 1,000 Units by mouth daily.  , Disp: , Rfl: ;  cholecalciferol (VITAMIN D) 400 UNITS TABS, Take by mouth.  , Disp: , Rfl: ;  ergocalciferol (VITAMIN D2) 50000 UNITS capsule, Take 50,000 Units by mouth once a week. Takes Thursday and Sunday  , Disp: , Rfl:  felodipine (PLENDIL) 5 MG 24 hr tablet, Take 5 mg by mouth daily.  , Disp: , Rfl: ;  FLUoxetine (PROZAC) 20 MG capsule, Take 20 mg by mouth daily.  , Disp: , Rfl: ;  furosemide (LASIX) 40 MG  tablet, Take 1 tablet (40 mg total) by mouth daily., Disp: 30 tablet, Rfl: 11;  glipiZIDE (GLUCOTROL) 10 MG tablet, Take 10 mg by mouth 2 (two) times daily before a meal.  , Disp: , Rfl:  hydrochlorothiazide 25 MG tablet, Take 25 mg by mouth daily.  , Disp: , Rfl: ;  insulin glargine (LANTUS) 100 UNIT/ML injection, Inject 10 Units into the skin at bedtime.  , Disp: , Rfl: ;  levothyroxine (SYNTHROID, LEVOTHROID) 150 MCG tablet, Take 125 mcg by mouth daily. , Disp: , Rfl: ;  metFORMIN (GLUCOPHAGE) 500 MG tablet, Take 500 mg by mouth 2 (two) times daily with a meal.  , Disp: , Rfl:  metoprolol tartrate (LOPRESSOR) 25 MG tablet, Take 25 mg by mouth 2 (two) times daily.  , Disp: , Rfl: ;  omeprazole (PRILOSEC) 20 MG capsule, Take 20 mg by mouth daily.  , Disp: , Rfl: ;  pioglitazone (ACTOS) 30 MG tablet, Take 30 mg by mouth daily.  , Disp: , Rfl: ;  potassium chloride SA (K-DUR,KLOR-CON) 20 MEQ tablet, Take 1 tablet (20 mEq total) by mouth daily., Disp: 30 tablet, Rfl: 3 QUEtiapine (SEROQUEL) 25 MG tablet, Take 25 mg by mouth at bedtime.  , Disp: , Rfl: ;  rosuvastatin (CRESTOR) 10 MG  tablet, Take 10 mg by mouth daily.  , Disp: , Rfl: ;  traMADol (ULTRAM) 50 MG tablet, Take 50 mg by mouth every 6 (six) hours as needed.  , Disp: , Rfl: ;  dicloxacillin (DYNAPEN) 500 MG capsule, Take 1 capsule (500 mg total) by mouth 3 (three) times daily., Disp: 21 capsule, Rfl: 0  PE: BP 166/70  Pulse 90  Resp 16  Ht 5' (1.524 m)  Wt 163 lb (73.936 kg)  BMI 31.83 kg/m2  SpO2 95% Left leg incision with mild erythema, small amount of serous drainage with pressure.. Erythema, and mild edema in her left leg below the wound, circumferential to the ankle consistent with cellulitis. Does not appear to be related to her left heel ulcer.  Impression: Cellulitis left leg  Plan: Will treat with dicloxacillin 500 mg by mouth 3 times a day for 7 days I will plan to see her back in one week to check on the progress And she will let  us know if there is any worsening in the interim

## 2011-09-24 ENCOUNTER — Ambulatory Visit (HOSPITAL_COMMUNITY): Payer: Medicare Other

## 2011-09-26 ENCOUNTER — Ambulatory Visit (HOSPITAL_COMMUNITY): Payer: Medicare Other

## 2011-09-29 ENCOUNTER — Ambulatory Visit (HOSPITAL_COMMUNITY): Payer: Medicare Other

## 2011-09-29 ENCOUNTER — Encounter: Payer: Medicare Other | Admitting: Thoracic Surgery (Cardiothoracic Vascular Surgery)

## 2011-10-01 ENCOUNTER — Ambulatory Visit (HOSPITAL_COMMUNITY): Payer: Medicare Other

## 2011-10-01 ENCOUNTER — Other Ambulatory Visit: Payer: Self-pay | Admitting: Thoracic Surgery (Cardiothoracic Vascular Surgery)

## 2011-10-02 ENCOUNTER — Encounter: Payer: Self-pay | Admitting: Thoracic Surgery (Cardiothoracic Vascular Surgery)

## 2011-10-02 ENCOUNTER — Ambulatory Visit (INDEPENDENT_AMBULATORY_CARE_PROVIDER_SITE_OTHER): Payer: Medicare Other | Admitting: Thoracic Surgery (Cardiothoracic Vascular Surgery)

## 2011-10-02 VITALS — BP 160/70 | HR 60 | Resp 16

## 2011-10-02 DIAGNOSIS — L02419 Cutaneous abscess of limb, unspecified: Secondary | ICD-10-CM

## 2011-10-02 MED ORDER — DICLOXACILLIN SODIUM 500 MG PO CAPS: 500.0000 mg | ORAL_CAPSULE | Freq: Three times a day (TID) | ORAL | Status: DC

## 2011-10-02 NOTE — Progress Notes (Signed)
Patient ID: [name] [name], female   DOB: May 12, 1934, 78 y.o.   MRN: [id number] [name] [name] returns today for a wound check.  She had coronary bypass grafting several months ago and had slowly healing wounds on both legs below the knee. She was packing these wounds and the one on the right had healed, but the one on the left was slower to heal. Eventually though it appeared that the left leg wound had healed completely. However she came to the office on January 7 complaining of drainage from the wound and redness of her leg and foot. A small amount of purulent drainage was expressed from the wound and she clearly has cellulitis we've renewed packing of the wound and started on dicloxacillin 500 mg by mouth 3 times a day for 10 days. Today is the last every antibiotics. She states that she really hasn't been able to get any packing into the wound for the last several days. The redness and discomfort in her leg and foot has improved significantly but not resolved 100%.  BP 160/70  Pulse 60  Resp 16  SpO2 97% Her left leg has mild erythema and edema in the leg and foot. The wound was probed and a small pocket of purulent fluid was entered (proximally 2-3 cc of fluid).  Impression: Resolving cellulitis , Still with a slow healing wound of the left leg that was not being packed adequately..  Plan: I opened the wound enough that the packing to be placed in the bottom of the wound and demonstrated this to her. She needs to continue twice daily dressing changes with that. We'll give her another week of dicloxacillin to completely wipe out the infection hopefully.  I plan to see her back in 2 weeks. If we're still having difficulty and not showing adequate healing at that time I will recommend to her that we proceed with formal surgical incision and drainage in the operating room.

## 2011-10-03 ENCOUNTER — Ambulatory Visit (HOSPITAL_COMMUNITY): Payer: Medicare Other

## 2011-10-06 ENCOUNTER — Ambulatory Visit (HOSPITAL_COMMUNITY): Payer: Medicare Other

## 2011-10-08 ENCOUNTER — Ambulatory Visit (HOSPITAL_COMMUNITY): Payer: Medicare Other

## 2011-10-10 ENCOUNTER — Ambulatory Visit (HOSPITAL_COMMUNITY): Payer: Medicare Other

## 2011-10-10 ENCOUNTER — Ambulatory Visit (INDEPENDENT_AMBULATORY_CARE_PROVIDER_SITE_OTHER): Payer: Medicare Other | Admitting: Ophthalmology

## 2011-10-13 ENCOUNTER — Ambulatory Visit (INDEPENDENT_AMBULATORY_CARE_PROVIDER_SITE_OTHER): Payer: Medicare Other | Admitting: Physician Assistant

## 2011-10-13 VITALS — BP 153/66 | HR 70 | Resp 18 | Ht 61.0 in | Wt 163.0 lb

## 2011-10-13 DIAGNOSIS — I2510 Atherosclerotic heart disease of native coronary artery without angina pectoris: Secondary | ICD-10-CM

## 2011-10-13 DIAGNOSIS — L02419 Cutaneous abscess of limb, unspecified: Secondary | ICD-10-CM

## 2011-10-13 DIAGNOSIS — Z09 Encounter for follow-up examination after completed treatment for conditions other than malignant neoplasm: Secondary | ICD-10-CM

## 2011-10-13 DIAGNOSIS — L02416 Cutaneous abscess of left lower limb: Secondary | ICD-10-CM

## 2011-10-13 NOTE — Progress Notes (Signed)
HPI:  Patient returns for routine postoperative follow-up having undergone CABG x 4 04/17/2011. She was last seen in the office regarding cellulitis and dehiscence of the lower extremity wounds (from saphenous vein harvest). Was given a prescription for dicloxacillin. The patient states that she has had no further drainage from her left lower extremity wound And there is also decreasing cellulitis of the left lower extremity.Her right lower extremity wound has been well healed.  She denies any fever or chills.   Current Outpatient Prescriptions  Medication Sig Dispense Refill  . amiodarone (PACERONE) 200 MG tablet Take 200 mg by mouth 3 (three) times a week.       [name] ascorbic acid (VITAMIN C) 1000 MG tablet Take 1,000 mg by mouth 1 day or 1 dose. 3 times a week       . aspirin 325 MG tablet Take 81 mg by mouth daily.       . bimatoprost (LUMIGAN) 0.03 % ophthalmic solution Place 1 drop into both eyes at bedtime.        . calcium-vitamin D (OSCAL WITH D) 500-200 MG-UNIT per tablet Take 1 tablet by mouth 1 day or 1 dose. On Thursday and Sundays       . cholecalciferol (VITAMIN D) 1000 UNITS tablet Take 1,000 Units by mouth daily.        . cholecalciferol (VITAMIN D) 400 UNITS TABS Take by mouth.        . ergocalciferol (VITAMIN D2) 50000 UNITS capsule Take 50,000 Units by mouth once a week. Takes Thursday and Sunday        . felodipine (PLENDIL) 5 MG 24 hr tablet Take 5 mg by mouth daily.        [name] FLUoxetine (PROZAC) 20 MG capsule Take 20 mg by mouth daily.        . furosemide (LASIX) 40 MG tablet Take 1 tablet (40 mg total) by mouth daily.  30 tablet  11  . glipiZIDE (GLUCOTROL) 10 MG tablet Take 10 mg by mouth 2 (two) times daily before a meal.        . hydrochlorothiazide 25 MG tablet Take 25 mg by mouth daily.        . insulin glargine (LANTUS) 100 UNIT/ML injection Inject 10 Units into the skin at bedtime.        [name] levothyroxine (SYNTHROID, LEVOTHROID) 150 MCG tablet Take 125 mcg by mouth  daily.       . metFORMIN (GLUCOPHAGE) 500 MG tablet Take 500 mg by mouth 2 (two) times daily with a meal.        . metoprolol tartrate (LOPRESSOR) 25 MG tablet Take 25 mg by mouth 2 (two) times daily.        [name] omeprazole (PRILOSEC) 20 MG capsule Take 20 mg by mouth daily.        . pioglitazone (ACTOS) 30 MG tablet Take 30 mg by mouth daily.        . potassium chloride SA (K-DUR,KLOR-CON) 20 MEQ tablet Take 1 tablet (20 mEq total) by mouth daily.  30 tablet  3  . QUEtiapine (SEROQUEL) 25 MG tablet Take 25 mg by mouth at bedtime.        . rosuvastatin (CRESTOR) 10 MG tablet Take 10 mg by mouth daily.        . traMADol (ULTRAM) 50 MG tablet Take 50 mg by mouth every 6 (six) hours as needed.        Vital Signs: Personal and 153/ 66, heart  rate 70, respiratory 18, O2 sat 96% on room air.  Physical Exam: Lower extremity wounds: Right lower extremity wound clean, dry, well-healed. Mild erythema right foot. Left lower extremity wound well-healed. No drainage. There is some erythema at the left shin into the left foot. There is also an ulcer with an eschar on the left heel.   Impression and Plan: The patient's bilateral lower extremity wounds are now well-healed. There is decreasing erythema of the left lower extremity. I have given her 4 more days of dicloxacillin. She was instructed if she develops increased tenderness, erythema, fever, chills she is to contact the office immediately. Otherwise, she'll be seen in followup in 2 weeks. She is diabetic and regarding her left heel ulcer, her medical doctor is following this. Finally, she states she is also being worked up for possible peripheral arterial disease.

## 2011-10-22 ENCOUNTER — Ambulatory Visit (INDEPENDENT_AMBULATORY_CARE_PROVIDER_SITE_OTHER): Payer: Medicare Other | Admitting: Ophthalmology

## 2011-10-22 DIAGNOSIS — E1139 Type 2 diabetes mellitus with other diabetic ophthalmic complication: Secondary | ICD-10-CM

## 2011-10-22 DIAGNOSIS — E11319 Type 2 diabetes mellitus with unspecified diabetic retinopathy without macular edema: Secondary | ICD-10-CM

## 2011-10-22 DIAGNOSIS — E1165 Type 2 diabetes mellitus with hyperglycemia: Secondary | ICD-10-CM

## 2011-10-22 DIAGNOSIS — H43819 Vitreous degeneration, unspecified eye: Secondary | ICD-10-CM

## 2011-10-24 ENCOUNTER — Other Ambulatory Visit (INDEPENDENT_AMBULATORY_CARE_PROVIDER_SITE_OTHER): Payer: Medicare Other | Admitting: Ophthalmology

## 2011-10-27 ENCOUNTER — Ambulatory Visit (INDEPENDENT_AMBULATORY_CARE_PROVIDER_SITE_OTHER): Payer: Medicare Other | Admitting: Physician Assistant

## 2011-10-27 ENCOUNTER — Other Ambulatory Visit: Payer: MEDICARE

## 2011-10-27 VITALS — BP 176/60 | HR 68 | Resp 20 | Ht 61.0 in

## 2011-10-27 DIAGNOSIS — L03119 Cellulitis of unspecified part of limb: Secondary | ICD-10-CM

## 2011-10-27 DIAGNOSIS — Z9889 Other specified postprocedural states: Secondary | ICD-10-CM

## 2011-10-27 DIAGNOSIS — L02419 Cutaneous abscess of limb, unspecified: Secondary | ICD-10-CM

## 2011-10-27 DIAGNOSIS — I2510 Atherosclerotic heart disease of native coronary artery without angina pectoris: Secondary | ICD-10-CM

## 2011-10-27 NOTE — Progress Notes (Signed)
[address]            [place] [address]          [phone]     HPI: Patient returns for recheck of left lower extremity cellulitis. She is status post CABG from August 2012 and has been followed with nonhealing EVH site which required incision and drainage in the office and wet to dry dressing changes. She recently has completed 2 rounds of dicloxacillin for cellulitis. At this point she has completed all antibiotics. She states that the redness in her leg has significantly improved. She is no longer doing the dressing changes. There is no pain in the lower extremity although she does report swelling if she is on her feet a lot, worsening throughout the day improving with rest and elevation.   Current Outpatient Prescriptions  Medication Sig Dispense Refill  . amiodarone (PACERONE) 200 MG tablet Take 200 mg by mouth 3 (three) times a week.       [name] ascorbic acid (VITAMIN C) 1000 MG tablet Take 1,000 mg by mouth 1 day or 1 dose. 3 times a week       . aspirin 325 MG tablet Take 81 mg by mouth daily.       . bimatoprost (LUMIGAN) 0.03 % ophthalmic solution Place 1 drop into both eyes at bedtime.        . calcium-vitamin D (OSCAL WITH D) 500-200 MG-UNIT per tablet Take 1 tablet by mouth 1 day or 1 dose. On Thursday and Sundays       . cholecalciferol (VITAMIN D) 1000 UNITS tablet Take 1,000 Units by mouth daily.        . cholecalciferol (VITAMIN D) 400 UNITS TABS Take by mouth.        . ergocalciferol (VITAMIN D2) 50000 UNITS capsule Take 50,000 Units by mouth once a week. Takes Thursday and Sunday        . felodipine (PLENDIL) 5 MG 24 hr tablet Take 5 mg by mouth daily.        [name] FLUoxetine (PROZAC) 20 MG capsule Take 20 mg by mouth daily.        [name] glipiZIDE (GLUCOTROL) 10 MG tablet Take 10 mg by mouth 2 (two) times daily before a meal.        . hydrochlorothiazide 25 MG tablet Take 25 mg by mouth daily.        . insulin glargine (LANTUS) 100 UNIT/ML  injection Inject 10 Units into the skin at bedtime.        [name] levothyroxine (SYNTHROID, LEVOTHROID) 150 MCG tablet Take 125 mcg by mouth daily.       . metoprolol tartrate (LOPRESSOR) 25 MG tablet Take 25 mg by mouth 2 (two) times daily.        [name] omeprazole (PRILOSEC) 20 MG capsule Take 20 mg by mouth daily.        . pioglitazone (ACTOS) 30 MG tablet Take 30 mg by mouth daily.        . QUEtiapine (SEROQUEL) 25 MG tablet Take 25 mg by mouth at bedtime.        . rosuvastatin (CRESTOR) 10 MG tablet Take 10 mg by mouth daily.        . traMADol (ULTRAM) 50 MG tablet Take 50 mg by mouth every 6 (six) hours as needed.        [name]  metFORMIN (GLUCOPHAGE) 500 MG tablet Take 500 mg by mouth 2 (two) times daily with a meal.          Physical Exam: BP 176/60 HR 68 Resp 20 Wounds: sternal wound well healed.  LLE EVH site healed, no surrounding erythema or drainage.  LLE cellulitis resolved. Heart: RRR Lungs: clear Extremities: + LLE dependent edema    Assessment/Plan: Resolving cellulitis, left lower extremity. She continues to have some left lower extremity dependent edema, but she states that this has continued to improve overall and is much better but she keeps her leg elevated. Her weight has been stable. I don't feel that she needs further diuresis at this point. She has completed her course of antibiotics. We will plan to see her back in 3-4 weeks for recheck.

## 2011-11-05 ENCOUNTER — Other Ambulatory Visit (INDEPENDENT_AMBULATORY_CARE_PROVIDER_SITE_OTHER): Payer: Medicare Other | Admitting: Ophthalmology

## 2011-11-05 DIAGNOSIS — E11359 Type 2 diabetes mellitus with proliferative diabetic retinopathy without macular edema: Secondary | ICD-10-CM

## 2011-11-05 DIAGNOSIS — E1139 Type 2 diabetes mellitus with other diabetic ophthalmic complication: Secondary | ICD-10-CM

## 2011-11-18 ENCOUNTER — Encounter: Payer: Medicare Other | Admitting: Thoracic Surgery (Cardiothoracic Vascular Surgery)

## 2011-11-21 ENCOUNTER — Ambulatory Visit (INDEPENDENT_AMBULATORY_CARE_PROVIDER_SITE_OTHER): Payer: Medicare Other | Admitting: Thoracic Surgery (Cardiothoracic Vascular Surgery)

## 2011-11-21 ENCOUNTER — Encounter: Payer: Self-pay | Admitting: Thoracic Surgery (Cardiothoracic Vascular Surgery)

## 2011-11-21 VITALS — BP 161/64 | HR 60 | Resp 16 | Ht 60.0 in | Wt 174.0 lb

## 2011-11-21 DIAGNOSIS — Z09 Encounter for follow-up examination after completed treatment for conditions other than malignant neoplasm: Secondary | ICD-10-CM

## 2011-11-21 DIAGNOSIS — I2510 Atherosclerotic heart disease of native coronary artery without angina pectoris: Secondary | ICD-10-CM

## 2011-11-21 NOTE — Progress Notes (Signed)
Patient ID: [name] [name], female   DOB: June 06, 1934, 78 y.o.   MRN: [id number]   [name] returns today for followup of her left leg wound. She had coronary bypass grafting in August of last year. A couple of months postop she developed a wound infection in her left leg. This was incredibly slow to heal. She was treated with local wound care and multiple courses of antibiotics. Eventually Dr. Earl Gala recognize that likely had a vascular etiology and it turned out that her ABIs were markedly diminished on the left side. Being 0.44 in the anterior tibial and 0.38 in the peroneal.  She states that since her last visit the wound is healed. There is no drainage or redness. However she is having some significant pain in the heel and also having some pain in her instep includes lies down at night. Dr. Earl Gala a scheduled her to see Dr. Eldridge Dace to do an angiogram.  BP 161/64  Pulse 60  Resp 16  Ht 5' (1.524 m)  Wt 174 lb (78.926 kg)  BMI 33.98 kg/m2  SpO2 96% Left leg wounds healed No edema There is some hyperemia and delayed capillary refill in the left lower leg and foot  My concern at the present time is that she may be having rest ischemic pain in the foot she's not scheduled to see Dr. Eldridge Dace until May. I'll have our office see if we can get an appointment moved up as soon as possible. I encouraged her to go ahead with an angiogram as soon as it could be done.  I'll be happy to see her back any time that I can be of any further assistance with her care.

## 2011-12-02 ENCOUNTER — Encounter (HOSPITAL_COMMUNITY): Payer: Self-pay | Admitting: Pharmacy Technician

## 2011-12-04 SURGERY — ANGIOGRAM, LOWER EXTREMITY
Anesthesia: LOCAL

## 2012-01-13 ENCOUNTER — Encounter (HOSPITAL_BASED_OUTPATIENT_CLINIC_OR_DEPARTMENT_OTHER): Payer: Medicare Other | Attending: General Surgery

## 2012-01-13 DIAGNOSIS — Z7982 Long term (current) use of aspirin: Secondary | ICD-10-CM | POA: Insufficient documentation

## 2012-01-13 DIAGNOSIS — E1169 Type 2 diabetes mellitus with other specified complication: Secondary | ICD-10-CM | POA: Insufficient documentation

## 2012-01-13 DIAGNOSIS — Z79899 Other long term (current) drug therapy: Secondary | ICD-10-CM | POA: Insufficient documentation

## 2012-01-13 DIAGNOSIS — I739 Peripheral vascular disease, unspecified: Secondary | ICD-10-CM | POA: Insufficient documentation

## 2012-01-13 DIAGNOSIS — L97509 Non-pressure chronic ulcer of other part of unspecified foot with unspecified severity: Secondary | ICD-10-CM | POA: Insufficient documentation

## 2012-01-13 DIAGNOSIS — I2510 Atherosclerotic heart disease of native coronary artery without angina pectoris: Secondary | ICD-10-CM | POA: Insufficient documentation

## 2012-01-13 DIAGNOSIS — Z951 Presence of aortocoronary bypass graft: Secondary | ICD-10-CM | POA: Insufficient documentation

## 2012-01-13 DIAGNOSIS — E039 Hypothyroidism, unspecified: Secondary | ICD-10-CM | POA: Insufficient documentation

## 2012-01-13 DIAGNOSIS — I10 Essential (primary) hypertension: Secondary | ICD-10-CM | POA: Insufficient documentation

## 2012-01-13 NOTE — Progress Notes (Signed)
[facility]  NAME:  [name], [name] NO.:  [id number]  MEDICAL RECORD NO.:  [id number]      DATE OF BIRTH:  01-11-1934  PHYSICIAN:  Joanne Gavel, M.D.         VISIT DATE:  01/13/2012                                  OFFICE VISIT   CHIEF COMPLAINT:  Wound of left heel.  HISTORY OF PRESENT ILLNESS:  This 78 year old female underwent a coronary artery quadruple bypass last August.  She was in a rehab facility when she noted a wound of her left heel.  She thinks this may have been in October or November.  The wounds seem to have waxed and waned.  She has not applied any particular treatment.  PAST MEDICAL HISTORY: 1. Significant for coronary artery disease. 2. Peripheral vascular disease. 3. Anemia. 4. Diabetes mellitus. 5. Hypertension. 6. Coronary artery disease. 7. Hypothyroidism.  PAST SURGICAL HISTORY:  Gallbladder removal, hand surgery, quadruple bypass, hip replacement, and back surgery.  SOCIAL HISTORY:  Cigarettes none for 25 years.  Alcohol rarely.  MEDICATIONS:  Lantus insulin, vitamins, tramadol, pravastatin, metformin, glipizide, metoprolol, amlodipine, omeprazole, ferrous sulfate, fluoxetine.  ALLERGIES: 1. VICODIN. 2. AMITRIPTYLINE. 3. VESICARE.  REVIEW OF SYSTEMS:  The patient had vascular studies done in January 2013, which revealed an ABI of 0.7 on the left and signs of significant inflow blockage.  She was to have an angiogram but treatment was revealed by the onset of anemia.  PHYSICAL EXAMINATION:  VITAL SIGNS: Temperature 98, pulse 74, respirations 19, blood pressure 125/83.  Glucose is 139. GENERAL: The patient is awake and alert. EYES, EARS, NOSE, THROAT: Normal to cursory examination. CHEST: Clear. HEART: Regular rhythm. EXTREMITIES: Examination of the lower extremities reveals left dorsalis pedis and  posterior tibial pulse not palpated.  ABI is measured at approximately 1 rather surprised by this test  result.  There is a 1.6 x 0.8 wound on the lateral aspect of the left heel with a full-thickness eschar which I have excised sharply.  The base of the wound is very shaggy.  IMPRESSION:  Diabetic foot ulcer, which probably started out as a pressure wounds.  PLAN OF TREATMENT:  Santyl, Hydrogel.  I will see her in 7 days.  In the meantime, she needs completed vascular workup.     Joanne Gavel, M.D.     RA/MEDQ  D:  01/13/2012  T:  01/13/2012  Job:  [id number]  cc:   Corky Crafts, MD

## 2012-01-20 ENCOUNTER — Encounter (HOSPITAL_BASED_OUTPATIENT_CLINIC_OR_DEPARTMENT_OTHER): Payer: Medicare Other | Attending: General Surgery

## 2012-01-20 DIAGNOSIS — L89609 Pressure ulcer of unspecified heel, unspecified stage: Secondary | ICD-10-CM | POA: Insufficient documentation

## 2012-01-20 DIAGNOSIS — L8992 Pressure ulcer of unspecified site, stage 2: Secondary | ICD-10-CM | POA: Insufficient documentation

## 2012-01-20 DIAGNOSIS — E1169 Type 2 diabetes mellitus with other specified complication: Secondary | ICD-10-CM | POA: Insufficient documentation

## 2012-01-20 DIAGNOSIS — L97409 Non-pressure chronic ulcer of unspecified heel and midfoot with unspecified severity: Secondary | ICD-10-CM | POA: Insufficient documentation

## 2012-01-21 ENCOUNTER — Ambulatory Visit (HOSPITAL_COMMUNITY)
Admission: RE | Admit: 2012-01-21 | Payer: Medicare Other | Source: Ambulatory Visit | Admitting: Interventional Cardiology

## 2012-01-22 ENCOUNTER — Other Ambulatory Visit: Payer: Self-pay | Admitting: Interventional Cardiology

## 2012-02-05 ENCOUNTER — Ambulatory Visit (HOSPITAL_COMMUNITY): Admit: 2012-02-05 | Payer: Medicare Other | Admitting: Interventional Cardiology

## 2012-02-05 ENCOUNTER — Encounter (HOSPITAL_COMMUNITY): Payer: Self-pay

## 2012-02-05 SURGERY — ANGIOGRAM, LOWER EXTREMITY
Anesthesia: LOCAL

## 2012-02-10 ENCOUNTER — Encounter (HOSPITAL_BASED_OUTPATIENT_CLINIC_OR_DEPARTMENT_OTHER): Payer: BC Managed Care – PPO

## 2012-02-17 ENCOUNTER — Encounter (HOSPITAL_BASED_OUTPATIENT_CLINIC_OR_DEPARTMENT_OTHER): Payer: Medicare Other | Attending: General Surgery

## 2012-02-17 DIAGNOSIS — L97409 Non-pressure chronic ulcer of unspecified heel and midfoot with unspecified severity: Secondary | ICD-10-CM | POA: Insufficient documentation

## 2012-02-17 DIAGNOSIS — E1169 Type 2 diabetes mellitus with other specified complication: Secondary | ICD-10-CM | POA: Insufficient documentation

## 2012-03-03 ENCOUNTER — Encounter (HOSPITAL_COMMUNITY): Payer: Self-pay | Admitting: Pharmacy Technician

## 2012-03-04 ENCOUNTER — Ambulatory Visit (INDEPENDENT_AMBULATORY_CARE_PROVIDER_SITE_OTHER): Payer: Medicare Other | Admitting: Ophthalmology

## 2012-03-04 ENCOUNTER — Other Ambulatory Visit: Payer: Self-pay | Admitting: Interventional Cardiology

## 2012-03-04 ENCOUNTER — Other Ambulatory Visit: Payer: Self-pay | Admitting: Cardiology

## 2012-03-04 DIAGNOSIS — E1139 Type 2 diabetes mellitus with other diabetic ophthalmic complication: Secondary | ICD-10-CM

## 2012-03-04 DIAGNOSIS — E11319 Type 2 diabetes mellitus with unspecified diabetic retinopathy without macular edema: Secondary | ICD-10-CM

## 2012-03-04 DIAGNOSIS — E11359 Type 2 diabetes mellitus with proliferative diabetic retinopathy without macular edema: Secondary | ICD-10-CM

## 2012-03-04 DIAGNOSIS — H43819 Vitreous degeneration, unspecified eye: Secondary | ICD-10-CM

## 2012-03-04 NOTE — H&P (Signed)
PRE LE ANGIO WORK UP/JV/443.9/STAT LABS/SEE LINDA.      HPI:     General:           [name] [name] is a 7 female followed by Dr Eldridge Dace with hx of PVD, CAD, CABG 8/12 by Dr Dorris Fetch, and hypertension. She has an ulcer on left heel that is not healing and Dr Eldridge Dace would like to proceed with lower extremity angiogram and intervention if possible to increase blood flow. Foot discomfort started initially in September 2012 and paln for angiogram in March this year was delayed due to anemia. She has had abnormal ABIs and doppler studies with -- the left -- 0.44 of the anterior tibial and 0.38 in the peroneal. She has calf claudication and weakness in her calf as well as heel pain and pain in her instep at night. She also has pain in both legs L>R and lower back chronic pain. ( hx of 2 back surgeries 8 yrs ago on back) She has been going to [facility] center for foot but is not healing. .         Patient denies chest pain, palpitations, dizziness, syncope, nor PND.     ROS:      as noted in HPI, increase redness of left foot, has dsg in place, she also has pain in her left hip with protrusion of hip bone. no fever chills congestion, no GI complaints. + arthritis and muscular discomfort. no falls, using cane.     Medical History: diabetes mellitus with PVD and CKD, Hypertension, Hypothyroidism, Depression, Carotid artery disease, CKD stage III.      Surgical History: hemiarthroplasty of hip 11/2009, back surgery , CABG , colonoscopy .      Family History:  Father: deceased CVA Mother: deceased 71 yrs  no early CAD.     Social History:      General: History of smoking  cigarettes: Former smoker, Quit in year 1985, Pack-year Hx: 60.  no Smoking. no Alcohol. Caffeine: yes. no Recreational drug use.      Medications: Aspirin 325 MG Tablet Chewable 1 tablet Once a day, Levoxyl 125 mcg Tablet 1 tablet every morning on an empty stomach Once a day, Vitamin D [address] UNIT Capsule 1 capsule twice weekly, One touch  ultra test strips regular strips and lancets as directed as directed, Pen Needles 5/16" 31G X 8 MM Miscellaneous as directed , Quetiapine Fumarate 25 MG Tablet 1 tablet Once a day, Lumigan 0.03 % Solution 1 drop into affected eye every evening Once a day, Actos 30 MG Tablet TAKE 1 TABLET BY MOUTH EVERY DAY , Lantus SoloStar 100 UNIT/ML pen 10 u Once a day, Vitamin C 1000 MG Tablet 1 tablet three times weekly, Tramadol 50 mg tablet one tab every 6 hours prn pain, HydrOXYzine HCl 25 MG Tablet 1 tablet as needed, Pravastatin Sodium 40 MG Tablet 1 tablet Once a day, Metformin HCl 500 MG Tablet Extended Release 24 Hour take 1 tablet by mouth twice a day , GlipiZIDE XL 5 MG Tablet Extended Release 24 Hour 2 tablets twice a day, Metoprolol Tartrate 25 MG Tablet 1 tablet Twice a day, Felodipine 5 MG Tablet Extended Release 24 Hour take 1 tablet by mouth once daily , Omeprazole 20 MG Capsule Delayed Release take 1 capsule by mouth once daily , Ferrous Sulfate 325 (65 Fe) MG Tablet 1 tablet Once a day, Fluoxetine HCl 20 MG Capsule take 1 capsule by mouth once daily , Vitamin D (Ergocalciferol) 50000 UNIT  Capsule 1 capsule Once a Week, Medication List reviewed and reconciled with the patient     Allergies: Vicodin, VESIcare, Amitriptyline HCl, Iron: ? rash.      Objective:    Vitals: Wt 174, Wt change -2 lb, Ht 60.5, BMI 33.42, Pulse sitting 61, BP sitting 163/64.     Examination:     General Examination:         GENERAL APPEARANCE alert, oriented, NAD, pleasant.  SKIN: normal, no rash.  HEAD: [place]/AT.  NECK: supple, no JVD, , bilateral carotid bruits 1/6.  LUNGS: clear to auscultation bilaterally, no wheezes, rhonchi, rales.  HEART: regular rate and rhythm, normal S1S2, II/VI systolic murmur.  ABDOMEN: soft and not tender, non-distended,.  EXTREMITIES: no edema.  NEUROLOGIC EXAM: non-focal exam.  PERIPHERAL PULSES: diminished, symmetrical bilateral.  PSYCH appropriate mood and affect .       left heel ulcer with  dressing inplace, toes cool to touch with redness no cyanosis.      Assessment:    Assessment:  1. Peripheral Vascular Disease - 443.9 (Primary)   2. Essential hypertension, benign - 401.1   3. Coronary artery disease - 414.00     Plan:    1. Peripheral Vascular Disease        LAB: CBC with Diff     WBC 6.3 4.0-11.0 - K/ul         RBC 3.63 4.20-5.40 - M/uL L        HGB 11.1 12.0-16.0 - g/dL L        HCT 16.1 09.6-04.5 - % L       MCH 30.6 27.0-33.0 - pg        MPV 9.3 7.5-10.7 - fL        MCV 94.9 81.0-99.0 - fL        MCHC 32.3 32.0-36.0 - g/dL         RDW 40.9 81.1-91.4 - % H       NRBC# 0.00 -        PLT 200 150-400 - K/uL        NEUT % 55.1 43.3-71.9 - %        NRBC% 0.10 - %        LYMPH% 28.4 16.8-43.5 - %        MONO % 11.1 4.6-12.4 - %        EOS % 4.6 0.0-7.8 - %        BASO % 0.8 0.0-1.0 - %        NEUT # 3.5 1.9-7.2 - K/uL        LYMPH# 1.80 1.10-2.70 - K/uL        MONO # 0.7 0.3-0.8 - K/uL        EOS # 0.3 0.0-0.6 - K/uL        BASO # 0.1 0.0-0.1 - K/uL               Jibran Crookshanks A 03/03/2012 02:05:53 PM > much improved anemia, please update pt, for LE angiogram Harward,Amy 03/03/2012 02:14:36 PM > pt notified.        LAB: Basic Metabolic      GLUCOSE 123 70-99 - mg/dL H        BUN 27 7-82 - mg/dL H       CREATININE 9.56 0.60-1.30 - mg/dl         eGFR (NON-AFRICAN AMERICAN) 46 >60 - calc L        eGFR (AFRICAN AMERICAN)  56 >60 - calc L       SODIUM 138 136-145 - mmol/L        POTASSIUM 4.6 3.5-5.5 - mmol/L        CHLORIDE 103 98-107 - mmol/L        C02 30 22-32 - mg/dL        ANION GAP 8.8 1.6-10.9 - mmol/L        CALCIUM 9.8 8.6-10.3 - mg/dL               Nateisha Moyd A 03/03/2012 02:06:42 PM > ok for angiogram Harward,Amy 03/03/2012 02:14:28 PM > pt notified.        LAB: PT and PTT ([id number])     aPTT 29 24-33 - SEC        INR 1.1 0.8-1.2 -        Prothrombin Time 11.5 9.1-12.0 - SEC               Neyla Gauntt A 03/04/2012 08:30:06 AM > ok    written instructions also given by Lorenda Cahill.Marland Kitchenpt aware to hold diabetic medications the day of procedure , , The patient was counseled regarding lower extremity angiogram Risks including but not limited to bleeding, stroke, death, heart attack, allergy, renal failure, arterial insufficiency, and vessel perforation were discussed in detail. The patient accepted the procedure and is willing to proceed.       2. Essential hypertension, benign  Continue Metoprolol Tartrate Tablet, 25 MG, 1 tablet, Orally, Twice a day ;  Continue Felodipine Tablet Extended Release 24 Hour, 5 MG, take 1 tablet by mouth once daily .       3. Coronary artery disease  Continue Aspirin Tablet Chewable, 325 MG, 1 tablet, Orally, Once a day .          Immunizations:       Labs:      Procedure Codes: 98119 ECL CBC PLATELET DIFF, 80048 ECL BMP, 14782 BLOOD COLLECTION ROUTINE VENIPUNCTURE     Preventive:           Follow Up: JV pending procedure (Reason: Perpherial vascular disease)        Provider: Michaell Cowing. Emelda Fear, NP  Patient: [name] [name], [name] [name]  DOB: 1933/11/17  Date: 03/03/2012

## 2012-03-10 ENCOUNTER — Ambulatory Visit (HOSPITAL_COMMUNITY)
Admission: RE | Admit: 2012-03-10 | Discharge: 2012-03-11 | Disposition: A | Discharge status: Home / Self Care | Payer: Medicare Other | Source: Ambulatory Visit | Attending: Interventional Cardiology | Admitting: Interventional Cardiology

## 2012-03-10 ENCOUNTER — Encounter (HOSPITAL_COMMUNITY)
Admission: RE | Disposition: A | Discharge status: Home / Self Care | Payer: Self-pay | Source: Ambulatory Visit | Attending: Interventional Cardiology

## 2012-03-10 ENCOUNTER — Encounter (HOSPITAL_COMMUNITY): Payer: Self-pay | Admitting: *Deleted

## 2012-03-10 DIAGNOSIS — I6509 Occlusion and stenosis of unspecified vertebral artery: Secondary | ICD-10-CM | POA: Insufficient documentation

## 2012-03-10 DIAGNOSIS — E119 Type 2 diabetes mellitus without complications: Secondary | ICD-10-CM | POA: Insufficient documentation

## 2012-03-10 DIAGNOSIS — Z794 Long term (current) use of insulin: Secondary | ICD-10-CM | POA: Insufficient documentation

## 2012-03-10 DIAGNOSIS — L98499 Non-pressure chronic ulcer of skin of other sites with unspecified severity: Secondary | ICD-10-CM | POA: Insufficient documentation

## 2012-03-10 DIAGNOSIS — I2510 Atherosclerotic heart disease of native coronary artery without angina pectoris: Secondary | ICD-10-CM | POA: Insufficient documentation

## 2012-03-10 DIAGNOSIS — I4891 Unspecified atrial fibrillation: Secondary | ICD-10-CM | POA: Insufficient documentation

## 2012-03-10 DIAGNOSIS — I10 Essential (primary) hypertension: Secondary | ICD-10-CM | POA: Insufficient documentation

## 2012-03-10 DIAGNOSIS — F0390 Unspecified dementia without behavioral disturbance: Secondary | ICD-10-CM | POA: Insufficient documentation

## 2012-03-10 DIAGNOSIS — L97409 Non-pressure chronic ulcer of unspecified heel and midfoot with unspecified severity: Secondary | ICD-10-CM | POA: Insufficient documentation

## 2012-03-10 DIAGNOSIS — E669 Obesity, unspecified: Secondary | ICD-10-CM | POA: Insufficient documentation

## 2012-03-10 DIAGNOSIS — I739 Peripheral vascular disease, unspecified: Secondary | ICD-10-CM | POA: Insufficient documentation

## 2012-03-10 HISTORY — DX: Type 2 diabetes mellitus without complications: E11.9

## 2012-03-10 HISTORY — DX: Anemia, unspecified: D64.9

## 2012-03-10 HISTORY — DX: Cerebral infarction, unspecified: I63.9

## 2012-03-10 HISTORY — DX: Low back pain: M54.5

## 2012-03-10 HISTORY — DX: Hypothyroidism, unspecified: E03.9

## 2012-03-10 HISTORY — DX: Other chronic pain: G89.29

## 2012-03-10 HISTORY — DX: Low back pain, unspecified: M54.50

## 2012-03-10 HISTORY — PX: PTCA: SHX146

## 2012-03-10 HISTORY — PX: LOWER EXTREMITY ANGIOGRAM: SHX5508

## 2012-03-10 LAB — GLUCOSE, CAPILLARY
Glucose-Capillary: 66 mg/dL — ABNORMAL LOW (ref 70–99)
Glucose-Capillary: 114 mg/dL — ABNORMAL HIGH (ref 70–99)
Glucose-Capillary: 130 mg/dL — ABNORMAL HIGH (ref 70–99)
Glucose-Capillary: 189 mg/dL — ABNORMAL HIGH (ref 70–99)

## 2012-03-10 LAB — POCT ACTIVATED CLOTTING TIME
Activated Clotting Time: 194 seconds
Activated Clotting Time: 224 seconds

## 2012-03-10 SURGERY — ANGIOGRAM, LOWER EXTREMITY
Anesthesia: LOCAL

## 2012-03-10 MED ORDER — ONDANSETRON HCL 4 MG/2ML IJ SOLN
4.0000 mg | Freq: Four times a day (QID) | INTRAMUSCULAR | Status: DC | PRN
  Filled 2012-03-10: qty 2

## 2012-03-10 MED ORDER — MORPHINE SULFATE 4 MG/ML IJ SOLN: 1.0000 mg | INTRAMUSCULAR | Status: DC | PRN

## 2012-03-10 MED ORDER — FELODIPINE ER 5 MG PO TB24
5.0000 mg | ORAL_TABLET | Freq: Every day | ORAL | Status: DC
  Administered 2012-03-10: 5 mg via ORAL
  Filled 2012-03-10 (×2): qty 1

## 2012-03-10 MED ORDER — GLIPIZIDE ER 5 MG PO TB24
5.0000 mg | ORAL_TABLET | Freq: Two times a day (BID) | ORAL | Status: DC
  Administered 2012-03-10 – 2012-03-11 (×2): 5 mg via ORAL
  Filled 2012-03-10 (×4): qty 1

## 2012-03-10 MED ORDER — SODIUM CHLORIDE 0.9 % IJ SOLN: 3.0000 mL | INTRAMUSCULAR | Status: DC | PRN

## 2012-03-10 MED ORDER — QUETIAPINE FUMARATE 25 MG PO TABS
25.0000 mg | ORAL_TABLET | Freq: Every day | ORAL | Status: DC
  Administered 2012-03-10: 25 mg via ORAL
  Filled 2012-03-10 (×2): qty 1

## 2012-03-10 MED ORDER — CLOPIDOGREL BISULFATE 75 MG PO TABS: 75.0000 mg | ORAL_TABLET | Freq: Every day | ORAL | Status: DC

## 2012-03-10 MED ORDER — ASPIRIN 81 MG PO CHEW
CHEWABLE_TABLET | ORAL | Status: AC
  Administered 2012-03-10: 324 mg via ORAL
  Filled 2012-03-10: qty 4

## 2012-03-10 MED ORDER — ASPIRIN 81 MG PO CHEW: 81.0000 mg | CHEWABLE_TABLET | Freq: Every day | ORAL | Status: DC

## 2012-03-10 MED ORDER — METFORMIN HCL 500 MG PO TABS: 500.0000 mg | ORAL_TABLET | Freq: Two times a day (BID) | ORAL | Status: DC

## 2012-03-10 MED ORDER — INSULIN ASPART 100 UNIT/ML ~~LOC~~ SOLN
0.0000 [IU] | SUBCUTANEOUS | Status: DC
  Administered 2012-03-10: 3 [IU] via SUBCUTANEOUS
  Administered 2012-03-11: 2 [IU] via SUBCUTANEOUS

## 2012-03-10 MED ORDER — FENTANYL CITRATE 0.05 MG/ML IJ SOLN
INTRAMUSCULAR | Status: AC
  Filled 2012-03-10: qty 2

## 2012-03-10 MED ORDER — SODIUM CHLORIDE 0.9 % IV SOLN: INTRAVENOUS | Status: DC

## 2012-03-10 MED ORDER — TRAMADOL HCL 50 MG PO TABS
50.0000 mg | ORAL_TABLET | Freq: Four times a day (QID) | ORAL | Status: DC
  Administered 2012-03-10 – 2012-03-11 (×3): 50 mg via ORAL
  Filled 2012-03-10 (×7): qty 1

## 2012-03-10 MED ORDER — PIOGLITAZONE HCL 30 MG PO TABS
30.0000 mg | ORAL_TABLET | Freq: Every day | ORAL | Status: DC
  Filled 2012-03-10 (×2): qty 1

## 2012-03-10 MED ORDER — SODIUM CHLORIDE 0.9 % IV SOLN: 250.0000 mL | INTRAVENOUS | Status: DC | PRN

## 2012-03-10 MED ORDER — DIAZEPAM 5 MG PO TABS: 5.0000 mg | ORAL_TABLET | ORAL | Status: DC

## 2012-03-10 MED ORDER — ASPIRIN 81 MG PO CHEW: 324.0000 mg | CHEWABLE_TABLET | ORAL | Status: DC

## 2012-03-10 MED ORDER — DIAZEPAM 5 MG PO TABS
ORAL_TABLET | ORAL | Status: AC
  Administered 2012-03-10: 5 mg via ORAL
  Filled 2012-03-10: qty 1

## 2012-03-10 MED ORDER — DEXTROSE 50 % IV SOLN
INTRAVENOUS | Status: AC
  Administered 2012-03-10: 25 mL via INTRAVENOUS
  Filled 2012-03-10: qty 50

## 2012-03-10 MED ORDER — HYDRALAZINE HCL 20 MG/ML IJ SOLN
INTRAMUSCULAR | Status: AC
  Filled 2012-03-10: qty 1

## 2012-03-10 MED ORDER — HEPARIN (PORCINE) IN NACL 2-0.9 UNIT/ML-% IJ SOLN
INTRAMUSCULAR | Status: AC
  Filled 2012-03-10: qty 1000

## 2012-03-10 MED ORDER — SODIUM CHLORIDE 0.9 % IJ SOLN: 3.0000 mL | Freq: Two times a day (BID) | INTRAMUSCULAR | Status: DC

## 2012-03-10 MED ORDER — DIAZEPAM 5 MG PO TABS
5.0000 mg | ORAL_TABLET | ORAL | Status: AC
  Administered 2012-03-10: 5 mg via ORAL

## 2012-03-10 MED ORDER — LEVOTHYROXINE SODIUM 125 MCG PO TABS
125.0000 ug | ORAL_TABLET | Freq: Every day | ORAL | Status: DC
  Filled 2012-03-10 (×2): qty 1

## 2012-03-10 MED ORDER — ONDANSETRON HCL 4 MG/2ML IJ SOLN
INTRAMUSCULAR | Status: AC
  Filled 2012-03-10: qty 2

## 2012-03-10 MED ORDER — CLOPIDOGREL BISULFATE 300 MG PO TABS
ORAL_TABLET | ORAL | Status: AC
  Filled 2012-03-10: qty 2

## 2012-03-10 MED ORDER — SODIUM CHLORIDE 0.9 % IV SOLN: 1.0000 mL/kg/h | INTRAVENOUS | Status: DC

## 2012-03-10 MED ORDER — INSULIN GLARGINE 100 UNIT/ML ~~LOC~~ SOLN
10.0000 [IU] | Freq: Every day | SUBCUTANEOUS | Status: DC
  Administered 2012-03-10: 10 [IU] via SUBCUTANEOUS

## 2012-03-10 MED ORDER — LIDOCAINE HCL (PF) 1 % IJ SOLN
INTRAMUSCULAR | Status: AC
  Filled 2012-03-10: qty 30

## 2012-03-10 MED ORDER — FLUOXETINE HCL 20 MG PO CAPS
20.0000 mg | ORAL_CAPSULE | Freq: Every day | ORAL | Status: DC
  Administered 2012-03-10: 20 mg via ORAL
  Filled 2012-03-10 (×2): qty 1

## 2012-03-10 MED ORDER — HEPARIN SODIUM (PORCINE) 1000 UNIT/ML IJ SOLN
INTRAMUSCULAR | Status: AC
  Filled 2012-03-10: qty 1

## 2012-03-10 MED ORDER — MIDAZOLAM HCL 2 MG/2ML IJ SOLN
INTRAMUSCULAR | Status: AC
  Filled 2012-03-10: qty 2

## 2012-03-10 MED ORDER — HYDRALAZINE HCL 20 MG/ML IJ SOLN
10.0000 mg | INTRAMUSCULAR | Status: DC | PRN
  Administered 2012-03-10: 10 mg via INTRAVENOUS
  Filled 2012-03-10: qty 1

## 2012-03-10 MED ORDER — SODIUM CHLORIDE 0.9 % IV SOLN
INTRAVENOUS | Status: DC
  Administered 2012-03-10: 08:00:00 via INTRAVENOUS

## 2012-03-10 MED ORDER — BIMATOPROST 0.03 % OP SOLN
1.0000 [drp] | Freq: Every day | OPHTHALMIC | Status: DC
  Filled 2012-03-10: qty 2.5

## 2012-03-10 MED ORDER — ACETAMINOPHEN 325 MG PO TABS: 650.0000 mg | ORAL_TABLET | ORAL | Status: DC | PRN

## 2012-03-10 MED ORDER — VITAMIN D3 25 MCG (1000 UNIT) PO TABS
1000.0000 [IU] | ORAL_TABLET | ORAL | Status: DC
  Administered 2012-03-11: 1000 [IU] via ORAL
  Filled 2012-03-10: qty 1

## 2012-03-10 MED ORDER — DEXTROSE 50 % IV SOLN
25.0000 mL | Freq: Once | INTRAVENOUS | Status: AC | PRN
  Administered 2012-03-10: 25 mL via INTRAVENOUS

## 2012-03-10 MED ORDER — SODIUM CHLORIDE 0.9 % IJ SOLN
3.0000 mL | Freq: Two times a day (BID) | INTRAMUSCULAR | Status: DC
  Administered 2012-03-10: 3 mL via INTRAVENOUS

## 2012-03-10 MED ORDER — ASPIRIN 81 MG PO CHEW
324.0000 mg | CHEWABLE_TABLET | ORAL | Status: AC
  Administered 2012-03-10: 324 mg via ORAL

## 2012-03-10 MED ORDER — CLOPIDOGREL BISULFATE 75 MG PO TABS
75.0000 mg | ORAL_TABLET | Freq: Every day | ORAL | Status: DC
  Administered 2012-03-11: 75 mg via ORAL
  Filled 2012-03-10: qty 1

## 2012-03-10 MED ORDER — METOPROLOL TARTRATE 25 MG PO TABS
25.0000 mg | ORAL_TABLET | Freq: Two times a day (BID) | ORAL | Status: DC
  Administered 2012-03-10: 25 mg via ORAL
  Filled 2012-03-10 (×3): qty 1

## 2012-03-10 MED ORDER — PANTOPRAZOLE SODIUM 40 MG PO TBEC
40.0000 mg | DELAYED_RELEASE_TABLET | Freq: Every day | ORAL | Status: DC
  Administered 2012-03-10: 40 mg via ORAL

## 2012-03-10 MED ORDER — BIMATOPROST 0.01 % OP SOLN
1.0000 [drp] | Freq: Every day | OPHTHALMIC | Status: DC
  Administered 2012-03-10: 1 [drp] via OPHTHALMIC
  Filled 2012-03-10: qty 2.5

## 2012-03-10 MED ORDER — SIMVASTATIN 10 MG PO TABS
10.0000 mg | ORAL_TABLET | Freq: Every day | ORAL | Status: DC
  Administered 2012-03-10 – 2012-03-11 (×2): 10 mg via ORAL
  Filled 2012-03-10 (×2): qty 1

## 2012-03-10 NOTE — H&P (Signed)
  Date of Initial H&P: 03/03/12  History reviewed, patient examined, no change in status, stable for surgery.

## 2012-03-10 NOTE — CV Procedure (Signed)
PROCEDURE:  Abdomnainal aortogram, Pelvic angiogram, selective left lower extremity arteriogram, PTA left SFA, selective right lower extremity angiogram.   INDICATIONS:  Persistent ulcer on the left heel; documented peripheral vascular disease by ultrasound with severely reduced left ABI  The risks, benefits, and details of the procedure were explained to the patient.  The patient verbalized understanding and wanted to proceed.  Informed written consent was obtained.  PROCEDURE TECHNIQUE:  After Xylocaine anesthesia a 52F sheath was placed in the right femoral artery with a single anterior needle wall stick.  A pigtail catheter was advanced to the abdominal aorta.  A power injection of contrast was performed in the AP projection.  The catheter was withdrawn to the aortoiliac bifurcation and a power injection contrast was performed.  A crossover catheter was used to obtain access to the left iliac system.  A Glidewire was advanced into the left SFA.  An straight catheter was advanced and a Rosen wire was placed down into the proximal SFA.  A 6 French Ansell sheath was advanced to the left external iliac artery.  The intervention was then performed.  Please see below for details.  Heparin was used for anticoagulation.  An ACT was used to check that the heparin was therapeutic.  The Ansell sheath was changed out for a 6 [place] short sheath.  A selective right lower extremity angiogram was performed.  The sheath will be removed using manual compression.   CONTRAST:  Total of 140 cc.  COMPLICATIONS:  None.    HEMODYNAMICS:  Aortic pressure was 180/85;  ANGIOGRAPHIC DATA:  The abdominal aorta has mild atherosclerosis but no evidence of aneurysm.  There is dual arterial supply to bilateral kidneys.  There is no obvious renal artery stenosis.  The aortoiliac bifurcation is calcified.  There is some interference from hardware in her back from prior surgery.  There does not appear to be any hemodynamically  significant aortoiliac disease.  Bilateral internal iliac arteries were widely patent.  Bilateral external iliac arteries also appear widely patent.  Bilateral common femoral arteries appear patent.   Left leg: The profunda femoral artery is patent.  The proximal SFA appears to have mild atherosclerosis but is patent.  There are mild irregularities in the mid SFA.  In the distal SFA at the adductor canal, there appears to be a short segment of complete occlusion.  There is diffuse disease in the popliteal artery.  The anterior tibial artery has a severe 90% ostial stenosis.  The tibioperoneal trunk is patent.  Both posterior tibial and peroneal  branches appear occluded with collateral flow from the anterior tibial and from above.  Right leg: The on the femoral artery is widely patent.  The superficial femoral artery has mild ostial disease.  There are mild luminal irregularities in the mid vessel.  In the distal SFA, there is moderate atherosclerosis.  Just above the patient's near placement, there is evidence of significant hazy disease in the right popliteal.  Part of the popliteal is obscured by the near placement.  The anterior tibial origin is patent.  There is severe disease in the proximal portion, up to 80%.  There are several severe sequential stenoses which lead to occlusion of the mid anterior tibial artery.  The distal part of this vessel fills by collaterals..  The tibioperoneal trunk is patent with moderate atherosclerosis.  The proximal peroneal artery has severe atherosclerosis up to 80%.  The posterior tibial vessel appears occluded and fills by collaterals from above.  INTERVENTIONAL  NARRATIVE: A 6 French Ansell sheath was used to obtain access to the left external iliac.  We initially tried a versa core wire to cross the complete occlusion in the distal left SFA.  This was unsuccessful.  Along with the endhole catheter, a glide wire was advanced.  It did successfully cross the area of  occlusion.  The endhole catheter was advanced across the occlusion.  The wire was then changed out for a versa core wire which obtained access to the tibial vasculature.  The wire was then changed out for a [place] core 0.018 wire.  A 4.0 x 40 mm Sterling balloon was advanced to the area of disease.  2 inflations were performed in the distal SFA to 6 atmospheres.  There was significant improvement in the stenosis.  There is a residual 25% stenosis.  There appeared to be a small area of dissection.  Due to the diffuse nature of the disease in the popliteal, I wanted to avoid stenting as there was no clear normal area to and the stent.  The same balloon was placed again in the distal SFA and 2 prolonged inflations were performed to try and tack up the area of possible dissection.  After this, there is normal flow.  There is no vessel wall staining.  The patient tolerated the procedure well.  We decided to stop here since the goal of this procedure was to help her heal the left foot ulcer.  Intra-arterial nitroglycerin was administered in the distal vasculature prior to the angioplasty to help eliminate vasospasm.   IMPRESSIONS:  1.  Occluded distal left SFA.  Successful PTA to the distal Left SFA with a 4.0 x 40 mm Sterling balloon.  No stent placed due to diffuse disease in the popliteal artery.   2.   Patent aortoiliac system.  No aortic aneurysm.  Patent proximal to mid SFAs bilaterally. 3.   Essentially one-vessel runoff below the left knee.  Two-vessel runoff below the right knee.    RECOMMENDATION:  Continue wound care.  The patient will take Plavix for at least 30 days along with aspirin.  She now has a palpable left dorsalis pedis pulse.  Hopefully, she will be to heal the left foot ulcer.  The plan will be to discharge her later today.  I will followup with her in the office.

## 2012-03-10 NOTE — Discharge Instructions (Signed)

## 2012-03-11 ENCOUNTER — Encounter (HOSPITAL_COMMUNITY): Payer: Self-pay | Admitting: General Practice

## 2012-03-11 LAB — BASIC METABOLIC PANEL
CO2: 27 mEq/L (ref 19–32)
Calcium: 9.2 mg/dL (ref 8.4–10.5)
GFR calc non Af Amer: 43 mL/min — ABNORMAL LOW (ref >90)
Sodium: 140 mEq/L (ref 135–145)

## 2012-03-11 LAB — CBC
MCH: 30.3 pg (ref 26.0–34.0)
MCHC: 32.5 g/dL (ref 30.0–36.0)
Platelets: 212 10*3/uL (ref 150–400)
RBC: 3.5 MIL/uL — ABNORMAL LOW (ref 3.87–5.11)

## 2012-03-11 LAB — MRSA PCR SCREENING: MRSA by PCR: POSITIVE — AB

## 2012-03-11 LAB — GLUCOSE, CAPILLARY: Glucose-Capillary: 148 mg/dL — ABNORMAL HIGH (ref 70–99)

## 2012-03-11 MED ORDER — CLOPIDOGREL BISULFATE 75 MG PO TABS: 75.0000 mg | ORAL_TABLET | Freq: Every day | ORAL | Status: AC

## 2012-03-11 MED ORDER — ASPIRIN 81 MG PO CHEW: 81.0000 mg | CHEWABLE_TABLET | Freq: Every day | ORAL | Status: AC

## 2012-03-11 NOTE — Progress Notes (Signed)
CBG: 61  Treatment: 15 GM carbohydrate snack  Symptoms: None  Follow-up CBG: Time:05:36 CBG Result:98  Possible Reasons for Event: Inadequate meal intake  Comments/MD notified:as per protocol    [name] [name]

## 2012-03-11 NOTE — Plan of Care (Signed)
Problem: Consults Goal: Vascular Cath Int Patient Education (See Patient Education module for education specifics.)  Outcome: Completed/Met Date Met:  03/11/12 Reviewed information regarding procedure, results of procedure and need for plavix post procedure with patient and daughter with good verbalized understanding by both. Questions answered during  Discussion. Goal: Skin Care Protocol Initiated - if indicated If consults are not indicated, leave blank or document N/A  Outcome: Completed/Met Date Met:  03/11/12 Reviewed information and questions answered. Goal: Nutrition Consult-if indicated Outcome: Completed/Met Date Met:  03/11/12 Reviewed information and questions answered. Good verbalized understanding Goal: Diabetes Guidelines if Diabetic/Glucose > 140 If diabetic or lab glucose is > 140 mg/dl - Initiate Diabetes/Hyperglycemia Guidelines & Document Interventions  Outcome: Completed/Met Date Met:  03/11/12 Information reviewed with good understanding  Problem: Phase I Progression Outcomes Goal: Distal pulses equal to baseline Outcome: Completed/Met Date Met:  03/11/12 Right dorsalis pedis pulse weak but palpated. Left dorsalis pedis palpable. No change noted since exam yesterday  Goal: Vascular site scale level 0 - I Vascular Site Scale Level 0: No bruising/bleeding/hematoma Level I (Mild): Bruising/Ecchymosis, minimal bleeding/ooozing, palpable hematoma < 3 cm Level II (Moderate): Bleeding not affecting hemodynamic parameters, pseudoaneurysm, palpable hematoma > 3 cm  Outcome: Completed/Met Date Met:  03/11/12 Level 0 no swelling bruising or drainage at site. Bruising noted lateral to site unchanged since exam yesterday Goal: Pain controlled with appropriate interventions Outcome: Completed/Met Date Met:  03/11/12 Pain well controlled with a small amount this am intermittently, increased with ambulation none at rest. Patient refused tramadol offered this am.  Goal:  Voiding-avoid urinary catheter unless indicated Outcome: Completed/Met Date Met:  03/11/12 Voiding in good amounts, incontinent at times unchanged from admission  Problem: Phase II Progression Outcomes Goal: Discharge plan in place and appropriate Outcome: Completed/Met Date Met:  03/11/12 Patient being discharged to home today Goal: Pain controlled with appropriate interventions Outcome: Completed/Met Date Met:  03/11/12 Pain well controlled Goal: Ambulates up to 600 ft. in hall x 1 Outcome: Not Applicable Date Met:  03/11/12 Patient ambulating in room. Steady gait with use of cane. Patient stated this was her baseline prior to admission. She became weak when diagnosed with anemia and shes feeling stronger than when she was admitted. Goal: Tolerates diet Outcome: Completed/Met Date Met:  03/11/12 Patient ate 100 % breakfast this am, appetite good Goal: Activity appropriate for discharge plan Outcome: Completed/Met Date Met:  03/11/12 Activity at baseline.

## 2012-03-11 NOTE — Care Management Note (Signed)
    Page 1 of 1   03/11/2012     1:29:07 PM   CARE MANAGEMENT NOTE 03/11/2012  Patient:  [name] [name], [name] [name]   Account Number:  [id number]  Date Initiated:  03/11/2012  Documentation initiated by:  Paeton Studer  Subjective/Objective Assessment:   PT S/P ABDOMINAL AORTOGRAM, LE ARTERIOGRAM ON 03/10/12. PTA, PT LIVES ALONE AND IS INDEPENDENT.  HAS SOME ASSIST FROM FAMILY.     Action/Plan:   NO HOME HEALTH NEEDED UPON DC.   Anticipated DC Date:  03/11/2012   Anticipated DC Plan:  HOME/SELF CARE      DC Planning Services  CM consult      Choice offered to / List presented to:             Status of service:  Completed, signed off Medicare Important Message given?   (If response is "NO", the following Medicare IM given date fields will be blank) Date Medicare IM given:   Date Additional Medicare IM given:    Discharge Disposition:  HOME/SELF CARE  Per UR Regulation:    If discussed at Long Length of Stay Meetings, dates discussed:    Comments:

## 2012-03-11 NOTE — Discharge Summary (Addendum)
Patient ID: [name] [name] MRN: [id number] DOB/AGE: 1933/12/02 78 y.o.  Admit date: 03/10/2012 Discharge date: 03/11/2012   Primary Discharge Diagnosis: Peripheral vascular disease Secondary Discharge Diagnosis: Hypertension Thyroid disease Insulin dependent diabetes CAD Bilateral carotid disease Obesity Atrial fibrillation Dementia  Significant Diagnostic Studies: Peripheral vacular angiography and PTA of distal Left SFA for non healing ulcer.  Consults: None  Hospital Course: Underwent procedure above. No overnight complications. Intact pulse noted in DP on left.   Discharge Exam: Blood pressure 137/58, pulse 71, temperature 97.8 F (36.6 C), temperature source Oral, resp. rate 20, height 5' (1.524 m), weight 79.1 kg (174 lb 6.1 oz), SpO2 98.00%.    Intact faint left PT pulse. Labs:   Lab Results  Component Value Date   WBC 8.5 03/11/2012   HGB 10.6* 03/11/2012   HCT 32.6* 03/11/2012   MCV 93.1 03/11/2012   PLT 212 03/11/2012     Lab 03/11/12 0500  NA 140  K 4.0  CL 105  CO2 27  BUN 24*  CREATININE 1.19*  CALCIUM 9.2  PROT --  BILITOT --  ALKPHOS --  ALT --  AST --  GLUCOSE 60*    Radiology:N/A  EKG:N/A  FOLLOW UP PLANS AND APPOINTMENTS Discharge Orders    Future Appointments: Provider: Department: Dept Phone: Center:   03/16/2012 8:15 AM [facility] [phone] [facility]   09/03/2012 1:00 PM Sherrie George, MD [facility] [phone] None     Medication List  As of 03/11/2012 11:37 AM   TAKE these medications         ascorbic acid 1000 MG tablet   Commonly known as: VITAMIN C   Take 1,000 mg by mouth every Monday, Wednesday, and Friday.      aspirin 81 MG chewable tablet   Chew 1 tablet (81 mg total) by mouth daily.      bimatoprost 0.03 % ophthalmic solution   Commonly known as: LUMIGAN   Place 1 drop into both eyes at bedtime.      calcium-vitamin D 500-200 MG-UNIT per tablet   Commonly known as:  OSCAL WITH D   Take 1 tablet by mouth 2 (two) times a week. On Thursday and Sundays      cholecalciferol 1000 UNITS tablet   Commonly known as: VITAMIN D   Take 1,000 Units by mouth See admin instructions. On Monday, Tuesday, Wednesday, Friday, Saturday.      clopidogrel 75 MG tablet   Commonly known as: PLAVIX   Take 1 tablet (75 mg total) by mouth daily.      ergocalciferol 50000 UNITS capsule   Commonly known as: VITAMIN D2   Take 50,000 Units by mouth 2 (two) times a week. On Thursday and Sunday      felodipine 5 MG 24 hr tablet   Commonly known as: PLENDIL   Take 5 mg by mouth daily.      FLUoxetine 20 MG capsule   Commonly known as: PROZAC   Take 20 mg by mouth daily.      glipiZIDE 5 MG 24 hr tablet   Commonly known as: GLUCOTROL XL   Take 5 mg by mouth 2 (two) times daily.      insulin glargine 100 UNIT/ML injection   Commonly known as: LANTUS   Inject 10 Units into the skin at bedtime.      levothyroxine 125 MCG tablet   Commonly known as: SYNTHROID, LEVOTHROID   Take 125 mcg by  mouth daily.      metFORMIN 500 MG tablet   Commonly known as: GLUCOPHAGE   Take 1 tablet (500 mg total) by mouth 2 (two) times daily with a meal.   Start taking on: 03/12/2012      metoprolol tartrate 25 MG tablet   Commonly known as: LOPRESSOR   Take 25 mg by mouth 2 (two) times daily.      omeprazole 20 MG capsule   Commonly known as: PRILOSEC   Take 20 mg by mouth daily.      pioglitazone 30 MG tablet   Commonly known as: ACTOS   Take 30 mg by mouth daily.      pravastatin 40 MG tablet   Commonly known as: PRAVACHOL   Take 40 mg by mouth daily.      QUEtiapine 25 MG tablet   Commonly known as: SEROQUEL   Take 25 mg by mouth at bedtime.      traMADol 50 MG tablet   Commonly known as: ULTRAM   Take 50 mg by mouth daily as needed. For pain           Follow-up Information    Follow up with Corky Crafts., MD in 2 weeks. (Please call to arrange)    Contact  information:   [address] [place] [address] [phone]          BRING ALL MEDICATIONS WITH YOU TO FOLLOW UP APPOINTMENTS  Time spent with patient to include physician time: 15 min. Signed: Lesleigh Noe 03/11/2012, 11:37 AM

## 2012-03-11 NOTE — Progress Notes (Signed)
CBG: 59  Treatment: 15 GM carbohydrate snack  Symptoms: None  Follow-up CBG: Time:01:32 CBG Result:111  Possible Reasons for Event: Inadequate meal intake  Comments/MD notified:as per protocol    [name] [name]

## 2012-03-16 ENCOUNTER — Encounter (HOSPITAL_BASED_OUTPATIENT_CLINIC_OR_DEPARTMENT_OTHER): Payer: Medicare Other | Attending: General Surgery

## 2012-03-16 DIAGNOSIS — E1169 Type 2 diabetes mellitus with other specified complication: Secondary | ICD-10-CM | POA: Insufficient documentation

## 2012-03-16 DIAGNOSIS — L97409 Non-pressure chronic ulcer of unspecified heel and midfoot with unspecified severity: Secondary | ICD-10-CM | POA: Insufficient documentation

## 2012-04-16 ENCOUNTER — Encounter (HOSPITAL_BASED_OUTPATIENT_CLINIC_OR_DEPARTMENT_OTHER): Payer: Medicare Other | Attending: General Surgery

## 2012-04-16 DIAGNOSIS — E1169 Type 2 diabetes mellitus with other specified complication: Secondary | ICD-10-CM | POA: Insufficient documentation

## 2012-04-16 DIAGNOSIS — L8992 Pressure ulcer of unspecified site, stage 2: Secondary | ICD-10-CM | POA: Insufficient documentation

## 2012-04-16 DIAGNOSIS — L89609 Pressure ulcer of unspecified heel, unspecified stage: Secondary | ICD-10-CM | POA: Insufficient documentation

## 2012-05-21 ENCOUNTER — Encounter (HOSPITAL_BASED_OUTPATIENT_CLINIC_OR_DEPARTMENT_OTHER): Payer: Medicare Other

## 2012-09-03 ENCOUNTER — Ambulatory Visit (INDEPENDENT_AMBULATORY_CARE_PROVIDER_SITE_OTHER): Payer: Medicare Other | Admitting: Ophthalmology

## 2012-09-03 DIAGNOSIS — H35039 Hypertensive retinopathy, unspecified eye: Secondary | ICD-10-CM

## 2012-09-03 DIAGNOSIS — E1165 Type 2 diabetes mellitus with hyperglycemia: Secondary | ICD-10-CM

## 2012-09-03 DIAGNOSIS — E1139 Type 2 diabetes mellitus with other diabetic ophthalmic complication: Secondary | ICD-10-CM

## 2012-09-03 DIAGNOSIS — I10 Essential (primary) hypertension: Secondary | ICD-10-CM

## 2012-09-03 DIAGNOSIS — H2510 Age-related nuclear cataract, unspecified eye: Secondary | ICD-10-CM

## 2012-09-03 DIAGNOSIS — H43819 Vitreous degeneration, unspecified eye: Secondary | ICD-10-CM

## 2012-09-03 DIAGNOSIS — E11359 Type 2 diabetes mellitus with proliferative diabetic retinopathy without macular edema: Secondary | ICD-10-CM

## 2012-09-03 DIAGNOSIS — E11319 Type 2 diabetes mellitus with unspecified diabetic retinopathy without macular edema: Secondary | ICD-10-CM

## 2012-09-24 ENCOUNTER — Encounter (INDEPENDENT_AMBULATORY_CARE_PROVIDER_SITE_OTHER): Payer: Medicare Other | Admitting: Ophthalmology

## 2012-09-24 DIAGNOSIS — H33009 Unspecified retinal detachment with retinal break, unspecified eye: Secondary | ICD-10-CM

## 2012-09-24 DIAGNOSIS — H33309 Unspecified retinal break, unspecified eye: Secondary | ICD-10-CM

## 2012-10-27 ENCOUNTER — Encounter (INDEPENDENT_AMBULATORY_CARE_PROVIDER_SITE_OTHER): Payer: Medicare Other | Admitting: Ophthalmology

## 2012-11-04 ENCOUNTER — Encounter (INDEPENDENT_AMBULATORY_CARE_PROVIDER_SITE_OTHER): Payer: Medicare Other | Admitting: Ophthalmology

## 2012-11-04 DIAGNOSIS — H33009 Unspecified retinal detachment with retinal break, unspecified eye: Secondary | ICD-10-CM

## 2012-12-10 ENCOUNTER — Other Ambulatory Visit: Payer: Self-pay | Admitting: Surgery

## 2012-12-17 ENCOUNTER — Encounter (INDEPENDENT_AMBULATORY_CARE_PROVIDER_SITE_OTHER): Payer: Medicare Other | Admitting: Ophthalmology

## 2012-12-20 ENCOUNTER — Encounter (INDEPENDENT_AMBULATORY_CARE_PROVIDER_SITE_OTHER): Payer: Medicare Other | Admitting: Ophthalmology

## 2013-01-03 ENCOUNTER — Encounter (INDEPENDENT_AMBULATORY_CARE_PROVIDER_SITE_OTHER): Payer: Medicare Other | Admitting: Ophthalmology

## 2013-01-03 DIAGNOSIS — H35039 Hypertensive retinopathy, unspecified eye: Secondary | ICD-10-CM

## 2013-01-03 DIAGNOSIS — E11319 Type 2 diabetes mellitus with unspecified diabetic retinopathy without macular edema: Secondary | ICD-10-CM

## 2013-01-03 DIAGNOSIS — E1139 Type 2 diabetes mellitus with other diabetic ophthalmic complication: Secondary | ICD-10-CM

## 2013-01-03 DIAGNOSIS — H43819 Vitreous degeneration, unspecified eye: Secondary | ICD-10-CM

## 2013-01-03 DIAGNOSIS — H33009 Unspecified retinal detachment with retinal break, unspecified eye: Secondary | ICD-10-CM

## 2013-01-03 DIAGNOSIS — I10 Essential (primary) hypertension: Secondary | ICD-10-CM

## 2013-01-03 DIAGNOSIS — E11359 Type 2 diabetes mellitus with proliferative diabetic retinopathy without macular edema: Secondary | ICD-10-CM

## 2013-01-03 DIAGNOSIS — E1165 Type 2 diabetes mellitus with hyperglycemia: Secondary | ICD-10-CM

## 2013-01-10 DIAGNOSIS — H33001 Unspecified retinal detachment with retinal break, right eye: Secondary | ICD-10-CM

## 2013-01-10 HISTORY — DX: Unspecified retinal detachment with retinal break, right eye: H33.001

## 2013-01-10 NOTE — H&P (Signed)
[name] [name] is an 79 y.o. female.   Chief Complaint: Loss of visual field right eye HPI: Had retinal break with detachment.  Walled off with laser. Now extended past the equator right eye  Past Medical History  Diagnosis Date  . Hypertension   . Depression   . CAD (coronary artery disease)   . GERD (gastroesophageal reflux disease)   . Bilateral carotid artery stenosis   . Diverticulitis   . Obesity   . Degenerative joint disease   . Hiatal hernia   . Dementia     early   . Atrial fibrillation with rapid ventricular response   . Hypothyroidism   . Type II diabetes mellitus   . Anemia   . Stroke 2011    "old very small TIA/neurologist"  . Chronic lower back pain     "back is very stiff because of fusion; can't stand up straight more than few moments"    Past Surgical History  Procedure Laterality Date  . Ptca  03/10/12    LLE  . Coronary artery bypass graft  04/17/2011    LT  internal mammary artery to left anterior descending, saphenous vein graft first diagonal, saphenous  vein graft to obtuse marginal 1, saphenous vein graft to posterior descending  . Cholecystectomy    . Posterior fusion lumbar spine      "removed L4-5, S1"  . Total knee arthroplasty  ~ 2004    right  . Partial hip arthroplasty  11/2009    left; "just the ball"  . Cataract extraction w/ intraocular lens  implant, bilateral    . Neuroplasty / transposition median nerve at carpal tunnel bilateral    . Hand surgery      "for arthritis; right hand"  . Joint replacement    . Dilation and curettage of uterus      "for endometrosis"    No family history on file. Social History:  reports that she quit smoking about 34 years ago. Her smoking use included Cigarettes. She smoked 1.50 packs per day. She has never used smokeless tobacco. She reports that she does not drink alcohol or use illicit drugs.  Allergies:  Allergies  Allergen Reactions  . Vicodin (Hydrocodone-Acetaminophen) Other (See Comments)    hallucinations  . Polysaccharide Iron Complex Hives, Itching and Rash    Patient can tolerate Ferrous Sulfate    No prescriptions prior to admission    Review of systems otherwise negative  There were no vitals taken for this visit.  Physical exam: Mental status: oriented x3. Eyes: See eye exam associated with this date of surgery in media tab.  Scanned in by scanning center Ears, Nose, Throat: within normal limits Neck: Within Normal limits General: within normal limits Chest: Within normal limits Breast: deferred Heart: Within normal limits Abdomen: Within normal limits GU: deferred Extremities: within normal limits Skin: within normal limits  Assessment/Plan Had retinal break with detachment.  Walled off with laser. Now extended past the equator right eye Plan: To [facility] for Scleral buckle right eye with possible laser and gas injection  [name] [name] 01/10/2013, 5:19 PM

## 2013-01-13 HISTORY — PX: RETINAL DETACHMENT SURGERY: SHX105

## 2013-01-18 ENCOUNTER — Encounter (HOSPITAL_COMMUNITY): Payer: Self-pay | Admitting: Pharmacy Technician

## 2013-01-19 ENCOUNTER — Encounter (HOSPITAL_COMMUNITY): Payer: Self-pay | Admitting: *Deleted

## 2013-01-19 MED ORDER — CEFAZOLIN SODIUM-DEXTROSE 2-3 GM-% IV SOLR
2.0000 g | INTRAVENOUS | Status: AC
  Administered 2013-01-20: 2 g via INTRAVENOUS
  Filled 2013-01-19: qty 50

## 2013-01-19 MED ORDER — TROPICAMIDE 1 % OP SOLN
1.0000 [drp] | OPHTHALMIC | Status: AC | PRN
  Administered 2013-01-20 (×3): 1 [drp] via OPHTHALMIC
  Filled 2013-01-19: qty 3

## 2013-01-19 MED ORDER — GATIFLOXACIN 0.5 % OP SOLN
1.0000 [drp] | OPHTHALMIC | Status: AC | PRN
  Administered 2013-01-20 (×3): 1 [drp] via OPHTHALMIC
  Filled 2013-01-19: qty 2.5

## 2013-01-19 MED ORDER — CYCLOPENTOLATE HCL 1 % OP SOLN
1.0000 [drp] | OPHTHALMIC | Status: AC | PRN
  Administered 2013-01-20 (×3): 1 [drp] via OPHTHALMIC
  Filled 2013-01-19: qty 2

## 2013-01-19 MED ORDER — PHENYLEPHRINE HCL 2.5 % OP SOLN
1.0000 [drp] | OPHTHALMIC | Status: AC | PRN
  Administered 2013-01-20 (×3): 1 [drp] via OPHTHALMIC
  Filled 2013-01-19: qty 3

## 2013-01-19 NOTE — Progress Notes (Signed)
Pt denies SOB and chest pain but sees Dr. Eldridge Dace,  a cardiologist  at [facility]. Pt admits to having Dementia and demonstrates impairment at times during assessment.

## 2013-01-19 NOTE — Progress Notes (Signed)
Pt states that she is scheduled to see Dr. Ashley Royalty at 7:30 am on the day of surgery and she was given the time to arrive and the medications to take by Dr. Ashley Royalty staff. Pt states that her daughter has the information with her.

## 2013-01-19 NOTE — Progress Notes (Signed)
An attempt was made to complete the Same Day Work-up (SDW) pre-op call. However, pt was a poor historian and admitted that she has dementia. Pt suggested that I speak with her daughter " after dinner around 7:00PM" to complete the assessment. An attempt to complete pre-op call ( SDW ) will be made then.

## 2013-01-20 ENCOUNTER — Encounter (INDEPENDENT_AMBULATORY_CARE_PROVIDER_SITE_OTHER): Payer: Medicare Other | Admitting: Ophthalmology

## 2013-01-20 ENCOUNTER — Ambulatory Visit (HOSPITAL_COMMUNITY): Payer: Medicare Other

## 2013-01-20 ENCOUNTER — Ambulatory Visit (HOSPITAL_COMMUNITY)
Admission: RE | Admit: 2013-01-20 | Discharge: 2013-01-21 | Disposition: A | Discharge status: Home / Self Care | Payer: Medicare Other | Source: Ambulatory Visit | Attending: Ophthalmology | Admitting: Ophthalmology

## 2013-01-20 ENCOUNTER — Encounter (HOSPITAL_COMMUNITY)
Admission: RE | Disposition: A | Discharge status: Home / Self Care | Payer: Self-pay | Source: Ambulatory Visit | Attending: Ophthalmology

## 2013-01-20 ENCOUNTER — Encounter (HOSPITAL_COMMUNITY): Payer: Self-pay

## 2013-01-20 ENCOUNTER — Ambulatory Visit (HOSPITAL_COMMUNITY): Payer: Medicare Other | Admitting: Anesthesiology

## 2013-01-20 ENCOUNTER — Encounter (HOSPITAL_COMMUNITY): Payer: Self-pay | Admitting: Anesthesiology

## 2013-01-20 DIAGNOSIS — Z981 Arthrodesis status: Secondary | ICD-10-CM | POA: Insufficient documentation

## 2013-01-20 DIAGNOSIS — F0390 Unspecified dementia without behavioral disturbance: Secondary | ICD-10-CM | POA: Insufficient documentation

## 2013-01-20 DIAGNOSIS — H43819 Vitreous degeneration, unspecified eye: Secondary | ICD-10-CM

## 2013-01-20 DIAGNOSIS — E11359 Type 2 diabetes mellitus with proliferative diabetic retinopathy without macular edema: Secondary | ICD-10-CM

## 2013-01-20 DIAGNOSIS — E11319 Type 2 diabetes mellitus with unspecified diabetic retinopathy without macular edema: Secondary | ICD-10-CM

## 2013-01-20 DIAGNOSIS — E1165 Type 2 diabetes mellitus with hyperglycemia: Secondary | ICD-10-CM

## 2013-01-20 DIAGNOSIS — I10 Essential (primary) hypertension: Secondary | ICD-10-CM

## 2013-01-20 DIAGNOSIS — H35039 Hypertensive retinopathy, unspecified eye: Secondary | ICD-10-CM

## 2013-01-20 DIAGNOSIS — H33009 Unspecified retinal detachment with retinal break, unspecified eye: Secondary | ICD-10-CM | POA: Insufficient documentation

## 2013-01-20 DIAGNOSIS — H33001 Unspecified retinal detachment with retinal break, right eye: Secondary | ICD-10-CM

## 2013-01-20 DIAGNOSIS — E119 Type 2 diabetes mellitus without complications: Secondary | ICD-10-CM | POA: Insufficient documentation

## 2013-01-20 DIAGNOSIS — Z951 Presence of aortocoronary bypass graft: Secondary | ICD-10-CM | POA: Insufficient documentation

## 2013-01-20 HISTORY — PX: SCLERAL BUCKLE: SHX5340

## 2013-01-20 HISTORY — PX: LASER PHOTO ABLATION: SHX5942

## 2013-01-20 HISTORY — DX: Adverse effect of unspecified anesthetic, initial encounter: T41.45XA

## 2013-01-20 HISTORY — DX: Other complications of anesthesia, initial encounter: T88.59XA

## 2013-01-20 LAB — GLUCOSE, CAPILLARY
Glucose-Capillary: 86 mg/dL (ref 70–99)
Glucose-Capillary: 71 mg/dL (ref 70–99)
Glucose-Capillary: 159 mg/dL — ABNORMAL HIGH (ref 70–99)
Glucose-Capillary: 120 mg/dL — ABNORMAL HIGH (ref 70–99)
Glucose-Capillary: 183 mg/dL — ABNORMAL HIGH (ref 70–99)

## 2013-01-20 LAB — BASIC METABOLIC PANEL
BUN: 29 mg/dL — ABNORMAL HIGH (ref 6–23)
CO2: 28 mEq/L (ref 19–32)
Chloride: 106 mEq/L (ref 96–112)
Creatinine, Ser: 1.13 mg/dL — ABNORMAL HIGH (ref 0.50–1.10)
Glucose, Bld: 86 mg/dL (ref 70–99)

## 2013-01-20 LAB — CBC
HCT: 35.5 % — ABNORMAL LOW (ref 36.0–46.0)
MCH: 32.5 pg (ref 26.0–34.0)
MCHC: 33.8 g/dL (ref 30.0–36.0)
MCV: 96.2 fL (ref 78.0–100.0)
RDW: 12.8 % (ref 11.5–15.5)

## 2013-01-20 SURGERY — SCLERAL BUCKLE
Anesthesia: General | Site: Eye | Laterality: Right | Wound class: Clean

## 2013-01-20 MED ORDER — ONDANSETRON HCL 4 MG/2ML IJ SOLN
INTRAMUSCULAR | Status: DC | PRN
  Administered 2013-01-20: 4 mg via INTRAVENOUS

## 2013-01-20 MED ORDER — INSULIN GLARGINE 100 UNIT/ML SOLOSTAR PEN: 10.0000 [IU] | PEN_INJECTOR | Freq: Every day | SUBCUTANEOUS | Status: DC

## 2013-01-20 MED ORDER — ACETAMINOPHEN 325 MG PO TABS
325.0000 mg | ORAL_TABLET | ORAL | Status: DC | PRN
  Administered 2013-01-21: 650 mg via ORAL
  Filled 2013-01-20: qty 2

## 2013-01-20 MED ORDER — PROPOFOL 10 MG/ML IV BOLUS
INTRAVENOUS | Status: DC | PRN
  Administered 2013-01-20: 150 mg via INTRAVENOUS
  Administered 2013-01-20: 30 mg via INTRAVENOUS
  Administered 2013-01-20: 20 mg via INTRAVENOUS

## 2013-01-20 MED ORDER — MIRABEGRON ER 50 MG PO TB24
50.0000 mg | ORAL_TABLET | Freq: Every evening | ORAL | Status: DC
  Administered 2013-01-20: 50 mg via ORAL
  Filled 2013-01-20 (×2): qty 1

## 2013-01-20 MED ORDER — BRIMONIDINE TARTRATE 0.2 % OP SOLN
1.0000 [drp] | Freq: Two times a day (BID) | OPHTHALMIC | Status: DC
  Filled 2013-01-20: qty 5

## 2013-01-20 MED ORDER — ROCURONIUM BROMIDE 100 MG/10ML IV SOLN
INTRAVENOUS | Status: DC | PRN
  Administered 2013-01-20: 50 mg via INTRAVENOUS

## 2013-01-20 MED ORDER — VITAMIN C 500 MG PO TABS
1000.0000 mg | ORAL_TABLET | ORAL | Status: DC
  Administered 2013-01-21: 1000 mg via ORAL
  Filled 2013-01-20 (×2): qty 2

## 2013-01-20 MED ORDER — SIMVASTATIN 5 MG PO TABS
5.0000 mg | ORAL_TABLET | Freq: Every day | ORAL | Status: DC
  Filled 2013-01-20: qty 1

## 2013-01-20 MED ORDER — MORPHINE SULFATE 2 MG/ML IJ SOLN: 1.0000 mg | INTRAMUSCULAR | Status: DC | PRN

## 2013-01-20 MED ORDER — ONDANSETRON HCL 4 MG/2ML IJ SOLN: 4.0000 mg | Freq: Four times a day (QID) | INTRAMUSCULAR | Status: DC | PRN

## 2013-01-20 MED ORDER — FENTANYL CITRATE 0.05 MG/ML IJ SOLN
25.0000 ug | INTRAMUSCULAR | Status: DC | PRN
  Administered 2013-01-20: 25 ug via INTRAVENOUS

## 2013-01-20 MED ORDER — MUPIROCIN 2 % EX OINT: TOPICAL_OINTMENT | Freq: Two times a day (BID) | CUTANEOUS | Status: DC

## 2013-01-20 MED ORDER — VITAMIN D (ERGOCALCIFEROL) 1.25 MG (50000 UNIT) PO CAPS: 50000.0000 [IU] | ORAL_CAPSULE | ORAL | Status: DC

## 2013-01-20 MED ORDER — WHITE PETROLATUM GEL
Status: AC
  Administered 2013-01-20: 21:00:00
  Filled 2013-01-20: qty 5

## 2013-01-20 MED ORDER — SODIUM CHLORIDE 0.9 % IJ SOLN
INTRAMUSCULAR | Status: DC | PRN
  Administered 2013-01-20: 16:00:00

## 2013-01-20 MED ORDER — GATIFLOXACIN 0.5 % OP SOLN
1.0000 [drp] | Freq: Four times a day (QID) | OPHTHALMIC | Status: DC
  Administered 2013-01-21: 1 [drp] via OPHTHALMIC
  Filled 2013-01-20 (×2): qty 2.5

## 2013-01-20 MED ORDER — MINERAL OIL LIGHT 100 % EX OIL
TOPICAL_OIL | CUTANEOUS | Status: AC
  Filled 2013-01-20: qty 25

## 2013-01-20 MED ORDER — LACTATED RINGERS IV SOLN
INTRAVENOUS | Status: DC | PRN
  Administered 2013-01-20: 16:00:00 via INTRAVENOUS

## 2013-01-20 MED ORDER — BACITRACIN-POLYMYXIN B 500-10000 UNIT/GM OP OINT
1.0000 "application " | TOPICAL_OINTMENT | Freq: Four times a day (QID) | OPHTHALMIC | Status: DC
  Administered 2013-01-21: 1 via OPHTHALMIC
  Filled 2013-01-20 (×2): qty 3.5

## 2013-01-20 MED ORDER — BACITRACIN-POLYMYXIN B 500-10000 UNIT/GM OP OINT
TOPICAL_OINTMENT | OPHTHALMIC | Status: DC | PRN
  Administered 2013-01-20: 1 via OPHTHALMIC

## 2013-01-20 MED ORDER — ATROPINE SULFATE 1 % OP SOLN
OPHTHALMIC | Status: DC | PRN
  Administered 2013-01-20: 2 [drp] via OPHTHALMIC

## 2013-01-20 MED ORDER — METOPROLOL TARTRATE 25 MG PO TABS
25.0000 mg | ORAL_TABLET | Freq: Two times a day (BID) | ORAL | Status: DC
  Administered 2013-01-20 – 2013-01-21 (×2): 25 mg via ORAL
  Filled 2013-01-20 (×4): qty 1

## 2013-01-20 MED ORDER — BUPIVACAINE HCL (PF) 0.75 % IJ SOLN
INTRAMUSCULAR | Status: AC
  Filled 2013-01-20: qty 10

## 2013-01-20 MED ORDER — HYPROMELLOSE (GONIOSCOPIC) 2.5 % OP SOLN
OPHTHALMIC | Status: AC
  Filled 2013-01-20: qty 15

## 2013-01-20 MED ORDER — DEXTROSE 50 % IV SOLN: 25.0000 g | Freq: Once | INTRAVENOUS | Status: AC

## 2013-01-20 MED ORDER — PROMETHAZINE HCL 25 MG/ML IJ SOLN: 6.2500 mg | INTRAMUSCULAR | Status: DC | PRN

## 2013-01-20 MED ORDER — VITAMIN D3 25 MCG (1000 UNIT) PO TABS: 1000.0000 [IU] | ORAL_TABLET | ORAL | Status: DC

## 2013-01-20 MED ORDER — 0.9 % SODIUM CHLORIDE (POUR BTL) OPTIME
TOPICAL | Status: DC | PRN
  Administered 2013-01-20: 1000 mL

## 2013-01-20 MED ORDER — TETRACAINE HCL 0.5 % OP SOLN
2.0000 [drp] | Freq: Once | OPHTHALMIC | Status: DC
  Filled 2013-01-20: qty 2

## 2013-01-20 MED ORDER — PREDNISOLONE ACETATE 1 % OP SUSP
1.0000 [drp] | Freq: Four times a day (QID) | OPHTHALMIC | Status: DC
  Administered 2013-01-21: 1 [drp] via OPHTHALMIC
  Filled 2013-01-20: qty 1
  Filled 2013-01-20: qty 5

## 2013-01-20 MED ORDER — TEMAZEPAM 15 MG PO CAPS: 15.0000 mg | ORAL_CAPSULE | Freq: Every evening | ORAL | Status: DC | PRN

## 2013-01-20 MED ORDER — HYDROCODONE-ACETAMINOPHEN 5-325 MG PO TABS
1.0000 | ORAL_TABLET | ORAL | Status: DC | PRN
  Administered 2013-01-21: 1 via ORAL
  Filled 2013-01-20: qty 1

## 2013-01-20 MED ORDER — GENTAMICIN SULFATE 40 MG/ML IJ SOLN
INTRAMUSCULAR | Status: AC
  Filled 2013-01-20: qty 2

## 2013-01-20 MED ORDER — FLUOXETINE HCL 20 MG PO CAPS
20.0000 mg | ORAL_CAPSULE | Freq: Every morning | ORAL | Status: DC
  Administered 2013-01-21: 20 mg via ORAL
  Filled 2013-01-20: qty 1

## 2013-01-20 MED ORDER — DEXAMETHASONE SODIUM PHOSPHATE 10 MG/ML IJ SOLN
INTRAMUSCULAR | Status: AC
  Filled 2013-01-20: qty 1

## 2013-01-20 MED ORDER — NEOSTIGMINE METHYLSULFATE 1 MG/ML IJ SOLN
INTRAMUSCULAR | Status: DC | PRN
  Administered 2013-01-20: 5 mg via INTRAVENOUS

## 2013-01-20 MED ORDER — POLYMYXIN B SULFATE 500000 UNITS IJ SOLR
INTRAMUSCULAR | Status: AC
  Filled 2013-01-20: qty 1

## 2013-01-20 MED ORDER — HYDROXYZINE HCL 25 MG PO TABS
25.0000 mg | ORAL_TABLET | Freq: Four times a day (QID) | ORAL | Status: DC | PRN
  Filled 2013-01-20: qty 1

## 2013-01-20 MED ORDER — LATANOPROST 0.005 % OP SOLN
1.0000 [drp] | Freq: Every day | OPHTHALMIC | Status: DC
  Filled 2013-01-20: qty 2.5

## 2013-01-20 MED ORDER — BUPIVACAINE HCL (PF) 0.75 % IJ SOLN
INTRAMUSCULAR | Status: DC | PRN
  Administered 2013-01-20: 10 mL

## 2013-01-20 MED ORDER — BACITRACIN-POLYMYXIN B 500-10000 UNIT/GM OP OINT
TOPICAL_OINTMENT | OPHTHALMIC | Status: AC
  Filled 2013-01-20: qty 3.5

## 2013-01-20 MED ORDER — DOCUSATE SODIUM 100 MG PO CAPS
100.0000 mg | ORAL_CAPSULE | Freq: Two times a day (BID) | ORAL | Status: DC
  Administered 2013-01-20 – 2013-01-21 (×2): 100 mg via ORAL
  Filled 2013-01-20 (×2): qty 1

## 2013-01-20 MED ORDER — MUPIROCIN 2 % EX OINT
TOPICAL_OINTMENT | CUTANEOUS | Status: AC
  Administered 2013-01-20: 11:00:00 via NASAL
  Filled 2013-01-20: qty 22

## 2013-01-20 MED ORDER — SODIUM CHLORIDE 0.9 % IV SOLN
INTRAVENOUS | Status: DC
  Administered 2013-01-20: 13:00:00 via INTRAVENOUS

## 2013-01-20 MED ORDER — LIDOCAINE HCL 2 % IJ SOLN
INTRAMUSCULAR | Status: AC
  Filled 2013-01-20: qty 20

## 2013-01-20 MED ORDER — ATROPINE SULFATE 1 % OP SOLN
OPHTHALMIC | Status: AC
  Filled 2013-01-20: qty 2

## 2013-01-20 MED ORDER — QUETIAPINE FUMARATE 25 MG PO TABS
25.0000 mg | ORAL_TABLET | Freq: Every day | ORAL | Status: DC
  Administered 2013-01-20: 12.5 mg via ORAL
  Filled 2013-01-20 (×2): qty 1

## 2013-01-20 MED ORDER — SODIUM CHLORIDE 0.45 % IV SOLN: INTRAVENOUS | Status: DC

## 2013-01-20 MED ORDER — INSULIN GLARGINE 100 UNIT/ML ~~LOC~~ SOLN
10.0000 [IU] | Freq: Every day | SUBCUTANEOUS | Status: DC
  Administered 2013-01-20: 10 [IU] via SUBCUTANEOUS
  Filled 2013-01-20 (×2): qty 0.1

## 2013-01-20 MED ORDER — GLIPIZIDE ER 5 MG PO TB24
5.0000 mg | ORAL_TABLET | Freq: Every day | ORAL | Status: DC
  Administered 2013-01-20: 5 mg via ORAL
  Filled 2013-01-20 (×2): qty 1

## 2013-01-20 MED ORDER — PIOGLITAZONE HCL 30 MG PO TABS
30.0000 mg | ORAL_TABLET | Freq: Every morning | ORAL | Status: DC
  Administered 2013-01-21: 30 mg via ORAL
  Filled 2013-01-20 (×2): qty 1

## 2013-01-20 MED ORDER — VITAMIN D3 25 MCG (1000 UNIT) PO TABS
1000.0000 [IU] | ORAL_TABLET | ORAL | Status: DC
  Administered 2013-01-21: 1000 [IU] via ORAL
  Filled 2013-01-20 (×2): qty 1

## 2013-01-20 MED ORDER — LEVOTHYROXINE SODIUM 125 MCG PO TABS
125.0000 ug | ORAL_TABLET | Freq: Every day | ORAL | Status: DC
  Administered 2013-01-21: 125 ug via ORAL
  Filled 2013-01-20 (×2): qty 1

## 2013-01-20 MED ORDER — DEXTROSE 50 % IV SOLN
INTRAVENOUS | Status: AC
  Administered 2013-01-20: 25 g via INTRAVENOUS
  Filled 2013-01-20: qty 50

## 2013-01-20 MED ORDER — DEXTROSE 5 % IV SOLN
INTRAVENOUS | Status: DC | PRN
  Administered 2013-01-20: 16:00:00 via INTRAVENOUS

## 2013-01-20 MED ORDER — ERGOCALCIFEROL 1.25 MG (50000 UT) PO CAPS: 50000.0000 [IU] | ORAL_CAPSULE | ORAL | Status: DC

## 2013-01-20 MED ORDER — LIDOCAINE HCL (CARDIAC) 20 MG/ML IV SOLN: INTRAVENOUS | Status: DC | PRN

## 2013-01-20 MED ORDER — ARTIFICIAL TEARS OP OINT
TOPICAL_OINTMENT | OPHTHALMIC | Status: DC | PRN
  Administered 2013-01-20: 1 via OPHTHALMIC

## 2013-01-20 MED ORDER — LABETALOL HCL 5 MG/ML IV SOLN
INTRAVENOUS | Status: DC | PRN
  Administered 2013-01-20 (×3): 2.5 mg via INTRAVENOUS
  Administered 2013-01-20 (×2): 5 mg via INTRAVENOUS
  Administered 2013-01-20: 2.5 mg via INTRAVENOUS

## 2013-01-20 MED ORDER — SODIUM HYALURONATE 10 MG/ML IO SOLN
INTRAOCULAR | Status: AC
  Filled 2013-01-20: qty 0.85

## 2013-01-20 MED ORDER — METFORMIN HCL 500 MG PO TABS
500.0000 mg | ORAL_TABLET | Freq: Two times a day (BID) | ORAL | Status: DC
  Administered 2013-01-21: 500 mg via ORAL
  Filled 2013-01-20 (×3): qty 1

## 2013-01-20 MED ORDER — DEXAMETHASONE SODIUM PHOSPHATE 10 MG/ML IJ SOLN
INTRAMUSCULAR | Status: DC | PRN
  Administered 2013-01-20: 10 mg

## 2013-01-20 MED ORDER — SODIUM CHLORIDE 0.9 % IV SOLN
INTRAVENOUS | Status: DC | PRN
  Administered 2013-01-20: 14:00:00 via INTRAVENOUS

## 2013-01-20 MED ORDER — TRAMADOL HCL 50 MG PO TABS
50.0000 mg | ORAL_TABLET | Freq: Four times a day (QID) | ORAL | Status: DC | PRN
  Administered 2013-01-20: 50 mg via ORAL
  Filled 2013-01-20: qty 1

## 2013-01-20 MED ORDER — BIMATOPROST 0.03 % OP SOLN
1.0000 [drp] | Freq: Every day | OPHTHALMIC | Status: DC
  Filled 2013-01-20: qty 2.5

## 2013-01-20 MED ORDER — ACETAZOLAMIDE SODIUM 500 MG IJ SOLR
INTRAMUSCULAR | Status: AC
  Filled 2013-01-20: qty 500

## 2013-01-20 MED ORDER — GLYCOPYRROLATE 0.2 MG/ML IJ SOLN
INTRAMUSCULAR | Status: DC | PRN
  Administered 2013-01-20: .8 mg via INTRAVENOUS

## 2013-01-20 MED ORDER — TRIAMCINOLONE ACETONIDE 40 MG/ML IJ SUSP
INTRAMUSCULAR | Status: AC
  Filled 2013-01-20: qty 5

## 2013-01-20 MED ORDER — BSS IO SOLN
INTRAOCULAR | Status: DC | PRN
  Administered 2013-01-20: 15 mL via INTRAOCULAR

## 2013-01-20 MED ORDER — EPINEPHRINE HCL 1 MG/ML IJ SOLN
INTRAMUSCULAR | Status: AC
  Filled 2013-01-20: qty 1

## 2013-01-20 MED ORDER — MEMANTINE HCL 10 MG PO TABS
10.0000 mg | ORAL_TABLET | Freq: Every morning | ORAL | Status: DC
  Administered 2013-01-21: 10 mg via ORAL
  Filled 2013-01-20 (×2): qty 1

## 2013-01-20 MED ORDER — CLOPIDOGREL BISULFATE 75 MG PO TABS
75.0000 mg | ORAL_TABLET | Freq: Every day | ORAL | Status: DC
  Administered 2013-01-21: 75 mg via ORAL
  Filled 2013-01-20 (×2): qty 1

## 2013-01-20 MED ORDER — FELODIPINE ER 5 MG PO TB24
5.0000 mg | ORAL_TABLET | Freq: Every morning | ORAL | Status: DC
  Administered 2013-01-21: 5 mg via ORAL
  Filled 2013-01-20: qty 1

## 2013-01-20 MED ORDER — PANTOPRAZOLE SODIUM 40 MG PO TBEC
40.0000 mg | DELAYED_RELEASE_TABLET | Freq: Every day | ORAL | Status: DC
  Administered 2013-01-21: 40 mg via ORAL
  Filled 2013-01-20: qty 1

## 2013-01-20 MED ORDER — LIDOCAINE HCL (CARDIAC) 20 MG/ML IV SOLN
INTRAVENOUS | Status: DC | PRN
  Administered 2013-01-20: 75 mg via INTRAVENOUS

## 2013-01-20 MED ORDER — BSS IO SOLN
INTRAOCULAR | Status: AC
  Filled 2013-01-20: qty 15

## 2013-01-20 MED ORDER — FENTANYL CITRATE 0.05 MG/ML IJ SOLN
INTRAMUSCULAR | Status: AC
  Administered 2013-01-20: 25 ug via INTRAVENOUS
  Filled 2013-01-20: qty 2

## 2013-01-20 MED ORDER — ASPIRIN 81 MG PO CHEW
81.0000 mg | CHEWABLE_TABLET | Freq: Every day | ORAL | Status: DC
  Administered 2013-01-20 – 2013-01-21 (×2): 81 mg via ORAL
  Filled 2013-01-20 (×3): qty 1

## 2013-01-20 MED ORDER — HYDRALAZINE HCL 20 MG/ML IJ SOLN
INTRAMUSCULAR | Status: DC | PRN
  Administered 2013-01-20: 5 mg via INTRAVENOUS

## 2013-01-20 MED ORDER — MAGNESIUM HYDROXIDE 400 MG/5ML PO SUSP: 15.0000 mL | Freq: Four times a day (QID) | ORAL | Status: DC | PRN

## 2013-01-20 MED ORDER — ACETAZOLAMIDE SODIUM 500 MG IJ SOLR
500.0000 mg | Freq: Once | INTRAMUSCULAR | Status: AC
  Administered 2013-01-21: 500 mg via INTRAVENOUS
  Filled 2013-01-20: qty 500

## 2013-01-20 MED ORDER — FENTANYL CITRATE 0.05 MG/ML IJ SOLN
INTRAMUSCULAR | Status: DC | PRN
  Administered 2013-01-20 (×5): 50 ug via INTRAVENOUS

## 2013-01-20 SURGICAL SUPPLY — 64 items
APPLICATOR DR MATTHEWS STRL (MISCELLANEOUS) ×16 IMPLANT
BAND SCLERAL BUCKLING TYPE 240 (Ophthalmic Related) ×2 IMPLANT
BLADE EYE CATARACT 19 1.4 BEAV (BLADE) IMPLANT
BLADE MVR KNIFE 19G (BLADE) IMPLANT
COTTONBALL LRG STERILE PKG (GAUZE/BANDAGES/DRESSINGS) ×6 IMPLANT
COVER SURGICAL LIGHT HANDLE (MISCELLANEOUS) ×2 IMPLANT
DRAPE OPHTHALMIC 77X100 STRL (CUSTOM PROCEDURE TRAY) ×2 IMPLANT
FILTER BLUE MILLIPORE (MISCELLANEOUS) IMPLANT
FILTER STRAW FLUID ASPIR (MISCELLANEOUS) IMPLANT
GAS OPHTHALMIC (MISCELLANEOUS) IMPLANT
GLOVE BIOGEL PI IND STRL 6.5 (GLOVE) ×2 IMPLANT
GLOVE BIOGEL PI INDICATOR 6.5 (GLOVE) ×2
GLOVE SS BIOGEL STRL SZ 6.5 (GLOVE) ×2 IMPLANT
GLOVE SS BIOGEL STRL SZ 7 (GLOVE) ×1 IMPLANT
GLOVE SUPERSENSE BIOGEL SZ 6.5 (GLOVE) ×2
GLOVE SUPERSENSE BIOGEL SZ 7 (GLOVE) ×1
GLOVE SURG 8.5 LATEX PF (GLOVE) ×6 IMPLANT
GOWN STRL NON-REIN LRG LVL3 (GOWN DISPOSABLE) ×4 IMPLANT
ILLUMINATOR CHOW PICK 25GA (MISCELLANEOUS) IMPLANT
KIT BASIN OR (CUSTOM PROCEDURE TRAY) ×2 IMPLANT
KIT PERFLUORON PROCEDURE 5ML (MISCELLANEOUS) IMPLANT
KIT ROOM TURNOVER OR (KITS) ×2 IMPLANT
KNIFE CRESCENT 1.75 EDGEAHEAD (BLADE) IMPLANT
KNIFE GRIESHABER SHARP 2.5MM (MISCELLANEOUS) ×6 IMPLANT
MARKER SKIN DUAL TIP RULER LAB (MISCELLANEOUS) IMPLANT
MASK EYE SHIELD (GAUZE/BANDAGES/DRESSINGS) ×2 IMPLANT
NEEDLE 18GX1X1/2 (RX/OR ONLY) (NEEDLE) ×2 IMPLANT
NEEDLE 25GX 5/8IN NON SAFETY (NEEDLE) IMPLANT
NEEDLE 27GAX1X1/2 (NEEDLE) IMPLANT
NEEDLE HYPO 30X.5 LL (NEEDLE) ×4 IMPLANT
NS IRRIG 1000ML POUR BTL (IV SOLUTION) ×2 IMPLANT
PACK VITRECTOMY CUSTOM (CUSTOM PROCEDURE TRAY) IMPLANT
PAD ARMBOARD 7.5X6 YLW CONV (MISCELLANEOUS) ×2 IMPLANT
PAD EYE OVAL STERILE LF (GAUZE/BANDAGES/DRESSINGS) ×2 IMPLANT
PAK VITRECTOMY PIK 25 GA (OPHTHALMIC RELATED) IMPLANT
PROBE DIRECTIONAL LASER (MISCELLANEOUS) IMPLANT
REPL STRA BRUSH NEEDLE (NEEDLE) IMPLANT
RESERVOIR BACK FLUSH (MISCELLANEOUS) IMPLANT
ROLLS DENTAL (MISCELLANEOUS) ×4 IMPLANT
SET FLUID INJECTOR (SET/KITS/TRAYS/PACK) IMPLANT
SET VGFI TUBING 8065808002 (SET/KITS/TRAYS/PACK) IMPLANT
SLEEVE SCLERAL TYPE 270 (Ophthalmic Related) ×2 IMPLANT
SPONGE GROOVED SILICONE 4X12X8 (Ophthalmic Related) ×2 IMPLANT
SPONGE SURGIFOAM ABS GEL 12-7 (HEMOSTASIS) IMPLANT
STOPCOCK 4 WAY LG BORE MALE ST (IV SETS) IMPLANT
SUT CHROMIC 7 0 TG140 8 (SUTURE) ×2 IMPLANT
SUT ETHILON 9 0 TG140 8 (SUTURE) IMPLANT
SUT MERSILENE 4 0 RV 2 (SUTURE) ×4 IMPLANT
SUT SILK 2 0 (SUTURE) ×1
SUT SILK 2-0 18XBRD TIE 12 (SUTURE) ×1 IMPLANT
SUT SILK 4 0 RB 1 (SUTURE) ×2 IMPLANT
SUT VICRYL 7 0 TG140 8 (SUTURE) IMPLANT
SYR 20CC LL (SYRINGE) ×2 IMPLANT
SYR 5ML LL (SYRINGE) ×2 IMPLANT
SYR BULB 3OZ (MISCELLANEOUS) ×2 IMPLANT
SYR TB 1ML LUER SLIP (SYRINGE) ×2 IMPLANT
SYRINGE 10CC LL (SYRINGE) ×2 IMPLANT
TAPE SURG TRANSPORE 1 IN (GAUZE/BANDAGES/DRESSINGS) ×1 IMPLANT
TAPE SURGICAL TRANSPORE 1 IN (GAUZE/BANDAGES/DRESSINGS) ×1
TIRE 11 SCLERAL TYPE 279 (Ophthalmic Related) ×2 IMPLANT
TOWEL OR 17X24 6PK STRL BLUE (TOWEL DISPOSABLE) ×6 IMPLANT
VITREORETINAL VISCODISSEC (MISCELLANEOUS) IMPLANT
WATER STERILE IRR 1000ML POUR (IV SOLUTION) ×2 IMPLANT
WIPE INSTRUMENT VISIWIPE 73X73 (MISCELLANEOUS) ×2 IMPLANT

## 2013-01-20 NOTE — Anesthesia Procedure Notes (Signed)
Procedure Name: Intubation Date/Time: 01/20/2013 3:37 PM Performed by: Marni Griffon Pre-anesthesia Checklist: Patient identified, Emergency Drugs available, Suction available and Patient being monitored Patient Re-evaluated:Patient Re-evaluated prior to inductionOxygen Delivery Method: Circle system utilized Preoxygenation: Pre-oxygenation with 100% oxygen Intubation Type: IV induction Ventilation: Mask ventilation without difficulty Laryngoscope Size: Mac and 3 Grade View: Grade II Tube type: Oral Tube size: 7.5 mm Airway Equipment and Method: Stylet Placement Confirmation: ETT inserted through vocal cords under direct vision,  breath sounds checked- equal and bilateral and positive ETCO2 Secured at: 21 (cm at teeth) cm Tube secured with: Tape Dental Injury: Teeth and Oropharynx as per pre-operative assessment

## 2013-01-20 NOTE — OR Nursing (Signed)
Patient denied c/o shoulder pain or discomfort after tucking procedure. Stated she felt comfortable.

## 2013-01-20 NOTE — Transfer of Care (Signed)
Immediate Anesthesia Transfer of Care Note  Patient: [name] [name]  Procedure(s) Performed: Procedure(s): SCLERAL BUCKLE (Right) LASER PHOTO ABLATION (Right)  Patient Location: PACU  Anesthesia Type:General  Level of Consciousness: awake, alert , oriented and patient cooperative  Airway & Oxygen Therapy: Patient Spontanous Breathing and Patient connected to nasal cannula oxygen  Post-op Assessment: Report given to PACU RN and Post -op Vital signs reviewed and stable  Post vital signs: Reviewed and stable  Complications: No apparent anesthesia complications

## 2013-01-20 NOTE — Preoperative (Signed)
Beta Blockers   Reason not to administer Beta Blockers:Toprol 0500 today

## 2013-01-20 NOTE — Brief Op Note (Signed)
Brief Operative note   Preoperative diagnosis:  Pre-Op Diagnosis Codes:    * Retinal detachment with retinal defect, unspecified [361.00] Postoperative diagnosis  Post-Op Diagnosis Codes:    * Retinal detachment with retinal defect, unspecified [361.00]  Procedures: Scleral buckle, laser right eye  Surgeon:  Sherrie George, MD...  Assistant:  Rosalie Doctor SA   Anesthesia: General  Specimen: none  Estimated blood loss:  1cc  Complications: none  Patient sent to PACU in good condition  Composed by Sherrie George MD  Dictation number: [phone]

## 2013-01-20 NOTE — H&P (Signed)
I examined the patient today and there is no change in the medical status 

## 2013-01-20 NOTE — Anesthesia Preprocedure Evaluation (Addendum)
Anesthesia Evaluation  Patient identified by MRN, date of birth, ID band Patient awake    Reviewed: Allergy & Precautions, H&P , NPO status , Patient's Chart, lab work & pertinent test results  Airway Mallampati: III TM Distance: <3 FB Neck ROM: Full    Dental no notable dental hx.    Pulmonary neg pulmonary ROS,  breath sounds clear to auscultation  Pulmonary exam normal       Cardiovascular hypertension, Pt. on medications + CAD and + CABG + dysrhythmias Atrial Fibrillation Rhythm:Regular Rate:Normal     Neuro/Psych TIAnegative psych ROS   GI/Hepatic negative GI ROS, Neg liver ROS,   Endo/Other  diabetes, Insulin DependentHypothyroidism   Renal/GU negative Renal ROS  negative genitourinary   Musculoskeletal negative musculoskeletal ROS (+)   Abdominal   Peds negative pediatric ROS (+)  Hematology negative hematology ROS (+)   Anesthesia Other Findings   Reproductive/Obstetrics negative OB ROS                           Anesthesia Physical Anesthesia Plan  ASA: III  Anesthesia Plan: General   Post-op Pain Management:    Induction: Intravenous  Airway Management Planned: Oral ETT  Additional Equipment:   Intra-op Plan:   Post-operative Plan: Extubation in OR  Informed Consent: I have reviewed the patients History and Physical, chart, labs and discussed the procedure including the risks, benefits and alternatives for the proposed anesthesia with the patient or authorized representative who has indicated his/her understanding and acceptance.   Dental advisory given  Plan Discussed with: CRNA and Surgeon  Anesthesia Plan Comments:         Anesthesia Quick Evaluation

## 2013-01-21 ENCOUNTER — Encounter (HOSPITAL_COMMUNITY): Payer: Self-pay | Admitting: Ophthalmology

## 2013-01-21 MED ORDER — PREDNISOLONE ACETATE 1 % OP SUSP: 1.0000 [drp] | Freq: Four times a day (QID) | OPHTHALMIC | Status: DC

## 2013-01-21 MED ORDER — BACITRACIN-POLYMYXIN B 500-10000 UNIT/GM OP OINT: 1.0000 "application " | TOPICAL_OINTMENT | Freq: Four times a day (QID) | OPHTHALMIC | Status: DC

## 2013-01-21 MED ORDER — GATIFLOXACIN 0.5 % OP SOLN: 1.0000 [drp] | Freq: Four times a day (QID) | OPHTHALMIC | Status: DC

## 2013-01-21 NOTE — Progress Notes (Signed)
1140 discharge home woth daughter. Wheeled to lobby

## 2013-01-21 NOTE — Discharge Summary (Signed)
Discharge summary not needed on [facility] patients per medical records. 

## 2013-01-21 NOTE — Progress Notes (Signed)
PHARMACIST - PHYSICIAN COMMUNICATION DR:   Ashley Royalty, Ella Jubilee.  CONCERNING:   Protonix may decrease the effect of Plavix.  It is also recommended to avoid use of Omeprazole (PPI pt was taking at home), specifically.     Recommendations: 1.  D/C Protonix now 2.  D/C Omeprazole on discharge 3.  Pepcid 20mg  po daily (adjusted for CrCl) here and continued on discharge.  Marisue Humble, PharmD Clinical Pharmacist [facility]

## 2013-01-21 NOTE — Progress Notes (Signed)
01/21/2013, 6:42 AM  Mental Status:  Awake, Alert, Oriented  Anterior segment: Cornea  Clear    Anterior Chamber Clear    Lens:   IOL  Intra Ocular Pressure 20 mmHg with Tonopen  Vitreous: Clear  Retina:  Attached Good laser reaction   Impression: Excellent result Retina attached  Final Diagnosis: Principal Problem:   Rhegmatogenous retinal detachment of right eye   Plan: start post operative eye drops.  Discharge to home.  Give post operative instructions  Sherrie George 01/21/2013, 6:42 AM

## 2013-01-21 NOTE — Op Note (Signed)
[name] [name], BANKHEAD NO.:  [id number]  MEDICAL RECORD NO.:  [id number]  LOCATION:  [id number]                        FACILITY:  [facility]  PHYSICIAN:  Beulah Gandy. Ashley Royalty, M.D. DATE OF BIRTH:  [birth year]  DATE OF PROCEDURE:  01/20/2013 DATE OF DISCHARGE:                              OPERATIVE REPORT   ADMISSION DIAGNOSIS:  Rhegmatogenous retinal detachment, right eye.  PROCEDURE:  Scleral buckle, right eye; retinal photocoagulation, right eye.  SURGEON:  Beulah Gandy. Ashley Royalty, M.D.  ASSISTANT:  Rosalie Doctor, SA.  ANESTHESIA:  General.  DETAILS:  Usual prep and drape, 360 degree limbal peritomy, isolation of 4 rectus muscles on 2-0 silk.  Scleral dissection from 5 o'clock around to 1 o'clock to admit a #279 intrascleral implant.  508 G radial segment was placed beneath the break at 9 o'clock.  Perforation was made at 9 o'clock, and a moderate amount of clear colorless subretinal fluid came forth in a controlled manner.  Two hundred forty band was placed against the globe along with the 508 G radial segment.  If the 240 band and 270 sleeve were placed, there was a belt loop at 3 o'clock, and a 270 sleeve at 2 o'clock.  The buckle was adjusted and trimmed.  The band was adjusted and trimmed.  Indirect ophthalmoscopy showed the retina to be lying nicely on the scleral buckle with no subretinal fluid remaining. The indirect ophthalmoscope laser was moved into place, 1079 burns were placed on the scleral buckle around the retinal break.  The power was between 400 and 600 mV 1000 microns each and 0.1 seconds each.  The buckle was trimmed.  The band was trimmed.  The conjunctiva was reposited with 7-0 chromic suture.  Polymyxin and gentamicin were irrigated into tenon space.  Atropine solution was applied.  Marcaine was injected around the globe for postop pain.  Closing pressure was 10 with a barrier care tonometer.  Polysporin ophthalmic ointment, a patch and shield were  placed.  The patient was awakened, taken to recovery in satisfactory condition.  COMPLICATIONS:  None.  Duration 2 hours.     Beulah Gandy. Ashley Royalty, M.D.     JDM/MEDQ  D:  01/20/2013  T:  01/21/2013  Job:  [id number]

## 2013-01-24 LAB — GLUCOSE, CAPILLARY

## 2013-01-27 ENCOUNTER — Inpatient Hospital Stay (INDEPENDENT_AMBULATORY_CARE_PROVIDER_SITE_OTHER): Payer: Medicare Other | Admitting: Ophthalmology

## 2013-01-27 DIAGNOSIS — H33009 Unspecified retinal detachment with retinal break, unspecified eye: Secondary | ICD-10-CM

## 2013-01-31 NOTE — Anesthesia Postprocedure Evaluation (Signed)
  Anesthesia Post-op Note  Patient: [name] [name]  Procedure(s) Performed: Procedure(s): SCLERAL BUCKLE (Right) LASER PHOTO ABLATION (Right)  Patient Location: PACU  Anesthesia Type:General  Level of Consciousness: awake  Airway and Oxygen Therapy: Patient Spontanous Breathing  Post-op Pain: mild  Post-op Assessment: Post-op Vital signs reviewed  Post-op Vital Signs: stable  Complications: No apparent anesthesia complications

## 2013-02-16 ENCOUNTER — Ambulatory Visit: Payer: Self-pay | Admitting: Neurology

## 2013-02-18 ENCOUNTER — Encounter (INDEPENDENT_AMBULATORY_CARE_PROVIDER_SITE_OTHER): Payer: Medicare Other | Admitting: Ophthalmology

## 2013-02-18 DIAGNOSIS — H33009 Unspecified retinal detachment with retinal break, unspecified eye: Secondary | ICD-10-CM

## 2013-04-04 ENCOUNTER — Encounter (INDEPENDENT_AMBULATORY_CARE_PROVIDER_SITE_OTHER): Payer: Medicare Other | Admitting: Ophthalmology

## 2013-04-08 ENCOUNTER — Ambulatory Visit: Payer: Medicare Other | Admitting: Neurology

## 2013-05-02 ENCOUNTER — Encounter (INDEPENDENT_AMBULATORY_CARE_PROVIDER_SITE_OTHER): Payer: Medicare Other | Admitting: Ophthalmology

## 2013-05-02 DIAGNOSIS — H35039 Hypertensive retinopathy, unspecified eye: Secondary | ICD-10-CM

## 2013-05-02 DIAGNOSIS — H33009 Unspecified retinal detachment with retinal break, unspecified eye: Secondary | ICD-10-CM

## 2013-05-02 DIAGNOSIS — E11319 Type 2 diabetes mellitus with unspecified diabetic retinopathy without macular edema: Secondary | ICD-10-CM

## 2013-05-02 DIAGNOSIS — H43819 Vitreous degeneration, unspecified eye: Secondary | ICD-10-CM

## 2013-05-02 DIAGNOSIS — E11359 Type 2 diabetes mellitus with proliferative diabetic retinopathy without macular edema: Secondary | ICD-10-CM

## 2013-05-02 DIAGNOSIS — I10 Essential (primary) hypertension: Secondary | ICD-10-CM

## 2013-05-02 DIAGNOSIS — E1139 Type 2 diabetes mellitus with other diabetic ophthalmic complication: Secondary | ICD-10-CM

## 2013-05-03 ENCOUNTER — Other Ambulatory Visit: Payer: Self-pay

## 2013-05-03 MED ORDER — QUETIAPINE FUMARATE 25 MG PO TABS: 25.0000 mg | ORAL_TABLET | Freq: Every day | ORAL | Status: DC

## 2013-05-04 ENCOUNTER — Telehealth: Payer: Self-pay | Admitting: Neurology

## 2013-05-06 NOTE — Telephone Encounter (Signed)
I spoke with [name] [name].  She said she is unsure if mom was confused about what she takes and when, but they do have the medication and she will continue to take it.

## 2013-05-09 ENCOUNTER — Encounter: Payer: Self-pay | Admitting: Neurology

## 2013-05-11 ENCOUNTER — Encounter: Payer: Self-pay | Admitting: Neurology

## 2013-05-11 ENCOUNTER — Ambulatory Visit (INDEPENDENT_AMBULATORY_CARE_PROVIDER_SITE_OTHER): Payer: Medicare Other | Admitting: Neurology

## 2013-05-11 VITALS — BP 174/76 | HR 76 | Resp 15 | Ht 62.0 in | Wt 186.5 lb

## 2013-05-11 DIAGNOSIS — F068 Other specified mental disorders due to known physiological condition: Secondary | ICD-10-CM

## 2013-05-11 DIAGNOSIS — W19XXXA Unspecified fall, initial encounter: Secondary | ICD-10-CM

## 2013-05-11 DIAGNOSIS — Y92009 Unspecified place in unspecified non-institutional (private) residence as the place of occurrence of the external cause: Secondary | ICD-10-CM

## 2013-05-11 DIAGNOSIS — H542X11 Low vision right eye category 1, low vision left eye category 1: Secondary | ICD-10-CM

## 2013-05-11 DIAGNOSIS — H543 Unqualified visual loss, both eyes: Secondary | ICD-10-CM

## 2013-05-11 DIAGNOSIS — F0390 Unspecified dementia without behavioral disturbance: Secondary | ICD-10-CM

## 2013-05-11 MED ORDER — DONEPEZIL HCL 5 MG PO TABS: 5.0000 mg | ORAL_TABLET | Freq: Every day | ORAL | Status: DC

## 2013-05-11 NOTE — Progress Notes (Signed)
[facility]  Provider:  Melvyn Novas, M D  Referring Provider: Darnelle Bos,* Primary Care Physician:  Darnelle Bos, MD  Chief Complaint  Patient presents with  . Follow-up    memory,rm 11    HPI:  [name] [name] is a 79 y.o. female  Is seen here as a referral/ revisit  from Dr. Earl Gala for follow up on memory loss.   [name] [name] has been an established patient since 2011 in a practice. She was initially referred here for mild memory loss and hallucinations.  The hallucinations began after a hip replacement in March 2011, after which  she moved to rehabilitation at [facility]. She had experienced a noticeable difference in her handwriting and a left facial droop but a CT scan was negative for stroke. In August 2013 she left [employer],  and then returned home and now again lives alone.  Her Mini-Mental Status Examination was 29 of 30 points or more, was reaching last time  a 30 points AFT was 10 points. Fall risk was 10 points in 2013 .  In December 2013 to return for follow up with another  MMSE., which documented 26/30 , followed by [facility] testing at  16/30 points.    I repeated the MOCA   at 13/30 points and I corrected some of the point counts 05/11/2013. Her fall risk is now increased to15 points, GDS (geriatric depression score) endorsed  at 5 points. The patient underwent another eye surgery on May 8th of this year. She has decreasing visual acuity , her  right eye is red, puffy  and burns. She has gotten confused when walking to the local bank, has been  getting lost on familiar roads and she is not longer permitted to drive.   The 2011  hallucinations were described as " a strong sense that a snake was in the room " ,that there were snakes in her house-  she also has heard a hissing  sounds and rales and feared that the snake was in her room.. She called the police feel that somebody would break into a home, 13 times-  and as she  recalled did not get a warm response. With  the beginning of treatment with Seroquel , her  hallucinations resolved. She still reports vivid and at  times crazy dreams.  There is no documented nocturnal activity for the last 12 months, but the patient also lives along and has no witnesses.  Review of Systems: Out of a complete 14 system review, the patient complains of only the following symptoms, and all other reviewed systems are negative.  vision loss, fall risk high, walks  on 4 pod cane. Memory loss, high degree of fatigue, vision is limited, burning pain in the right eye, she has gotten very pale.  History   Social History  . Marital Status: Divorced    Spouse Name: N/A    Number of Children: 2  . Years of Education: COLLEGE   Occupational History  .      retired Runner, broadcasting/film/video   Social History Main Topics  . Smoking status: Former Smoker -- 1.50 packs/day    Types: Cigarettes    Quit date: 09/16/1987  . Smokeless tobacco: Never Used  . Alcohol Use: No  . Drug Use: No  . Sexual Activity: No   Other Topics Concern  . Not on file   Social History Narrative  . No narrative on file    Family History  Problem  Relation Age of Onset  . Cancer Paternal Aunt   . Cancer Cousin     Past Medical History  Diagnosis Date  . Hypertension   . Depression   . CAD (coronary artery disease)   . GERD (gastroesophageal reflux disease)   . Bilateral carotid artery stenosis   . Diverticulitis   . Obesity   . Degenerative joint disease   . Hiatal hernia   . Dementia     early   . Atrial fibrillation with rapid ventricular response   . Hypothyroidism   . Type II diabetes mellitus   . Anemia   . Stroke 2011    "old very small TIA/neurologist"  . Chronic lower back pain     "back is very stiff because of fusion; can't stand up straight more than few moments"  . Bladder incontinence     Hx: of  . Complication of anesthesia     anesth. meds caused hallucinations   . Anxiety   .  Endometriosis   . Memory loss     Past Surgical History  Procedure Laterality Date  . Ptca  03/10/12    LLE  . Coronary artery bypass graft  04/17/2011    LT  internal mammary artery to left anterior descending, saphenous vein graft first diagonal, saphenous  vein graft to obtuse marginal 1, saphenous vein graft to posterior descending  . Cholecystectomy    . Posterior fusion lumbar spine      "removed L4-5, S1"  . Total knee arthroplasty  ~ 2004    right  . Partial hip arthroplasty  11/2009    left; "just the ball"  . Cataract extraction w/ intraocular lens  implant, bilateral    . Neuroplasty / transposition median nerve at carpal tunnel bilateral    . Hand surgery      "for arthritis; right hand"  . Joint replacement    . Dilation and curettage of uterus      "for endometrosis"  . Scleral buckle Right 01/20/2013    Procedure: SCLERAL BUCKLE;  Surgeon: Sherrie George, MD;  Location: [facility] OR;  Service: Ophthalmology;  Laterality: Right;  . Laser photo ablation Right 01/20/2013    Procedure: LASER PHOTO ABLATION;  Surgeon: Sherrie George, MD;  Location: [facility] OR;  Service: Ophthalmology;  Laterality: Right;  . Back surgery      X2  . Carpal tunnel release Bilateral   . Dilation and curettage of uterus      ENDOMETRIOSIS    Current Outpatient Prescriptions  Medication Sig Dispense Refill  . bacitracin-polymyxin b (POLYSPORIN) ophthalmic ointment Place 1 application into the right eye 4 (four) times daily. apply to eye every 12 hours while awake  3.5 g    . bimatoprost (LUMIGAN) 0.03 % ophthalmic solution Place 1 drop into both eyes at bedtime.        . cholecalciferol (VITAMIN D) 1000 UNITS tablet Take 1,000 Units by mouth See admin instructions. On Monday, Tuesday, Wednesday, Friday, Saturday.      . ergocalciferol (VITAMIN D2) 50000 UNITS capsule Take 50,000 Units by mouth 2 (two) times a week. On Thursday and Sunday      . felodipine (PLENDIL) 5 MG 24 hr tablet Take 5 mg by mouth  every morning.       [name] FLUoxetine (PROZAC) 20 MG capsule Take 20 mg by mouth every morning.       [name] glipiZIDE (GLUCOTROL XL) 5 MG 24 hr tablet Take 5 mg by mouth  at bedtime.       . Insulin Glargine (LANTUS SOLOSTAR) 100 UNIT/ML SOPN Inject 10 Units into the skin at bedtime.      [name] levothyroxine (SYNTHROID, LEVOTHROID) 125 MCG tablet Take 125 mcg by mouth daily before breakfast.       . memantine (NAMENDA) 10 MG tablet Take 10 mg by mouth every morning.      . metFORMIN (GLUCOPHAGE) 500 MG tablet Take 1 tablet (500 mg total) by mouth 2 (two) times daily with a meal.      . metoprolol tartrate (LOPRESSOR) 25 MG tablet Take 25 mg by mouth 2 (two) times daily.        [name] omeprazole (PRILOSEC) 20 MG capsule Take 20 mg by mouth daily.        . pravastatin (PRAVACHOL) 40 MG tablet Take 40 mg by mouth every evening.       [name] QUEtiapine (SEROQUEL) 25 MG tablet Take 1 tablet (25 mg total) by mouth at bedtime. 1 hour prior to bedtime, may take a second tablet at night if needed Max 2 tabs nightly  60 tablet  0  . traMADol (ULTRAM) 50 MG tablet Take 50 mg by mouth every 6 (six) hours as needed for pain. For pain      . ascorbic acid (VITAMIN C) 1000 MG tablet Take 1,000 mg by mouth every Monday, Wednesday, and Friday.       . hydrOXYzine (ATARAX/VISTARIL) 25 MG tablet Take 25 mg by mouth every 6 (six) hours as needed for itching.      . mirabegron ER (MYRBETRIQ) 50 MG TB24 Take 50 mg by mouth every evening.      . pioglitazone (ACTOS) 30 MG tablet Take 30 mg by mouth every morning.       . [DISCONTINUED] amiodarone (PACERONE) 200 MG tablet Take 200 mg by mouth 3 (three) times a week.        No current facility-administered medications for this visit.    Allergies as of 05/11/2013 - Review Complete 05/11/2013  Allergen Reaction Noted  . Vicodin [hydrocodone-acetaminophen] Other (See Comments) 05/09/2011  . Vicodin [hydrocodone-acetaminophen]  05/09/2013  . Polysaccharide iron complex Hives, Itching, and  Rash 05/12/2011    Vitals: BP 174/76  Pulse 76  Resp 15  Ht 5\' 2"  (1.575 m)  Wt 186 lb 8 oz (84.596 kg)  BMI 34.1 kg/m2 Last Weight:  Wt Readings from Last 1 Encounters:  05/11/13 186 lb 8 oz (84.596 kg)   Last Height:   Ht Readings from Last 1 Encounters:  05/11/13 5\' 2"  (1.575 m)    Physical exam:  General: The patient is awake, alert and appears not in acute distress. The patient is well groomed. Head: Normocephalic, atraumatic. Neck is supple. Cardiovascular:  Regular rate and rhythm without carotid bruit, and without distended neck veins. Respiratory: Lungs are clear to auscultation. Skin:  Without evidence of edema, or rash Trunk: BMI is elevated . The  patient  has  fallen in June 2014, increased fall risk score to 15 points.  Neurologic exam : The patient is awake and alert, oriented to place and time.  Memory subjective  described as declining , MOCA 13 points. An MMSE was not tested.   There is no longer a  normal attention span & concentration ability. Speech is fluent without dysarthria, dysphonia or aphasia. Mood and affect are appropriate. The patient was not longer aware that she had gotten lost several times. The daughter reminded her that these events  truly took place.  Cranial nerves: Pupils are equal and briskly reactive to light. Her right eye is surrounded by reddish swollen Tissue and ciliary injection.   Extraocular movements  in vertical and horizontal planes intact and without nystagmus. Visual fields by finger perimetry are intact. Hearing to finger rub intact.  Facial sensation intact to fine touch. Facial motor strength is symmetric - there is a hint of left facial droop - and tongue and uvula move midline.  Motor exam:   Normal tone and normal muscle bulk and symmetric normal strength in upper extremities.  Sensory:  Fine touch, pinprick and vibration were tested in all extremities normal.  Coordination: Rapid alternating movements in the  fingers/hands is tested and normal.  Finger-to-nose maneuver tested and normal without evidence of ataxia, dysmetria or tremor.   Gait and station: Patient walks with assistive device. She is  very slow and  leans to the left.   Tandem gait  Is not possible, toe and heel walk are deferred.   Deep tendon reflexes: in the  upper and lower extremities are symmetric and intact. Babinski maneuver response is  downgoing.   Assessment:  Advancing memory los, inability to multitask, delay in motor responses and poor vision.  The patient is not allowed to drive any longer.  Plan:  Treatment plan and additional workup : DEMENTIA rather than mild cognitive impairment.   Dicussed driving restrictions - she should not even drive shopping orin her residential area.  Date this order is multifactorial partially related to her back surgeries, to  her knee replacement  and a partial hip replacement.  Partially gait is affected also by her lack of visual acuity and she needs an assistive device.  She has more neck shoulder and deltoid/ trapezius area pain recently,  and has seen her orthopedist for an evaluation of joint pain. . She has no history of bradycardia, syncope. She did have orthostatic dizziness at times. Would like her to stay on the Namenda X. once a day, in addition to 5 mg of Aricept. Home safety evaluation for this patient will be ordered with either [facility] or [facility], etc. There is reasonable concern , that living alone may not longer be a safe option for her. The PT order is meant to address fall risk and gait stabilisation.   My Nurse, Mickey Farber , is aware of the urgency and  working on this referral .

## 2013-05-11 NOTE — Patient Instructions (Signed)
Driving and Equipment Restrictions Some medical problems make it dangerous to drive, ride a bike, or use machines. Some of these problems are:  A hard blow to the head (concussion).  Passing out (fainting).  Twitching and shaking (seizures).  Low blood sugar.  Taking medicine to help you relax (sedatives).  Taking pain medicines.  Wearing an eye patch.  Wearing splints. This can make it hard to use parts of your body that you need to drive safely. HOME CARE   Do not drive until your doctor says it is okay.  Do not use machines until your doctor says it is okay. You may need a form signed by your doctor (medical release) before you can drive again. You may also need this form before you do other tasks where you need to be fully alert. MAKE SURE YOU:  Understand these instructions.  Will watch your condition.  Will get help right away if you are not doing well or get worse. Document Released: 10/09/2004 Document Revised: Dec 10, 202013 Document Reviewed: 01/09/2010 [facility] Patient Information 2014 [place], [place]. Fall Prevention and Home Safety Falls cause injuries and can affect all age groups. It is possible to prevent falls.  HOW TO PREVENT FALLS  Wear shoes with rubber soles that do not have an opening for your toes.  Keep the inside and outside of your house well lit.  Use night lights throughout your home.  Remove clutter from floors.  Clean up floor spills.  Remove throw rugs or fasten them to the floor with carpet tape.  Do not place electrical cords across pathways.  Put grab bars by your tub, shower, and toilet. Do not use towel bars as grab bars.  Put handrails on both sides of the stairway. Fix loose handrails.  Do not climb on stools or stepladders, if possible.  Do not wax your floors.  Repair uneven or unsafe sidewalks, walkways, or stairs.  Keep items you use a lot within reach.  Be aware of pets.  Keep emergency numbers next to the  telephone.  Put smoke detectors in your home and near bedrooms. Ask your doctor what other things you can do to prevent falls. Document Released: 06/28/2009 Document Revised: 03/02/2012 Document Reviewed: 12/02/2011 [facility] Patient Information 2014 [place], [place].

## 2013-05-12 ENCOUNTER — Telehealth: Payer: Self-pay | Admitting: Neurology

## 2013-05-15 ENCOUNTER — Encounter: Payer: Self-pay | Admitting: Neurology

## 2013-05-15 DIAGNOSIS — H542X11 Low vision right eye category 1, low vision left eye category 1: Secondary | ICD-10-CM

## 2013-05-15 DIAGNOSIS — F0390 Unspecified dementia without behavioral disturbance: Secondary | ICD-10-CM

## 2013-05-15 DIAGNOSIS — Y92009 Unspecified place in unspecified non-institutional (private) residence as the place of occurrence of the external cause: Secondary | ICD-10-CM | POA: Insufficient documentation

## 2013-05-15 DIAGNOSIS — E669 Obesity, unspecified: Secondary | ICD-10-CM | POA: Insufficient documentation

## 2013-05-15 DIAGNOSIS — W19XXXA Unspecified fall, initial encounter: Secondary | ICD-10-CM

## 2013-05-15 HISTORY — DX: Low vision right eye category 1, low vision left eye category 1: H54.2X11

## 2013-05-15 HISTORY — DX: Unspecified place in unspecified non-institutional (private) residence as the place of occurrence of the external cause: W19.XXXA

## 2013-05-15 HISTORY — DX: Obesity, unspecified: E66.9

## 2013-05-15 HISTORY — DX: Unspecified dementia, unspecified severity, without behavioral disturbance, psychotic disturbance, mood disturbance, and anxiety: F03.90

## 2013-05-15 HISTORY — DX: Unspecified place in unspecified non-institutional (private) residence as the place of occurrence of the external cause: Y92.009

## 2013-05-18 NOTE — Telephone Encounter (Signed)
Spoke with pt and informed her that Dr Vickey Huger has referred her for In-Home care since her last visit.  I am faxing Dr Oliva Bustard notes to Dr Earl Gala (advised her to see her neurologist) at [facility] so that he may be updated on her current care.  The pt was accepting of these steps and had no further questions or concerns.

## 2013-05-20 ENCOUNTER — Telehealth: Payer: Self-pay | Admitting: Neurology

## 2013-05-20 NOTE — Telephone Encounter (Signed)
I spoke with the PT from [facility] who said the patient was confused if she was suppose to get PT with [facility] or as an outpatient.  I told her the patient definitely needed home health, she is not to drive.  I then called patient and told her the doctor wanted her to have PT at home.  She understood and then went on about another problem she was having.  Said she is having bowel incontinence and contacted her PCP, Dr. Earl Gala, about this issue.  He told her it may be from nerve damage from previous surgery and she should talk to her neurologist about the problem.  She forgot to mention it at her visit and would like to know if anything can be done for this problem.  Please advise.  [phone]

## 2013-05-23 NOTE — Telephone Encounter (Signed)
patient is not answering her phone. Ethan Kasperski, MD

## 2013-05-24 ENCOUNTER — Other Ambulatory Visit: Payer: Self-pay | Admitting: Neurology

## 2013-05-24 ENCOUNTER — Telehealth: Payer: Self-pay | Admitting: Neurology

## 2013-05-24 DIAGNOSIS — H543 Unqualified visual loss, both eyes: Secondary | ICD-10-CM

## 2013-05-24 DIAGNOSIS — F0390 Unspecified dementia without behavioral disturbance: Secondary | ICD-10-CM

## 2013-05-24 DIAGNOSIS — F068 Other specified mental disorders due to known physiological condition: Secondary | ICD-10-CM

## 2013-05-24 NOTE — Telephone Encounter (Signed)
Jae Dire the PT from [facility] left a message to have PT continue for the patient 2 times a week for the next 3 weeks.  She also requested orders for ST, OT and social work evaluation for safety issue.  I spoke to the doctor who agreed and I will enter orders.  I have left a message for Jae Dire and asked for a fax number so I can fax over the orders.

## 2013-05-25 ENCOUNTER — Telehealth: Payer: Self-pay | Admitting: Neurology

## 2013-05-25 NOTE — Telephone Encounter (Signed)
The OT with [facility] called to give an update on his evaluation of the patient.  He will see the patient one time a week for 2 weeks for follow up on functional mobility, transfers for safety and fall prevention, energy conservation and home management tasks.  We don't need to respond, just wanted to give office the information.

## 2013-05-27 ENCOUNTER — Other Ambulatory Visit: Payer: Self-pay | Admitting: Neurology

## 2013-05-30 ENCOUNTER — Telehealth: Payer: Self-pay | Admitting: Neurology

## 2013-06-02 ENCOUNTER — Other Ambulatory Visit: Payer: Self-pay | Admitting: Neurology

## 2013-06-06 ENCOUNTER — Ambulatory Visit
Admission: RE | Admit: 2013-06-06 | Discharge: 2013-06-06 | Disposition: A | Discharge status: Home / Self Care | Payer: Medicare Other | Source: Ambulatory Visit | Attending: Neurology | Admitting: Neurology

## 2013-06-06 DIAGNOSIS — F068 Other specified mental disorders due to known physiological condition: Secondary | ICD-10-CM

## 2013-06-06 DIAGNOSIS — H543 Unqualified visual loss, both eyes: Secondary | ICD-10-CM

## 2013-06-06 DIAGNOSIS — F0390 Unspecified dementia without behavioral disturbance: Secondary | ICD-10-CM

## 2013-06-07 ENCOUNTER — Telehealth: Payer: Self-pay | Admitting: Neurology

## 2013-06-07 DIAGNOSIS — W19XXXA Unspecified fall, initial encounter: Secondary | ICD-10-CM

## 2013-06-07 DIAGNOSIS — H543 Unqualified visual loss, both eyes: Secondary | ICD-10-CM

## 2013-06-07 DIAGNOSIS — F068 Other specified mental disorders due to known physiological condition: Secondary | ICD-10-CM

## 2013-06-08 ENCOUNTER — Ambulatory Visit: Payer: Medicare Other | Admitting: Neurology

## 2013-06-09 ENCOUNTER — Telehealth: Payer: Self-pay | Admitting: Neurology

## 2013-06-09 NOTE — Telephone Encounter (Addendum)
Called and spoke to [place] with [facility] re: rollator and extend of PT.  Verbal order for PT given, rollator order already done will fax as per previous message.

## 2013-06-09 NOTE — Telephone Encounter (Signed)
Order placed for rollator  From [facility].

## 2013-06-13 ENCOUNTER — Telehealth: Payer: Self-pay | Admitting: *Deleted

## 2013-06-13 NOTE — Telephone Encounter (Signed)
[phone]. I want to follow-up on brain scan results. I need an option from Dr. Vickey Huger for a possible surgery. Please call and advise

## 2013-06-17 ENCOUNTER — Telehealth: Payer: Self-pay | Admitting: Neurology

## 2013-06-17 NOTE — Telephone Encounter (Signed)
The message from 06-17-13 has been forwarded to doctor.

## 2013-06-17 NOTE — Telephone Encounter (Signed)
The patient's daughter has called twice and would like to speak to doctor about patient's CT scan and surgery.  She can be reached at [phone], Laural Roes.  I left message for daughter to get more details but will forward to doctor.

## 2013-06-20 NOTE — Telephone Encounter (Signed)
CT unchanged since 2011,  Some brain atrophy, no stroke and no tumor.  I called her daughter with these results. She is scheduled for shoulder surgery, but her orthopedist and cardiologist have some reservation.  Her cardiac stress test was normal.  With dementia , there is always a decline with every prolonged anesthesia . Frozen shoulder is painful, perhaps conservative ways to treat.

## 2013-06-21 ENCOUNTER — Encounter: Payer: Self-pay | Admitting: Neurology

## 2013-08-19 ENCOUNTER — Telehealth: Payer: Self-pay | Admitting: Neurology

## 2013-09-26 ENCOUNTER — Other Ambulatory Visit: Payer: Self-pay | Admitting: Orthopedic Surgery

## 2013-09-27 ENCOUNTER — Encounter (HOSPITAL_COMMUNITY)
Admission: RE | Admit: 2013-09-27 | Discharge: 2013-09-27 | Disposition: A | Discharge status: Home / Self Care | Payer: Medicare Other | Source: Ambulatory Visit | Attending: Orthopedic Surgery | Admitting: Orthopedic Surgery

## 2013-09-27 ENCOUNTER — Encounter (HOSPITAL_COMMUNITY): Payer: Self-pay

## 2013-09-27 DIAGNOSIS — Z01811 Encounter for preprocedural respiratory examination: Secondary | ICD-10-CM | POA: Insufficient documentation

## 2013-09-27 DIAGNOSIS — Z01812 Encounter for preprocedural laboratory examination: Secondary | ICD-10-CM | POA: Insufficient documentation

## 2013-09-27 DIAGNOSIS — Z01810 Encounter for preprocedural cardiovascular examination: Secondary | ICD-10-CM | POA: Insufficient documentation

## 2013-09-27 DIAGNOSIS — Z01818 Encounter for other preprocedural examination: Secondary | ICD-10-CM | POA: Insufficient documentation

## 2013-09-27 HISTORY — DX: Full incontinence of feces: R15.9

## 2013-09-27 LAB — COMPREHENSIVE METABOLIC PANEL
ALBUMIN: 3.7 g/dL (ref 3.5–5.2)
ALK PHOS: 91 U/L (ref 39–117)
ALT: 11 U/L (ref 0–35)
AST: 15 U/L (ref 0–37)
BUN: 33 mg/dL — AB (ref 6–23)
CALCIUM: 9.5 mg/dL (ref 8.4–10.5)
CO2: 26 mEq/L (ref 19–32)
Chloride: 105 mEq/L (ref 96–112)
Creatinine, Ser: 1.01 mg/dL (ref 0.50–1.10)
GFR calc Af Amer: 61 mL/min — ABNORMAL LOW (ref >90)
GFR calc non Af Amer: 52 mL/min — ABNORMAL LOW (ref >90)
Glucose, Bld: 108 mg/dL — ABNORMAL HIGH (ref 70–99)
POTASSIUM: 4.6 meq/L (ref 3.7–5.3)
SODIUM: 143 meq/L (ref 137–147)
TOTAL PROTEIN: 7.3 g/dL (ref 6.0–8.3)
Total Bilirubin: 0.3 mg/dL (ref 0.3–1.2)

## 2013-09-27 LAB — CBC WITH DIFFERENTIAL/PLATELET
BASOS PCT: 0 % (ref 0–1)
Basophils Absolute: 0 10*3/uL (ref 0.0–0.1)
EOS ABS: 0.3 10*3/uL (ref 0.0–0.7)
EOS PCT: 4 % (ref 0–5)
HCT: 40.1 % (ref 36.0–46.0)
Hemoglobin: 13.5 g/dL (ref 12.0–15.0)
LYMPHS ABS: 1.5 10*3/uL (ref 0.7–4.0)
Lymphocytes Relative: 19 % (ref 12–46)
MCH: 33.3 pg (ref 26.0–34.0)
MCHC: 33.7 g/dL (ref 30.0–36.0)
MCV: 98.8 fL (ref 78.0–100.0)
MONOS PCT: 10 % (ref 3–12)
Monocytes Absolute: 0.8 10*3/uL (ref 0.1–1.0)
Neutro Abs: 5.1 10*3/uL (ref 1.7–7.7)
Neutrophils Relative %: 67 % (ref 43–77)
PLATELETS: 221 10*3/uL (ref 150–400)
RBC: 4.06 MIL/uL (ref 3.87–5.11)
RDW: 13.2 % (ref 11.5–15.5)
WBC: 7.7 10*3/uL (ref 4.0–10.5)

## 2013-09-27 LAB — URINALYSIS, ROUTINE W REFLEX MICROSCOPIC
Bilirubin Urine: NEGATIVE
Glucose, UA: NEGATIVE mg/dL
Hgb urine dipstick: NEGATIVE
Ketones, ur: NEGATIVE mg/dL
Leukocytes, UA: NEGATIVE
NITRITE: NEGATIVE
PROTEIN: 30 mg/dL — AB
SPECIFIC GRAVITY, URINE: 1.025 (ref 1.005–1.030)
Urobilinogen, UA: 0.2 mg/dL (ref 0.0–1.0)
pH: 5 (ref 5.0–8.0)

## 2013-09-27 LAB — URINE MICROSCOPIC-ADD ON

## 2013-09-27 LAB — PROTIME-INR
INR: 1.01 (ref 0.00–1.49)
Prothrombin Time: 13.1 seconds (ref 11.6–15.2)

## 2013-09-27 LAB — APTT: aPTT: 29 seconds (ref 24–37)

## 2013-09-27 MED ORDER — CHLORHEXIDINE GLUCONATE 4 % EX LIQD: 60.0000 mL | Freq: Once | CUTANEOUS | Status: DC

## 2013-09-27 NOTE — Pre-Procedure Instructions (Signed)
[name] [name]  09/27/2013   Your procedure is scheduled on:  October 07, 2013  Report to [facility] (Entrance A) at 8:20 AM.  Call this number if you have problems the morning of surgery: [phone]   Remember:   Do not eat food or drink liquids after midnight.   Take these medicines the morning of surgery with A SIP OF WATER: felodipine (PLENDIL), FLUoxetine (PROZAC), hydrOXYzine (ATARAX/VISTARIL) as needed for itching,  levothyroxine (SYNTHROID, LEVOTHROID),  metoprolol tartrate (LOPRESSOR),  NAMENDA , omeprazole (PRILOSEC) , traMADol (ULTRAM) as needed for pain    Do not wear jewelry, make-up or nail polish.  Do not wear lotions, powders, or perfumes. You may NOT wear deodorant.  Do not shave 48 hours prior to surgery. Men may shave face and neck.  Do not bring valuables to the hospital.  [facility] is not responsible   for any belongings or valuables.               Contacts, dentures or bridgework may not be worn into surgery.  Leave suitcase in the car. After surgery it may be brought to your room.  For patients admitted to the hospital, discharge time is determined by your treatment team.               Patients discharged the day of surgery will not be allowed to drive home.  Name and phone number of your driver: family/friend  Special Instructions: Shower using CHG 2 nights before surgery and the night before surgery.  If you shower the day of surgery use CHG.  Use special wash - you have one bottle of CHG for all showers.  You should use approximately 1/3 of the bottle for each shower.   Please read over the following fact sheets that you were given: Pain Booklet, Coughing and Deep Breathing, Blood Transfusion Information and Surgical Site Infection Prevention

## 2013-09-28 ENCOUNTER — Telehealth: Payer: Self-pay | Admitting: Neurology

## 2013-09-28 NOTE — Progress Notes (Signed)
Anesthesia Chart Review:  Patient is a 80 year old female scheduled for right shoulder replacement on 10/07/13 by Dr. Berenice Primas.    History includes former smoker, obesity, hallucinations after anesthesia, CAD s/p CABG (LIMA to LAD, SVG to D1, SVG to OM1, SVG to PDA) 04/17/11, afib, DM2, dementia, CVA, anxiety, depression, anemia, hypothyroidism, hiatal hernia, carotid artery stenosis (79-89% RICAS, 21-19% LICAS 41/74/08), PAD s/p PTA to distal left SFA 02/2012, diverticulitis, right TKA, PLIF, left partial THA, right eye scleral buckle 01/2013. PCP is Dr. Nehemiah Settle who referred patient back to cardiologist Dr. Casandra Doffing for cardiac clearance.  In September 2014, she was cleared following an unremarkable stress test.  Neurologist is Dr. Asencion Partridge Dohmeier.  Dr. Brett Fairy is aware of plans for surgery and spoke with patient's daughter regarding possibility of decline with dementia with prolonged anesthesia.  EKG on 09/27/13 showed SR with sinus arrhythmia with first degree AVB, LVH with repolarization abnormality (lateral T wave inversion), cannot rule out septal infarct (age undetermined.) Overall, I think her EKG is stable since 03/10/12.  Nuclear stress test on 06/02/13 [facility]) showed: Normal myocardial perfusion scan demonstrating an attenuation artifact in the anterior region of the myocardium.  No ischemia/scar is seen in the remaining myocardium.  Post-stress EF 61%.  Pre-CABG cardiac cath on 03/13/11 showed: 1. Three-vessel coronary artery disease.  2. Preserved left ventricular function with minimal anterior wall hypokinesis.  CXR on 09/27/13 showed: 1. There are mild changes of COPD and pulmonary fibrosis. There is no evidence of pneumonia. 2. There is no evidence of CHF or pleural effusion.   Preoperative labs noted. She is for a T&S on the day of surgery.  She will be evaluated by her assigned anesthesiologist on the day of surgery, but if no acute changes then I would anticipate that  she could proceed as planned.  George Hugh [facility]/Anesthesiology Phone [phone] 09/28/2013 5:57 PM

## 2013-09-28 NOTE — Telephone Encounter (Signed)
NEEDS RX [place] [address]

## 2013-09-29 MED ORDER — QUETIAPINE FUMARATE 25 MG PO TABS: 25.0000 mg | ORAL_TABLET | Freq: Every day | ORAL | Status: DC

## 2013-10-06 MED ORDER — CEFAZOLIN SODIUM-DEXTROSE 2-3 GM-% IV SOLR
2.0000 g | INTRAVENOUS | Status: AC
  Administered 2013-10-07: 2 g via INTRAVENOUS
  Filled 2013-10-06: qty 50

## 2013-10-06 MED ORDER — CHLORHEXIDINE GLUCONATE 4 % EX LIQD: 1.0000 "application " | Freq: Once | CUTANEOUS | Status: DC

## 2013-10-07 ENCOUNTER — Inpatient Hospital Stay (HOSPITAL_COMMUNITY): Payer: Medicare Other | Admitting: Anesthesiology

## 2013-10-07 ENCOUNTER — Encounter (HOSPITAL_COMMUNITY)
Admission: RE | Disposition: A | Discharge status: Skilled Nursing Facility | Payer: Self-pay | Source: Ambulatory Visit | Attending: Orthopedic Surgery

## 2013-10-07 ENCOUNTER — Encounter (HOSPITAL_COMMUNITY): Payer: Medicare Other | Admitting: Vascular Surgery

## 2013-10-07 ENCOUNTER — Inpatient Hospital Stay (HOSPITAL_COMMUNITY)
Admission: RE | Admit: 2013-10-07 | Discharge: 2013-10-10 | DRG: 483 | Disposition: A | Discharge status: Skilled Nursing Facility | Payer: Medicare Other | Source: Ambulatory Visit | Attending: Orthopedic Surgery | Admitting: Orthopedic Surgery

## 2013-10-07 ENCOUNTER — Inpatient Hospital Stay (HOSPITAL_COMMUNITY): Payer: Medicare Other

## 2013-10-07 ENCOUNTER — Encounter (HOSPITAL_COMMUNITY): Payer: Self-pay | Admitting: *Deleted

## 2013-10-07 DIAGNOSIS — I4891 Unspecified atrial fibrillation: Secondary | ICD-10-CM | POA: Diagnosis present

## 2013-10-07 DIAGNOSIS — Z79899 Other long term (current) drug therapy: Secondary | ICD-10-CM

## 2013-10-07 DIAGNOSIS — F329 Major depressive disorder, single episode, unspecified: Secondary | ICD-10-CM | POA: Diagnosis present

## 2013-10-07 DIAGNOSIS — K219 Gastro-esophageal reflux disease without esophagitis: Secondary | ICD-10-CM | POA: Diagnosis present

## 2013-10-07 DIAGNOSIS — Z9089 Acquired absence of other organs: Secondary | ICD-10-CM

## 2013-10-07 DIAGNOSIS — I110 Hypertensive heart disease with heart failure: Secondary | ICD-10-CM | POA: Diagnosis present

## 2013-10-07 DIAGNOSIS — I252 Old myocardial infarction: Secondary | ICD-10-CM

## 2013-10-07 DIAGNOSIS — Z23 Encounter for immunization: Secondary | ICD-10-CM

## 2013-10-07 DIAGNOSIS — E039 Hypothyroidism, unspecified: Secondary | ICD-10-CM | POA: Diagnosis present

## 2013-10-07 DIAGNOSIS — M545 Low back pain, unspecified: Secondary | ICD-10-CM | POA: Diagnosis present

## 2013-10-07 DIAGNOSIS — Z6833 Body mass index (BMI) 33.0-33.9, adult: Secondary | ICD-10-CM

## 2013-10-07 DIAGNOSIS — I2510 Atherosclerotic heart disease of native coronary artery without angina pectoris: Secondary | ICD-10-CM | POA: Diagnosis present

## 2013-10-07 DIAGNOSIS — Z8673 Personal history of transient ischemic attack (TIA), and cerebral infarction without residual deficits: Secondary | ICD-10-CM

## 2013-10-07 DIAGNOSIS — E119 Type 2 diabetes mellitus without complications: Secondary | ICD-10-CM | POA: Diagnosis present

## 2013-10-07 DIAGNOSIS — Z9861 Coronary angioplasty status: Secondary | ICD-10-CM

## 2013-10-07 DIAGNOSIS — Z9849 Cataract extraction status, unspecified eye: Secondary | ICD-10-CM

## 2013-10-07 DIAGNOSIS — E669 Obesity, unspecified: Secondary | ICD-10-CM | POA: Diagnosis present

## 2013-10-07 DIAGNOSIS — I10 Essential (primary) hypertension: Secondary | ICD-10-CM | POA: Diagnosis present

## 2013-10-07 DIAGNOSIS — Z981 Arthrodesis status: Secondary | ICD-10-CM

## 2013-10-07 DIAGNOSIS — Z888 Allergy status to other drugs, medicaments and biological substances status: Secondary | ICD-10-CM

## 2013-10-07 DIAGNOSIS — F411 Generalized anxiety disorder: Secondary | ICD-10-CM | POA: Diagnosis present

## 2013-10-07 DIAGNOSIS — Z87891 Personal history of nicotine dependence: Secondary | ICD-10-CM

## 2013-10-07 DIAGNOSIS — Z96649 Presence of unspecified artificial hip joint: Secondary | ICD-10-CM

## 2013-10-07 DIAGNOSIS — M19011 Primary osteoarthritis, right shoulder: Secondary | ICD-10-CM | POA: Diagnosis present

## 2013-10-07 DIAGNOSIS — F0390 Unspecified dementia without behavioral disturbance: Secondary | ICD-10-CM | POA: Diagnosis present

## 2013-10-07 DIAGNOSIS — Z96659 Presence of unspecified artificial knee joint: Secondary | ICD-10-CM

## 2013-10-07 DIAGNOSIS — Z961 Presence of intraocular lens: Secondary | ICD-10-CM

## 2013-10-07 DIAGNOSIS — G8929 Other chronic pain: Secondary | ICD-10-CM | POA: Diagnosis present

## 2013-10-07 DIAGNOSIS — M1990 Unspecified osteoarthritis, unspecified site: Principal | ICD-10-CM | POA: Diagnosis present

## 2013-10-07 DIAGNOSIS — Z7902 Long term (current) use of antithrombotics/antiplatelets: Secondary | ICD-10-CM

## 2013-10-07 DIAGNOSIS — IMO0001 Reserved for inherently not codable concepts without codable children: Secondary | ICD-10-CM

## 2013-10-07 DIAGNOSIS — F3289 Other specified depressive episodes: Secondary | ICD-10-CM | POA: Diagnosis present

## 2013-10-07 DIAGNOSIS — Z794 Long term (current) use of insulin: Secondary | ICD-10-CM

## 2013-10-07 HISTORY — DX: Primary osteoarthritis, right shoulder: M19.011

## 2013-10-07 HISTORY — PX: TOTAL SHOULDER ARTHROPLASTY: SHX126

## 2013-10-07 LAB — GLUCOSE, CAPILLARY
Glucose-Capillary: 75 mg/dL (ref 70–99)
Glucose-Capillary: 86 mg/dL (ref 70–99)
Glucose-Capillary: 87 mg/dL (ref 70–99)
Glucose-Capillary: 176 mg/dL — ABNORMAL HIGH (ref 70–99)

## 2013-10-07 LAB — TYPE AND SCREEN
ABO/RH(D): O POS
Antibody Screen: NEGATIVE

## 2013-10-07 SURGERY — ARTHROPLASTY, SHOULDER, TOTAL
Anesthesia: Regional | Site: Shoulder | Laterality: Right

## 2013-10-07 MED ORDER — ROCURONIUM BROMIDE 50 MG/5ML IV SOLN
INTRAVENOUS | Status: AC
  Filled 2013-10-07: qty 1

## 2013-10-07 MED ORDER — INSULIN GLARGINE 100 UNIT/ML ~~LOC~~ SOLN
5.0000 [IU] | Freq: Every day | SUBCUTANEOUS | Status: DC
  Administered 2013-10-07 – 2013-10-09 (×3): 5 [IU] via SUBCUTANEOUS
  Filled 2013-10-07 (×4): qty 0.05

## 2013-10-07 MED ORDER — FENTANYL CITRATE 0.05 MG/ML IJ SOLN
INTRAMUSCULAR | Status: AC
  Filled 2013-10-07: qty 5

## 2013-10-07 MED ORDER — BISACODYL 5 MG PO TBEC: 5.0000 mg | DELAYED_RELEASE_TABLET | Freq: Every day | ORAL | Status: DC | PRN

## 2013-10-07 MED ORDER — INSULIN ASPART 100 UNIT/ML ~~LOC~~ SOLN
0.0000 [IU] | Freq: Three times a day (TID) | SUBCUTANEOUS | Status: DC
  Administered 2013-10-08: 2 [IU] via SUBCUTANEOUS
  Administered 2013-10-08: 3 [IU] via SUBCUTANEOUS
  Administered 2013-10-09: 2 [IU] via SUBCUTANEOUS
  Administered 2013-10-09 (×2): 3 [IU] via SUBCUTANEOUS
  Administered 2013-10-10: 2 [IU] via SUBCUTANEOUS

## 2013-10-07 MED ORDER — FELODIPINE ER 5 MG PO TB24
5.0000 mg | ORAL_TABLET | Freq: Every morning | ORAL | Status: DC
  Administered 2013-10-08 – 2013-10-10 (×3): 5 mg via ORAL
  Filled 2013-10-07 (×3): qty 1

## 2013-10-07 MED ORDER — METOPROLOL TARTRATE 25 MG PO TABS
25.0000 mg | ORAL_TABLET | Freq: Two times a day (BID) | ORAL | Status: DC
  Administered 2013-10-07 – 2013-10-10 (×6): 25 mg via ORAL
  Filled 2013-10-07 (×7): qty 1

## 2013-10-07 MED ORDER — ACETAMINOPHEN 650 MG RE SUPP: 650.0000 mg | Freq: Four times a day (QID) | RECTAL | Status: DC | PRN

## 2013-10-07 MED ORDER — PHENOL 1.4 % MT LIQD: 1.0000 | OROMUCOSAL | Status: DC | PRN

## 2013-10-07 MED ORDER — DEXTROSE 5 % IV SOLN
500.0000 mg | Freq: Four times a day (QID) | INTRAVENOUS | Status: DC | PRN
  Filled 2013-10-07: qty 5

## 2013-10-07 MED ORDER — FENTANYL CITRATE 0.05 MG/ML IJ SOLN
INTRAMUSCULAR | Status: DC | PRN
Start: 2013-10-07 — End: 2013-10-07
  Administered 2013-10-07: 100 ug via INTRAVENOUS

## 2013-10-07 MED ORDER — LACTATED RINGERS IV SOLN
INTRAVENOUS | Status: DC
Start: 2013-10-07 — End: 2013-10-07
  Administered 2013-10-07: 09:00:00 via INTRAVENOUS

## 2013-10-07 MED ORDER — ALUM & MAG HYDROXIDE-SIMETH 200-200-20 MG/5ML PO SUSP: 30.0000 mL | ORAL | Status: DC | PRN

## 2013-10-07 MED ORDER — MEMANTINE HCL 10 MG PO TABS
10.0000 mg | ORAL_TABLET | Freq: Two times a day (BID) | ORAL | Status: DC
  Administered 2013-10-07 – 2013-10-10 (×6): 10 mg via ORAL
  Filled 2013-10-07 (×7): qty 1

## 2013-10-07 MED ORDER — QUETIAPINE FUMARATE 25 MG PO TABS
25.0000 mg | ORAL_TABLET | Freq: Every day | ORAL | Status: DC
  Administered 2013-10-07 – 2013-10-09 (×3): 25 mg via ORAL
  Filled 2013-10-07 (×4): qty 1

## 2013-10-07 MED ORDER — GLYCOPYRROLATE 0.2 MG/ML IJ SOLN
INTRAMUSCULAR | Status: DC | PRN
  Administered 2013-10-07: 0.1 mg via INTRAVENOUS
  Administered 2013-10-07: 0.6 mg via INTRAVENOUS

## 2013-10-07 MED ORDER — PROPOFOL 10 MG/ML IV BOLUS
INTRAVENOUS | Status: AC
  Filled 2013-10-07: qty 20

## 2013-10-07 MED ORDER — LEVOTHYROXINE SODIUM 125 MCG PO TABS
125.0000 ug | ORAL_TABLET | Freq: Every day | ORAL | Status: DC
  Administered 2013-10-08 – 2013-10-10 (×3): 125 ug via ORAL
  Filled 2013-10-07 (×4): qty 1

## 2013-10-07 MED ORDER — SIMVASTATIN 20 MG PO TABS
20.0000 mg | ORAL_TABLET | Freq: Every day | ORAL | Status: DC
  Administered 2013-10-07 – 2013-10-09 (×3): 20 mg via ORAL
  Filled 2013-10-07 (×4): qty 1

## 2013-10-07 MED ORDER — ACETAMINOPHEN 160 MG/5ML PO SOLN
325.0000 mg | ORAL | Status: DC | PRN
  Filled 2013-10-07: qty 20.3

## 2013-10-07 MED ORDER — PNEUMOCOCCAL VAC POLYVALENT 25 MCG/0.5ML IJ INJ
0.5000 mL | INJECTION | INTRAMUSCULAR | Status: AC
  Administered 2013-10-08: 0.5 mL via INTRAMUSCULAR
  Filled 2013-10-07: qty 0.5

## 2013-10-07 MED ORDER — PANTOPRAZOLE SODIUM 40 MG PO TBEC
40.0000 mg | DELAYED_RELEASE_TABLET | Freq: Every day | ORAL | Status: DC
Start: 2013-10-07 — End: 2013-10-10
  Administered 2013-10-07 – 2013-10-10 (×4): 40 mg via ORAL
  Filled 2013-10-07 (×4): qty 1

## 2013-10-07 MED ORDER — SUCCINYLCHOLINE CHLORIDE 20 MG/ML IJ SOLN
INTRAMUSCULAR | Status: AC
  Filled 2013-10-07: qty 1

## 2013-10-07 MED ORDER — ONDANSETRON HCL 4 MG PO TABS: 4.0000 mg | ORAL_TABLET | Freq: Four times a day (QID) | ORAL | Status: DC | PRN

## 2013-10-07 MED ORDER — ROCURONIUM BROMIDE 100 MG/10ML IV SOLN
INTRAVENOUS | Status: DC | PRN
  Administered 2013-10-07: 10 mg via INTRAVENOUS
  Administered 2013-10-07: 50 mg via INTRAVENOUS
  Administered 2013-10-07: 5 mg via INTRAVENOUS
  Administered 2013-10-07: 10 mg via INTRAVENOUS

## 2013-10-07 MED ORDER — ACETAMINOPHEN 325 MG PO TABS: 650.0000 mg | ORAL_TABLET | Freq: Four times a day (QID) | ORAL | Status: DC | PRN

## 2013-10-07 MED ORDER — DOCUSATE SODIUM 100 MG PO CAPS
100.0000 mg | ORAL_CAPSULE | Freq: Two times a day (BID) | ORAL | Status: DC
  Administered 2013-10-07 – 2013-10-08 (×3): 100 mg via ORAL
  Filled 2013-10-07 (×7): qty 1

## 2013-10-07 MED ORDER — FENTANYL CITRATE 0.05 MG/ML IJ SOLN
INTRAMUSCULAR | Status: AC
  Administered 2013-10-07: 50 ug
  Filled 2013-10-07: qty 2

## 2013-10-07 MED ORDER — OXYCODONE-ACETAMINOPHEN 5-325 MG PO TABS
1.0000 | ORAL_TABLET | ORAL | Status: DC | PRN
  Administered 2013-10-07 – 2013-10-10 (×5): 1 via ORAL
  Filled 2013-10-07 (×5): qty 1

## 2013-10-07 MED ORDER — SODIUM CHLORIDE 0.9 % IV SOLN
INTRAVENOUS | Status: DC
  Administered 2013-10-08: 05:00:00 via INTRAVENOUS

## 2013-10-07 MED ORDER — OXYCODONE HCL 5 MG PO TABS
5.0000 mg | ORAL_TABLET | Freq: Once | ORAL | Status: AC | PRN
  Administered 2013-10-07: 5 mg via ORAL

## 2013-10-07 MED ORDER — KETOROLAC TROMETHAMINE 30 MG/ML IJ SOLN: 15.0000 mg | Freq: Once | INTRAMUSCULAR | Status: DC | PRN

## 2013-10-07 MED ORDER — MENTHOL 3 MG MT LOZG: 1.0000 | LOZENGE | OROMUCOSAL | Status: DC | PRN

## 2013-10-07 MED ORDER — NEOSTIGMINE METHYLSULFATE 1 MG/ML IJ SOLN
INTRAMUSCULAR | Status: DC | PRN
  Administered 2013-10-07: 1 mg via INTRAVENOUS
  Administered 2013-10-07: 4 mg via INTRAVENOUS

## 2013-10-07 MED ORDER — PIOGLITAZONE HCL 30 MG PO TABS
30.0000 mg | ORAL_TABLET | Freq: Every morning | ORAL | Status: DC
  Administered 2013-10-08 – 2013-10-10 (×3): 30 mg via ORAL
  Filled 2013-10-07 (×3): qty 1

## 2013-10-07 MED ORDER — METFORMIN HCL 500 MG PO TABS
500.0000 mg | ORAL_TABLET | Freq: Two times a day (BID) | ORAL | Status: DC
  Administered 2013-10-08 – 2013-10-10 (×5): 500 mg via ORAL
  Filled 2013-10-07 (×7): qty 1

## 2013-10-07 MED ORDER — FENTANYL CITRATE 0.05 MG/ML IJ SOLN: 25.0000 ug | INTRAMUSCULAR | Status: DC | PRN

## 2013-10-07 MED ORDER — POLYETHYLENE GLYCOL 3350 17 G PO PACK
17.0000 g | PACK | Freq: Every day | ORAL | Status: DC | PRN
Start: 2013-10-07 — End: 2013-10-10

## 2013-10-07 MED ORDER — ONDANSETRON HCL 4 MG/2ML IJ SOLN
INTRAMUSCULAR | Status: AC
  Filled 2013-10-07: qty 2

## 2013-10-07 MED ORDER — OXYCODONE HCL 5 MG/5ML PO SOLN: 5.0000 mg | Freq: Once | ORAL | Status: AC | PRN

## 2013-10-07 MED ORDER — OXYCODONE-ACETAMINOPHEN 5-325 MG PO TABS: 1.0000 | ORAL_TABLET | Freq: Three times a day (TID) | ORAL | Status: DC | PRN

## 2013-10-07 MED ORDER — ATROPINE SULFATE 0.1 MG/ML IJ SOLN
INTRAMUSCULAR | Status: AC
  Filled 2013-10-07: qty 10

## 2013-10-07 MED ORDER — LACTATED RINGERS IV SOLN
INTRAVENOUS | Status: DC | PRN
  Administered 2013-10-07: 10:00:00 via INTRAVENOUS

## 2013-10-07 MED ORDER — ACETAMINOPHEN 325 MG PO TABS: 325.0000 mg | ORAL_TABLET | ORAL | Status: DC | PRN

## 2013-10-07 MED ORDER — LATANOPROST 0.005 % OP SOLN
1.0000 [drp] | Freq: Every day | OPHTHALMIC | Status: DC
  Administered 2013-10-07 – 2013-10-09 (×3): 1 [drp] via OPHTHALMIC
  Filled 2013-10-07: qty 2.5

## 2013-10-07 MED ORDER — ROPIVACAINE HCL 5 MG/ML IJ SOLN
INTRAMUSCULAR | Status: DC | PRN
  Administered 2013-10-07: 20 mL via PERINEURAL

## 2013-10-07 MED ORDER — ONDANSETRON HCL 4 MG/2ML IJ SOLN
INTRAMUSCULAR | Status: DC | PRN
  Administered 2013-10-07: 4 mg via INTRAVENOUS

## 2013-10-07 MED ORDER — OXYCODONE HCL 5 MG PO TABS
ORAL_TABLET | ORAL | Status: AC
  Filled 2013-10-07: qty 1

## 2013-10-07 MED ORDER — HYDROMORPHONE HCL PF 1 MG/ML IJ SOLN
0.5000 mg | INTRAMUSCULAR | Status: DC | PRN
  Administered 2013-10-07 – 2013-10-08 (×3): 1 mg via INTRAVENOUS
  Filled 2013-10-07 (×3): qty 1

## 2013-10-07 MED ORDER — DEXTROSE 5 % IV SOLN
INTRAVENOUS | Status: DC | PRN
  Administered 2013-10-07: 11:00:00 via INTRAVENOUS

## 2013-10-07 MED ORDER — PROPOFOL 10 MG/ML IV BOLUS
INTRAVENOUS | Status: DC | PRN
  Administered 2013-10-07: 100 mg via INTRAVENOUS

## 2013-10-07 MED ORDER — TRAMADOL HCL 50 MG PO TABS: 50.0000 mg | ORAL_TABLET | Freq: Four times a day (QID) | ORAL | Status: DC | PRN

## 2013-10-07 MED ORDER — ONDANSETRON HCL 4 MG/2ML IJ SOLN: 4.0000 mg | Freq: Once | INTRAMUSCULAR | Status: DC | PRN

## 2013-10-07 MED ORDER — MIDAZOLAM HCL 2 MG/2ML IJ SOLN
INTRAMUSCULAR | Status: AC
  Filled 2013-10-07: qty 2

## 2013-10-07 MED ORDER — NEOSTIGMINE METHYLSULFATE 1 MG/ML IJ SOLN
INTRAMUSCULAR | Status: AC
  Filled 2013-10-07: qty 10

## 2013-10-07 MED ORDER — ROCURONIUM BROMIDE 50 MG/5ML IV SOLN
INTRAVENOUS | Status: AC
Start: 2013-10-07 — End: 2013-10-07
  Filled 2013-10-07: qty 1

## 2013-10-07 MED ORDER — PHENYLEPHRINE HCL 10 MG/ML IJ SOLN
10.0000 mg | INTRAMUSCULAR | Status: DC | PRN
  Administered 2013-10-07: 40 ug/min via INTRAVENOUS

## 2013-10-07 MED ORDER — METHOCARBAMOL 500 MG PO TABS
500.0000 mg | ORAL_TABLET | Freq: Four times a day (QID) | ORAL | Status: DC | PRN
  Administered 2013-10-07 – 2013-10-10 (×6): 500 mg via ORAL
  Filled 2013-10-07 (×6): qty 1

## 2013-10-07 MED ORDER — EPHEDRINE SULFATE 50 MG/ML IJ SOLN
INTRAMUSCULAR | Status: AC
  Filled 2013-10-07: qty 1

## 2013-10-07 MED ORDER — FLUOXETINE HCL 20 MG PO CAPS
20.0000 mg | ORAL_CAPSULE | Freq: Every morning | ORAL | Status: DC
  Administered 2013-10-08 – 2013-10-10 (×3): 20 mg via ORAL
  Filled 2013-10-07 (×3): qty 1

## 2013-10-07 MED ORDER — HYDROXYZINE HCL 25 MG PO TABS: 25.0000 mg | ORAL_TABLET | Freq: Four times a day (QID) | ORAL | Status: DC | PRN

## 2013-10-07 MED ORDER — GLIPIZIDE ER 5 MG PO TB24
5.0000 mg | ORAL_TABLET | Freq: Every day | ORAL | Status: DC
  Administered 2013-10-07 – 2013-10-09 (×3): 5 mg via ORAL
  Filled 2013-10-07 (×4): qty 1

## 2013-10-07 MED ORDER — METHOCARBAMOL 500 MG PO TABS
ORAL_TABLET | ORAL | Status: AC
  Filled 2013-10-07: qty 1

## 2013-10-07 MED ORDER — MIRABEGRON ER 50 MG PO TB24
50.0000 mg | ORAL_TABLET | Freq: Every evening | ORAL | Status: DC
  Administered 2013-10-07 – 2013-10-09 (×3): 50 mg via ORAL
  Filled 2013-10-07 (×4): qty 1

## 2013-10-07 MED ORDER — CLOPIDOGREL BISULFATE 75 MG PO TABS
75.0000 mg | ORAL_TABLET | Freq: Every day | ORAL | Status: DC
  Administered 2013-10-08 – 2013-10-10 (×3): 75 mg via ORAL
  Filled 2013-10-07 (×4): qty 1

## 2013-10-07 MED ORDER — SODIUM CHLORIDE 0.9 % IR SOLN
Status: DC | PRN
Start: 2013-10-07 — End: 2013-10-07
  Administered 2013-10-07: 1000 mL

## 2013-10-07 MED ORDER — LIDOCAINE HCL (CARDIAC) 20 MG/ML IV SOLN
INTRAVENOUS | Status: DC | PRN
  Administered 2013-10-07: 70 mg via INTRAVENOUS

## 2013-10-07 MED ORDER — CEFAZOLIN SODIUM-DEXTROSE 2-3 GM-% IV SOLR
2.0000 g | Freq: Four times a day (QID) | INTRAVENOUS | Status: AC
  Administered 2013-10-07 – 2013-10-08 (×3): 2 g via INTRAVENOUS
  Filled 2013-10-07 (×3): qty 50

## 2013-10-07 MED ORDER — GLYCOPYRROLATE 0.2 MG/ML IJ SOLN
INTRAMUSCULAR | Status: AC
  Filled 2013-10-07: qty 3

## 2013-10-07 MED ORDER — ONDANSETRON HCL 4 MG/2ML IJ SOLN: 4.0000 mg | Freq: Four times a day (QID) | INTRAMUSCULAR | Status: DC | PRN

## 2013-10-07 SURGICAL SUPPLY — 71 items
BENZOIN TINCTURE PRP APPL 2/3 (GAUZE/BANDAGES/DRESSINGS) ×3 IMPLANT
BLADE SAW SAG 29X58X.64 (BLADE) ×3 IMPLANT
BLADE SURG ROTATE 9660 (MISCELLANEOUS) IMPLANT
BOWL SMART MIX CTS (DISPOSABLE) IMPLANT
CEMENT BONE DEPUY (Cement) ×3 IMPLANT
CLOTH BEACON ORANGE TIMEOUT ST (SAFETY) IMPLANT
COVER SURGICAL LIGHT HANDLE (MISCELLANEOUS) ×3 IMPLANT
COVER TABLE BACK 60X90 (DRAPES) ×3 IMPLANT
DRAPE C-ARM 42X72 X-RAY (DRAPES) IMPLANT
DRAPE INCISE IOBAN 66X45 STRL (DRAPES) ×6 IMPLANT
DRAPE PROXIMA HALF (DRAPES) ×3 IMPLANT
DRAPE U-SHAPE 47X51 STRL (DRAPES) ×6 IMPLANT
DRILL BIT 7/64X5 (BIT) ×3 IMPLANT
DRSG PAD ABDOMINAL 8X10 ST (GAUZE/BANDAGES/DRESSINGS) ×6 IMPLANT
DURAPREP 26ML APPLICATOR (WOUND CARE) ×3 IMPLANT
ELECT CAUTERY BLADE 6.4 (BLADE) IMPLANT
ELECT NEEDLE TIP 2.8 STRL (NEEDLE) ×3 IMPLANT
ELECT REM PT RETURN 9FT ADLT (ELECTROSURGICAL) ×3
ELECTRODE REM PT RTRN 9FT ADLT (ELECTROSURGICAL) ×1 IMPLANT
EVACUATOR 1/8 PVC DRAIN (DRAIN) IMPLANT
GAUZE XEROFORM 5X9 LF (GAUZE/BANDAGES/DRESSINGS) ×3 IMPLANT
GLENOID ANCHOR PEG CROSSLK 48 (Orthopedic Implant) ×3 IMPLANT
GLOVE BIOGEL PI IND STRL 8 (GLOVE) ×2 IMPLANT
GLOVE BIOGEL PI INDICATOR 8 (GLOVE) ×4
GLOVE ECLIPSE 7.5 STRL STRAW (GLOVE) ×6 IMPLANT
GOWN SRG XL XLNG 56XLVL 4 (GOWN DISPOSABLE) ×2 IMPLANT
GOWN STRL NON-REIN LRG LVL3 (GOWN DISPOSABLE) ×3 IMPLANT
GOWN STRL NON-REIN XL XLG LVL4 (GOWN DISPOSABLE) ×4
HANDPIECE INTERPULSE COAX TIP (DISPOSABLE)
HEAD HUM ECCENTRIC 48X18 STRL (Trauma) ×3 IMPLANT
HOOD PEEL AWAY FACE SHEILD DIS (HOOD) ×6 IMPLANT
HUMERAL STEM 14MM (Trauma) ×3 IMPLANT
KIT BASIN OR (CUSTOM PROCEDURE TRAY) ×3 IMPLANT
KIT ROOM TURNOVER OR (KITS) ×3 IMPLANT
MANIFOLD NEPTUNE II (INSTRUMENTS) ×3 IMPLANT
NDL SUT 2 .5 CRC MAYO 1.732X (NEEDLE) ×1 IMPLANT
NEEDLE 22X1 1/2 (OR ONLY) (NEEDLE) IMPLANT
NEEDLE MAYO TAPER (NEEDLE) ×2
NOZZLE PRISM 8.5MM (MISCELLANEOUS) IMPLANT
NS IRRIG 1000ML POUR BTL (IV SOLUTION) ×3 IMPLANT
PACK SHOULDER (CUSTOM PROCEDURE TRAY) ×3 IMPLANT
PAD ARMBOARD 7.5X6 YLW CONV (MISCELLANEOUS) ×6 IMPLANT
PASSER SUT SWANSON 36MM LOOP (INSTRUMENTS) IMPLANT
PIN METAGLENE 2.5 (PIN) ×3 IMPLANT
PRESSURIZER FEMORAL UNIV (MISCELLANEOUS) IMPLANT
SET HNDPC FAN SPRY TIP SCT (DISPOSABLE) IMPLANT
SLING ARM IMMOBILIZER LRG (SOFTGOODS) ×3 IMPLANT
SLING ARM IMMOBILIZER MED (SOFTGOODS) IMPLANT
SMARTMIX MINI TOWER (MISCELLANEOUS) ×3
SPONGE LAP 18X18 X RAY DECT (DISPOSABLE) ×6 IMPLANT
SPONGE LAP 4X18 X RAY DECT (DISPOSABLE) ×6 IMPLANT
STAPLER VISISTAT 35W (STAPLE) ×3 IMPLANT
SUCTION FRAZIER TIP 10 FR DISP (SUCTIONS) ×3 IMPLANT
SUT ETHIBOND 2 V 37 (SUTURE) IMPLANT
SUT FIBERWIRE #2 38 T-5 BLUE (SUTURE) ×6
SUT SILK 2 0 (SUTURE)
SUT SILK 2-0 18XBRD TIE 12 (SUTURE) IMPLANT
SUT VIC AB 0 CT1 27 (SUTURE) ×4
SUT VIC AB 0 CT1 27XBRD ANBCTR (SUTURE) ×2 IMPLANT
SUT VIC AB 2-0 CT1 27 (SUTURE) ×2
SUT VIC AB 2-0 CT1 TAPERPNT 27 (SUTURE) ×1 IMPLANT
SUT VICRYL 0 TIES 12 18 (SUTURE) ×3 IMPLANT
SUTURE FIBERWR #2 38 T-5 BLUE (SUTURE) ×2 IMPLANT
SYR CONTROL 10ML LL (SYRINGE) IMPLANT
TOWEL OR 17X24 6PK STRL BLUE (TOWEL DISPOSABLE) ×3 IMPLANT
TOWEL OR 17X26 10 PK STRL BLUE (TOWEL DISPOSABLE) ×3 IMPLANT
TOWER CARTRIDGE SMART MIX (DISPOSABLE) IMPLANT
TOWER SMARTMIX MINI (MISCELLANEOUS) ×1 IMPLANT
TRAY FOLEY CATH 16FRSI W/METER (SET/KITS/TRAYS/PACK) IMPLANT
WATER STERILE IRR 1000ML POUR (IV SOLUTION) ×3 IMPLANT
YANKAUER SUCT BULB TIP NO VENT (SUCTIONS) ×3 IMPLANT

## 2013-10-07 NOTE — Discharge Instructions (Signed)
Needs daily PT/OT  FF to 90 degrees  Avoid ER Wear shoulder immobilizer at all times except when getting PT.

## 2013-10-07 NOTE — Anesthesia Postprocedure Evaluation (Signed)
  Anesthesia Post-op Note  Patient: [name] [name]  Procedure(s) Performed: Procedure(s): TOTAL SHOULDER ARTHROPLASTY (Right)  Patient Location: PACU  Anesthesia Type:General and Regional  Level of Consciousness: awake, alert  and oriented  Airway and Oxygen Therapy: Patient Spontanous Breathing and Patient connected to nasal cannula oxygen  Post-op Pain: none  Post-op Assessment: Post-op Vital signs reviewed, Patient's Cardiovascular Status Stable, Respiratory Function Stable, Patent Airway, No signs of Nausea or vomiting and Pain level controlled  Post-op Vital Signs: Reviewed and stable  Complications: No apparent anesthesia complications

## 2013-10-07 NOTE — Progress Notes (Signed)
UR completed 

## 2013-10-07 NOTE — H&P (Signed)
PREOPERATIVE H&P  Chief Complaint: r shoulder pain  HPI: [name] [name] is a 80 y.o. female who presents for evaluation of r shoulder pain. It has been present for greater than 1 year and has been worsening. She has failed conservative measures including PT, OT, injection therapy, and activity modification. Pain is rated as severe.  Past Medical History  Diagnosis Date  . Hypertension   . Depression   . CAD (coronary artery disease)   . GERD (gastroesophageal reflux disease)   . Bilateral carotid artery stenosis   . Diverticulitis   . Obesity   . Degenerative joint disease   . Hiatal hernia   . Dementia     early   . Atrial fibrillation with rapid ventricular response   . Hypothyroidism   . Type II diabetes mellitus   . Anemia   . Stroke 2011    "old very small TIA/neurologist"  . Chronic lower back pain     "back is very stiff because of fusion; can't stand up straight more than few moments"  . Bladder incontinence     Hx: of  . Complication of anesthesia     anesth. meds caused hallucinations   . Anxiety   . Endometriosis   . Memory loss   . Fall at home 05/15/2013  . Progressive dementia with uncertain etiology [id number]    Suspect Lewy body .  [name] Bowel incontinence    Past Surgical History  Procedure Laterality Date  . Ptca  03/10/12    LLE  . Coronary artery bypass graft  04/17/2011    LT  internal mammary artery to left anterior descending, saphenous vein graft first diagonal, saphenous  vein graft to obtuse marginal 1, saphenous vein graft to posterior descending  . Cholecystectomy    . Posterior fusion lumbar spine      "removed L4-5, S1"  . Total knee arthroplasty  ~ 2004    right  . Partial hip arthroplasty  11/2009    left; "just the ball"  . Cataract extraction w/ intraocular lens  implant, bilateral    . Neuroplasty / transposition median nerve at carpal tunnel bilateral    . Hand surgery      "for arthritis; right hand"  . Joint replacement    .  Dilation and curettage of uterus      "for endometrosis"  . Scleral buckle Right 01/20/2013    Procedure: SCLERAL BUCKLE;  Surgeon: Hayden Pedro, MD;  Location: [place];  Service: Ophthalmology;  Laterality: Right;  . Laser photo ablation Right 01/20/2013    Procedure: LASER PHOTO ABLATION;  Surgeon: Hayden Pedro, MD;  Location: [place];  Service: Ophthalmology;  Laterality: Right;  . Back surgery      X2  . Carpal tunnel release Bilateral   . Dilation and curettage of uterus      ENDOMETRIOSIS   History   Social History  . Marital Status: Divorced    Spouse Name: N/A    Number of Children: 2  . Years of Education: COLLEGE   Occupational History  .      retired Pharmacist, hospital   Social History Main Topics  . Smoking status: Former Smoker -- 1.50 packs/day    Types: Cigarettes    Quit date: 09/16/1987  . Smokeless tobacco: Never Used  . Alcohol Use: No  . Drug Use: No  . Sexual Activity: No   Other Topics Concern  . None   Social History Narrative  . None  Family History  Problem Relation Age of Onset  . Cancer Paternal Aunt   . Cancer Cousin    Allergies  Allergen Reactions  . Vicodin [Hydrocodone-Acetaminophen] Other (See Comments)    Hallucinations - do NOT verify Vicodin orders  . Polysaccharide Iron Complex Hives, Itching and Rash    Patient can tolerate Ferrous Sulfate  . Amitriptyline     hallucinations  . Vesicare [Solifenacin]     hallucinations   Prior to Admission medications   Medication Sig Start Date End Date Taking? Authorizing Provider  acetaminophen (TYLENOL) 500 MG tablet Take 500-1,000 mg by mouth daily as needed (for arthritis pain).   Yes Historical Provider, MD  ascorbic acid (VITAMIN C) 1000 MG tablet Take 1,000 mg by mouth every Monday, Wednesday, and Friday.    Yes Historical Provider, MD  bimatoprost (LUMIGAN) 0.03 % ophthalmic solution Place 1 drop into both eyes at bedtime.     Yes Historical Provider, MD  cholecalciferol (VITAMIN D) 1000  UNITS tablet Take 1,000 Units by mouth See admin instructions. On Monday, Tuesday, Wednesday, Friday, Saturday.   Yes Historical Provider, MD  clopidogrel (PLAVIX) 75 MG tablet Take 75 mg by mouth daily with breakfast.   Yes Historical Provider, MD  ergocalciferol (VITAMIN D2) 50000 UNITS capsule Take 50,000 Units by mouth 2 (two) times a week. On Thursday and Sunday   Yes Historical Provider, MD  felodipine (PLENDIL) 5 MG 24 hr tablet Take 5 mg by mouth every morning.    Yes Historical Provider, MD  FLUoxetine (PROZAC) 20 MG capsule Take 20 mg by mouth every morning.    Yes Historical Provider, MD  glipiZIDE (GLUCOTROL XL) 5 MG 24 hr tablet Take 5 mg by mouth at bedtime.    Yes Historical Provider, MD  Insulin Glargine (LANTUS SOLOSTAR) 100 UNIT/ML SOPN Inject 10 Units into the skin at bedtime.   Yes Historical Provider, MD  levothyroxine (SYNTHROID, LEVOTHROID) 125 MCG tablet Take 125 mcg by mouth daily before breakfast.    Yes Historical Provider, MD  memantine (NAMENDA) 10 MG tablet Take 10 mg by mouth 2 (two) times daily.   Yes Historical Provider, MD  metFORMIN (GLUCOPHAGE) 500 MG tablet Take 500 mg by mouth 2 (two) times daily with a meal.   Yes Historical Provider, MD  metoprolol tartrate (LOPRESSOR) 25 MG tablet Take 25 mg by mouth 2 (two) times daily.     Yes Historical Provider, MD  mirabegron ER (MYRBETRIQ) 50 MG TB24 Take 50 mg by mouth every evening.   Yes Historical Provider, MD  omeprazole (PRILOSEC) 20 MG capsule Take 20 mg by mouth daily.     Yes Historical Provider, MD  pioglitazone (ACTOS) 30 MG tablet Take 30 mg by mouth every morning.    Yes Historical Provider, MD  pravastatin (PRAVACHOL) 40 MG tablet Take 40 mg by mouth every evening.    Yes Historical Provider, MD  QUEtiapine (SEROQUEL) 25 MG tablet Take 1 tablet (25 mg total) by mouth at bedtime. May repeat once if needed 09/29/13  Yes Larey Seat, MD  hydrOXYzine (ATARAX/VISTARIL) 25 MG tablet Take 25 mg by mouth every 6  (six) hours as needed for itching.    Historical Provider, MD  traMADol (ULTRAM) 50 MG tablet Take 50 mg by mouth every 6 (six) hours as needed for pain.     Historical Provider, MD     Positive ROS: none  All other systems have been reviewed and were otherwise negative with the exception of those mentioned  in the HPI and as above.  Physical Exam: Filed Vitals:   10/07/13 0904  BP: 173/53  Pulse: 72  Temp:   Resp: 18    General: Alert, no acute distress Cardiovascular: No pedal edema Respiratory: No cyanosis, no use of accessory musculature GI: No organomegaly, abdomen is soft and non-tender Skin: No lesions in the area of chief complaint Neurologic: Sensation intact distally Psychiatric: Patient is competent for consent with normal mood and affect Lymphatic: No axillary or cervical lymphadenopathy  MUSCULOSKELETAL:painful ROM.  Limited rom good er strength X-RAY: severe bone on bone arthritis good bone quality  Assessment/Plan: SEVERE BONE ON BONE OSTEOARTHRITIS RIGHT SHOULDER Plan for Procedure(s): TOTAL SHOULDER ARTHROPLASTY  The risks benefits and alternatives were discussed with the patient including but not limited to the risks of nonoperative treatment, versus surgical intervention including infection, bleeding, nerve injury, malunion, nonunion, hardware prominence, hardware failure, need for hardware removal, blood clots, cardiopulmonary complications, morbidity, mortality, among others, and they were willing to proceed.  Predicted outcome is good, although there will be at least a six to nine month expected recovery.  Hoda Hon L, MD 10/07/2013 9:28 AM

## 2013-10-07 NOTE — Transfer of Care (Signed)
Immediate Anesthesia Transfer of Care Note  Patient: [name] [name]  Procedure(s) Performed: Procedure(s): TOTAL SHOULDER ARTHROPLASTY (Right)  Patient Location: PACU  Anesthesia Type:General  Level of Consciousness: awake, sedated and patient cooperative  Airway & Oxygen Therapy: Patient Spontanous Breathing and Patient connected to nasal cannula oxygen  Post-op Assessment: Report given to PACU RN, Post -op Vital signs reviewed and stable and Patient moving all extremities  Post vital signs: Reviewed and stable  Complications: No apparent anesthesia complications

## 2013-10-07 NOTE — Anesthesia Preprocedure Evaluation (Addendum)
Anesthesia Evaluation  Patient identified by MRN, date of birth, ID band Patient awake    Reviewed: Allergy & Precautions, H&P , NPO status , Patient's Chart, lab work & pertinent test results, reviewed documented beta blocker date and time   History of Anesthesia Complications Negative for: history of anesthetic complications  Airway Mallampati: III TM Distance: >3 FB Neck ROM: Full    Dental  (+) Teeth Intact and Dental Advisory Given   Pulmonary neg shortness of breath, neg COPDformer smoker,    Pulmonary exam normal       Cardiovascular Exercise Tolerance: Poor hypertension, Pt. on medications + CAD, + Past MI and + Peripheral Vascular Disease + dysrhythmias Rhythm:Regular Rate:Normal     Neuro/Psych Anxiety Depression  Neuromuscular disease CVA, No Residual Symptoms    GI/Hepatic hiatal hernia, GERD-  Medicated and Controlled,  Endo/Other  diabetes, Well Controlled, Type 2, Oral Hypoglycemic Agents and Insulin DependentHypothyroidism Lantus hs  Renal/GU  Bladder dysfunction      Musculoskeletal  (+) Arthritis -, Osteoarthritis,    Abdominal   Peds  Hematology negative hematology ROS (+)   Anesthesia Other Findings   Reproductive/Obstetrics                          Anesthesia Physical Anesthesia Plan  ASA: III  Anesthesia Plan: General and Regional   Post-op Pain Management:    Induction: Intravenous  Airway Management Planned: Oral ETT  Additional Equipment: None  Intra-op Plan:   Post-operative Plan: Extubation in OR  Informed Consent: I have reviewed the patients History and Physical, chart, labs and discussed the procedure including the risks, benefits and alternatives for the proposed anesthesia with the patient or authorized representative who has indicated his/her understanding and acceptance.   Dental advisory given  Plan Discussed with: CRNA and  Surgeon  Anesthesia Plan Comments:         Anesthesia Quick Evaluation

## 2013-10-07 NOTE — Preoperative (Addendum)
Beta Blockers   Reason not to administer Beta Blockers:Lopressor 8 a.m. 

## 2013-10-07 NOTE — Anesthesia Procedure Notes (Addendum)
Anesthesia Regional Block:  Supraclavicular block  Pre-Anesthetic Checklist: ,, timeout performed, Correct Patient, Correct Site, Correct Laterality, Correct Procedure, Correct Position, site marked, Risks and benefits discussed,  Surgical consent,  Pre-op evaluation,  At surgeon's request and post-op pain management  Laterality: Right  Prep: chloraprep       Needles:  Injection technique: Single-shot  Needle Type: Stimiplex          Additional Needles:  Procedures: ultrasound guided (picture in chart) Supraclavicular block Narrative:  Start time: 10/07/2013 9:52 AM End time: 10/07/2013 10:02 AM Injection made incrementally with aspirations every 5 mL.  Performed by: Personally  Anesthesiologist: Moser   Procedure Name: Intubation Date/Time: 10/07/2013 10:27 AM Performed by: Terrill Mohr Pre-anesthesia Checklist: Patient identified, Emergency Drugs available, Suction available and Patient being monitored Patient Re-evaluated:Patient Re-evaluated prior to inductionOxygen Delivery Method: Circle system utilized Preoxygenation: Pre-oxygenation with 100% oxygen Intubation Type: IV induction Ventilation: Mask ventilation without difficulty Laryngoscope Size: Mac and 3 Tube type: Oral Tube size: 7.5 mm Number of attempts: 1 Airway Equipment and Method: Stylet Placement Confirmation: ETT inserted through vocal cords under direct vision,  breath sounds checked- equal and bilateral and positive ETCO2 Secured at: 22 (cm at teeth) cm Tube secured with: Tape Dental Injury: Teeth and Oropharynx as per pre-operative assessment

## 2013-10-07 NOTE — Brief Op Note (Signed)
10/07/2013  1:27 PM  PATIENT:  [name] [name]  80 y.o. female  PRE-OPERATIVE DIAGNOSIS:  SEVERE BONE ON BONE OSTEOARTHRITIS RIGHT SHOULDER  POST-OPERATIVE DIAGNOSIS:  severe bone on bone osteoarthritis right shoulder  PROCEDURE:  Procedure(s): TOTAL SHOULDER ARTHROPLASTY (Right)  SURGEON:  Surgeon(s) and Role:    * Alta Corning, MD - Primary  PHYSICIAN ASSISTANT:   ASSISTANTS: bethune   ANESTHESIA:   general  EBL:  Total I/O In: 50 [I.V.:50] Out: 495 [Urine:345; Blood:150]  BLOOD ADMINISTERED:none  DRAINS: none   LOCAL MEDICATIONS USED:  NONE  SPECIMEN:  No Specimen  DISPOSITION OF SPECIMEN:  N/A  COUNTS:  YES  TOURNIQUET:  * No tourniquets in log *  DICTATION: .Other Dictation: Dictation Number [id number]  PLAN OF CARE: Admit to inpatient   PATIENT DISPOSITION:  PACU - hemodynamically stable.   Delay start of Pharmacological VTE agent (>24hrs) due to surgical blood loss or risk of bleeding: no

## 2013-10-08 LAB — CBC
HCT: 32.6 % — ABNORMAL LOW (ref 36.0–46.0)
Hemoglobin: 10.8 g/dL — ABNORMAL LOW (ref 12.0–15.0)
MCH: 32.5 pg (ref 26.0–34.0)
MCHC: 33.1 g/dL (ref 30.0–36.0)
MCV: 98.2 fL (ref 78.0–100.0)
Platelets: 204 10*3/uL (ref 150–400)
RBC: 3.32 MIL/uL — ABNORMAL LOW (ref 3.87–5.11)
RDW: 13.4 % (ref 11.5–15.5)
WBC: 7.4 10*3/uL (ref 4.0–10.5)

## 2013-10-08 LAB — BASIC METABOLIC PANEL
BUN: 24 mg/dL — ABNORMAL HIGH (ref 6–23)
CO2: 23 mEq/L (ref 19–32)
Calcium: 8.4 mg/dL (ref 8.4–10.5)
Chloride: 99 mEq/L (ref 96–112)
Creatinine, Ser: 0.95 mg/dL (ref 0.50–1.10)
GFR calc non Af Amer: 56 mL/min — ABNORMAL LOW (ref >90)
GFR, EST AFRICAN AMERICAN: 65 mL/min — AB (ref >90)
Glucose, Bld: 219 mg/dL — ABNORMAL HIGH (ref 70–99)
POTASSIUM: 4.3 meq/L (ref 3.7–5.3)
SODIUM: 139 meq/L (ref 137–147)

## 2013-10-08 LAB — GLUCOSE, CAPILLARY
GLUCOSE-CAPILLARY: 184 mg/dL — AB (ref 70–99)
Glucose-Capillary: 145 mg/dL — ABNORMAL HIGH (ref 70–99)
Glucose-Capillary: 102 mg/dL — ABNORMAL HIGH (ref 70–99)
Glucose-Capillary: 198 mg/dL — ABNORMAL HIGH (ref 70–99)

## 2013-10-08 LAB — MRSA PCR SCREENING: MRSA BY PCR: POSITIVE — AB

## 2013-10-08 NOTE — Progress Notes (Signed)
PT Cancellation Note  Patient Details Name: LAKOTA SCHWEPPE MRN: [id number] DOB: March 19, 1934   Cancelled Treatment:    Reason Eval/Treat Not Completed: Fatigue/lethargy limiting ability to participate. PT checked on pt twice this morning, once pt on bedside commode, and the second time pt was back in bed and very fatigued from the activity. Will attempt eval again tomorrow morning.   Jolyn Lent 10/08/2013, 12:18 PM  Jolyn Lent, PT, DPT [phone]

## 2013-10-08 NOTE — Progress Notes (Signed)
Subjective: R TSA 1-23 Sitting up, states she is still groggy from anesthesia. Ate breakfast   Objective: Vital signs in last 24 hours: Temp:  [97.5 F (36.4 C)-99.6 F (37.6 C)] 98.9 F (37.2 C) (01/24 1025) Pulse Rate:  [65-77] 65 (01/24 1025) Resp:  [15-21] 16 (01/24 0900) BP: (139-161)/(41-65) 146/48 mmHg (01/24 1044) SpO2:  [89 %-99 %] 96 % (01/24 1025) Weight:  [78.926 kg (174 lb)] 78.926 kg (174 lb) (01/23 1700)  Intake/Output from previous day: 01/23 0701 - 01/24 0700 In: 170 [P.O.:120; I.V.:50] Out: 3645 [Urine:3495; Blood:150] Intake/Output this shift: Total I/O In: 600 [I.V.:600] Out: -    Recent Labs  10/08/13 0534  HGB 10.8*    Recent Labs  10/08/13 0534  WBC 7.4  RBC 3.32*  HCT 32.6*  PLT 204    Recent Labs  10/08/13 0534  NA 139  K 4.3  CL 99  CO2 23  BUN 24*  CREATININE 0.95  GLUCOSE 219*  CALCIUM 8.4   No results found for this basename: LABPT, INR,  in the last 72 hours  Dressing clean/dry Intact LT sens Ax/R/M/U Abducts fingers, flexes them, extends thumb Not asked to fire deltoid yet  Assessment/Plan: Doing well Plan to d/c to [facility] on Monday   Benny Deutschman A. 10/08/2013, 11:08 AM

## 2013-10-08 NOTE — Progress Notes (Signed)
Occupational Therapy Evaluation Patient Details Name: [name] [name] MRN: [id number] DOB: 03/22/1934 Today's Date: 10/08/2013 Time: 3295-1884 OT Time Calculation (min): 50 min  OT Assessment / Plan / Recommendation History of present illness R TSA. PMH  Includes back surgery, CABG, R TKA   Clinical Impression   PTA, pt lived alone and was mod I with ADL and mobility. Pt states she used A RW for mobility due to having more difficulty with mobility. [facility], which is very appropriate. Began R UE ROM per orders. Moving elbow and hand well. Only able to tolerate @ 10 degrees of R PROM in FF. Given handout for TSA. Pt encouraged to loosen sling strap several times and day and complete R elbow/wrist/hand ROM close to body. Will see tomorrow    OT Assessment  Patient needs continued OT Services    Follow Up Recommendations  SNF    Barriers to Discharge Decreased caregiver support    Equipment Recommendations  None recommended by OT    Recommendations for Other Services    Frequency  Min 3X/week    Precautions / Restrictions Precautions Precautions: Shoulder Type of Shoulder Precautions: shoulder FF to90 PROM. no ER. elbow wirst and hand A/AAROM withint tolerance Shoulder Interventions: Shoulder sling/immobilizer;Off for dressing/bathing/exercises Precaution Booklet Issued: Yes (comment) Required Braces or Orthoses: Sling Restrictions Weight Bearing Restrictions: Yes RUE Weight Bearing: Non weight bearing   Pertinent Vitals/Pain BP 128/42 O2 RA 91-92 sitting. 2L 97    ADL  Eating/Feeding: Set up Where Assessed - Eating/Feeding: Chair Grooming: Moderate assistance Where Assessed - Grooming: Supported sitting Upper Body Bathing: Maximal assistance Where Assessed - Upper Body Bathing: Supported sitting Lower Body Bathing: +1 Total assistance Where Assessed - Lower Body Bathing: Supported sit to stand Upper Body Dressing: Maximal  assistance Where Assessed - Upper Body Dressing: Supported sitting Lower Body Dressing: +1 Total assistance Where Assessed - Lower Body Dressing: Supported sit to Lobbyist: Moderate assistance Toilet Transfer Method: Stand pivot Science writer: Bedside commode Toileting - Clothing Manipulation and Hygiene: +1 Total assistance Where Assessed - Toileting Clothing Manipulation and Hygiene: Sit to stand from 3-in-1 or toilet Equipment Used: Gait belt Transfers/Ambulation Related to ADLs: mod A. Very unsteady ADL Comments: Significant functional decline    OT Diagnosis: Generalized weakness;Acute pain  OT Problem List: Decreased strength;Decreased range of motion;Decreased activity tolerance;Impaired balance (sitting and/or standing);Decreased safety awareness;Decreased knowledge of use of DME or AE;Decreased knowledge of precautions;Obesity;Impaired UE functional use;Pain;Increased edema OT Treatment Interventions: Self-care/ADL training;Therapeutic exercise;DME and/or AE instruction;Therapeutic activities;Patient/family education   OT Goals(Current goals can be found in the care plan section) Acute Rehab OT Goals Patient Stated Goal: to be out of pain OT Goal Formulation: With patient Time For Goal Achievement: 10/22/13 Potential to Achieve Goals: Good  Visit Information  Last OT Received On: 10/08/13 Assistance Needed: +1 History of Present Illness: R TSA       Prior Functioning     Home Living Family/patient expects to be discharged to:: Skilled nursing facility Living Arrangements: Alone Additional Comments: Has had a "few falls" Prior Function Level of Independence: Independent with assistive device(s) Comments: Used RW Communication Communication: No difficulties Dominant Hand: Left         Vision/Perception Vision - History Baseline Vision: No visual deficits   Cognition  Cognition Arousal/Alertness: Awake/alert Behavior During  Therapy: WFL for tasks assessed/performed Overall Cognitive Status: Within Functional Limits for tasks assessed    Extremity/Trunk Assessment Upper  Extremity Assessment Upper Extremity Assessment: RUE deficits/detail RUE Deficits / Details: s/p TSA RUE Coordination: decreased fine motor;decreased gross motor Lower Extremity Assessment Lower Extremity Assessment: Generalized weakness;RLE deficits/detail;LLE deficits/detail;Defer to PT evaluation RLE Deficits / Details: s/p R TKA LLE Deficits / Details: "problems with my hip" Cervical / Trunk Assessment Cervical / Trunk Assessment: Normal     Mobility Bed Mobility Overal bed mobility: Needs Assistance Bed Mobility: Supine to Sit Supine to sit: Mod assist;HOB elevated General bed mobility comments: using legs and LUE to assist Transfers Overall transfer level: Needs assistance Transfers: Sit to/from Stand;Stand Pivot Transfers Sit to Stand: Mod assist Stand pivot transfers: Max assist General transfer comment: very unsteady     Exercise Shoulder Exercises Shoulder Flexion: PROM (only able to tolerate @ 10 degrees) Elbow Flexion: AAROM;Right;10 reps;Seated Elbow Extension: AAROM;Right;10 reps;Seated Wrist Flexion: AAROM;Right;10 reps;Seated Wrist Extension: AAROM;Right;10 reps;Seated Digit Composite Flexion: AROM;Right;10 reps Composite Extension: AROM;Right;10 reps Donning/doffing shirt without moving shoulder: Maximal assistance Method for sponge bathing under operated UE: Maximal assistance Donning/doffing sling/immobilizer: Maximal assistance Correct positioning of sling/immobilizer: Maximal assistance Pendulum exercises (written home exercise program): Maximal assistance ROM for elbow, wrist and digits of operated UE: Maximal assistance Sling wearing schedule (on at all times/off for ADL's): Maximal assistance Proper positioning of operated UE when showering: Maximal assistance   Balance Balance Overall balance  assessment: Needs assistance Sitting-balance support: Single extremity supported;Feet supported Sitting balance-Leahy Scale: Good Standing balance support: Bilateral upper extremity supported;During functional activity Standing balance-Leahy Scale: Poor General Comments General comments (skin integrity, edema, etc.): edematous R shoulder   End of Session OT - End of Session Equipment Utilized During Treatment: Gait belt Activity Tolerance: Patient tolerated treatment well Patient left: in chair;with call bell/phone within reach Nurse Communication: Mobility status;Precautions;Weight bearing status  GO     [name] [name],[name] [name] 10/08/2013, 11:19 AM [name] [name], OTR/L  [phone] 10/08/2013

## 2013-10-08 NOTE — Progress Notes (Signed)
Aching up and down entire arm

## 2013-10-08 NOTE — Op Note (Signed)
NAMEMANNIE, WINELAND NO.:  [id number]  MEDICAL RECORD NO.:  [id number]  LOCATION:  5N10C                        FACILITY:  [facility]  PHYSICIAN:  Alta Corning, M.D.   DATE OF BIRTH:  28-Nov-1933  DATE OF PROCEDURE:  10/07/2013 DATE OF DISCHARGE:                              OPERATIVE REPORT   PREOPERATIVE DIAGNOSES: 1. Severe end-stage degenerative joint disease, right shoulder. 2. Impending biceps tendon rupture. 3. Rotator cuff tear.  POSTOPERATIVE DIAGNOSES: 1. Severe end-stage degenerative joint disease, right shoulder. 2. Impending biceps tendon rupture. 3. Rotator cuff tear.  PROCEDURE: 1. Right total shoulder replacement with a global stem size 14 body     with a 48 eccentric head placed in the posterior position. 2. A 48 mm 3-pegged glenoid cemented in the peripheral PEG's and press-     fit in the central PEG.  SURGEON:  Alta Corning, MD  ASSISTANT:  Gary Fleet, PA  ANESTHESIA:  General.  BRIEF HISTORY:  Mrs. Greb is a 80 year old female with history of having significant complaints of right shoulder pain.  She had limited motion, mobility, and chronic and significant pain.  After failure of all conservative care including physical therapy, occupational therapy, injection therapy, and activity modification, she also was taken to the operating room for right total shoulder replacement.  DESCRIPTION OF PROCEDURE:  The patient was taken to the operating room, and after adequate anesthesia was obtained, with general anesthetic, the patient was placed supine on the operating table.  The right shoulder was then prepped and draped in usual sterile fashion.  She was placed into a shoulder bed to allow for positioning to get to the right shoulder.  Once this was done, attention was turned to the right shoulder where after routine prep and drape, an incision was made from the coracoid distally subcutaneously down the level of the  deltopectoral interval which was identified and the cephalic vein was taken with the deltoid in a lateral direction.  At this point, the clavipectoral fascia was divided and the conjoined tendon was identified, there was a little bit tightish in this area and few of the fibers were released. Attention was then turned down to the pec major where the upper quarter of the pack was released and the biceps tendon was then identified and exploited up into the interval.  This biceps tendon was released and then the biceps tendon was tenodesed at the upper border of the pectoralis major border.  Once this was done, the remaining biceps stump was resected.  Attention was then turned towards the shoulder where in the bicipital groove, the saw was used to make a small way for a bone from the subscap and then the subscap was released down to the glenoid margin through the rotator interval and also distally through the body and then partially for the muscle of the subscap.  Once this was released, the tension was allowed to access to the glenoid and the anterior and posterior labrum were released as well as the inferior labrum and once this was done, the retractors were put in place to address the glenoid.  We were able to identify the central portion  of the glenoid with a 48 trial and the central guidewire was placed.  This was then over-reamed with a 3/4 reamer and then the peripheral part where the 48 reamer was used.  Once that was done, attention was turned towards drilling the central hole and then the guide was used and the peripheral holes were drilled.  Excellent bony support was achieved in this area.  Following this, these areas were irrigated, suctioned dry, and sponge was placed to continue to dry this area and at this point, cement was mixed on the back table, a small portion of cement was then placed under each of the peripheral PEG holes and a central PEG hole which had bone grafted  and pushed into it, was then advanced into the central PEG and the peripheral holes were cemented.  This was held until the cement dried and about 14 minutes.  Once this was done, attention was turned back to the stem side where the shaft was sequentially rasped up to a level of 14 and then a 14 neck body was used to get the appropriate position.  Following this, the trial body was left in place and a 48 head was used with a 48 eccentric seeming to be better and finally the final 14 body was opened and hammered into place.  A 48 eccentric head was opened and placed in the posterior position.  Again the range of motion was checked at this time, and there was a full range of motion without limitation.  At this point, the wound was copiously and thoroughly irrigated, suctioned dry.  The subscapularis was then closed with 4 drill holes through the bicipital groove.  These went around the implant and then in a Mason-Allen type fashion around the subscap and once this was completed, this was tied with the subscap being held in its original position where the osteotomy was done. Through this, stitches were tied with the arm in neutral rotation and 2 in 30 degrees of external rotation.  At this point, the wound was again irrigated.  The rotator cuff tear was identified superiorly and this was repaired with 2 figure-of-eight interrupted FiberWire sutures.  At this point, the arm was again put through range of motion, excellent range of motion and stability was achieved at this point.  At this point, again the wound was irrigated, suctioned dry.  The self-retaining retractor was removed.  The deep skin layers were then closed with 0 and 2-0 Vicryl and the skin was closed with 3-0 Monocryl subcuticular.  Benzoin and Steri-Strips were applied.  Sterile compressive dressing was applied and the patient was taken to the recovery, she noted to be in the satisfactory condition.  Estimated blood loss for  the procedure was 150 mL.     Alta Corning, M.D.     Corliss Skains  D:  10/07/2013  T:  10/08/2013  Job:  [id number]

## 2013-10-09 LAB — GLUCOSE, CAPILLARY
GLUCOSE-CAPILLARY: 168 mg/dL — AB (ref 70–99)
Glucose-Capillary: 131 mg/dL — ABNORMAL HIGH (ref 70–99)
Glucose-Capillary: 172 mg/dL — ABNORMAL HIGH (ref 70–99)

## 2013-10-09 NOTE — Progress Notes (Signed)
Clinical Social Work Department BRIEF PSYCHOSOCIAL ASSESSMENT 10/09/2013  Patient:  [name] [name], [name] [name]     Account Number:  [id number]     Admit date:  10/07/2013  Clinical Social Worker:  Rolinda Roan  Date/Time:  10/09/2013 04:36 PM  Referred by:  Physician  Date Referred:  10/07/2013 Referred for  SNF Placement   Other Referral:   Interview type:  Patient Other interview type:    PSYCHOSOCIAL DATA Living Status:  ALONE Admitted from facility:   Level of care:   Primary support name:  [name] [phone] Primary support relationship to patient:  CHILD, ADULT Degree of support available:   Very supportive per patient.    CURRENT CONCERNS  Other Concerns:    SOCIAL WORK ASSESSMENT / PLAN Clinical Social Worker (CSW) met with patient to discuss SNF placement. Patient reported that she has pre-registered at [facility]. CSW encouraged patient to think about a back up SNF because [facility] is the most requested facility and they may not have a bed available. Patient verbalized her understanding.   Assessment/plan status:  Psychosocial Support/Ongoing Assessment of Needs Other assessment/ plan:   Information/referral to community resources:   CSW gave patient SNF list.    PATIENT'S/FAMILY'S RESPONSE TO PLAN OF CARE: Patient thanked CSW for visit. Patient reported that she is agreeable to go to short term rehab and then return to her home in [place].

## 2013-10-09 NOTE — Evaluation (Signed)
Physical Therapy Evaluation Patient Details Name: [name] [name] MRN: [id number] DOB: 1934/05/11 Today's Date: 10/09/2013 Time: 9622-2979 PT Time Calculation (min): 18 min  PT Assessment / Plan / Recommendation History of Present Illness  R TSA.  Pt with h/o L THA.  Clinical Impression  Pt underwent R TSA 10-07-13.  She presented with recent falls prior to sx and further gait disturbance with shoulder surgery.  She used a RW vs QC for gait depending on her level of need.  She presents with decreased mobility, decreased balance and decreased safety awareness resulting in need for skilled PT intervention.  Recommend ST SNF at d/c due to pt lives alone.  Pt prefers [facility].    PT Assessment  Patient needs continued PT services    Follow Up Recommendations  SNF ([facility])    Does the patient have the potential to tolerate intense rehabilitation      Barriers to Discharge Decreased caregiver support      Equipment Recommendations  None recommended by PT    Recommendations for Other Services     Frequency Min 2X/week    Precautions / Restrictions Precautions Precautions: Shoulder Shoulder Interventions: Shoulder sling/immobilizer;Off for dressing/bathing/exercises Restrictions RUE Weight Bearing: Non weight bearing   Pertinent Vitals/Pain 0/10      Mobility  Bed Mobility Supine to sit: Mod assist Transfers Sit to Stand: Mod assist Stand pivot transfers: Mod assist    Exercises     PT Diagnosis: Difficulty walking;Acute pain  PT Problem List: Decreased activity tolerance;Decreased balance;Decreased mobility PT Treatment Interventions: DME instruction;Gait training;Functional mobility training;Therapeutic activities     PT Goals(Current goals can be found in the care plan section) Acute Rehab PT Goals Patient Stated Goal: walk better PT Goal Formulation: With patient Time For Goal Achievement: 10/16/13 Potential to Achieve Goals: Good  Visit Information  Last PT Received On: 10/09/13 Assistance Needed: +1 History of Present Illness: R TSA.  Pt with h/o L THA.       Prior [place] expects to be discharged to:: Skilled nursing facility Living Arrangements: Alone Additional Comments: has had a "few falls" Prior Function Level of Independence: Independent with assistive device(s) Comments: QC or RW Communication Communication: No difficulties    Cognition  Cognition Arousal/Alertness: Awake/alert Behavior During Therapy: WFL for tasks assessed/performed Overall Cognitive Status: Within Functional Limits for tasks assessed    Extremity/Trunk Assessment     Balance    End of Session PT - End of Session Activity Tolerance: Patient tolerated treatment well Patient left: in chair;with call bell/phone within reach;Other (comment) (LUE elevated on pillow) Nurse Communication: Mobility status  GP     Lorriane Shire 10/09/2013, 12:39 PM  Lorrin Goodell, PT  Office # [phone] Pager [phone]

## 2013-10-09 NOTE — Progress Notes (Signed)
Occupational Therapy Treatment Patient Details Name: [name] [name] MRN: [id number] DOB: 1933-10-03 Today's Date: 10/09/2013 Time: 2025-4270 OT Time Calculation (min): 24 min  OT Assessment / Plan / Recommendation  History of present illness R TSA.  Pt with h/o L THA.   OT comments  Pt assisted with exercises Rt. UE - able to achieve 30* shoulder FF passively today.  Pt tends to guard and attempt to assist PROM - requires max cuing   Follow Up Recommendations  SNF    Barriers to Discharge       Equipment Recommendations  None recommended by OT    Recommendations for Other Services    Frequency Min 3X/week   Progress towards OT Goals    Plan      Precautions / Restrictions Precautions Precautions: Shoulder Type of Shoulder Precautions: shoulder FF to90 PROM. no ER. elbow wirst and hand A/AAROM withint tolerance Shoulder Interventions: Shoulder sling/immobilizer;Off for dressing/bathing/exercises Required Braces or Orthoses: Sling Restrictions Weight Bearing Restrictions: Yes RUE Weight Bearing: Non weight bearing   Pertinent Vitals/Pain     ADL       OT Diagnosis:    OT Problem List:   OT Treatment Interventions:     OT Goals(current goals can now be found in the care plan section) Acute Rehab OT Goals Patient Stated Goal: walk better ADL Goals Pt Will Transfer to Toilet: with min assist;bedside commode;stand pivot transfer Pt Will Perform Toileting - Clothing Manipulation and hygiene: with min assist;sit to/from stand;with adaptive equipment Pt/caregiver will Perform Home Exercise Program: Increased ROM;Increased strength;Right Upper extremity;With minimal assist;With written HEP provided Additional ADL Goal #1: Pt I with verbalizing proper positioning and sling use RUE for caregivers  Visit Information  Last OT Received On: 10/09/13 Assistance Needed: +1 History of Present Illness: R TSA.  Pt with h/o L THA.    Subjective Data      Prior Functioning  Home  Living Family/patient expects to be discharged to:: Skilled nursing facility Living Arrangements: Alone Additional Comments: has had a "few falls" Prior Function Level of Independence: Independent with assistive device(s) Comments: QC or RW Communication Communication: No difficulties    Cognition  Cognition Arousal/Alertness: Awake/alert Behavior During Therapy: WFL for tasks assessed/performed Overall Cognitive Status: Within Functional Limits for tasks assessed    Mobility  Bed Mobility Supine to sit: Mod assist Transfers Sit to Stand: Mod assist Stand pivot transfers: Mod assist    Exercises  Shoulder Exercises Shoulder Flexion: PROM;20 reps;Seated (30*) Elbow Flexion: AAROM;Right;20 reps;Seated Elbow Extension: AAROM;Right;20 reps;Seated Wrist Flexion: Right;20 reps;Seated;AROM Wrist Extension: Right;20 reps;Seated;AROM Digit Composite Flexion: AROM;20 reps;Seated Composite Extension: AROM;Right;15 reps;Seated Neck Flexion: AROM;15 reps;Seated Neck Extension: AROM;15 reps;Seated Neck Lateral Flexion - Right: AROM;15 reps;Seated Neck Lateral Flexion - Left: AROM;15 reps;Seated Proper positioning of operated UE when showering: Maximal assistance   Balance    End of Session OT - End of Session Equipment Utilized During Treatment: Other (comment) (sling) Activity Tolerance: Patient tolerated treatment well Patient left: in chair;with call bell/phone within reach  [place], [name] [name] 10/09/2013, 3:21 PM

## 2013-10-09 NOTE — Progress Notes (Signed)
Physical Therapy Treatment Patient Details Name: [name] [name] MRN: [id number] DOB: 25-Jan-1934 Today's Date: 10/09/2013 Time: 9485-4627 PT Time Calculation (min): 26 min  PT Assessment / Plan / Recommendation  History of Present Illness R TSA.  Pt with h/o L THA.   PT Comments   Progressing well.  Continue per POC.  Follow Up Recommendations  SNF     Does the patient have the potential to tolerate intense rehabilitation     Barriers to Discharge Decreased caregiver support      Equipment Recommendations  None recommended by PT    Recommendations for Other Services    Frequency Min 2X/week   Progress towards PT Goals Progress towards PT goals: Progressing toward goals  Plan Current plan remains appropriate    Precautions / Restrictions Precautions Precautions: Shoulder Shoulder Interventions: Shoulder sling/immobilizer;Off for dressing/bathing/exercises Restrictions RUE Weight Bearing: Non weight bearing   Pertinent Vitals/Pain 2/10    Mobility  Bed Mobility Supine to sit: Mod assist Transfers Sit to Stand: Mod assist Stand pivot transfers: Mod assist Ambulation/Gait Ambulation/Gait assistance: +2 safety/equipment;Mod assist Ambulation Distance (Feet): 45 Feet Assistive device: Quad cane Gait Pattern/deviations: Decreased stride length Gait velocity interpretation: <1.8 ft/sec, indicative of risk for recurrent falls    Exercises     PT Diagnosis: Difficulty walking;Acute pain  PT Problem List: Decreased activity tolerance;Decreased balance;Decreased mobility PT Treatment Interventions: DME instruction;Gait training;Functional mobility training;Therapeutic activities   PT Goals (current goals can now be found in the care plan section) Acute Rehab PT Goals Patient Stated Goal: walk better PT Goal Formulation: With patient Time For Goal Achievement: 10/16/13 Potential to Achieve Goals: Good  Visit Information  Last PT Received On: 10/09/13 Assistance  Needed: +1 History of Present Illness: R TSA.  Pt with h/o L THA.    Subjective Data  Patient Stated Goal: walk better   Cognition  Cognition Arousal/Alertness: Awake/alert Behavior During Therapy: WFL for tasks assessed/performed Overall Cognitive Status: Within Functional Limits for tasks assessed    Balance     End of Session PT - End of Session Equipment Utilized During Treatment: Gait belt Activity Tolerance: Patient tolerated treatment well Patient left: in chair;with call bell/phone within reach Nurse Communication: Mobility status   GP     Lorriane Shire 10/09/2013, 2:31 PM  Lorrin Goodell, PT  Office # [phone] Pager [phone]

## 2013-10-09 NOTE — Progress Notes (Addendum)
Clinical Social Work Department CLINICAL SOCIAL WORK PLACEMENT NOTE 10/09/2013  Patient:  [name] [name], [name] [name]  Account Number:  [id number] Admit date:  10/07/2013  Clinical Social Worker:  Blima Rich, Latanya Presser  Date/time:  10/09/2013 04:42 PM  Clinical Social Work is seeking post-discharge placement for this patient at the following level of care:   [place]   (*CSW will update this form in Epic as items are completed)   10/09/2013  Patient/family provided with [place] Department of Clinical Social Work's list of facilities offering this level of care within the geographic area requested by the patient (or if unable, by the patient's family).  10/09/2013  Patient/family informed of their freedom to choose among providers that offer the needed level of care, that participate in Medicare, Medicaid or managed care program needed by the patient, have an available bed and are willing to accept the patient.  10/09/2013  Patient/family informed of [facility]' ownership interest in [facility], as well as of the fact that they are under no obligation to receive care at this facility.  PASARR submitted to [employer] on 11/21/2009 PASARR number received from [employer] on 11/21/2009  FL2 transmitted to all facilities in geographic area requested by pt/family on  10/09/2013 FL2 transmitted to all facilities within larger geographic area on   Patient informed that his/her managed care company has contracts with or will negotiate with  certain facilities, including the following:     Patient/family informed of bed offers received: 10/10/13  Patient chooses bed at [facility] Physician recommends and patient chooses bed at    Patient to be transferred to  on  10/10/13 Patient to be transferred to facility by [facility]  The following physician request were entered in Epic:   Additional Comments: Patient had an existing PASARR that doesn't expire.

## 2013-10-09 NOTE — Progress Notes (Signed)
Subjective: R TSA 1-23 Sitting up, hand not very elevated and a little swollen/puffy, states she is still groggy from anesthesia, but improving.  C/o about her retinal issues Ate breakfast   Objective: Vital signs in last 24 hours: Temp:  [98.2 F (36.8 C)-98.4 F (36.9 C)] 98.3 F (36.8 C) (01/25 0506) Pulse Rate:  [74-83] 83 (01/25 0506) Resp:  [16-18] 16 (01/25 0506) BP: (135-158)/(48-58) 158/54 mmHg (01/25 0506) SpO2:  [93 %-96 %] 93 % (01/25 0506)  Intake/Output from previous day: 01/24 0701 - 01/25 0700 In: 1312.5 [P.O.:600; I.V.:712.5] Out: 600 [Urine:600] Intake/Output this shift:     Recent Labs  10/08/13 0534  HGB 10.8*    Recent Labs  10/08/13 0534  WBC 7.4  RBC 3.32*  HCT 32.6*  PLT 204    Recent Labs  10/08/13 0534  NA 139  K 4.3  CL 99  CO2 23  BUN 24*  CREATININE 0.95  GLUCOSE 219*  CALCIUM 8.4   No results found for this basename: LABPT, INR,  in the last 72 hours  Dressing clean/dry Intact LT sens Ax/R/M/U Abducts fingers, flexes them, extends thumb Not asked to fire deltoid yet  Assessment/Plan: Doing well--I elevated hand better Plan to d/c to [facility] on Monday   Kemp Gomes A. 10/09/2013, 10:37 AM

## 2013-10-10 LAB — GLUCOSE, CAPILLARY
Glucose-Capillary: 103 mg/dL — ABNORMAL HIGH (ref 70–99)
Glucose-Capillary: 123 mg/dL — ABNORMAL HIGH (ref 70–99)

## 2013-10-10 NOTE — Discharge Summary (Signed)
Patient ID: [name] [name] MRN: [id number] DOB/AGE: 03/22/1934 80 y.o.  Admit date: 10/07/2013 Discharge date: 10/10/2013  Admission Diagnoses:  Principal Problem:   Osteoarthritis of right shoulder Active Problems:   Hypertension   IDDM (insulin dependent diabetes mellitus)   Obesity   Discharge Diagnoses:  Same  Past Medical History  Diagnosis Date  . Hypertension   . Depression   . CAD (coronary artery disease)   . GERD (gastroesophageal reflux disease)   . Bilateral carotid artery stenosis   . Diverticulitis   . Obesity   . Degenerative joint disease   . Hiatal hernia   . Dementia     early   . Atrial fibrillation with rapid ventricular response   . Hypothyroidism   . Type II diabetes mellitus   . Anemia   . Stroke 2011    "old very small TIA/neurologist"  . Chronic lower back pain     "back is very stiff because of fusion; can't stand up straight more than few moments"  . Bladder incontinence     Hx: of  . Complication of anesthesia     anesth. meds caused hallucinations   . Anxiety   . Endometriosis   . Memory loss   . Fall at home 05/15/2013  . Progressive dementia with uncertain etiology 2/70/3500    Suspect Lewy body .  [name] Bowel incontinence     Surgeries: Procedure(s): TOTAL SHOULDER ARTHROPLASTY on 10/07/2013   Consultants:    Discharged Condition: Improved  Hospital Course: ZIA NAJERA is an 80 y.o. female who was admitted 10/07/2013 for operative treatment ofOsteoarthritis of right shoulder. Patient has severe unremitting pain that affects sleep, daily activities, and work/hobbies. After pre-op clearance the patient was taken to the operating room on 10/07/2013 and underwent  Procedure(s):Right TOTAL SHOULDER ARTHROPLASTY.    Patient was given perioperative antibiotics: Anti-infectives   Start     Dose/Rate Route Frequency Ordered Stop   10/07/13 1730  ceFAZolin (ANCEF) IVPB 2 g/50 mL premix     2 g 100 mL/hr over 30 Minutes Intravenous Every  6 hours 10/07/13 1710 10/08/13 0528   10/07/13 0600  ceFAZolin (ANCEF) IVPB 2 g/50 mL premix     2 g 100 mL/hr over 30 Minutes Intravenous On call to O.R. 10/06/13 1402 10/07/13 1030       Patient was given sequential compression devices, early ambulation, and chemoprophylaxis to prevent DVT.  Patient benefited maximally from hospital stay and there were no complications.    Recent vital signs: Patient Vitals for the past 24 hrs:  BP Temp Temp src Pulse Resp SpO2  10/10/13 0442 146/46 mmHg 98.7 F (37.1 C) - 78 18 92 %  10/09/13 2103 174/69 mmHg 99 F (37.2 C) - 78 19 96 %  10/09/13 1805 157/47 mmHg 99.2 F (37.3 C) Oral 82 18 98 %  10/09/13 1345 147/45 mmHg 98.4 F (36.9 C) Oral 78 18 99 %  10/09/13 1045 173/57 mmHg - - 80 20 98 %     Recent laboratory studies:  Recent Labs  10/08/13 0534  WBC 7.4  HGB 10.8*  HCT 32.6*  PLT 204  NA 139  K 4.3  CL 99  CO2 23  BUN 24*  CREATININE 0.95  GLUCOSE 219*  CALCIUM 8.4     Discharge Medications:     Medication List         acetaminophen 500 MG tablet  Commonly known as:  TYLENOL  Take 500-1,000 mg by mouth  daily as needed (for arthritis pain).     ascorbic acid 1000 MG tablet  Commonly known as:  VITAMIN C  Take 1,000 mg by mouth every Monday, Wednesday, and Friday.     bimatoprost 0.03 % ophthalmic solution  Commonly known as:  LUMIGAN  Place 1 drop into both eyes at bedtime.     cholecalciferol 1000 UNITS tablet  Commonly known as:  VITAMIN D  Take 1,000 Units by mouth See admin instructions. On Monday, Tuesday, Wednesday, Friday, Saturday.     clopidogrel 75 MG tablet  Commonly known as:  PLAVIX  Take 75 mg by mouth daily with breakfast.     ergocalciferol 50000 UNITS capsule  Commonly known as:  VITAMIN D2  Take 50,000 Units by mouth 2 (two) times a week. On Thursday and Sunday     felodipine 5 MG 24 hr tablet  Commonly known as:  PLENDIL  Take 5 mg by mouth every morning.     FLUoxetine 20 MG  capsule  Commonly known as:  PROZAC  Take 20 mg by mouth every morning.     glipiZIDE 5 MG 24 hr tablet  Commonly known as:  GLUCOTROL XL  Take 5 mg by mouth at bedtime.     hydrOXYzine 25 MG tablet  Commonly known as:  ATARAX/VISTARIL  Take 25 mg by mouth every 6 (six) hours as needed for itching.     LANTUS SOLOSTAR 100 UNIT/ML Solostar Pen  Generic drug:  Insulin Glargine  Inject 10 Units into the skin at bedtime.     levothyroxine 125 MCG tablet  Commonly known as:  SYNTHROID, LEVOTHROID  Take 125 mcg by mouth daily before breakfast.     memantine 10 MG tablet  Commonly known as:  NAMENDA  Take 10 mg by mouth 2 (two) times daily.     metFORMIN 500 MG tablet  Commonly known as:  GLUCOPHAGE  Take 500 mg by mouth 2 (two) times daily with a meal.     metoprolol tartrate 25 MG tablet  Commonly known as:  LOPRESSOR  Take 25 mg by mouth 2 (two) times daily.     mirabegron ER 50 MG Tb24 tablet  Commonly known as:  MYRBETRIQ  Take 50 mg by mouth every evening.     omeprazole 20 MG capsule  Commonly known as:  PRILOSEC  Take 20 mg by mouth daily.     oxyCODONE-acetaminophen 5-325 MG per tablet  Commonly known as:  PERCOCET/ROXICET  Take 1 tablet by mouth every 8 (eight) hours as needed for severe pain.     pioglitazone 30 MG tablet  Commonly known as:  ACTOS  Take 30 mg by mouth every morning.     pravastatin 40 MG tablet  Commonly known as:  PRAVACHOL  Take 40 mg by mouth every evening.     QUEtiapine 25 MG tablet  Commonly known as:  SEROQUEL  Take 1 tablet (25 mg total) by mouth at bedtime. May repeat once if needed     traMADol 50 MG tablet  Commonly known as:  ULTRAM  Take 50 mg by mouth every 6 (six) hours as needed for pain.        Diagnostic Studies: Dg Chest 2 View  09/27/2013   CLINICAL DATA:  Preoperative for shoulder surgery, history of previous CABG, hypertension, and diabetes, previous history of tobacco use as well.  EXAM: CHEST  2 VIEW   COMPARISON:  PA and lateral chest x-ray of Jan 20, 2013.  FINDINGS: The lungs are mildly hyperinflated. The interstitial markings are mildly increased but stable and likely reflect the patient's smoking history. The cardiopericardial silhouette is top-normal in size. The pulmonary vascularity is not engorged. The patient has undergone previous CABG and 6 intact sternal wires are visible. There is no pleural effusion or pneumothorax. There are mild degenerative disc changes of the thoracic spine. There is degenerative change of the right shoulder.  IMPRESSION: 1. There are mild changes of COPD and pulmonary fibrosis. There is no evidence of pneumonia. 2. There is no evidence of CHF or pleural effusion.   Electronically Signed   By: David  Martinique   On: 09/27/2013 16:11   Dg Shoulder Right  10/07/2013   CLINICAL DATA:  Right shoulder replacement.  EXAM: RIGHT SHOULDER - 2+ VIEW  COMPARISON:  11/12/2010 and 09/27/2013  FINDINGS: Single view demonstrates a right shoulder hemiarthroplasty. No evidence for a periprosthetic fracture. The right AC joint is intact. There are right basilar densities which may be related to atelectasis or dependent edema.  IMPRESSION: Right shoulder arthroplasty without complicating features.  Right basilar chest densities.   Electronically Signed   By: Markus Daft M.D.   On: 10/07/2013 16:07    Disposition: skilled nursing facility      Discharge Orders   Future Appointments Provider Department Dept Phone   11/07/2013 2:00 PM Hayden Pedro, MD [place] [phone]   12/07/2013 3:00 PM Casandra Doffing, MD [facility] [phone]   02/21/2014 3:00 PM Larey Seat, MD [facility] [phone]   Future Orders Complete By Expires   Constipation Prevention  As directed    Comments:     Drink plenty of fluids.  Prune juice may be helpful.  You may use a stool softener, such as Colace (over the counter) 100 mg twice a day.   Use MiraLax (over the counter) for constipation as needed.   Diet Carb Modified  As directed    Increase activity slowly as tolerated  As directed    Scheduling Instructions:     Wear shoulder immobilizer except when getting PT Avoid external rotation.      Follow-up Information   Follow up with GRAVES,JOHN L, MD. Schedule an appointment as soon as possible for a visit in 2 weeks.   Specialty:  Orthopedic Surgery   Contact information:   [place] [address] [phone]        Signed: Erlene Senters 10/10/2013, 8:24 AM

## 2013-10-10 NOTE — Progress Notes (Signed)
OT Cancellation Note  Patient Details Name: DANAYSIA RADER MRN: [id number] DOB: 08/06/1934   Cancelled Treatment:     Reason Eval/treatment not performed: Pt with d/c summary in chart w/ orders for d/c to SNF rehab today. Defer further OT & progression of R TSA to SNF facility, will sign off at this time.Carlynn Herald, Amy Beth Dixon 10/10/2013, 8:54 AM

## 2013-10-10 NOTE — Progress Notes (Signed)
Patient d/c'd to [facility] via EMS. No s/sx of acute distress. Report given to admission nurse at [facility].

## 2013-10-10 NOTE — Progress Notes (Signed)
Subjective: 3 Days Post-Op Procedure(s) (LRB): TOTAL SHOULDER ARTHROPLASTY (Right) Patient reports pain as 3 on 0-10 scale.  Taking po/voiding ok.  Objective: Vital signs in last 24 hours: Temp:  [98.4 F (36.9 C)-99.2 F (37.3 C)] 98.7 F (37.1 C) (01/26 0442) Pulse Rate:  [78-85] 85 (01/26 0442) Resp:  [18-20] 18 (01/26 0442) BP: (146-174)/(45-69) 146/46 mmHg (01/26 0442) SpO2:  [92 %-99 %] 92 % (01/26 0442)  Intake/Output from previous day: 01/25 0701 - 01/26 0700 In: 480 [P.O.:480] Out: -  Intake/Output this shift:     Recent Labs  10/08/13 0534  HGB 10.8*    Recent Labs  10/08/13 0534  WBC 7.4  RBC 3.32*  HCT 32.6*  PLT 204    Recent Labs  10/08/13 0534  NA 139  K 4.3  CL 99  CO2 23  BUN 24*  CREATININE 0.95  GLUCOSE 219*  CALCIUM 8.4   Right shoulder exam: Neurovascular intact Sensation intact distally Intact pulses distally Incision: dressing C/D/I No cellulitis present  Assessment/Plan: 3 Days Post-Op Procedure(s) (LRB): TOTAL SHOULDER ARTHROPLASTY (Right) Plan: Discharge to SNF today. F/U Dr Berenice Primas in 2 weeks.  Jalea Bronaugh G 10/10/2013, 8:19 AM

## 2013-10-11 ENCOUNTER — Encounter (HOSPITAL_COMMUNITY): Payer: Self-pay | Admitting: Orthopedic Surgery

## 2013-10-12 ENCOUNTER — Ambulatory Visit: Payer: Medicare Other | Admitting: Neurology

## 2013-10-19 ENCOUNTER — Non-Acute Institutional Stay (SKILLED_NURSING_FACILITY): Payer: Medicare Other | Admitting: Internal Medicine

## 2013-10-19 DIAGNOSIS — E119 Type 2 diabetes mellitus without complications: Secondary | ICD-10-CM

## 2013-10-19 DIAGNOSIS — M19011 Primary osteoarthritis, right shoulder: Secondary | ICD-10-CM

## 2013-10-19 DIAGNOSIS — IMO0001 Reserved for inherently not codable concepts without codable children: Secondary | ICD-10-CM

## 2013-10-19 DIAGNOSIS — M19019 Primary osteoarthritis, unspecified shoulder: Secondary | ICD-10-CM

## 2013-10-19 DIAGNOSIS — E039 Hypothyroidism, unspecified: Secondary | ICD-10-CM

## 2013-10-19 DIAGNOSIS — Z794 Long term (current) use of insulin: Secondary | ICD-10-CM

## 2013-10-19 DIAGNOSIS — I10 Essential (primary) hypertension: Secondary | ICD-10-CM

## 2013-10-30 DIAGNOSIS — E039 Hypothyroidism, unspecified: Secondary | ICD-10-CM | POA: Insufficient documentation

## 2013-10-30 NOTE — Progress Notes (Signed)
HISTORY & PHYSICAL  DATE: 10/19/2013   FACILITY: [facility]  LEVEL OF CARE: SNF (31)  ALLERGIES:  Allergies  Allergen Reactions  . Vicodin [Hydrocodone-Acetaminophen] Other (See Comments)    Hallucinations - do NOT verify Vicodin orders  . Polysaccharide Iron Complex Hives, Itching and Rash    Patient can tolerate Ferrous Sulfate  . Amitriptyline     hallucinations  . Vesicare [Solifenacin]     hallucinations    CHIEF COMPLAINT:   Right shoulder OA, HTN & DM  HISTORY OF PRESENT ILLNESS: pt is a 80 year old Caucasian female:  SHOULDER OA: The patient was having severe osteoarthritis of the shoulder. The patient underwent total shoulder arthroplasty and tolerated the procedure well. Patient denies ongoing shoulder pain. No complications reported from the current pain medications being used. The patient was admitted to this facility for short-term rehabilitation.  Her RUE is in a sling.  HTN: Pt 's HTN remains stable.  Denies CP, sob, DOE, pedal edema, headaches, dizziness or visual disturbances.  No complications from the medications currently being used.  Last BP :148/80.  DM:pt's DM remains stable.  Pt denies polyuria, polydipsia, polyphagia, changes in vision or hypoglycemic episodes.  No complications noted from the medication presently being used.  Last hemoglobin A1c is: not available.  PAST MEDICAL HISTORY :  Past Medical History  Diagnosis Date  . Hypertension   . Depression   . CAD (coronary artery disease)   . GERD (gastroesophageal reflux disease)   . Bilateral carotid artery stenosis   . Diverticulitis   . Obesity   . Degenerative joint disease   . Hiatal hernia   . Dementia     early   . Atrial fibrillation with rapid ventricular response   . Hypothyroidism   . Type II diabetes mellitus   . Anemia   . Stroke 2011    "old very small TIA/neurologist"  . Chronic lower back pain     "back is very stiff because of fusion;  can't stand up straight more than few moments"  . Bladder incontinence     Hx: of  . Complication of anesthesia     anesth. meds caused hallucinations   . Anxiety   . Endometriosis   . Memory loss   . Fall at home 05/15/2013  . Progressive dementia with uncertain etiology [id number]    Suspect Lewy body .  [name] Bowel incontinence     PAST SURGICAL HISTORY: Past Surgical History  Procedure Laterality Date  . Ptca  03/10/12    LLE  . Coronary artery bypass graft  04/17/2011    LT  internal mammary artery to left anterior descending, saphenous vein graft first diagonal, saphenous  vein graft to obtuse marginal 1, saphenous vein graft to posterior descending  . Cholecystectomy    . Posterior fusion lumbar spine      "removed L4-5, S1"  . Total knee arthroplasty  ~ 2004    right  . Partial hip arthroplasty  11/2009    left; "just the ball"  . Cataract extraction w/ intraocular lens  implant, bilateral    . Neuroplasty / transposition median nerve at carpal tunnel bilateral    . Hand surgery      "for arthritis; right hand"  . Joint replacement    . Dilation and curettage of uterus      "for endometrosis"  . Scleral buckle Right 01/20/2013    Procedure: SCLERAL BUCKLE;  Surgeon: Hayden Pedro, MD;  Location: [facility];  Service: Ophthalmology;  Laterality: Right;  . Laser photo ablation Right 01/20/2013    Procedure: LASER PHOTO ABLATION;  Surgeon: Hayden Pedro, MD;  Location: [facility];  Service: Ophthalmology;  Laterality: Right;  . Back surgery      X2  . Carpal tunnel release Bilateral   . Dilation and curettage of uterus      ENDOMETRIOSIS  . Total shoulder arthroplasty Right 10/07/2013    Procedure: TOTAL SHOULDER ARTHROPLASTY;  Surgeon: Alta Corning, MD;  Location: [facility];  Service: Orthopedics;  Laterality: Right;    SOCIAL HISTORY:  reports that she quit smoking about 26 years ago. Her smoking use included Cigarettes. She smoked 1.50 packs per day. She has never used smokeless  tobacco. She reports that she does not drink alcohol or use illicit drugs.  FAMILY HISTORY:  Family History  Problem Relation Age of Onset  . Cancer Paternal Aunt   . Cancer Cousin     CURRENT MEDICATIONS: Reviewed per [facility]  REVIEW OF SYSTEMS:  See HPI otherwise 14 point ROS is negative.  PHYSICAL EXAMINATION  VS:  T 97.3       P69       RR 18       BP 148/80       POX% 98  GENERAL: no acute distress, moderately obese body habitus EYES: conjunctivae normal, sclerae normal, normal eye lids MOUTH/THROAT: lips without lesions,no lesions in the mouth,tongue is without lesions,uvula elevates in midline NECK: supple, trachea midline, no neck masses, no thyroid tenderness, no thyromegaly LYMPHATICS: no LAN in the neck, no supraclavicular LAN RESPIRATORY: breathing is even & unlabored, BS CTAB CARDIAC: RRR, no murmur,no extra heart sounds, no edema GI:  ABDOMEN: abdomen soft, normal BS, no masses, no tenderness  LIVER/SPLEEN: no hepatomegaly, no splenomegaly MUSCULOSKELETAL: HEAD: normal to inspection & palpation BACK: no kyphosis, scoliosis or spinal processes tenderness EXTREMITIES: LEFT UPPER EXTREMITY: full range of motion, normal strength & tone RIGHT UPPER EXTREMITY: in a sling LEFT LOWER EXTREMITY:  Moderate range of motion, normal strength & tone RIGHT LOWER EXTREMITY:  Moderate range of motion, normal strength & tone PSYCHIATRIC: the patient is alert & oriented to person, affect & behavior appropriate  LABS/RADIOLOGY:  Labs reviewed: Basic Metabolic Panel:  Recent Labs  01/20/13 1048 09/27/13 1428 10/08/13 0534  NA 142 143 139  K 4.2 4.6 4.3  CL 106 105 99  CO2 28 26 23   GLUCOSE 86 108* 219*  BUN 29* 33* 24*  CREATININE 1.13* 1.01 0.95  CALCIUM 9.4 9.5 8.4   Liver Function Tests:  Recent Labs  09/27/13 1428  AST 15  ALT 11  ALKPHOS 91  BILITOT 0.3  PROT 7.3  ALBUMIN 3.7   CBC:  Recent Labs  01/20/13 1048 09/27/13 1428 10/08/13 0534  WBC 6.3  7.7 7.4  NEUTROABS  --  5.1  --   HGB 12.0 13.5 10.8*  HCT 35.5* 40.1 32.6*  MCV 96.2 98.8 98.2  PLT 201 221 204   CBG:  Recent Labs  10/09/13 1652 10/10/13 0638 10/10/13 1131  GLUCAP 172* 103* 123*    CHEST  2 VIEW   COMPARISON:  PA and lateral chest x-ray of Jan 20, 2013.   FINDINGS: The lungs are mildly hyperinflated. The interstitial markings are mildly increased but stable and likely reflect the patient's smoking history. The cardiopericardial silhouette is top-normal in size. The pulmonary vascularity is not engorged. The patient  has undergone previous CABG and 6 intact sternal wires are visible. There is no pleural effusion or pneumothorax. There are mild degenerative disc changes of the thoracic spine. There is degenerative change of the right shoulder.   IMPRESSION: 1. There are mild changes of COPD and pulmonary fibrosis. There is no evidence of pneumonia. 2. There is no evidence of CHF or pleural effusion.   RIGHT SHOULDER - 2+ VIEW   COMPARISON:  11/12/2010 and 09/27/2013   FINDINGS: Single view demonstrates a right shoulder hemiarthroplasty. No evidence for a periprosthetic fracture. The right AC joint is intact. There are right basilar densities which may be related to atelectasis or dependent edema.   IMPRESSION: Right shoulder arthroplasty without complicating features.   Right basilar chest densities.   CHEST  2 VIEW   COMPARISON:  PA and lateral chest x-ray of Jan 20, 2013.   FINDINGS: The lungs are mildly hyperinflated. The interstitial markings are mildly increased but stable and likely reflect the patient's smoking history. The cardiopericardial silhouette is top-normal in size. The pulmonary vascularity is not engorged. The patient has undergone previous CABG and 6 intact sternal wires are visible. There is no pleural effusion or pneumothorax. There are mild degenerative disc changes of the thoracic spine. There is degenerative change  of the right shoulder.   IMPRESSION: 1. There are mild changes of COPD and pulmonary fibrosis. There is no evidence of pneumonia. 2. There is no evidence of CHF or pleural effusion.   RIGHT SHOULDER - 2+ VIEW   COMPARISON:  11/12/2010 and 09/27/2013   FINDINGS: Single view demonstrates a right shoulder hemiarthroplasty. No evidence for a periprosthetic fracture. The right AC joint is intact. There are right basilar densities which may be related to atelectasis or dependent edema.   IMPRESSION: Right shoulder arthroplasty without complicating features.   Right basilar chest densities.    ASSESSMENT/PLAN:  Right shoulder OA-s/p arthroplasty.  Cont. Rehabilitation. HTN-BP borderline.  Will monitor. DM-cont. Current meds. Hypothyroidism-cont. Levothyroxine. Dementia-stable. Check cbc  I have reviewed patient's medical records received at admission/from hospitalization.  CPT CODE: 37628

## 2013-11-07 ENCOUNTER — Ambulatory Visit (INDEPENDENT_AMBULATORY_CARE_PROVIDER_SITE_OTHER): Payer: Medicare Other | Admitting: Ophthalmology

## 2013-11-11 ENCOUNTER — Encounter: Payer: Self-pay | Admitting: *Deleted

## 2013-11-13 NOTE — Progress Notes (Signed)
Clinical social worker assisted with patient discharge to skilled nursing facility, [facility].  CSW addressed all family questions and concerns. CSW copied chart and added all important documents. CSW also set up patient transportation with [facility]. Clinical Social Worker will sign off for now as social work intervention is no longer needed.   Midge Momon, MSW, LCSWA [phone] 

## 2013-12-07 ENCOUNTER — Ambulatory Visit: Payer: Medicare Other | Admitting: Interventional Cardiology

## 2013-12-08 ENCOUNTER — Encounter: Payer: Self-pay | Admitting: Adult Health

## 2013-12-08 ENCOUNTER — Non-Acute Institutional Stay (SKILLED_NURSING_FACILITY): Payer: Medicare Other | Admitting: Adult Health

## 2013-12-08 DIAGNOSIS — I10 Essential (primary) hypertension: Secondary | ICD-10-CM

## 2013-12-08 DIAGNOSIS — K5900 Constipation, unspecified: Secondary | ICD-10-CM

## 2013-12-08 DIAGNOSIS — I2510 Atherosclerotic heart disease of native coronary artery without angina pectoris: Secondary | ICD-10-CM

## 2013-12-08 DIAGNOSIS — K219 Gastro-esophageal reflux disease without esophagitis: Secondary | ICD-10-CM

## 2013-12-08 DIAGNOSIS — E118 Type 2 diabetes mellitus with unspecified complications: Secondary | ICD-10-CM

## 2013-12-08 DIAGNOSIS — E785 Hyperlipidemia, unspecified: Secondary | ICD-10-CM | POA: Insufficient documentation

## 2013-12-08 DIAGNOSIS — M19011 Primary osteoarthritis, right shoulder: Secondary | ICD-10-CM

## 2013-12-08 DIAGNOSIS — M19019 Primary osteoarthritis, unspecified shoulder: Secondary | ICD-10-CM

## 2013-12-08 DIAGNOSIS — F3289 Other specified depressive episodes: Secondary | ICD-10-CM

## 2013-12-08 DIAGNOSIS — F0390 Unspecified dementia without behavioral disturbance: Secondary | ICD-10-CM

## 2013-12-08 DIAGNOSIS — IMO0002 Reserved for concepts with insufficient information to code with codable children: Secondary | ICD-10-CM

## 2013-12-08 DIAGNOSIS — E1151 Type 2 diabetes mellitus with diabetic peripheral angiopathy without gangrene: Secondary | ICD-10-CM | POA: Insufficient documentation

## 2013-12-08 DIAGNOSIS — F329 Major depressive disorder, single episode, unspecified: Secondary | ICD-10-CM

## 2013-12-08 DIAGNOSIS — F32A Depression, unspecified: Secondary | ICD-10-CM

## 2013-12-08 DIAGNOSIS — E039 Hypothyroidism, unspecified: Secondary | ICD-10-CM

## 2013-12-08 DIAGNOSIS — E1165 Type 2 diabetes mellitus with hyperglycemia: Secondary | ICD-10-CM

## 2013-12-08 HISTORY — DX: Type 2 diabetes mellitus with diabetic peripheral angiopathy without gangrene: E11.51

## 2013-12-08 HISTORY — DX: Unspecified dementia, unspecified severity, without behavioral disturbance, psychotic disturbance, mood disturbance, and anxiety: F03.90

## 2013-12-08 HISTORY — DX: Constipation, unspecified: K59.00

## 2013-12-08 HISTORY — DX: Hyperlipidemia, unspecified: E78.5

## 2013-12-08 NOTE — Progress Notes (Signed)
Patient ID: [name] [name], female   DOB: 12/29/1933, 80 y.o.   MRN: [id number]              PROGRESS NOTE  DATE: 12/08/2013   FACILITY: [facility]  LEVEL OF CARE: SNF (31)  Acute Visit  CHIEF COMPLAINT:  Discharge Notes  HISTORY OF PRESENT ILLNESS:  This is a 80 year old female patient who is for discharge home with Home health PT, Nursing, OT, Social worker and CNA. She has been admitted to [facility] on 10/10/13 from [facility] with Osteoarthritis S/P right total shoulder arthroplasty. Patient was admitted to this facility for short-term rehabilitation after the patient's recent hospitalization.  Patient has completed SNF rehabilitation and therapy has cleared the patient for discharge.  Reassessment of ongoing problem(s):  HTN: Pt 's HTN remains stable.  Denies CP, sob, DOE, pedal edema, headaches, dizziness or visual disturbances.  No complications from the medications currently being used.  Last BP :121/72  GERD: pt's GERD is stable.  Denies ongoing heartburn, abd. Pain, nausea or vomiting.  Currently on a PPI & tolerates it without any adverse reactions.  DEMENTIA: The dementia remaines stable and continues to function adequately in the current living environment with supervision.  The patient has had little changes in behavior. No complications noted from the medications presently being used.  PAST MEDICAL HISTORY : Reviewed.  No changes.  CURRENT MEDICATIONS: Reviewed per [facility]  REVIEW OF SYSTEMS:  GENERAL: no change in appetite, no fatigue, no weight changes, no fever, chills or weakness RESPIRATORY: no cough, SOB, DOE, wheezing, hemoptysis CARDIAC: no chest pain, edema or palpitations GI: no abdominal pain, diarrhea, constipation, heart burn, nausea or vomiting  PHYSICAL EXAMINATION  GENERAL: no acute distress, normal body habitus EYES: conjunctivae normal, sclerae normal, normal eye lids NECK: supple, trachea midline, no neck masses, no  thyroid tenderness, no thyromegaly LYMPHATICS: no LAN in the neck, no supraclavicular LAN RESPIRATORY: breathing is even & unlabored, BS CTAB CARDIAC: RRR, no murmur,no extra heart sounds, no edema GI: abdomen soft, normal BS, no masses, no tenderness, no hepatomegaly, no splenomegaly PSYCHIATRIC: the patient is alert & oriented to person, affect & behavior appropriate  LABS/RADIOLOGY: 10/20/13  WBC 7.6 hemoglobin 11.3 hematocrit 37.9 Labs reviewed: Basic Metabolic Panel:  Recent Labs  01/20/13 1048 09/27/13 1428 10/08/13 0534  NA 142 143 139  K 4.2 4.6 4.3  CL 106 105 99  CO2 28 26 23   GLUCOSE 86 108* 219*  BUN 29* 33* 24*  CREATININE 1.13* 1.01 0.95  CALCIUM 9.4 9.5 8.4   Liver Function Tests:  Recent Labs  09/27/13 1428  AST 15  ALT 11  ALKPHOS 91  BILITOT 0.3  PROT 7.3  ALBUMIN 3.7   CBC:  Recent Labs  01/20/13 1048 09/27/13 1428 10/08/13 0534  WBC 6.3 7.7 7.4  NEUTROABS  --  5.1  --   HGB 12.0 13.5 10.8*  HCT 35.5* 40.1 32.6*  MCV 96.2 98.8 98.2  PLT 201 221 204    CBG:  Recent Labs  10/09/13 1652 10/10/13 0638 10/10/13 1131  GLUCAP 172* 103* 123*    ASSESSMENT/PLAN:  Osteoarthritis status post right total shoulder arthroplasty - for home health PT, OT, nursing, CNA and social worker Hypertension - well controlled; continue Plendil and Lopressor Diabetes mellitus, type II - continue Glucotrol, Lantus, Glucophage and Actos Depression - continue Seroquel and Prozac GERD - continue Protonix Constipation - continue Miralax and Constipation Hyperlipidemia - continue Lipitor CAD -  continue Plavix Hypothyroidism - continue Synthroid Dementia - continue Namenda    I have filled out patient's discharge paperwork and written prescriptions.  Patient will receive home health PT, OT, Social worker, Nursing and CNA.   Total discharge time: Greater than 30 minutes Discharge time involved coordination of the discharge process with social worker,  nursing staff and therapy department. Medical justification for home health services verified.  CPT CODE: 85462  Seth Bake - NP [facility] [phone]

## 2013-12-14 ENCOUNTER — Encounter: Payer: Self-pay | Admitting: Internal Medicine

## 2013-12-14 ENCOUNTER — Non-Acute Institutional Stay (SKILLED_NURSING_FACILITY): Payer: Medicare Other | Admitting: Internal Medicine

## 2013-12-14 DIAGNOSIS — J209 Acute bronchitis, unspecified: Secondary | ICD-10-CM

## 2013-12-14 DIAGNOSIS — R059 Cough, unspecified: Secondary | ICD-10-CM

## 2013-12-14 DIAGNOSIS — R05 Cough: Secondary | ICD-10-CM

## 2013-12-14 NOTE — Progress Notes (Signed)
MRN: [id number] Name: [name] [name]  Sex: female Age: 80 y.o. DOB: 1933/10/22  [place] #: [name] [name] Facility/Room: [name] Level Of Care: SNF Provider: Inocencio Homes [name] Emergency Contacts: Extended Emergency Contact Information Primary Emergency Contact: Streeter,[name] [name] Address: [address]          [place], [place] [address] [place] Phone: [phone] Work Phone: [phone] Mobile Phone: [phone] Relation: Daughter Secondary Emergency Contact: [name] [name], [place] Phone: [phone] Work Phone: [phone] Mobile Phone: [phone] Relation: Daughter    Allergies: Vicodin; Polysaccharide iron complex; Amitriptyline; and Vesicare  Chief Complaint  Patient presents with  . Acute Visit    HPI: Patient is 80 y.o. female who has c/o URI.  Past Medical History  Diagnosis Date  . Hypertension   . Depression   . CAD (coronary artery disease)   . GERD (gastroesophageal reflux disease)   . Bilateral carotid artery stenosis   . Diverticulitis   . Obesity   . Degenerative joint disease   . Hiatal hernia   . Dementia     early   . Atrial fibrillation with rapid ventricular response   . Hypothyroidism   . Type II diabetes mellitus   . Anemia   . Stroke 2011    "old very small TIA/neurologist"  . Chronic lower back pain     "back is very stiff because of fusion; can't stand up straight more than few moments"  . Bladder incontinence     Hx: of  . Complication of anesthesia     anesth. meds caused hallucinations   . Anxiety   . Endometriosis   . Memory loss   . Fall at home 05/15/2013  . Progressive dementia with uncertain etiology [id number]    Suspect Lewy body .  [name] Bowel incontinence   . Other and unspecified hyperlipidemia   . Unspecified hypothyroidism   . Anxiety state, unspecified   . Unspecified glaucoma   . Reflux esophagitis   . Diverticulitis of colon (without mention of hemorrhage)   .  Hypertonicity of bladder   . Osteoarthrosis, unspecified whether generalized or localized, unspecified site   . Lumbago   . Muscle weakness (generalized)   . Closed fracture of unspecified part of neck of femur   . Female stress incontinence   . Depressive disorder, not elsewhere classified     Past Surgical History  Procedure Laterality Date  . Ptca  03/10/12    LLE  . Coronary artery bypass graft  04/17/2011    LT  internal mammary artery to left anterior descending, saphenous vein graft first diagonal, saphenous  vein graft to obtuse marginal 1, saphenous vein graft to posterior descending  . Cholecystectomy    . Posterior fusion lumbar spine      "removed L4-5, S1"  . Total knee arthroplasty  ~ 2004    right  . Partial hip arthroplasty  11/2009    left; "just the ball"  . Cataract extraction w/ intraocular lens  implant, bilateral    . Neuroplasty / transposition median nerve at carpal tunnel bilateral    . Hand surgery      "for arthritis; right hand"  . Joint replacement    . Dilation and curettage of uterus      "for endometrosis"  . Scleral buckle Right 01/20/2013    Procedure: SCLERAL BUCKLE;  Surgeon: [name] Pedro, MD;  Location: [facility];  Service: Ophthalmology;  Laterality: Right;  . Laser photo ablation Right 01/20/2013    Procedure: LASER PHOTO ABLATION;  Surgeon: [name] Pedro, MD;  Location: [facility];  Service: Ophthalmology;  Laterality: Right;  . Back surgery      X2  . Carpal tunnel release Bilateral   . Dilation and curettage of uterus      ENDOMETRIOSIS  . Total shoulder arthroplasty Right 10/07/2013    Procedure: TOTAL SHOULDER ARTHROPLASTY;  Surgeon: Alta Corning, MD;  Location: [facility];  Service: Orthopedics;  Laterality: Right;      Medication List       This list is accurate as of: 12/14/13 11:34 PM.  Always use your most recent med list.               acetaminophen 500 MG tablet  Commonly known as:  TYLENOL  Take 500-1,000 mg by mouth daily as  needed (for arthritis pain).     ascorbic acid 1000 MG tablet  Commonly known as:  VITAMIN C  Take 1,000 mg by mouth every Monday, Wednesday, and Friday.     atorvastatin 10 MG tablet  Commonly known as:  LIPITOR  Take 10 mg by mouth daily.     bimatoprost 0.01 % Soln  Commonly known as:  LUMIGAN  Place 1 drop into both eyes at bedtime.     cholecalciferol 1000 UNITS tablet  Commonly known as:  VITAMIN [name]  Take 1,000 Units by mouth See admin instructions. On Monday, Tuesday, Wednesday, Friday, Saturday.     clopidogrel 75 MG tablet  Commonly known as:  PLAVIX  Take 75 mg by mouth daily with breakfast.     docusate sodium 100 MG capsule  Commonly known as:  COLACE  Take 100 mg by mouth 2 (two) times daily.     ergocalciferol 50000 UNITS capsule  Commonly known as:  VITAMIN D2  Take 50,000 Units by mouth 2 (two) times a week. On Thursday and Sunday     felodipine 10 MG 24 hr tablet  Commonly known as:  PLENDIL  Take 10 mg by mouth daily.     FLUoxetine 20 MG capsule  Commonly known as:  PROZAC  Take 20 mg by mouth every morning.     glipiZIDE 5 MG 24 hr tablet  Commonly known as:  GLUCOTROL XL  Take 5 mg by mouth at bedtime.     hydrOXYzine 25 MG tablet  Commonly known as:  ATARAX/VISTARIL  Take 25 mg by mouth every 6 (six) hours as needed for itching.     LANTUS SOLOSTAR 100 UNIT/ML Solostar Pen  Generic drug:  Insulin Glargine  Inject 10 Units into the skin at bedtime.     levothyroxine 125 MCG tablet  Commonly known as:  SYNTHROID, LEVOTHROID  Take 125 mcg by mouth daily before breakfast.     Melatonin 3 MG Tabs  Take 3 mg by mouth at bedtime as needed.     memantine 10 MG tablet  Commonly known as:  NAMENDA  Take 10 mg by mouth 2 (two) times daily.     metFORMIN 500 MG tablet  Commonly known as:  GLUCOPHAGE  Take 500 mg by mouth 2 (two) times daily with a meal.     metoprolol tartrate 25 MG tablet  Commonly known as:  LOPRESSOR  Take 25 mg by mouth  2 (two) times daily.     mirabegron ER 50 MG Tb24 tablet  Commonly known as:  MYRBETRIQ  Take 50 mg  by mouth every evening.     oxyCODONE-acetaminophen 5-325 MG per tablet  Commonly known as:  PERCOCET/ROXICET  Take 1 tablet by mouth every 8 (eight) hours as needed for severe pain.     pantoprazole 40 MG tablet  Commonly known as:  PROTONIX  Take 40 mg by mouth daily.     pioglitazone 30 MG tablet  Commonly known as:  ACTOS  Take 30 mg by mouth every morning.     polyethylene glycol packet  Commonly known as:  MIRALAX / GLYCOLAX  Take 17 g by mouth daily. Mix 17 gm in 4-8 oz of liquid daily     QUEtiapine 25 MG tablet  Commonly known as:  SEROQUEL  Take 1 tablet (25 mg total) by mouth at bedtime. May repeat once if needed     traMADol 50 MG tablet  Commonly known as:  ULTRAM  Take 50 mg by mouth every 6 (six) hours as needed for pain.        No orders of the defined types were placed in this encounter.    Immunization History  Administered Date(s) Administered  . Influenza Whole 07/08/2010  . PPD Test 10/24/2013  . Pneumococcal Polysaccharide-23 10/08/2013    History  Substance Use Topics  . Smoking status: Former Smoker -- 1.50 packs/day    Types: Cigarettes    Quit date: 09/16/1987  . Smokeless tobacco: Never Used  . Alcohol Use: No    Review of Systems  DATA OBTAINED: from patient, nurse GENERAL:  no fevers, fatigue, appetite changes SKIN: No itching, rash HEENT: No complaint RESPIRATORY: + cough, not productive wheezing when she INHALES,No SOB;feels like bronchitis CARDIAC: No chest pain, palpitations, lower extremity edema  GI: No abdominal pain, No N/V/[name] or constipation, No heartburn or reflux  GU: No dysuria, frequency or urgency, or incontinence  MUSCULOSKELETAL: No unrelieved bone/joint pain NEUROLOGIC: No headache, dizziness or focal weakness PSYCHIATRIC: No overt anxiety or sadness. Sleeps well.   Filed Vitals:   12/14/13 1353  BP: 179/73   Pulse: 81  Temp: 97 F (36.1 C)  Resp: 20    Physical Exam  GENERAL APPEARANCE: Alert, conversant. Appropriately groomed. No acute distress  SKIN: No diaphoresis rash, HEENT: Unremarkable RESPIRATORY: Breathing is even, unlabored. Lung sounds are clear ; no wheezing , no rhonchi  CARDIOVASCULAR: Heart RRR no murmurs, rubs or gallops. No peripheral edema  GASTROINTESTINAL: Abdomen is soft, non-tender, not distended w/ normal bowel sounds.  GENITOURINARY: Bladder non tender, not distended  MUSCULOSKELETAL: No abnormal joints or musculature NEUROLOGIC: Cranial nerves 2-12 grossly intact. Moves all extremities no tremor. PSYCHIATRIC: Mood and affect appropriate to situation, no behavioral issues  Patient Active Problem List   Diagnosis Date Noted  . Type II or unspecified type diabetes mellitus with unspecified complication, uncontrolled 12/08/2013  . Constipation 12/08/2013  . Hyperlipemia 12/08/2013  . Dementia 12/08/2013  . Unspecified hypothyroidism 10/30/2013  . Osteoarthritis of right shoulder 10/07/2013  . Fall at home 05/15/2013  . Moderate vision impairment-both eyes 05/15/2013  . Progressive dementia with uncertain etiology 07/37/1062  . Obesity (BMI 30-39.9) 05/15/2013  . Rhegmatogenous retinal detachment of right eye 01/10/2013  . Hypertension   . Thyroid disease   . Depression   . IDDM (insulin dependent diabetes mellitus)   . GERD (gastroesophageal reflux disease)   . CAD (coronary artery disease)   . Bilateral carotid artery stenosis   . Obesity   . Degenerative joint disease   . Hiatal hernia   . Atrial  fibrillation with rapid ventricular response     CBC    Component Value Date/Time   WBC 7.4 10/08/2013 0534   RBC 3.32* 10/08/2013 0534   HGB 10.8* 10/08/2013 0534   HCT 32.6* 10/08/2013 0534   PLT 204 10/08/2013 0534   MCV 98.2 10/08/2013 0534   LYMPHSABS 1.5 09/27/2013 1428   MONOABS 0.8 09/27/2013 1428   EOSABS 0.3 09/27/2013 1428   BASOSABS 0.0  09/27/2013 1428    CMP     Component Value Date/Time   NA 139 10/08/2013 0534   K 4.3 10/08/2013 0534   CL 99 10/08/2013 0534   CO2 23 10/08/2013 0534   GLUCOSE 219* 10/08/2013 0534   BUN 24* 10/08/2013 0534   CREATININE 0.95 10/08/2013 0534   CALCIUM 8.4 10/08/2013 0534   PROT 7.3 09/27/2013 1428   ALBUMIN 3.7 09/27/2013 1428   AST 15 09/27/2013 1428   [name] 11 09/27/2013 1428   ALKPHOS 91 09/27/2013 1428   BILITOT 0.3 09/27/2013 1428   GFRNONAA 56* 10/08/2013 0534   GFRAA 65* 10/08/2013 0534    Assessment and Plan  BRONCHITS  - Z PACK AND TUSSINEX FAST MAX 20 MT Q 4; cxr -NEG  COUGH - said robitussin is not working;ordered tussionex fast max which has more guaifenesin and more dextromethorphan in it  Hennie Duos, MD

## 2013-12-15 ENCOUNTER — Encounter: Payer: Self-pay | Admitting: Internal Medicine

## 2013-12-15 ENCOUNTER — Non-Acute Institutional Stay (SKILLED_NURSING_FACILITY): Payer: Medicare Other | Admitting: Internal Medicine

## 2013-12-15 DIAGNOSIS — E785 Hyperlipidemia, unspecified: Secondary | ICD-10-CM

## 2013-12-15 DIAGNOSIS — E118 Type 2 diabetes mellitus with unspecified complications: Secondary | ICD-10-CM

## 2013-12-15 DIAGNOSIS — Z96619 Presence of unspecified artificial shoulder joint: Secondary | ICD-10-CM | POA: Insufficient documentation

## 2013-12-15 DIAGNOSIS — I10 Essential (primary) hypertension: Secondary | ICD-10-CM

## 2013-12-15 DIAGNOSIS — J209 Acute bronchitis, unspecified: Secondary | ICD-10-CM

## 2013-12-15 DIAGNOSIS — F0390 Unspecified dementia without behavioral disturbance: Secondary | ICD-10-CM

## 2013-12-15 DIAGNOSIS — E039 Hypothyroidism, unspecified: Secondary | ICD-10-CM

## 2013-12-15 DIAGNOSIS — E669 Obesity, unspecified: Secondary | ICD-10-CM

## 2013-12-15 DIAGNOSIS — IMO0002 Reserved for concepts with insufficient information to code with codable children: Secondary | ICD-10-CM

## 2013-12-15 DIAGNOSIS — E1165 Type 2 diabetes mellitus with hyperglycemia: Secondary | ICD-10-CM

## 2013-12-15 HISTORY — DX: Presence of unspecified artificial shoulder joint: Z96.619

## 2013-12-15 NOTE — Progress Notes (Signed)
MRN: [id number] Name: [name] [name]  Sex: female Age: 80 y.o. DOB: 1934-05-24  [place] #: [facility] Facility/Room: [facility] Level Of Care: SNF Provider: Inocencio Homes D Emergency Contacts: Extended Emergency Contact Information Primary Emergency Contact: Streeter,[name] Address: [address]          [place], [place] [address] [place] Phone: [phone] Work Phone: [phone] Mobile Phone: [phone] Relation: Daughter Secondary Emergency Contact: Aundra Millet, [place] Phone: [phone] Work Phone: [phone] Mobile Phone: [phone] Relation: Daughter  Code Status: FULL  Allergies: Vicodin; Polysaccharide iron complex; Amitriptyline; and Vesicare  Chief Complaint  Patient presents with  . Discharge Note    HPI: Patient is 80 y.o. female who was admitted for shoulder arthroplasty who is now ready to go home.  Past Medical History  Diagnosis Date  . Hypertension   . Depression   . CAD (coronary artery disease)   . GERD (gastroesophageal reflux disease)   . Bilateral carotid artery stenosis   . Diverticulitis   . Obesity   . Degenerative joint disease   . Hiatal hernia   . Dementia     early   . Atrial fibrillation with rapid ventricular response   . Hypothyroidism   . Type II diabetes mellitus   . Anemia   . Stroke 2011    "old very small TIA/neurologist"  . Chronic lower back pain     "back is very stiff because of fusion; can't stand up straight more than few moments"  . Bladder incontinence     Hx: of  . Complication of anesthesia     anesth. meds caused hallucinations   . Anxiety   . Endometriosis   . Memory loss   . Fall at home 05/15/2013  . Progressive dementia with uncertain etiology [id number]    Suspect Lewy body .  [name] Bowel incontinence   . Other and unspecified hyperlipidemia   . Unspecified hypothyroidism   . Anxiety state, unspecified   . Unspecified glaucoma   . Reflux  esophagitis   . Diverticulitis of colon (without mention of hemorrhage)   . Hypertonicity of bladder   . Osteoarthrosis, unspecified whether generalized or localized, unspecified site   . Lumbago   . Muscle weakness (generalized)   . Closed fracture of unspecified part of neck of femur   . Female stress incontinence   . Depressive disorder, not elsewhere classified     Past Surgical History  Procedure Laterality Date  . Ptca  03/10/12    LLE  . Coronary artery bypass graft  04/17/2011    LT  internal mammary artery to left anterior descending, saphenous vein graft first diagonal, saphenous  vein graft to obtuse marginal 1, saphenous vein graft to posterior descending  . Cholecystectomy    . Posterior fusion lumbar spine      "removed L4-5, S1"  . Total knee arthroplasty  ~ 2004    right  . Partial hip arthroplasty  11/2009    left; "just the ball"  . Cataract extraction w/ intraocular lens  implant, bilateral    . Neuroplasty / transposition median nerve at carpal tunnel bilateral    . Hand surgery      "for arthritis; right hand"  . Joint replacement    . Dilation and curettage of uterus      "for endometrosis"  . Scleral buckle Right 01/20/2013    Procedure: SCLERAL BUCKLE;  Surgeon: Jenny Reichmann  Eber Jones, MD;  Location: [facility];  Service: Ophthalmology;  Laterality: Right;  . Laser photo ablation Right 01/20/2013    Procedure: LASER PHOTO ABLATION;  Surgeon: Hayden Pedro, MD;  Location: [facility];  Service: Ophthalmology;  Laterality: Right;  . Back surgery      X2  . Carpal tunnel release Bilateral   . Dilation and curettage of uterus      ENDOMETRIOSIS  . Total shoulder arthroplasty Right 10/07/2013    Procedure: TOTAL SHOULDER ARTHROPLASTY;  Surgeon: Alta Corning, MD;  Location: [facility];  Service: Orthopedics;  Laterality: Right;          Medication List       This list is accurate as of: 12/15/13 10:50 AM.  Always use your most recent med list.               acetaminophen  500 MG tablet  Commonly known as:  TYLENOL  Take 500-1,000 mg by mouth daily as needed (for arthritis pain).     ascorbic acid 1000 MG tablet  Commonly known as:  VITAMIN C  Take 1,000 mg by mouth every Monday, Wednesday, and Friday.     atorvastatin 10 MG tablet  Commonly known as:  LIPITOR  Take 10 mg by mouth daily.     azithromycin 250 MG tablet  Commonly known as:  ZITHROMAX  Take 250 mg by mouth daily. For 3 more days 4/3-5     bimatoprost 0.01 % Soln  Commonly known as:  LUMIGAN  Place 1 drop into both eyes at bedtime.     cholecalciferol 1000 UNITS tablet  Commonly known as:  VITAMIN D  Take 1,000 Units by mouth See admin instructions. On Monday, Tuesday, Wednesday, Friday, Saturday.     clopidogrel 75 MG tablet  Commonly known as:  PLAVIX  Take 75 mg by mouth daily with breakfast.     Dextromethorphan-Guaifenesin 5-100 MG/5ML Liqd  Take 20 mLs by mouth every 4 (four) hours as needed.     docusate sodium 100 MG capsule  Commonly known as:  COLACE  Take 100 mg by mouth 2 (two) times daily.     ergocalciferol 50000 UNITS capsule  Commonly known as:  VITAMIN D2  Take 50,000 Units by mouth 2 (two) times a week. On Thursday and Sunday     felodipine 10 MG 24 hr tablet  Commonly known as:  PLENDIL  Take 10 mg by mouth daily.     FLUoxetine 20 MG capsule  Commonly known as:  PROZAC  Take 20 mg by mouth every morning.     glipiZIDE 5 MG 24 hr tablet  Commonly known as:  GLUCOTROL XL  Take 5 mg by mouth at bedtime.     hydrOXYzine 25 MG tablet  Commonly known as:  ATARAX/VISTARIL  Take 25 mg by mouth every 6 (six) hours as needed for itching.     LANTUS SOLOSTAR 100 UNIT/ML Solostar Pen  Generic drug:  Insulin Glargine  Inject 10 Units into the skin at bedtime.     latanoprost 0.005 % ophthalmic solution  Commonly known as:  XALATAN  Place 1 drop into both eyes at bedtime.     levothyroxine 125 MCG tablet  Commonly known as:  SYNTHROID, LEVOTHROID  Take  125 mcg by mouth daily before breakfast.     Melatonin 3 MG Tabs  Take 3 mg by mouth at bedtime as needed.     memantine 10 MG tablet  Commonly known  as:  NAMENDA  Take 10 mg by mouth 2 (two) times daily.     metFORMIN 500 MG tablet  Commonly known as:  GLUCOPHAGE  Take 500 mg by mouth 2 (two) times daily with a meal.     metoprolol tartrate 25 MG tablet  Commonly known as:  LOPRESSOR  Take 25 mg by mouth 2 (two) times daily.     mirabegron ER 50 MG Tb24 tablet  Commonly known as:  MYRBETRIQ  Take 50 mg by mouth every evening.     oxyCODONE-acetaminophen 5-325 MG per tablet  Commonly known as:  PERCOCET/ROXICET  Take 1 tablet by mouth every 8 (eight) hours as needed for severe pain.     pantoprazole 40 MG tablet  Commonly known as:  PROTONIX  Take 40 mg by mouth daily.     pioglitazone 30 MG tablet  Commonly known as:  ACTOS  Take 30 mg by mouth every morning.     polyethylene glycol packet  Commonly known as:  MIRALAX / GLYCOLAX  Take 17 g by mouth daily. Mix 17 gm in 4-8 oz of liquid daily     QUEtiapine 25 MG tablet  Commonly known as:  SEROQUEL  Take 1 tablet (25 mg total) by mouth at bedtime. May repeat once if needed     traMADol 50 MG tablet  Commonly known as:  ULTRAM  Take 50 mg by mouth every 6 (six) hours as needed for pain.        Meds ordered this encounter  Medications  . latanoprost (XALATAN) 0.005 % ophthalmic solution    Sig: Place 1 drop into both eyes at bedtime.  [name] azithromycin (ZITHROMAX) 250 MG tablet    Sig: Take 250 mg by mouth daily. For 3 more days 4/3-5  . Dextromethorphan-Guaifenesin 5-100 MG/5ML LIQD    Sig: Take 20 mLs by mouth every 4 (four) hours as needed.    Immunization History  Administered Date(s) Administered  . Influenza Whole 07/08/2010  . PPD Test 10/24/2013  . Pneumococcal Polysaccharide-23 10/08/2013    History  Substance Use Topics  . Smoking status: Former Smoker -- 1.50 packs/day    Types: Cigarettes     Quit date: 09/16/1987  . Smokeless tobacco: Never Used  . Alcohol Use: No    Filed Vitals:   12/15/13 1027  BP: 179/73  Pulse: 81  Temp: 97 F (36.1 C)  Resp: 20    Physical Exam  GENERAL APPEARANCE: Alert, conversant. Appropriately groomed. No acute distress.  HEENT: Unremarkable. RESPIRATORY: Breathing is even, unlabored. Lung sounds are mild rhonchi CARDIOVASCULAR: Heart RRR no murmurs, rubs or gallops. No peripheral edema.  GASTROINTESTINAL: Abdomen is soft, non-tender, not distended w/ normal bowel sounds.  NEUROLOGIC: Cranial nerves 2-12 grossly intact. Moves all extremities no tremor.  Patient Active Problem List   Diagnosis Date Noted  . Stautus post total shoulder arthroplasty 12/15/2013  . Type II or unspecified type diabetes mellitus with unspecified complication, uncontrolled 12/08/2013  . Constipation 12/08/2013  . Hyperlipemia 12/08/2013  . Dementia 12/08/2013  . Unspecified hypothyroidism 10/30/2013  . Osteoarthritis of right shoulder 10/07/2013  . Fall at home 05/15/2013  . Moderate vision impairment-both eyes 05/15/2013  . Progressive dementia with uncertain etiology [id number]  . Obesity (BMI 30-39.9) 05/15/2013  . Rhegmatogenous retinal detachment of right eye 01/10/2013  . Hypertension   . Thyroid disease   . Depression   . IDDM (insulin dependent diabetes mellitus)   . GERD (gastroesophageal reflux disease)   .  CAD (coronary artery disease)   . Bilateral carotid artery stenosis   . Obesity   . Degenerative joint disease   . Hiatal hernia   . Atrial fibrillation with rapid ventricular response     CBC    Component Value Date/Time   WBC 7.4 10/08/2013 0534   RBC 3.32* 10/08/2013 0534   HGB 10.8* 10/08/2013 0534   HCT 32.6* 10/08/2013 0534   PLT 204 10/08/2013 0534   MCV 98.2 10/08/2013 0534   LYMPHSABS 1.5 09/27/2013 1428   MONOABS 0.8 09/27/2013 1428   EOSABS 0.3 09/27/2013 1428   BASOSABS 0.0 09/27/2013 1428    CMP     Component Value  Date/Time   NA 139 10/08/2013 0534   K 4.3 10/08/2013 0534   CL 99 10/08/2013 0534   CO2 23 10/08/2013 0534   GLUCOSE 219* 10/08/2013 0534   BUN 24* 10/08/2013 0534   CREATININE 0.95 10/08/2013 0534   CALCIUM 8.4 10/08/2013 0534   PROT 7.3 09/27/2013 1428   ALBUMIN 3.7 09/27/2013 1428   AST 15 09/27/2013 1428   ALT 11 09/27/2013 1428   ALKPHOS 91 09/27/2013 1428   BILITOT 0.3 09/27/2013 1428   GFRNONAA 56* 10/08/2013 0534   GFRAA 65* 10/08/2013 0534    Assessment and Plan  Patient is improved and stable for discharge to home. Yesterday she started on a Z pack for bronchitis.  Hennie Duos, MD   MRN: [id number] Name: AMARIONA RATHJE  Sex: female Age: 80 y.o. DOB: 04-20-1934  [place] #:  Facility/Room: Level Of Care: SNF Provider: Inocencio Homes D Emergency Contacts: Extended Emergency Contact Information Primary Emergency Contact: Streeter,[name] Address: [address]          [place], [place] [address] [place] Phone: [phone] Work Phone: [phone] Mobile Phone: [phone] Relation: Daughter Secondary Emergency Contact: Aundra Millet, [place] of [place] Phone: [phone] Work Phone: [phone] Mobile Phone: [phone] Relation: Daughter  Code Status:   Allergies: Vicodin; Polysaccharide iron complex; Amitriptyline; and Vesicare  Chief Complaint  Patient presents with  . Discharge Note    HPI: Patient is 80 y.o. female who  Past Medical History  Diagnosis Date  . Hypertension   . Depression   . CAD (coronary artery disease)   . GERD (gastroesophageal reflux disease)   . Bilateral carotid artery stenosis   . Diverticulitis   . Obesity   . Degenerative joint disease   . Hiatal hernia   . Dementia     early   . Atrial fibrillation with rapid ventricular response   . Hypothyroidism   . Type II diabetes mellitus   . Anemia   . Stroke 2011    "old very small TIA/neurologist"  . Chronic lower back pain      "back is very stiff because of fusion; can't stand up straight more than few moments"  . Bladder incontinence     Hx: of  . Complication of anesthesia     anesth. meds caused hallucinations   . Anxiety   . Endometriosis   . Memory loss   . Fall at home 05/15/2013  . Progressive dementia with uncertain etiology 09/15/7508    Suspect Lewy body .  [name] Bowel incontinence   . Other and unspecified hyperlipidemia   . Unspecified hypothyroidism   . Anxiety state, unspecified   . Unspecified glaucoma   . Reflux esophagitis   . Diverticulitis of colon (without  mention of hemorrhage)   . Hypertonicity of bladder   . Osteoarthrosis, unspecified whether generalized or localized, unspecified site   . Lumbago   . Muscle weakness (generalized)   . Closed fracture of unspecified part of neck of femur   . Female stress incontinence   . Depressive disorder, not elsewhere classified     Past Surgical History  Procedure Laterality Date  . Ptca  03/10/12    LLE  . Coronary artery bypass graft  04/17/2011    LT  internal mammary artery to left anterior descending, saphenous vein graft first diagonal, saphenous  vein graft to obtuse marginal 1, saphenous vein graft to posterior descending  . Cholecystectomy    . Posterior fusion lumbar spine      "removed L4-5, S1"  . Total knee arthroplasty  ~ 2004    right  . Partial hip arthroplasty  11/2009    left; "just the ball"  . Cataract extraction w/ intraocular lens  implant, bilateral    . Neuroplasty / transposition median nerve at carpal tunnel bilateral    . Hand surgery      "for arthritis; right hand"  . Joint replacement    . Dilation and curettage of uterus      "for endometrosis"  . Scleral buckle Right 01/20/2013    Procedure: SCLERAL BUCKLE;  Surgeon: Hayden Pedro, MD;  Location: [facility];  Service: Ophthalmology;  Laterality: Right;  . Laser photo ablation Right 01/20/2013    Procedure: LASER PHOTO ABLATION;  Surgeon: Hayden Pedro, MD;   Location: [facility];  Service: Ophthalmology;  Laterality: Right;  . Back surgery      X2  . Carpal tunnel release Bilateral   . Dilation and curettage of uterus      ENDOMETRIOSIS  . Total shoulder arthroplasty Right 10/07/2013    Procedure: TOTAL SHOULDER ARTHROPLASTY;  Surgeon: Alta Corning, MD;  Location: [facility];  Service: Orthopedics;  Laterality: Right;      Medication List       This list is accurate as of: 12/15/13 10:50 AM.  Always use your most recent med list.               acetaminophen 500 MG tablet  Commonly known as:  TYLENOL  Take 500-1,000 mg by mouth daily as needed (for arthritis pain).     ascorbic acid 1000 MG tablet  Commonly known as:  VITAMIN C  Take 1,000 mg by mouth every Monday, Wednesday, and Friday.     atorvastatin 10 MG tablet  Commonly known as:  LIPITOR  Take 10 mg by mouth daily.     azithromycin 250 MG tablet  Commonly known as:  ZITHROMAX  Take 250 mg by mouth daily. For 3 more days 4/3-5     bimatoprost 0.01 % Soln  Commonly known as:  LUMIGAN  Place 1 drop into both eyes at bedtime.     cholecalciferol 1000 UNITS tablet  Commonly known as:  VITAMIN D  Take 1,000 Units by mouth See admin instructions. On Monday, Tuesday, Wednesday, Friday, Saturday.     clopidogrel 75 MG tablet  Commonly known as:  PLAVIX  Take 75 mg by mouth daily with breakfast.     Dextromethorphan-Guaifenesin 5-100 MG/5ML Liqd  Take 20 mLs by mouth every 4 (four) hours as needed.     docusate sodium 100 MG capsule  Commonly known as:  COLACE  Take 100 mg by mouth 2 (two) times daily.  ergocalciferol 50000 UNITS capsule  Commonly known as:  VITAMIN D2  Take 50,000 Units by mouth 2 (two) times a week. On Thursday and Sunday     felodipine 10 MG 24 hr tablet  Commonly known as:  PLENDIL  Take 10 mg by mouth daily.     FLUoxetine 20 MG capsule  Commonly known as:  PROZAC  Take 20 mg by mouth every morning.     glipiZIDE 5 MG 24 hr tablet  Commonly  known as:  GLUCOTROL XL  Take 5 mg by mouth at bedtime.     hydrOXYzine 25 MG tablet  Commonly known as:  ATARAX/VISTARIL  Take 25 mg by mouth every 6 (six) hours as needed for itching.     LANTUS SOLOSTAR 100 UNIT/ML Solostar Pen  Generic drug:  Insulin Glargine  Inject 10 Units into the skin at bedtime.     latanoprost 0.005 % ophthalmic solution  Commonly known as:  XALATAN  Place 1 drop into both eyes at bedtime.     levothyroxine 125 MCG tablet  Commonly known as:  SYNTHROID, LEVOTHROID  Take 125 mcg by mouth daily before breakfast.     Melatonin 3 MG Tabs  Take 3 mg by mouth at bedtime as needed.     memantine 10 MG tablet  Commonly known as:  NAMENDA  Take 10 mg by mouth 2 (two) times daily.     metFORMIN 500 MG tablet  Commonly known as:  GLUCOPHAGE  Take 500 mg by mouth 2 (two) times daily with a meal.     metoprolol tartrate 25 MG tablet  Commonly known as:  LOPRESSOR  Take 25 mg by mouth 2 (two) times daily.     mirabegron ER 50 MG Tb24 tablet  Commonly known as:  MYRBETRIQ  Take 50 mg by mouth every evening.     oxyCODONE-acetaminophen 5-325 MG per tablet  Commonly known as:  PERCOCET/ROXICET  Take 1 tablet by mouth every 8 (eight) hours as needed for severe pain.     pantoprazole 40 MG tablet  Commonly known as:  PROTONIX  Take 40 mg by mouth daily.     pioglitazone 30 MG tablet  Commonly known as:  ACTOS  Take 30 mg by mouth every morning.     polyethylene glycol packet  Commonly known as:  MIRALAX / GLYCOLAX  Take 17 g by mouth daily. Mix 17 gm in 4-8 oz of liquid daily     QUEtiapine 25 MG tablet  Commonly known as:  SEROQUEL  Take 1 tablet (25 mg total) by mouth at bedtime. May repeat once if needed     traMADol 50 MG tablet  Commonly known as:  ULTRAM  Take 50 mg by mouth every 6 (six) hours as needed for pain.        Meds ordered this encounter  Medications  . latanoprost (XALATAN) 0.005 % ophthalmic solution    Sig: Place 1 drop  into both eyes at bedtime.  [name] azithromycin (ZITHROMAX) 250 MG tablet    Sig: Take 250 mg by mouth daily. For 3 more days 4/3-5  . Dextromethorphan-Guaifenesin 5-100 MG/5ML LIQD    Sig: Take 20 mLs by mouth every 4 (four) hours as needed.    Immunization History  Administered Date(s) Administered  . Influenza Whole 07/08/2010  . PPD Test 10/24/2013  . Pneumococcal Polysaccharide-23 10/08/2013    History  Substance Use Topics  . Smoking status: Former Smoker -- 1.50 packs/day    Types:  Cigarettes    Quit date: 09/16/1987  . Smokeless tobacco: Never Used  . Alcohol Use: No    Filed Vitals:   12/15/13 1027  BP: 179/73  Pulse: 81  Temp: 97 F (36.1 C)  Resp: 20    Physical Exam  GENERAL APPEARANCE: Alert, conversant. Appropriately groomed. No acute distress.  HEENT: Unremarkable. RESPIRATORY: Breathing is even, unlabored. Lung sounds are clear   CARDIOVASCULAR: Heart RRR no murmurs, rubs or gallops. No peripheral edema.  GASTROINTESTINAL: Abdomen is soft, non-tender, not distended w/ normal bowel sounds.  NEUROLOGIC: Cranial nerves 2-12 grossly intact. Moves all extremities no tremor.  Patient Active Problem List   Diagnosis Date Noted  . Stautus post total shoulder arthroplasty 12/15/2013  . Type II or unspecified type diabetes mellitus with unspecified complication, uncontrolled 12/08/2013  . Constipation 12/08/2013  . Hyperlipemia 12/08/2013  . Dementia 12/08/2013  . Unspecified hypothyroidism 10/30/2013  . Osteoarthritis of right shoulder 10/07/2013  . Fall at home 05/15/2013  . Moderate vision impairment-both eyes 05/15/2013  . Progressive dementia with uncertain etiology 123XX123  . Obesity (BMI 30-39.9) 05/15/2013  . Rhegmatogenous retinal detachment of right eye 01/10/2013  . Hypertension   . Thyroid disease   . Depression   . IDDM (insulin dependent diabetes mellitus)   . GERD (gastroesophageal reflux disease)   . CAD (coronary artery disease)   .  Bilateral carotid artery stenosis   . Obesity   . Degenerative joint disease   . Hiatal hernia   . Atrial fibrillation with rapid ventricular response     CBC    Component Value Date/Time   WBC 7.4 10/08/2013 0534   RBC 3.32* 10/08/2013 0534   HGB 10.8* 10/08/2013 0534   HCT 32.6* 10/08/2013 0534   PLT 204 10/08/2013 0534   MCV 98.2 10/08/2013 0534   LYMPHSABS 1.5 09/27/2013 1428   MONOABS 0.8 09/27/2013 1428   EOSABS 0.3 09/27/2013 1428   BASOSABS 0.0 09/27/2013 1428    CMP     Component Value Date/Time   NA 139 10/08/2013 0534   K 4.3 10/08/2013 0534   CL 99 10/08/2013 0534   CO2 23 10/08/2013 0534   GLUCOSE 219* 10/08/2013 0534   BUN 24* 10/08/2013 0534   CREATININE 0.95 10/08/2013 0534   CALCIUM 8.4 10/08/2013 0534   PROT 7.3 09/27/2013 1428   ALBUMIN 3.7 09/27/2013 1428   AST 15 09/27/2013 1428   ALT 11 09/27/2013 1428   ALKPHOS 91 09/27/2013 1428   BILITOT 0.3 09/27/2013 1428   GFRNONAA 56* 10/08/2013 0534   GFRAA 65* 10/08/2013 0534    Assessment and Plan  No problem-specific assessment & plan notes found for this encounter.   Hennie Duos, MD

## 2014-01-18 ENCOUNTER — Ambulatory Visit (INDEPENDENT_AMBULATORY_CARE_PROVIDER_SITE_OTHER): Payer: Medicare Other | Admitting: Interventional Cardiology

## 2014-01-18 ENCOUNTER — Encounter: Payer: Self-pay | Admitting: Interventional Cardiology

## 2014-01-18 VITALS — BP 143/63 | HR 66 | Ht 60.0 in | Wt 184.8 lb

## 2014-01-18 DIAGNOSIS — E782 Mixed hyperlipidemia: Secondary | ICD-10-CM

## 2014-01-18 DIAGNOSIS — I739 Peripheral vascular disease, unspecified: Secondary | ICD-10-CM

## 2014-01-18 DIAGNOSIS — I2510 Atherosclerotic heart disease of native coronary artery without angina pectoris: Secondary | ICD-10-CM

## 2014-01-18 DIAGNOSIS — I10 Essential (primary) hypertension: Secondary | ICD-10-CM

## 2014-01-18 MED ORDER — PRAVASTATIN SODIUM 80 MG PO TABS: ORAL_TABLET | ORAL | Status: DC

## 2014-01-18 NOTE — Progress Notes (Signed)
Patient ID: [name] [name], female   DOB: [birth year], 80 y.o.   MRN: [id number]    [name], [address] [place], [place]  [address] Phone: [phone] Fax:  [phone]  Date:  01/18/2014   ID:  [name] [name], DOB September 20, 1933, MRN [id number]  PCP:  [name] Finer, MD      History of Present Illness: [name] [name] is a 80 y.o. female who had shoulder surgery in Jan 2015. SHe had CABG in 2012. She had LE arterial disease that required angioplasty in 2013. She has done well since that time. She has some occasional chest pain which are shortlived. Emotional stress seems to make her chest discomfort come on. She does not do much walking. She admits to dementia affecting her as well.  Early chest discomfort is a short-lived sharp pain at the site of her incision. The pains last only a second. There not related to exertion. She has no further sores developing on her feet.    Wt Readings from Last 3 Encounters:  01/18/14 184 lb 12.8 oz (83.825 kg)  12/08/13 187 lb (84.823 kg)  10/07/13 174 lb (78.926 kg)     Past Medical History  Diagnosis Date  . Hypertension   . Depression   . CAD (coronary artery disease)   . GERD (gastroesophageal reflux disease)   . Bilateral carotid artery stenosis   . Diverticulitis   . Obesity   . Degenerative joint disease   . Hiatal hernia   . Dementia     early   . Atrial fibrillation with rapid ventricular response   . Hypothyroidism   . Type II diabetes mellitus   . Anemia   . Stroke 2011    "old very small TIA/neurologist"  . Chronic lower back pain     "back is very stiff because of fusion; can't stand up straight more than few moments"  . Bladder incontinence     Hx: of  . Complication of anesthesia     anesth. meds caused hallucinations   . Anxiety   . Endometriosis   . Memory loss   . Fall at home 05/15/2013  . Progressive dementia with uncertain etiology 7/34/1937    Suspect Lewy body .  [name] Bowel incontinence   . Other and  unspecified hyperlipidemia   . Unspecified hypothyroidism   . Anxiety state, unspecified   . Unspecified glaucoma   . Reflux esophagitis   . Diverticulitis of colon (without mention of hemorrhage)   . Hypertonicity of bladder   . Osteoarthrosis, unspecified whether generalized or localized, unspecified site   . Lumbago   . Muscle weakness (generalized)   . Closed fracture of unspecified part of neck of femur   . Female stress incontinence   . Depressive disorder, not elsewhere classified     Current Outpatient Prescriptions  Medication Sig Dispense Refill  . acetaminophen (TYLENOL) 500 MG tablet Take 500-1,000 mg by mouth daily as needed (for arthritis pain).      [name] ascorbic acid (VITAMIN C) 1000 MG tablet Take 1,000 mg by mouth every Monday, Wednesday, and Friday.       [name] aspirin 81 MG tablet Take 81 mg by mouth daily.      . bimatoprost (LUMIGAN) 0.01 % SOLN Place 1 drop into both eyes at bedtime.      . cholecalciferol (VITAMIN D) 1000 UNITS tablet Take 1,000 Units by mouth See admin instructions. On Monday, Tuesday, Wednesday, Friday, Saturday.      [name]  clopidogrel (PLAVIX) 75 MG tablet Take 75 mg by mouth daily with breakfast.      . ergocalciferol (VITAMIN D2) 50000 UNITS capsule Take 50,000 Units by mouth 2 (two) times a week. On Thursday and Sunday      . felodipine (PLENDIL) 10 MG 24 hr tablet Take 10 mg by mouth daily.      [name] FLUoxetine (PROZAC) 20 MG capsule Take 20 mg by mouth every morning.       [name] glipiZIDE (GLUCOTROL XL) 5 MG 24 hr tablet Take 5 mg by mouth at bedtime.       . hydrOXYzine (ATARAX/VISTARIL) 25 MG tablet Take 25 mg by mouth every 6 (six) hours as needed for itching.      . Insulin Glargine (LANTUS SOLOSTAR) 100 UNIT/ML SOPN Inject 10 Units into the skin at bedtime.      [name] levothyroxine (SYNTHROID, LEVOTHROID) 125 MCG tablet Take 125 mcg by mouth daily before breakfast.       . memantine (NAMENDA) 10 MG tablet Take 10 mg by mouth daily.       . metFORMIN  (GLUCOPHAGE) 500 MG tablet Take 500 mg by mouth 2 (two) times daily with a meal.      . metoprolol tartrate (LOPRESSOR) 25 MG tablet Take 25 mg by mouth 2 (two) times daily.        . mirabegron ER (MYRBETRIQ) 50 MG TB24 Take 50 mg by mouth every evening.      [name] omeprazole (PRILOSEC) 20 MG capsule Take 20 mg by mouth daily.      . pioglitazone (ACTOS) 30 MG tablet Take 30 mg by mouth every morning.       . polyethylene glycol (MIRALAX / GLYCOLAX) packet Take 17 g by mouth daily. Mix 17 gm in 4-8 oz of liquid daily      . pravastatin (PRAVACHOL) 40 MG tablet Take 40 mg by mouth daily.      . QUEtiapine (SEROQUEL) 25 MG tablet Take 1 tablet (25 mg total) by mouth at bedtime. May repeat once if needed  60 tablet  6  . traMADol (ULTRAM) 50 MG tablet Take 50 mg by mouth every 6 (six) hours as needed for pain.       . [DISCONTINUED] amiodarone (PACERONE) 200 MG tablet Take 200 mg by mouth 3 (three) times a week.        No current facility-administered medications for this visit.    Allergies:    Allergies  Allergen Reactions  . Vicodin [Hydrocodone-Acetaminophen] Other (See Comments)    Hallucinations - do NOT verify Vicodin orders  . Polysaccharide Iron Complex Hives, Itching and Rash    Patient can tolerate Ferrous Sulfate  . Amitriptyline     hallucinations  . Vesicare [Solifenacin]     hallucinations    Social History:  The patient  reports that she quit smoking about 26 years ago. Her smoking use included Cigarettes. She smoked 1.50 packs per day. She has never used smokeless tobacco. She reports that she does not drink alcohol or use illicit drugs.   Family History:  The patient's family history includes Cancer in her cousin and paternal aunt; Heart attack in her father and mother.   ROS:  Please see the history of present illness.  No nausea, vomiting.  No fevers, chills.  No focal weakness.  No dysuria.   All other systems reviewed and negative.   PHYSICAL EXAM: VS:  BP 143/63   Pulse 66  Ht 5' (1.524  m)  Wt 184 lb 12.8 oz (83.825 kg)  BMI 36.09 kg/m2 Well nourished, well developed, in no acute distress HEENT: normal Neck: no JVD, no carotid bruits Cardiac:  normal S1, S2; RRR;  Lungs:  clear to auscultation bilaterally, no wheezing, rhonchi or rales Abd: soft, nontender, no hepatomegaly Ext: no edema, tr DP pulse on the left.  Area of dead skin in the left heel. Skin: warm and dry Neuro:   no focal abnormalities noted  EKG:     NSR, LVH, ST changes  ASSESSMENT AND PLAN:  1.Essential hypertension, benign  Continue Metoprolol Tartrate Tablet, 25 MG, take 1 tablet by mouth twice a day for hypertension Continue Felodipine Tablet Extended Release 24 Hour, 5 MG, 1 tablet, Orally, Once a day Notes: Controlled today.  Elevated with home health at the time of PT.  Some pain associated with PT.  continue to follow blood pressure at home. If She continues to get elevated readings over 150/90, would have to adjust medication.    2. Coronary artery disease  Notes: No angina. status post bypass surgery in 2012. Her coronary artery disease was discovered during part of her preoperative workup for shoulder surgery.    3. Peripheral Vascular Disease  Notes: Left SFA angioplasty for foot ulcer in 2013. Ulcer has healed but she has some area of dry skin on the left side of the foot.   4.  Hyperlipidemia:  LDL 132 in 12/14.  On pravastatin 40 mg.  WIll increase to 80 mg daily.  Recheck in a few months. Preventive Medicine  Adult topics discussed:  Diet: healthy diet.  Exercise: 5 days a week, at least 30 minutes of aerobic exercise.      Signed, Mina Marble, MD, [facility] 01/18/2014 12:36 PM

## 2014-01-18 NOTE — Patient Instructions (Addendum)
Your physician has recommended you make the following change in your medication:  1. Increase pravastatin to 80 mg daily.  Your physician recommends that you return for a FASTING lipid profile and hepatic in 2 months on March 20, 2014.   Your physician wants you to follow-up in: 1 year with Dr. Irish Lack. You will receive a reminder letter in the mail two months in advance. If you don't receive a letter, please call our office to schedule the follow-up appointment.

## 2014-01-26 ENCOUNTER — Other Ambulatory Visit: Payer: Self-pay

## 2014-01-26 MED ORDER — PRAVASTATIN SODIUM 80 MG PO TABS
ORAL_TABLET | ORAL | Status: DC
Start: 2014-01-26 — End: 2015-02-02

## 2014-02-09 ENCOUNTER — Telehealth: Payer: Self-pay | Admitting: Neurology

## 2014-02-09 NOTE — Telephone Encounter (Signed)
Patient's daughter [name] calling to state that she would like to discuss with Dr. Brett Fairy about patient's driving abilities, [name] does not want to speak about this with patient around so she is requesting a call. Please call and advise.

## 2014-02-10 NOTE — Telephone Encounter (Signed)
Called [name]. No answer. No messge left.

## 2014-02-13 NOTE — Telephone Encounter (Signed)
I called and spoke to daughter, [name], re: mother and her driving.  I noted from 05-11-13 note that pt was informed not to drive.  Note placed up front for daughter,  She needs to sign release and no charge.

## 2014-02-13 NOTE — Telephone Encounter (Signed)
Returning a call

## 2014-02-21 ENCOUNTER — Encounter (INDEPENDENT_AMBULATORY_CARE_PROVIDER_SITE_OTHER): Payer: Self-pay

## 2014-02-21 ENCOUNTER — Ambulatory Visit (INDEPENDENT_AMBULATORY_CARE_PROVIDER_SITE_OTHER): Payer: Medicare Other | Admitting: Neurology

## 2014-02-21 ENCOUNTER — Encounter: Payer: Self-pay | Admitting: Neurology

## 2014-02-21 VITALS — BP 164/57 | HR 52 | Resp 18

## 2014-02-21 DIAGNOSIS — F02818 Dementia in other diseases classified elsewhere, unspecified severity, with other behavioral disturbance: Secondary | ICD-10-CM

## 2014-02-21 DIAGNOSIS — F0281 Dementia in other diseases classified elsewhere with behavioral disturbance: Secondary | ICD-10-CM

## 2014-02-21 DIAGNOSIS — G3183 Dementia with Lewy bodies: Principal | ICD-10-CM

## 2014-02-21 DIAGNOSIS — F0280 Dementia in other diseases classified elsewhere without behavioral disturbance: Secondary | ICD-10-CM

## 2014-02-21 HISTORY — DX: Dementia in other diseases classified elsewhere, unspecified severity, with other behavioral disturbance: F02.818

## 2014-02-21 HISTORY — DX: Dementia in other diseases classified elsewhere with behavioral disturbance: F02.81

## 2014-02-21 HISTORY — DX: Neurocognitive disorder with Lewy bodies: G31.83

## 2014-02-21 NOTE — Progress Notes (Signed)
[facility]  Provider:  Larey Seat, M D  Referring Provider: Horton Finer,* Primary Care Physician:  Horton Finer, MD  Chief Complaint  Patient presents with  . Follow-up    Room 11  . Memory Loss    HPI:  [name] [name] is a 80 y.o. female  Is seen here as a  revisit  from Dr. Maxwell Caul for follow up on memory loss.   This patient had undergone a shoulder replacement in January 2015 and went again to Rehab at [facility] for a 2 month period, followed by out patient therapy until last week.  [name] [name] has followed her for eye-care, and she reported her right eye remained irritated and itching. Her eyes are very dry. She has used Restasis for several years, but stopped because of the price. She is on Lumigan eye drops still. She is conversant and pleasant. The patient was not longer aware that she had gotten lost several times. The daughter reminded her that these events truly took place.   [name] [name] has been an established patient since 2011 in the [facility] practice. She was initially referred here for mild memory loss and hallucinations.  The hallucinations began after a hip replacement in March 2011, after which she moved to rehabilitation at [facility]. She had experienced a noticeable difference in her handwriting and a left facial droop but a CT scan was negative for stroke. In August 2013 she left [employer],  and then returned home and now again lives alone.  Her Mini-Mental Status Examination was 29 of 30 points or more, was reaching last time  a 30 points AFT was 10 points. Fall risk was 10 points in 2013 .  In December 2013 to return for follow up with another  MMSE., which documented 26/30 , followed by [facility] testing at  16/30 points.  I repeated the Wineglass  at 13/30 points on  02/21/2014. Her fall risk was  increased to 15 points, GDS  Was (geriatric depression score) endorsed at 5 points. The patient underwent another eye surgery  on May 8th of this year. She had decreasing visual acuity , her right eye remained  red, puffy  and it burned. She has gotten confused when walking to the local bank, has been getting lost on familiar roads and she is not longer permitted to drive.  The 2011  hallucinations were described as " a strong sense that a snake was in the room " ,that there were snakes in her house-  she also has heard a hissing  sounds and rales and feared that the snake was in her room.. She called the police feel that somebody would break into a home, 13 times-  and as she recalled did not get a warm response. With  the beginning of treatment with Seroquel , her  hallucinations resolved. She still reports vivid and at  times crazy dreams. REM BD.   There is no documented nocturnal activity for the last 12 months, but the patient also lives along and has no witnesses.  Review of Systems: Out of a complete 14 system review, the patient complains of only the following symptoms, and all other reviewed systems are negative.  vision loss, fall risk high, walks  on 4 pod cane. Often uses wheelchair for longer distance.   Memory loss, high degree of fatigue, vision is limited, burning pain in the right eye, she has gotten very pale.  She has bowel and urinary incontinence.  History   Social History  . Marital Status: Divorced    Spouse Name: N/A    Number of Children: 2  . Years of Education: COLLEGE   Occupational History  .      retired Pharmacist, hospital   Social History Main Topics  . Smoking status: Former Smoker -- 1.50 packs/day    Types: Cigarettes    Quit date: 09/16/1987  . Smokeless tobacco: Never Used  . Alcohol Use: No  . Drug Use: No  . Sexual Activity: No   Other Topics Concern  . Not on file   Social History Narrative   Patient is divorced and lives alone.   Patient has two adult children.   Patient is a retired Pharmacist, hospital.   Patient has a college education.   Patient is left-handed.   Patient does  not drink any caffeine.    Family History  Problem Relation Age of Onset  . Cancer Paternal Aunt   . Cancer Cousin   . Heart attack Mother   . Heart attack Father     Past Medical History  Diagnosis Date  . Hypertension   . Depression   . CAD (coronary artery disease)   . GERD (gastroesophageal reflux disease)   . Bilateral carotid artery stenosis   . Diverticulitis   . Obesity   . Degenerative joint disease   . Hiatal hernia   . Dementia     early   . Atrial fibrillation with rapid ventricular response   . Hypothyroidism   . Type II diabetes mellitus   . Anemia   . Stroke 2011    "old very small TIA/neurologist"  . Chronic lower back pain     "back is very stiff because of fusion; can't stand up straight more than few moments"  . Bladder incontinence     Hx: of  . Complication of anesthesia     anesth. meds caused hallucinations   . Anxiety   . Endometriosis   . Memory loss   . Fall at home 05/15/2013  . Progressive dementia with uncertain etiology 0/94/7096    Suspect Lewy body .  [name] Bowel incontinence   . Other and unspecified hyperlipidemia   . Unspecified hypothyroidism   . Anxiety state, unspecified   . Unspecified glaucoma   . Reflux esophagitis   . Diverticulitis of colon (without mention of hemorrhage)   . Hypertonicity of bladder   . Osteoarthrosis, unspecified whether generalized or localized, unspecified site   . Lumbago   . Muscle weakness (generalized)   . Closed fracture of unspecified part of neck of femur   . Female stress incontinence   . Depressive disorder, not elsewhere classified     Past Surgical History  Procedure Laterality Date  . Ptca  03/10/12    LLE  . Coronary artery bypass graft  04/17/2011    LT  internal mammary artery to left anterior descending, saphenous vein graft first diagonal, saphenous  vein graft to obtuse marginal 1, saphenous vein graft to posterior descending  . Cholecystectomy    . Posterior fusion lumbar spine       "removed L4-5, S1"  . Total knee arthroplasty  ~ 2004    right  . Partial hip arthroplasty  11/2009    left; "just the ball"  . Cataract extraction w/ intraocular lens  implant, bilateral    . Neuroplasty / transposition median nerve at carpal tunnel bilateral    . Hand surgery      "for  arthritis; right hand"  . Joint replacement    . Dilation and curettage of uterus      "for endometrosis"  . Scleral buckle Right 01/20/2013    Procedure: SCLERAL BUCKLE;  Surgeon: Hayden Pedro, MD;  Location: [facility];  Service: Ophthalmology;  Laterality: Right;  . Laser photo ablation Right 01/20/2013    Procedure: LASER PHOTO ABLATION;  Surgeon: Hayden Pedro, MD;  Location: [place] [facility];  Service: Ophthalmology;  Laterality: Right;  . Back surgery      X2  . Carpal tunnel release Bilateral   . Dilation and curettage of uterus      ENDOMETRIOSIS  . Total shoulder arthroplasty Right 10/07/2013    Procedure: TOTAL SHOULDER ARTHROPLASTY;  Surgeon: Alta Corning, MD;  Location: [place];  Service: Orthopedics;  Laterality: Right;    Current Outpatient Prescriptions  Medication Sig Dispense Refill  . acetaminophen (TYLENOL) 500 MG tablet Take 500-1,000 mg by mouth daily as needed (for arthritis pain).      [name] ascorbic acid (VITAMIN C) 1000 MG tablet Take 1,000 mg by mouth every Monday, Wednesday, and Friday.       [name] aspirin 81 MG tablet Take 81 mg by mouth daily.      . bimatoprost (LUMIGAN) 0.01 % SOLN Place 1 drop into both eyes at bedtime.      . cholecalciferol (VITAMIN D) 1000 UNITS tablet Take 1,000 Units by mouth See admin instructions. On Monday, Tuesday, Wednesday, Friday, Saturday.      . ergocalciferol (VITAMIN D2) 50000 UNITS capsule Take 50,000 Units by mouth 2 (two) times a week. On Thursday and Sunday      . felodipine (PLENDIL) 10 MG 24 hr tablet Take 10 mg by mouth daily.      [name] FLUoxetine (PROZAC) 20 MG capsule Take 20 mg by mouth every morning.       [name] glipiZIDE (GLUCOTROL XL) 5 MG 24 hr  tablet Take 5 mg by mouth at bedtime.       . hydrOXYzine (ATARAX/VISTARIL) 25 MG tablet Take 25 mg by mouth every 6 (six) hours as needed for itching.      . Insulin Glargine (LANTUS SOLOSTAR) 100 UNIT/ML SOPN Inject 10 Units into the skin at bedtime.      [name] levothyroxine (SYNTHROID, LEVOTHROID) 125 MCG tablet Take 125 mcg by mouth daily before breakfast.       . memantine (NAMENDA) 10 MG tablet Take 10 mg by mouth daily.       . metFORMIN (GLUCOPHAGE) 500 MG tablet Take 500 mg by mouth 2 (two) times daily with a meal.      . metoprolol tartrate (LOPRESSOR) 25 MG tablet Take 25 mg by mouth 2 (two) times daily.        . mirabegron ER (MYRBETRIQ) 50 MG TB24 Take 50 mg by mouth every evening.      [name] omeprazole (PRILOSEC) 20 MG capsule Take 20 mg by mouth daily.      . pioglitazone (ACTOS) 30 MG tablet Take 30 mg by mouth every morning.       . polyethylene glycol (MIRALAX / GLYCOLAX) packet Take 17 g by mouth daily. Mix 17 gm in 4-8 oz of liquid daily      . pravastatin (PRAVACHOL) 80 MG tablet 1 tablet daily  90 tablet  2  . QUEtiapine (SEROQUEL) 25 MG tablet Take 1 tablet (25 mg total) by mouth at bedtime. May repeat once if needed  60 tablet  6  . [  DISCONTINUED] amiodarone (PACERONE) 200 MG tablet Take 200 mg by mouth 3 (three) times a week.        No current facility-administered medications for this visit.    Allergies as of 02/21/2014 - Review Complete 02/21/2014  Allergen Reaction Noted  . Vicodin [hydrocodone-acetaminophen] Other (See Comments) 05/09/2011  . Polysaccharide iron complex Hives, Itching, and Rash 05/12/2011  . Amitriptyline  09/27/2013  . Vesicare [solifenacin]  09/27/2013    Vitals: BP 164/57  Pulse 52  Resp 18 Last Weight:  Wt Readings from Last 1 Encounters:  01/18/14 184 lb 12.8 oz (83.825 kg)   Last Height:   Ht Readings from Last 1 Encounters:  01/18/14 5' (1.524 m)    Physical exam:  General: The patient is awake, alert and appears not in acute  distress. The patient is well groomed. Head: Normocephalic, atraumatic. Neck is supple. Cardiovascular:  Regular rate and rhythm without carotid bruit, and without distended neck veins. Respiratory: Lungs are clear to auscultation. Skin:  Without evidence of edema, or rash Trunk: BMI is elevated . The  patient  has  fallen in June 2014, increased fall risk score to 15 points.  Neurologic exam : The patient is awake and alert, oriented to place and time.  Memory subjective  described as declining , MOCA today was remarkable at 24  points. An MMSE was not tested.   There is no longer a  normal attention span & concentration ability. Speech is fluent without dysarthria, dysphonia or aphasia.  Mood and affect are appropriate. She is happy.  Cranial nerves: Pupils are equal and briskly reactive to light. Her right eye is reddish swollen. Extraocular movements  in vertical and horizontal planes intact and without nystagmus. Visual fields by finger perimetry are intact. Hearing to finger rub intact.  Facial sensation intact to fine touch.  Facial motor strength is symmetric - there is a hint of left facial droop - and tongue and uvula move midline.  Motor exam:    normal strength in upper extremities. Good grip strength. She has restored her ROM for the shoulder.   Sensory:  Fine touch, pinprick and vibration were tested in all extremities normal.  Coordination: Rapid alternating movements in the fingers/hands are slow .  Finger-to-nose maneuver tested and normal without evidence of ataxia, dysmetria or tremor.   Gait and station: Patient walks with assistive device, very slow and leaning to the left.    Deep tendon reflexes: in the  upper and lower extremities are symmetric and intact. Babinski maneuver response is still downgoing.   Assessment:  Advancing memory los, inability to multitask, delay in motor responses and poor vision.  The patient is not allowed to drive any longer.   Treatment  plan and additional workup : DEMENTIA versus mild cognitive impairment. Lewy body or frontal lobe dementia were suspected, but she no longer has hallucinations of auditory character on medication.  Her MOCA today was much better than the last, which can be seen in day to day variabilty in lewy body. She lacks the cogwheeling.   and i am not sure if she has REM BD.  Dicussed driving restrictions - she should not even drive shopping or in her residential area.  She did have orthostatic dizziness at times. Wheel chair prevents falls.

## 2014-03-14 NOTE — Telephone Encounter (Signed)
Closing encounter

## 2014-03-20 ENCOUNTER — Other Ambulatory Visit (INDEPENDENT_AMBULATORY_CARE_PROVIDER_SITE_OTHER): Payer: Medicare Other

## 2014-03-20 DIAGNOSIS — E782 Mixed hyperlipidemia: Secondary | ICD-10-CM

## 2014-03-20 LAB — LIPID PANEL
CHOL/HDL RATIO: 5
Cholesterol: 222 mg/dL — ABNORMAL HIGH (ref 0–200)
HDL: 42.5 mg/dL (ref >39.00)
LDL Cholesterol: 123 mg/dL — ABNORMAL HIGH (ref 0–99)
NonHDL: 179.5
Triglycerides: 285 mg/dL — ABNORMAL HIGH (ref 0.0–149.0)
VLDL: 57 mg/dL — ABNORMAL HIGH (ref 0.0–40.0)

## 2014-03-20 LAB — HEPATIC FUNCTION PANEL
ALBUMIN: 3.9 g/dL (ref 3.5–5.2)
ALT: 13 U/L (ref 0–35)
AST: 20 U/L (ref 0–37)
Alkaline Phosphatase: 85 U/L (ref 39–117)
Bilirubin, Direct: 0 mg/dL (ref 0.0–0.3)
Total Bilirubin: 0.6 mg/dL (ref 0.2–1.2)
Total Protein: 7.2 g/dL (ref 6.0–8.3)

## 2014-03-24 ENCOUNTER — Telehealth: Payer: Self-pay | Admitting: Cardiology

## 2014-03-24 DIAGNOSIS — E785 Hyperlipidemia, unspecified: Secondary | ICD-10-CM

## 2014-03-24 MED ORDER — EZETIMIBE 10 MG PO TABS
10.0000 mg | ORAL_TABLET | Freq: Every day | ORAL | Status: DC
Start: 2014-03-24 — End: 2014-12-07

## 2014-03-24 NOTE — Telephone Encounter (Signed)
Message copied by Alcario Drought on Fri Mar 24, 2014  2:54 PM ------      Message from: SMART, Maralyn Sago      Created: Wed Mar 22, 2014  3:58 PM       RF:  CAD, PVD, Diabetes, h/o CVA, age, low HDL - LDL goal < 70, non-HDL goal < 100      Meds:  Pravastatin 80 mg qd      Has taken / tried both simvastatin and Crestor in the past.        LDL and non-HDL both elevated.        Dr. Irish Lack has patient increase pravastatin to 80 mg in 01/2014.  Would like to add Zetia 10 mg qd to regimen given her elevated CV risk.      Plan:      1.  Add Zetia 10 mg qd.      2.  Continue pravastatin 80 mg qhs.      3.  Recheck lipid panel and hepatic panel in 3 months.      Please notify patient, update meds, and set up labs. Thanks. ------

## 2014-03-24 NOTE — Telephone Encounter (Signed)
Pt notified. Meds updated and lab scheduled.

## 2014-04-20 ENCOUNTER — Ambulatory Visit (INDEPENDENT_AMBULATORY_CARE_PROVIDER_SITE_OTHER): Payer: Medicare Other | Admitting: Ophthalmology

## 2014-04-20 DIAGNOSIS — H33009 Unspecified retinal detachment with retinal break, unspecified eye: Secondary | ICD-10-CM

## 2014-04-20 DIAGNOSIS — E11359 Type 2 diabetes mellitus with proliferative diabetic retinopathy without macular edema: Secondary | ICD-10-CM

## 2014-04-20 DIAGNOSIS — H43819 Vitreous degeneration, unspecified eye: Secondary | ICD-10-CM

## 2014-04-20 DIAGNOSIS — E11319 Type 2 diabetes mellitus with unspecified diabetic retinopathy without macular edema: Secondary | ICD-10-CM

## 2014-04-20 DIAGNOSIS — E1165 Type 2 diabetes mellitus with hyperglycemia: Secondary | ICD-10-CM

## 2014-04-20 DIAGNOSIS — E1139 Type 2 diabetes mellitus with other diabetic ophthalmic complication: Secondary | ICD-10-CM

## 2014-05-13 ENCOUNTER — Other Ambulatory Visit: Payer: Self-pay | Admitting: Internal Medicine

## 2014-06-26 ENCOUNTER — Other Ambulatory Visit (INDEPENDENT_AMBULATORY_CARE_PROVIDER_SITE_OTHER): Payer: Medicare Other | Admitting: *Deleted

## 2014-06-26 DIAGNOSIS — E785 Hyperlipidemia, unspecified: Secondary | ICD-10-CM

## 2014-06-26 LAB — HEPATIC FUNCTION PANEL
ALBUMIN: 3.3 g/dL — AB (ref 3.5–5.2)
ALT: 13 U/L (ref 0–35)
AST: 19 U/L (ref 0–37)
Alkaline Phosphatase: 70 U/L (ref 39–117)
BILIRUBIN TOTAL: 0.5 mg/dL (ref 0.2–1.2)
Bilirubin, Direct: 0.1 mg/dL (ref 0.0–0.3)
Total Protein: 7.1 g/dL (ref 6.0–8.3)

## 2014-06-26 LAB — LIPID PANEL
Cholesterol: 157 mg/dL (ref 0–200)
HDL: 30.8 mg/dL — AB (ref >39.00)
NonHDL: 126.2
TRIGLYCERIDES: 250 mg/dL — AB (ref 0.0–149.0)
Total CHOL/HDL Ratio: 5
VLDL: 50 mg/dL — ABNORMAL HIGH (ref 0.0–40.0)

## 2014-06-26 LAB — LDL CHOLESTEROL, DIRECT: Direct LDL: 73.3 mg/dL

## 2014-07-04 NOTE — Progress Notes (Signed)
Patient informed of labs

## 2014-07-06 ENCOUNTER — Other Ambulatory Visit: Payer: Self-pay | Admitting: Interventional Cardiology

## 2014-07-11 ENCOUNTER — Other Ambulatory Visit: Payer: Self-pay | Admitting: *Deleted

## 2014-07-11 DIAGNOSIS — I6523 Occlusion and stenosis of bilateral carotid arteries: Secondary | ICD-10-CM

## 2014-07-17 ENCOUNTER — Ambulatory Visit: Payer: Medicare Other | Admitting: Physician Assistant

## 2014-07-18 ENCOUNTER — Ambulatory Visit (INDEPENDENT_AMBULATORY_CARE_PROVIDER_SITE_OTHER): Payer: Medicare Other | Admitting: Physician Assistant

## 2014-07-18 ENCOUNTER — Encounter: Payer: Self-pay | Admitting: Physician Assistant

## 2014-07-18 VITALS — BP 150/58 | HR 65 | Ht 60.0 in | Wt 185.0 lb

## 2014-07-18 DIAGNOSIS — E785 Hyperlipidemia, unspecified: Secondary | ICD-10-CM

## 2014-07-18 DIAGNOSIS — I4891 Unspecified atrial fibrillation: Secondary | ICD-10-CM

## 2014-07-18 DIAGNOSIS — I10 Essential (primary) hypertension: Secondary | ICD-10-CM

## 2014-07-18 DIAGNOSIS — I739 Peripheral vascular disease, unspecified: Secondary | ICD-10-CM

## 2014-07-18 DIAGNOSIS — I6523 Occlusion and stenosis of bilateral carotid arteries: Secondary | ICD-10-CM

## 2014-07-18 DIAGNOSIS — I2510 Atherosclerotic heart disease of native coronary artery without angina pectoris: Secondary | ICD-10-CM

## 2014-07-18 DIAGNOSIS — R202 Paresthesia of skin: Secondary | ICD-10-CM

## 2014-07-18 DIAGNOSIS — R079 Chest pain, unspecified: Secondary | ICD-10-CM

## 2014-07-18 DIAGNOSIS — E118 Type 2 diabetes mellitus with unspecified complications: Secondary | ICD-10-CM

## 2014-07-18 DIAGNOSIS — R011 Cardiac murmur, unspecified: Secondary | ICD-10-CM

## 2014-07-18 NOTE — Progress Notes (Signed)
Cardiology Office Note   Date:  07/18/2014   ID:  [name] [name], DOB 1934/02/02, MRN [id number]  PCP:  Horton Finer, MD  Cardiologist:  Dr. Casandra Doffing     History of Present Illness: [name] [name] is a 80 y.o. female CAD status post CABG in 12/345 complicated by postoperative atrial fibrillation, HTN, HL, diabetes, GERD, carotid stenosis, PAD status post angioplasty to the left SFA in 02/2012, dementia.  Last seen by Dr. Irish Lack 01/2014.  The patient is here by herself. She presents for further evaluation of chest discomfort and left arm symptoms. She developed brief episodes of chest discomfort while at rest about 3-4 weeks ago. Later that evening, she developed left arm tingling and numbness. She thinks that her arm felt somewhat weak.  She denies facial droop or difficulty with speech. The arm symptoms lasted for several hours. She's had chest pain off and on since that time. She denies significant dyspnea. She denies syncope. She denies orthopnea, PND or edema. She was seen at urgent care. She has follow-up carotid Dopplers arranged with Dr. Donnetta Hutching.   Studies:  - LHC (6/12):  Anteroapical HK, EF 50-55%, LAD 80-90%, circumflex 60% proximal and 70-80% mid, RCA 90% >> CABG (LIMA-LAD, SVG-D1, SVG-OM1, SVG-PDA)  - Nuclear (9/14):  EF 61%, no scar or ischemia  - Carotid US (10/12):  RICA 42-59%, LICA 56-38% ([facility])  - LE arteriogram (6/13):  Occluded dist L SFA >> s/p PTA to dist L SFA  Recent Labs/Images:  10/08/2013: BUN 24*; Creatinine 0.95; Hemoglobin 10.8*; Potassium 4.3; Sodium 139 03/20/2014: LDL (calc) 123* 06/26/2014: ALT 13; Direct LDL 73.3   Dg Chest 2 View   09/27/2013   IMPRESSION: 1. There are mild changes of COPD and pulmonary fibrosis. There is no evidence of pneumonia. 2. There is no evidence of CHF or pleural effusion.   Electronically Signed   By: David  Martinique   On: 09/27/2013 16:11     Wt Readings from Last 3 Encounters:  07/18/14 185 lb (83.915 kg)  01/18/14  184 lb 12.8 oz (83.825 kg)  12/08/13 187 lb (84.823 kg)     Past Medical History  Diagnosis Date  . Hypertension   . Depression   . CAD (coronary artery disease)   . GERD (gastroesophageal reflux disease)   . Bilateral carotid artery stenosis   . Diverticulitis   . Obesity   . Degenerative joint disease   . Hiatal hernia   . Dementia     early   . Atrial fibrillation with rapid ventricular response   . Hypothyroidism   . Type II diabetes mellitus   . Anemia   . Stroke 2011    "old very small TIA/neurologist"  . Chronic lower back pain     "back is very stiff because of fusion; can't stand up straight more than few moments"  . Bladder incontinence     Hx: of  . Complication of anesthesia     anesth. meds caused hallucinations   . Anxiety   . Endometriosis   . Memory loss   . Fall at home 05/15/2013  . Progressive dementia with uncertain etiology 7/56/4332    Suspect Lewy body .  [name] Bowel incontinence   . Other and unspecified hyperlipidemia   . Unspecified hypothyroidism   . Anxiety state, unspecified   . Unspecified glaucoma   . Reflux esophagitis   . Diverticulitis of colon (without mention of hemorrhage)   . Hypertonicity of bladder   .  Osteoarthrosis, unspecified whether generalized or localized, unspecified site   . Lumbago   . Muscle weakness (generalized)   . Closed fracture of unspecified part of neck of femur   . Female stress incontinence   . Depressive disorder, not elsewhere classified     Current Outpatient Prescriptions  Medication Sig Dispense Refill  . acetaminophen (TYLENOL) 500 MG tablet Take 500-1,000 mg by mouth daily as needed (for arthritis pain).    [name] ascorbic acid (VITAMIN C) 1000 MG tablet Take 1,000 mg by mouth every Monday, Wednesday, and Friday.     [name] aspirin 81 MG tablet Take 81 mg by mouth daily.    . bimatoprost (LUMIGAN) 0.01 % SOLN Place 1 drop into the right eye at bedtime.     . cholecalciferol (VITAMIN D) 1000 UNITS tablet Take  1,000 Units by mouth See admin instructions. On Monday, Tuesday, Wednesday, Friday, Saturday.    . ciprofloxacin (CIPRO) 250 MG tablet Take 250 mg by mouth 2 (two) times daily.     . ergocalciferol (VITAMIN D2) 50000 UNITS capsule Take 50,000 Units by mouth 2 (two) times a week. On Thursday and Sunday    . ezetimibe (ZETIA) 10 MG tablet Take 1 tablet (10 mg total) by mouth daily. 30 tablet 6  . felodipine (PLENDIL) 10 MG 24 hr tablet take 1 tablet by mouth once daily 30 tablet 11  . FLUoxetine (PROZAC) 20 MG capsule Take 20 mg by mouth every morning.     [name] glipiZIDE (GLUCOTROL XL) 5 MG 24 hr tablet Take 5 mg by mouth at bedtime.     . hydrOXYzine (ATARAX/VISTARIL) 25 MG tablet Take 25 mg by mouth every 6 (six) hours as needed for itching.    . Insulin Glargine (LANTUS SOLOSTAR) 100 UNIT/ML SOPN Inject 10 Units into the skin at bedtime.    [name] levothyroxine (SYNTHROID, LEVOTHROID) 125 MCG tablet Take 125 mcg by mouth daily before breakfast.     . memantine (NAMENDA) 10 MG tablet Take 10 mg by mouth daily.     . metFORMIN (GLUCOPHAGE) 500 MG tablet Take 500 mg by mouth 2 (two) times daily with a meal.    . metoprolol tartrate (LOPRESSOR) 25 MG tablet Take 25 mg by mouth 2 (two) times daily.      . mirabegron ER (MYRBETRIQ) 50 MG TB24 Take 50 mg by mouth every evening.    [name] omeprazole (PRILOSEC) 20 MG capsule Take 20 mg by mouth daily.    . pioglitazone (ACTOS) 30 MG tablet Take 30 mg by mouth every morning.     . polyethylene glycol (MIRALAX / GLYCOLAX) packet Take 17 g by mouth daily. Mix 17 gm in 4-8 oz of liquid daily    . pravastatin (PRAVACHOL) 80 MG tablet 1 tablet daily 90 tablet 2  . QUEtiapine (SEROQUEL) 25 MG tablet Take 1 tablet (25 mg total) by mouth at bedtime. May repeat once if needed 60 tablet 6  . [DISCONTINUED] amiodarone (PACERONE) 200 MG tablet Take 200 mg by mouth 3 (three) times a week.      No current facility-administered medications for this visit.     Allergies:    Vicodin; Polysaccharide iron complex; Amitriptyline; and Vesicare   Social History:  The patient  reports that she quit smoking about 26 years ago. Her smoking use included Cigarettes. She smoked 1.50 packs per day. She has never used smokeless tobacco. She reports that she does not drink alcohol or use illicit drugs.   Family History:  The patient's family history includes Cancer in her cousin and paternal aunt; Heart attack in her father and mother.   ROS:  Please see the history of present illness.   She has an occasional nonproductive cough.   All other systems reviewed and negative.    PHYSICAL EXAM: VS:  BP 150/58 mmHg  Pulse 65  Ht 5' (1.524 m)  Wt 185 lb (83.915 kg)  BMI 36.13 kg/m2 Well nourished, well developed, in no acute distress HEENT: normal Neck:  no JVD Cardiac:  normal S1, S2;  RRR; 2/6 systolic murmurLSB Lungs:   clear to auscultation bilaterally, no wheezing, rhonchi or rales Abd: soft, nontender, no hepatomegaly Ext:  Trace bilateral ankle edema Skin: warm and dry Neuro:  CNs 2-12 intact, no focal abnormalities noted  EKG:  NSR, HR 65, normal axis, nonspecific ST-T wave changes, septal Q waves      ASSESSMENT AND PLAN:  1.  Chest pain, unspecified chest pain type: Chest symptoms are somewhat atypical. However, she did have some left arm symptoms as well. She was asymptomatic prior to her bypass. It has been more than one year since her last stress test. I will arrange a Lexiscan Myoview. 2.  Coronary artery disease:  Recent chest and arm symptoms are somewhat atypical for ischemia. However, it has been more than one year since her last assessment for ischemia. As noted, she was asymptomatic prior to bypass surgery.    -  Arrange Lexiscan Myoview.    -  Continue aspirin, beta blocker, statin. 3.  Essential hypertension:  Blood pressure elevated today. Continue to monitor. 4.  Hyperlipemia: Continue statin. 5.  Peripheral vascular disease:  Stable. Continue  aspirin, statin. 6.  Bilateral carotid artery stenosis:  Follow-up carotid US planned with Dr. Donnetta Hutching next week. 7.  Type 2 diabetes mellitus with complication: Follow-up with primary care. 8.  Murmur - Plan: 2D Echocardiogram without contrast 9.  Paresthesia of left arm:  Left arm symptoms are somewhat concerning for transient ischemic attack.     -  Obtain echocardiogram as noted above.     -  Follow-up with Dr. Donnetta Hutching as planned for carotid US.     -  I have also asked the patient to follow-up with her neurologist. Question if she should undergo MRI. Defer decision to neurology.  Disposition:   FU with Dr. Casandra Doffing 1 month.   Signed, Versie Starks, MHS 07/18/2014 12:28 PM    [facility] [place], [place], [place]  [address] Phone: [phone]; Fax: [phone]

## 2014-07-18 NOTE — Patient Instructions (Signed)
Your physician recommends that you continue on your current medications as directed. Please refer to the Current Medication list given to you today.  Your physician has requested that you have an echocardiogram. Echocardiography is a painless test that uses sound waves to create images of your heart. It provides your doctor with information about the size and shape of your heart and how well your heart's chambers and valves are working. This procedure takes approximately one hour. There are no restrictions for this procedure.   Your physician has requested that you have a lexiscan myoview. For further information please visit [email or web address]. Please follow instruction sheet, as given.  Call your Neurologist. Ask for a sooner follow up appointment to rule out a stroke  Your physician recommends that you schedule a follow-up appointment in: 1 month with Dr.Varanasi

## 2014-07-21 ENCOUNTER — Telehealth: Payer: Self-pay | Admitting: Interventional Cardiology

## 2014-07-21 NOTE — Telephone Encounter (Signed)
New problem   Pt daughter need to speak to nurse concerning upcoming appts pt has, that may be a conflict.

## 2014-07-21 NOTE — Telephone Encounter (Signed)
New problem   [name] need to speak to nurse about an upcoming appt for pt.

## 2014-07-21 NOTE — Telephone Encounter (Signed)
I spoke with the patient's daughter, [name], and we discussed her the patient's appointments. She mostly was concerned about the carotid US appointment scheduled for Dr. Donnetta Hutching and if this was at his office or the hospital. I advised I thought this was at Dr. Luther Parody office, but to please call there to clarify.

## 2014-07-24 ENCOUNTER — Encounter: Payer: Self-pay | Admitting: Family

## 2014-07-25 ENCOUNTER — Ambulatory Visit (HOSPITAL_COMMUNITY)
Admission: RE | Admit: 2014-07-25 | Discharge: 2014-07-25 | Disposition: A | Discharge status: Home / Self Care | Payer: Medicare Other | Source: Ambulatory Visit | Attending: Vascular Surgery | Admitting: Vascular Surgery

## 2014-07-25 ENCOUNTER — Other Ambulatory Visit (HOSPITAL_COMMUNITY): Payer: Medicare Other

## 2014-07-25 ENCOUNTER — Ambulatory Visit (INDEPENDENT_AMBULATORY_CARE_PROVIDER_SITE_OTHER): Payer: Medicare Other | Admitting: Vascular Surgery

## 2014-07-25 ENCOUNTER — Ambulatory Visit: Payer: Medicare Other | Admitting: Vascular Surgery

## 2014-07-25 ENCOUNTER — Encounter: Payer: Self-pay | Admitting: Vascular Surgery

## 2014-07-25 VITALS — BP 153/73 | HR 61 | Resp 16 | Ht 60.0 in | Wt 184.0 lb

## 2014-07-25 DIAGNOSIS — I6523 Occlusion and stenosis of bilateral carotid arteries: Secondary | ICD-10-CM

## 2014-07-25 NOTE — Addendum Note (Signed)
Addended by: Mena Goes on: 07/25/2014 04:33 PM   Modules accepted: Orders

## 2014-07-25 NOTE — Progress Notes (Signed)
Patient name: [name] [name] MRN: [id number] DOB: 28-Mar-1934 Sex: female   Referred by: Irish Lack  Reason for referral:  Chief Complaint  Patient presents with  . Carotid    refered by Dr Irish Lack    HISTORY OF PRESENT ILLNESS: Patient presents today for follow-up of her known extracranial cerebrovascular occlusive disease. We have not seen her in several years and she was lost to protocol follow-up. Her last study in our office was in 2012. She's had multiple issues since then. She has undergone coronary artery bypass grafting. She does have a diagnosis of dementia and has significant memory loss. She is able to communicate very easily regarding her issues. Several weeks ago she did have an episode where she had a fluttering sensation in her chest and then was noted to have numbness and aching in her left arm up to her left shoulder. This persisted for several hours and had resolved by the next morning. She does not recall any focal weakness. He denies any other deficits at that time. She's had no prior other problems since that time. Does have chronic atrial fibrillation  Past Medical History  Diagnosis Date  . Hypertension   . Depression   . CAD (coronary artery disease)   . GERD (gastroesophageal reflux disease)   . Bilateral carotid artery stenosis   . Diverticulitis   . Obesity   . Degenerative joint disease   . Hiatal hernia   . Dementia     [name] [name]   . Atrial fibrillation with rapid ventricular response   . Hypothyroidism   . Type II diabetes mellitus   . Anemia   . Stroke 2011    "old very small TIA/neurologist"  . Chronic lower back pain     "back is very stiff because of fusion; can't stand up straight more than few moments"  . Bladder incontinence     Hx: of  . Complication of anesthesia     anesth. meds caused hallucinations   . Anxiety   . Endometriosis   . Memory loss   . Fall at home 05/15/2013  . Progressive dementia with uncertain etiology 4/70/9628   Suspect Lewy body .  [name] Bowel incontinence   . Other and unspecified hyperlipidemia   . Unspecified hypothyroidism   . Anxiety state, unspecified   . Unspecified glaucoma   . Reflux esophagitis   . Diverticulitis of colon (without mention of hemorrhage)   . Hypertonicity of bladder   . Osteoarthrosis, unspecified whether generalized or localized, unspecified site   . Lumbago   . Muscle weakness (generalized)   . Closed fracture of unspecified part of neck of femur   . Female stress incontinence   . Depressive disorder, not elsewhere classified     Past Surgical History  Procedure Laterality Date  . Ptca  03/10/12    LLE  . Coronary artery bypass graft  04/17/2011    LT  internal mammary artery to left anterior descending, saphenous vein graft first diagonal, saphenous  vein graft to obtuse marginal 1, saphenous vein graft to posterior descending  . Cholecystectomy    . Posterior fusion lumbar spine      "removed L4-5, S1"  . Total knee arthroplasty  ~ 2004    right  . Partial hip arthroplasty  11/2009    left; "just the ball"  . Cataract extraction w/ intraocular lens  implant, bilateral    . Neuroplasty / transposition median nerve at carpal tunnel bilateral    .  Hand surgery      "for arthritis; right hand"  . Joint replacement    . Dilation and curettage of uterus      "for endometrosis"  . Scleral buckle Right 01/20/2013    Procedure: SCLERAL BUCKLE;  Surgeon: Hayden Pedro, MD;  Location: [facility];  Service: Ophthalmology;  Laterality: Right;  . Laser photo ablation Right 01/20/2013    Procedure: LASER PHOTO ABLATION;  Surgeon: Hayden Pedro, MD;  Location: [facility];  Service: Ophthalmology;  Laterality: Right;  . Back surgery      X2  . Carpal tunnel release Bilateral   . Dilation and curettage of uterus      ENDOMETRIOSIS  . Total shoulder arthroplasty Right 10/07/2013    Procedure: TOTAL SHOULDER ARTHROPLASTY;  Surgeon: Alta Corning, MD;  Location: [facility];  Service:  Orthopedics;  Laterality: Right;    History   Social History  . Marital Status: Divorced    Spouse Name: N/A    Number of Children: 2  . Years of Education: COLLEGE   Occupational History  .      retired Pharmacist, hospital   Social History Main Topics  . Smoking status: Former Smoker -- 1.50 packs/day    Types: Cigarettes    Quit date: 09/16/1987  . Smokeless tobacco: Never Used  . Alcohol Use: No  . Drug Use: No  . Sexual Activity: No   Other Topics Concern  . Not on file   Social History Narrative   Patient is divorced and lives alone.   Patient has two adult children.   Patient is a retired Pharmacist, hospital.   Patient has a college education.   Patient is left-handed.   Patient does not drink any caffeine.    Family History  Problem Relation Age of Onset  . Cancer Paternal Aunt   . Cancer Cousin   . Heart attack Mother   . Heart attack Father     Allergies as of 07/25/2014 - Review Complete 07/25/2014  Allergen Reaction Noted  . Vicodin [hydrocodone-acetaminophen] Other (See Comments) 05/09/2011  . Polysaccharide iron complex Hives, Itching, and Rash 05/12/2011  . Amitriptyline  09/27/2013  . Vesicare [solifenacin]  09/27/2013    Current Outpatient Prescriptions on File Prior to Visit  Medication Sig Dispense Refill  . acetaminophen (TYLENOL) 500 MG tablet Take 500-1,000 mg by mouth daily as needed (for arthritis pain).    [name] ascorbic acid (VITAMIN C) 1000 MG tablet Take 1,000 mg by mouth every Monday, Wednesday, and Friday.     [name] aspirin 81 MG tablet Take 81 mg by mouth daily.    . bimatoprost (LUMIGAN) 0.01 % SOLN Place 1 drop into the right eye at bedtime.     . cholecalciferol (VITAMIN D) 1000 UNITS tablet Take 1,000 Units by mouth See admin instructions. On Monday, Tuesday, Wednesday, Friday, Saturday.    . ciprofloxacin (CIPRO) 250 MG tablet Take 250 mg by mouth 2 (two) times daily.     . ergocalciferol (VITAMIN D2) 50000 UNITS capsule Take 50,000 Units by mouth 2  (two) times a week. On Thursday and Sunday    . ezetimibe (ZETIA) 10 MG tablet Take 1 tablet (10 mg total) by mouth daily. 30 tablet 6  . felodipine (PLENDIL) 10 MG 24 hr tablet take 1 tablet by mouth once daily 30 tablet 11  . FLUoxetine (PROZAC) 20 MG capsule Take 20 mg by mouth every morning.     [name] glipiZIDE (GLUCOTROL XL) 5 MG 24 hr  tablet Take 5 mg by mouth at bedtime.     . hydrOXYzine (ATARAX/VISTARIL) 25 MG tablet Take 25 mg by mouth every 6 (six) hours as needed for itching.    . Insulin Glargine (LANTUS SOLOSTAR) 100 UNIT/ML SOPN Inject 10 Units into the skin at bedtime.    [name] levothyroxine (SYNTHROID, LEVOTHROID) 125 MCG tablet Take 125 mcg by mouth daily before breakfast.     . memantine (NAMENDA) 10 MG tablet Take 10 mg by mouth daily.     . metFORMIN (GLUCOPHAGE) 500 MG tablet Take 500 mg by mouth 2 (two) times daily with a meal.    . metoprolol tartrate (LOPRESSOR) 25 MG tablet Take 25 mg by mouth 2 (two) times daily.      . mirabegron ER (MYRBETRIQ) 50 MG TB24 Take 50 mg by mouth every evening.    [name] omeprazole (PRILOSEC) 20 MG capsule Take 20 mg by mouth daily.    . pioglitazone (ACTOS) 30 MG tablet Take 30 mg by mouth every morning.     . polyethylene glycol (MIRALAX / GLYCOLAX) packet Take 17 g by mouth daily. Mix 17 gm in 4-8 oz of liquid daily    . pravastatin (PRAVACHOL) 80 MG tablet 1 tablet daily 90 tablet 2  . QUEtiapine (SEROQUEL) 25 MG tablet Take 1 tablet (25 mg total) by mouth at bedtime. May repeat once if needed 60 tablet 6  . [DISCONTINUED] amiodarone (PACERONE) 200 MG tablet Take 200 mg by mouth 3 (three) times a week.      No current facility-administered medications on file prior to visit.     REVIEW OF SYSTEMS:  Positives indicated with an "X"  CARDIOVASCULAR:  [x]  chest pain   [ ]  chest pressure   [x ] palpitations   [ ]  orthopnea   [x ] dyspnea on exertion   [ ]  claudication   [ ]  rest pain   [ ]  DVT   [ ]  phlebitis PULMONARY:   [ ]  productive cough    [ ]  asthma   [ ]  wheezing NEUROLOGIC:   [x ] weakness  [ ]  paresthesias  [ ]  aphasia  [ ]  amaurosis  [ ]  dizziness HEMATOLOGIC:   [ ]  bleeding problems   [ ]  clotting disorders MUSCULOSKELETAL:  [ ]  joint pain   [ ]  joint swelling GASTROINTESTINAL: [ ]   blood in stool  [ ]   hematemesis GENITOURINARY:  [ ]   dysuria  [ ]   hematuria PSYCHIATRIC:  [ ]  history of major depression INTEGUMENTARY:  [ ]  rashes  [ ]  ulcers CONSTITUTIONAL:  [ ]  fever   [ ]  chills  PHYSICAL EXAMINATION:  General: The patient is a well-nourished female, in no acute distress. Vital signs are BP 153/73 mmHg  Pulse 61  Resp 16  Ht 5' (1.524 m)  Wt 184 lb (83.462 kg)  BMI 35.94 kg/m2 Pulmonary: There is a good air exchange    Musculoskeletal: There are no major deformities.  There is no significant extremity pain. Neurologic: No focal weakness or paresthesias are detected, Skin: There are no ulcer or rashes noted. Psychiatric: The patient has normal affect. Cardiovascular: There is a irregular rate and rhythm without significant murmur appreciated.   [facility] Vascular Lab Studies:  Ordered and Independently Reviewed she has had some progression of her velocities bilaterally. The left carotid upper level of the 60-70% range. Right side is the upper level of the 40-59% range  Impression and Plan:  No critical level of stenosis bilaterally  at her carotid arteries. She did have a recent event that could have been a right brain event but does not sound classic for this. She had some chest symptoms at the same time. She also was in atrial fibrillation. She is to see her neurologist for further discussion of this as well. I would not recommend endarterectomy based on my current evaluation with her. Would recommend continued six-month follow-up of her carotids and would recommend surgery only if she has clear-cut hemispheric event    Jalayah Gutridge [facility]:  [phone]

## 2014-07-26 ENCOUNTER — Encounter: Payer: Self-pay | Admitting: Physician Assistant

## 2014-07-26 ENCOUNTER — Ambulatory Visit (HOSPITAL_COMMUNITY): Payer: Medicare Other | Attending: Physician Assistant

## 2014-07-26 ENCOUNTER — Ambulatory Visit (HOSPITAL_BASED_OUTPATIENT_CLINIC_OR_DEPARTMENT_OTHER): Payer: Medicare Other | Admitting: Radiology

## 2014-07-26 DIAGNOSIS — Z794 Long term (current) use of insulin: Secondary | ICD-10-CM | POA: Diagnosis not present

## 2014-07-26 DIAGNOSIS — I10 Essential (primary) hypertension: Secondary | ICD-10-CM | POA: Insufficient documentation

## 2014-07-26 DIAGNOSIS — R079 Chest pain, unspecified: Secondary | ICD-10-CM

## 2014-07-26 DIAGNOSIS — I2510 Atherosclerotic heart disease of native coronary artery without angina pectoris: Secondary | ICD-10-CM | POA: Diagnosis not present

## 2014-07-26 DIAGNOSIS — Z8673 Personal history of transient ischemic attack (TIA), and cerebral infarction without residual deficits: Secondary | ICD-10-CM | POA: Diagnosis not present

## 2014-07-26 DIAGNOSIS — R011 Cardiac murmur, unspecified: Secondary | ICD-10-CM | POA: Insufficient documentation

## 2014-07-26 DIAGNOSIS — E119 Type 2 diabetes mellitus without complications: Secondary | ICD-10-CM | POA: Diagnosis not present

## 2014-07-26 MED ORDER — REGADENOSON 0.4 MG/5ML IV SOLN
0.4000 mg | Freq: Once | INTRAVENOUS | Status: AC
  Administered 2014-07-26: 0.4 mg via INTRAVENOUS

## 2014-07-26 MED ORDER — TECHNETIUM TC 99M SESTAMIBI GENERIC - CARDIOLITE
10.0000 | Freq: Once | INTRAVENOUS | Status: AC | PRN
  Administered 2014-07-26: 10 via INTRAVENOUS

## 2014-07-26 MED ORDER — TECHNETIUM TC 99M SESTAMIBI GENERIC - CARDIOLITE
30.0000 | Freq: Once | INTRAVENOUS | Status: AC | PRN
  Administered 2014-07-26: 30 via INTRAVENOUS

## 2014-07-26 NOTE — Progress Notes (Signed)
2D Echo completed. 07/26/2014

## 2014-07-26 NOTE — Progress Notes (Signed)
[facility] [address] [place], [place] [address] [phone]    Cardiology Nuclear Med Study  [name] [name] is a 80 y.o. female     MRN : [id number]     DOB: 20-Aug-1934  Procedure Date: 07/26/2014  Nuclear Med Background Indication for Stress Test:  Evaluation for Ischemia, Graft Patency and Abnormal EKG History:  CAD, MPI 2014 (normal) EF 61% Cardiac Risk Factors: Carotid Disease, CVA, Hypertension and IDDM   Symptoms:  Chest Pain   Nuclear Pre-Procedure Caffeine/Decaff Intake:  None> 12 hrs NPO After: 9:00am   Lungs:  clear O2 Sat: 96% on room air. IV 0.9% NS with Angio Cath:  22g  IV Site: R Antecubital x 1, tolerated well IV Started by:  Irven Baltimore, RN  Chest Size (in):  38 Cup Size: D  Height: 5' (1.524 m)  Weight:  183 lb (83.008 kg)  BMI:  Body mass index is 35.74 kg/(m^2). Tech Comments:  Full dose Lantus Insulin last night. No insulin, Glipizide, or Actos this am. CBG was 145 on arrival at 1240 today. Irven Baltimore, RN.    Nuclear Med Study 1 or 2 day study: 1 day  Stress Test Type:  Carlton Adam  Reading MD: N/A  Order Authorizing Provider:  Larae Grooms, MD  Resting Radionuclide: Technetium 41m Sestamibi  Resting Radionuclide Dose: 11.0 mCi   Stress Radionuclide:  Technetium 53m Sestamibi  Stress Radionuclide Dose: 33.0 mCi           Stress Protocol Rest HR: 62 Stress HR: 71  Rest BP: 139/54 Stress BP: 129/55  Exercise Time (min): n/a METS: n/a   Predicted Max HR: 142 bpm % Max HR: 50.7 bpm Rate Pressure Product: 10008   Dose of Adenosine (mg):  n/a Dose of Lexiscan: 0.4 mg  Dose of Atropine (mg): n/a Dose of Dobutamine: n/a mcg/kg/min (at max HR)  Stress Test Technologist: Glade Lloyd, BS-ES  Nuclear Technologist:  Earl Many, CNMT     Rest Procedure:  Myocardial perfusion imaging was performed at rest 45 minutes following the intravenous administration of Technetium 30m Sestamibi. Rest ECG: NSR - Normal  EKG  Stress Procedure:  The patient received IV Lexiscan 0.4 mg over 15-seconds.  Technetium 77m Sestamibi injected at 30-seconds.  Quantitative spect images were obtained after a 45 minute delay.  During the infusion of Lexiscan the patient complained of chest pressure, leg pressure, lower back pain, full head and throat tightness.  The symptoms began to resolve in recovery.  Stress ECG: No significant change from baseline ECG  QPS Raw Data Images:  Normal; no motion artifact; normal heart/lung ratio. Stress Images:  Small, mild apical septal perfusion defect. Rest Images:  Small, mild apical septal perfusion defect. Subtraction (SDS):  Fixed, small mild apical septal perfusion defect.  Transient Ischemic Dilatation (Normal <1.22):  1.06 Lung/Heart Ratio (Normal <0.45):  0.30  Quantitative Gated Spect Images QGS EDV:  113 ml QGS ESV:  58 ml  Impression Exercise Capacity:  Lexiscan with no exercise. BP Response:  Normal blood pressure response. Clinical Symptoms:  Chest pressure ECG Impression:  No significant ST segment change suggestive of ischemia. Comparison with Prior Nuclear Study: No images to compare  Overall Impression:  Low risk stress nuclear study with a small, mild fixed apical septal perfusion defect.  EF is 52% with mild septal hypokinesis.  No ischemia..  LV Ejection Fraction: 52%.  LV Wall Motion:  Mild septal hypokinesis.   Loralie Champagne 07/26/2014

## 2014-07-27 ENCOUNTER — Encounter: Payer: Self-pay | Admitting: Physician Assistant

## 2014-07-28 ENCOUNTER — Telehealth: Payer: Self-pay | Admitting: *Deleted

## 2014-07-28 DIAGNOSIS — I4891 Unspecified atrial fibrillation: Secondary | ICD-10-CM

## 2014-07-28 NOTE — Telephone Encounter (Signed)
pt notified of myoview and echo results with verbal understanding to results given today; Pt answers no to [employer]. question "any hx of family members dying suddenly (especially young people)". Will have [facility] schedule 24 hour holter, f/u 1 month w/Scott W, PA same day Dr. Irish Lack

## 2014-08-02 ENCOUNTER — Ambulatory Visit (INDEPENDENT_AMBULATORY_CARE_PROVIDER_SITE_OTHER): Payer: Medicare Other | Admitting: Neurology

## 2014-08-02 ENCOUNTER — Encounter: Payer: Self-pay | Admitting: Neurology

## 2014-08-02 VITALS — BP 136/55 | HR 61 | Temp 97.6°F | Resp 14 | Ht 61.5 in | Wt 186.0 lb

## 2014-08-02 DIAGNOSIS — I4819 Other persistent atrial fibrillation: Secondary | ICD-10-CM | POA: Insufficient documentation

## 2014-08-02 DIAGNOSIS — I482 Chronic atrial fibrillation, unspecified: Secondary | ICD-10-CM

## 2014-08-02 DIAGNOSIS — G458 Other transient cerebral ischemic attacks and related syndromes: Secondary | ICD-10-CM | POA: Insufficient documentation

## 2014-08-02 HISTORY — DX: Other persistent atrial fibrillation: I48.19

## 2014-08-02 HISTORY — DX: Other transient cerebral ischemic attacks and related syndromes: G45.8

## 2014-08-02 NOTE — Patient Instructions (Signed)
Transient Ischemic Attack °A transient ischemic attack (TIA) is a "warning stroke" that causes stroke-like symptoms. A TIA does not cause lasting damage to the brain. It is important to know when to get help and what to do to prevent stroke or death.  °HOME CARE  °· Take all medicines exactly as told by your doctor. Understand all your medicine instructions. °· You may need to take aspirin or warfarin medicine. Take warfarin exactly as told. °¨ Taking too much or too little warfarin is dangerous. Blood tests must be done as often as told by your doctor. These blood tests help your doctor make sure the amount of warfarin you are taking is right. A PT blood test measures how long it takes for blood to clot. Your PT is used to calculate another value called an INR. Your PT and INR help your doctor adjust your warfarin dosage. °¨ Food can cause problems with warfarin and affect the results of your blood tests. This is true for foods high in vitamin K. Spinach, kale, broccoli, cabbage, collard and turnip greens, Brussels sprouts, peas, cauliflower, seaweed, and parsley are high in vitamin K as well as beef and pork liver, green tea, and soybean oil. Eat the same amount of food high in vitamin K. Avoid major changes in your diet. Tell your doctor before changing your diet. Talk to a food specialist (dietitian) if you have questions. °¨ Many medicines can cause problems with warfarin and affect your PT and INR. Tell your doctor about all medicines you take. This includes vitamins and dietary pills (supplements). Be careful with aspirin and medicines that relieve redness, soreness, and puffiness (inflammation). Do not take or stop medicines unless your doctor tells you to. °¨ Warfarin can cause a lot of bruising or bleeding. Hold pressure over cuts for longer than normal. Talk to your doctor about other side effects of warfarin. °¨ Avoid sports or activities that may cause injury or bleeding. °¨ Be careful when you shave,  floss your teeth, or use sharp objects. °¨ Avoid alcoholic drinks or drink very little alcohol while taking warfarin. Tell your doctor if you change how much alcohol you drink. °¨ Tell your dentist and other doctors that you take warfarin before procedures. °· Eat 5 or more servings of fruits and vegetables a day. °· Follow your diet program as told, if you are given one. °· Keep a healthy weight. °· Stay active. Try to get at least 30 minutes of activity on most or all days. °· Do not smoke. °· Limit how much alcohol you drink even if you are not taking warfarin. Moderate alcohol use is: °¨ No more than 2 drinks each day for men. °¨ No more than 1 drink each day for women who are not pregnant. °· Stop abusing drugs. °· Keep your home safe so you do not fall. Try: °¨ Putting grab bars in the bedroom and bathroom. °¨ Raising toilet seats. °¨ Putting a seat in the shower. °· Keep all doctor visits a told. °GET HELP IF: °· Your personality changes. °· You have trouble swallowing. °· You are seeing two of everything. °· You are dizzy. °· You have a fever. °· Your skin starts to break down. °GET HELP RIGHT AWAY IF:  °The symptoms below may be a sign of an emergency. Do not wait to see if the symptoms go away. Call for help (911 in [place] Do not drive yourself to the hospital. °· You have sudden weakness or numbness on   the face, arm, or leg (especially on one side of the body). °· You have sudden trouble walking or moving your arms or legs. °· You have sudden confusion. °· You have trouble talking or understanding. °· You have sudden trouble seeing in one or both eyes. °· You lose your balance or your movements are not smooth. °· You have a sudden, severe headache with no known cause. °· You have new chest pain or you feel your heart beating in a unsteady way. °· You are partly or totally unaware of what is going on around you. °MAKE SURE YOU:  °· Understand these instructions. °· Will watch your condition. °· Will get  help right away if you are not doing well or get worse. °Document Released: 06/10/2008 Document Revised: 01/16/2014 Document Reviewed: 12/07/2013 °ExitCare® Patient Information ©2015 [employer]. This information is not intended to replace advice given to you by your health care provider. Make sure you discuss any questions you have with your health care provider. ° °

## 2014-08-02 NOTE — Progress Notes (Signed)
[facility]  Provider:  Larey Seat, M D  Referring Provider: Horton Finer,* Primary Care Physician:  Dorian Heckle, MD   possible R TIA in the setting of early dementia:   HPI:  [name] [name] is a 80 y.o. female  Is seen here as a  revisit after a possible TIA :    This patient had undergone a shoulder replacement in January 2015 and went again to Rehab at [facility] for a 2 month period, followed by out patient therapy until last week.  [name] [name] [place] has followed her for eye-care, and she reported her right eye remained irritated and itching. Her eyes are very dry. She has used Restasis for several years, but stopped because of the price. She is on Lumigan eye drops still. She is conversant and pleasant. The patient was not longer aware that she had gotten lost several times. The daughter reminded her that these events truly took place. She returned home and in September was seen by her cardiologist for a possible TIA , numbness of the left arm- she presented to urgent care first ( Dr. Michail Sermon is PCP) . In the past she had a transient left facial droop. She had carotid doppler study with Dr. Irish Lack in September, those were less than 70% , Dr Early agreed and  suggested q 6 month follow up , left  Carotid stenosis of 65% and 45 % on the right.  She has completely recovered. Given her chronic a fib on ASA, I would not like to add any anticoagulation- she is at high fall risk.  MOCA today 08-02-14 at only 21 -30 points, not confused, but more delayed in her responses.  She lost vision since her last retinal detachment which may account for some of the lower score. She forgot all 5 recall words.  She feels not depressed. She cries sometimes with sad movies, but this is not affect incontinence.       History : CD [name] [name] has been an established patient since 2011 in the [facility] practice. She was initially referred here for mild memory loss and  hallucinations.  The hallucinations began after a hip replacement in March 2011, after which she moved to rehabilitation at [facility]. She had experienced a noticeable difference in her handwriting and a left facial droop but a CT scan was negative for stroke.  In August 2013 she left [employer],  and then returned home and now again lives alone.  Her Mini-Mental Status Examination was 29 of 30 points or more, was reaching last time  a 30 points AFT was 10 points. Fall risk was 10 points in 2013 .  In December 2013 to return for follow up with another  MMSE., which documented 26/30 , followed by [facility] testing at  16/30 points.  I repeated the [place]  at 13/30 points.  Her fall risk was  increased to 15 points, GDS (geriatric depression score) endorsed at 5 points. The patient underwent another eye surgery on May 8th of this year. She had decreasing visual acuity , her right eye remained red, puffy  and it burned. She has gotten confused when walking to the local bank, has been getting lost on familiar roads and she is not longer permitted to drive.  The 2011  hallucinations were described as " a strong sense that a snake was in the room " ,that there were snakes in her house-  she also has heard a hissing  sounds  and rales and feared that the snake was in her room.. She called the police feel that somebody would break into a home,13 times- and as she recalled did not get a warm response. With the beginning of treatment with Seroquel , her  hallucinations resolved. She still reports vivid and at  times crazy dreams. REM BD.  There is no documented nocturnal activity for the last 12 months, but the patient also lives along and has no witnesses.  Review of Systems: Out of a complete 14 system review, the patient complains of only the following symptoms, and all other reviewed systems are negative.  vision loss, fall risk high, walks  on 4 pod cane. Often uses wheelchair for longer distance.    Memory loss, high degree of fatigue, vision is limited, burning pain in the right eye,  Had retinal detachment.  She has bowel and urinary incontinence for 24 month .   History   Social History  . Marital Status: Divorced    Spouse Name: N/A    Number of Children: 2  . Years of Education: COLLEGE   Occupational History  .      retired Pharmacist, hospital   Social History Main Topics  . Smoking status: Former Smoker -- 1.50 packs/day    Types: Cigarettes    Quit date: 09/16/1987  . Smokeless tobacco: Never Used  . Alcohol Use: No  . Drug Use: No  . Sexual Activity: No   Other Topics Concern  . Not on file   Social History Narrative   Patient is divorced and lives alone.   Patient has two adult children.   Patient is a retired Pharmacist, hospital.   Patient has a college education.   Patient is left-handed.   Patient does not drink any caffeine.    Family History  Problem Relation Age of Onset  . Cancer Paternal Aunt   . Cancer Cousin   . Heart attack Mother   . Heart attack Father     Past Medical History  Diagnosis Date  . Hypertension   . Depression   . CAD (coronary artery disease)   . GERD (gastroesophageal reflux disease)   . Bilateral carotid artery stenosis   . Diverticulitis   . Obesity   . Degenerative joint disease   . Hiatal hernia   . Atrial fibrillation   . Hypothyroidism   . Type II diabetes mellitus   . Anemia   . Stroke 2011    "old very small TIA/neurologist"  . Chronic lower back pain     "back is very stiff because of fusion; can't stand up straight more than few moments"  . Complication of anesthesia     anesth. meds caused hallucinations   . Anxiety   . Endometriosis   . Fall at home 05/15/2013  . Progressive dementia with uncertain etiology 12/15/270    Suspect Lewy body .  [name] Bowel incontinence   . HLD (hyperlipidemia)   . Unspecified glaucoma   . Reflux esophagitis   . Hypertonicity of bladder   . Osteoarthrosis, unspecified whether generalized  or localized, unspecified site   . Lumbago   . Muscle weakness (generalized)   . Closed fracture of unspecified part of neck of femur   . Female stress incontinence   . Depressive disorder, not elsewhere classified   . Hx of echocardiogram     Echo (11/15):  Mod LVH, severe septal hypertrophy (suggestive HCM), no LVOT obstruction, rest gradient 15 mmHg across AV, vigorous LVF, EF  65-70%, no RWMA, Gr 1 DD, Ao sclerosis (no stenosis), mod LAE, mild RAE, mod TR, PASP 42 mmHg  . Hx of cardiovascular stress test     Lexiscan Myoview (11/15):  Low risk stress nuclear study with a small, mild fixed apical septal perfusion defect. EF is 52% with mild septal hypokinesis. No ischemia.  . Hypertrophic cardiomyopathy     Past Surgical History  Procedure Laterality Date  . Ptca  03/10/12    LLE  . Coronary artery bypass graft  04/17/2011    LT  internal mammary artery to left anterior descending, saphenous vein graft first diagonal, saphenous  vein graft to obtuse marginal 1, saphenous vein graft to posterior descending  . Cholecystectomy    . Posterior fusion lumbar spine      "removed L4-5, S1"  . Total knee arthroplasty  ~ 2004    right  . Partial hip arthroplasty  11/2009    left; "just the ball"  . Cataract extraction w/ intraocular lens  implant, bilateral    . Neuroplasty / transposition median nerve at carpal tunnel bilateral    . Hand surgery      "for arthritis; right hand"  . Joint replacement    . Dilation and curettage of uterus      "for endometrosis"  . Scleral buckle Right 01/20/2013    Procedure: SCLERAL BUCKLE;  Surgeon: Hayden Pedro, MD;  Location: [place];  Service: Ophthalmology;  Laterality: Right;  . Laser photo ablation Right 01/20/2013    Procedure: LASER PHOTO ABLATION;  Surgeon: Hayden Pedro, MD;  Location: [place];  Service: Ophthalmology;  Laterality: Right;  . Back surgery      X2  . Carpal tunnel release Bilateral   . Dilation and curettage of uterus       ENDOMETRIOSIS  . Total shoulder arthroplasty Right 10/07/2013    Procedure: TOTAL SHOULDER ARTHROPLASTY;  Surgeon: Alta Corning, MD;  Location: [place];  Service: Orthopedics;  Laterality: Right;    Current Outpatient Prescriptions  Medication Sig Dispense Refill  . acetaminophen (TYLENOL) 500 MG tablet Take 500-1,000 mg by mouth daily as needed (for arthritis pain).    [name] ascorbic acid (VITAMIN C) 1000 MG tablet Take 1,000 mg by mouth every Monday, Wednesday, and Friday.     [name] aspirin 81 MG tablet Take 81 mg by mouth daily.    . bimatoprost (LUMIGAN) 0.01 % SOLN Place 1 drop into the right eye at bedtime.     . cholecalciferol (VITAMIN D) 1000 UNITS tablet Take 1,000 Units by mouth See admin instructions. On Monday, Tuesday, Wednesday, Friday, Saturday.    . ciprofloxacin (CIPRO) 250 MG tablet Take 250 mg by mouth 2 (two) times daily.     . ergocalciferol (VITAMIN D2) 50000 UNITS capsule Take 50,000 Units by mouth 2 (two) times a week. On Thursday and Sunday    . ezetimibe (ZETIA) 10 MG tablet Take 1 tablet (10 mg total) by mouth daily. 30 tablet 6  . felodipine (PLENDIL) 10 MG 24 hr tablet take 1 tablet by mouth once daily 30 tablet 11  . FLUoxetine (PROZAC) 20 MG capsule Take 20 mg by mouth every morning.     [name] glipiZIDE (GLUCOTROL XL) 5 MG 24 hr tablet Take 5 mg by mouth at bedtime.     . Insulin Glargine (LANTUS SOLOSTAR) 100 UNIT/ML SOPN Inject 10 Units into the skin at bedtime.    [name] levothyroxine (SYNTHROID, LEVOTHROID) 125 MCG tablet Take 125 mcg  by mouth daily before breakfast.     . memantine (NAMENDA) 10 MG tablet Take 10 mg by mouth daily.     . metFORMIN (GLUCOPHAGE) 500 MG tablet Take 500 mg by mouth 2 (two) times daily with a meal.    . metoprolol tartrate (LOPRESSOR) 25 MG tablet Take 25 mg by mouth 2 (two) times daily.      . mirabegron ER (MYRBETRIQ) 50 MG TB24 Take 50 mg by mouth every evening.    [name] omeprazole (PRILOSEC) 20 MG capsule Take 20 mg by mouth daily.    .  pioglitazone (ACTOS) 30 MG tablet Take 30 mg by mouth every morning.     . polyethylene glycol (MIRALAX / GLYCOLAX) packet Take 17 g by mouth daily. Mix 17 gm in 4-8 oz of liquid daily    . pravastatin (PRAVACHOL) 80 MG tablet 1 tablet daily 90 tablet 2  . QUEtiapine (SEROQUEL) 25 MG tablet Take 1 tablet (25 mg total) by mouth at bedtime. May repeat once if needed 60 tablet 6  . [DISCONTINUED] amiodarone (PACERONE) 200 MG tablet Take 200 mg by mouth 3 (three) times a week.      No current facility-administered medications for this visit.    Allergies as of 08/02/2014 - Review Complete 08/02/2014  Allergen Reaction Noted  . Vicodin [hydrocodone-acetaminophen] Other (See Comments) 05/09/2011  . Polysaccharide iron complex Hives, Itching, and Rash 05/12/2011  . Amitriptyline  09/27/2013  . Vesicare [solifenacin]  09/27/2013    Vitals: BP 136/55 mmHg  Pulse 61  Temp(Src) 97.6 F (36.4 C) (Oral)  Resp 14  Ht 5' 1.5" (1.562 m)  Wt 186 lb (84.369 kg)  BMI 34.58 kg/m2 Last Weight:  Wt Readings from Last 1 Encounters:  08/02/14 186 lb (84.369 kg)   Last Height:   Ht Readings from Last 1 Encounters:  08/02/14 5' 1.5" (1.562 m)    Physical exam:  General: The patient is awake, alert and appears not in acute distress. The patient is well groomed. Head: Normocephalic, atraumatic. Neck is supple.  Cardiovascular:  irregular rate and rhythm without carotid bruit, and without distended neck veins. Respiratory: Lungs are clear to auscultation. Skin:  Without evidence of edema, or rash Trunk: BMI is elevated . The  patient has fallen in June 2014, none since, uses 4 prong cane.    Neurologic exam : The patient is awake and alert, oriented to place and time.  Memory subjective  described as declining ,  MOCA last visit  was remarkable at 24 points. Today 08-02-14 , 21-30 points.   There is no longer a normal attention span & concentration ability.  Speech is fluent without dysarthria,  dysphonia or aphasia.  Mood and affect are appropriate. She is happy.  Cranial nerves: Pupils are equal and briskly reactive to light. Her right eye is reddish swollen. Extraocular movements  in vertical and horizontal planes intact and without nystagmus. Visual fields by finger perimetry are intact. Hearing to finger rub intact.  Facial sensation intact to fine touch.  Facial motor strength is symmetric - there is a hint of left facial droop - and tongue and uvula move midline.  Motor exam:    normal strength in upper extremities. Good grip strength. She has restored her ROM for the shoulder.   Sensory:  Fine touch, pinprick and vibration were tested in all extremities normal. No numbness today.    Coordination: Rapid alternating movements in the fingers/hands are slow .  Finger-to-nose maneuver tested and normal  without evidence of ataxia, dysmetria or tremor.   Gait and station: Patient walks with assistive device, very slow and leaning to the left.    Deep tendon reflexes: in the  upper and lower extremities are symmetric and intact. Babinski maneuver response is still downgoing.   Assessment:  Advancing memory los, inability to multitask, delay in motor responses and poor vision.   The patient is not allowed to drive any longer.    DEMENTIA versus mild cognitive impairment. Lewy body or frontal lobe dementia were suspected, but she no longer has hallucinations of auditory character on medication.   Her MOCA today was much better than the last, which can be seen in day to day variabilty in lewy body. She lacks the cogwheeling.   Dicussed driving restrictions - she should not even drive shopping or in her residential area.  She did have orthostatic dizziness at times. Wheel chair prevents falls.

## 2014-08-04 ENCOUNTER — Encounter (INDEPENDENT_AMBULATORY_CARE_PROVIDER_SITE_OTHER): Payer: Medicare Other

## 2014-08-04 ENCOUNTER — Encounter: Payer: Self-pay | Admitting: Radiology

## 2014-08-04 DIAGNOSIS — I4891 Unspecified atrial fibrillation: Secondary | ICD-10-CM

## 2014-08-04 NOTE — Progress Notes (Signed)
Patient ID: [name] [name], female   DOB: December 23, 1933, 80 y.o.   MRN: [id number] [employer] 24hr holter applied

## 2014-08-09 ENCOUNTER — Telehealth: Payer: Self-pay | Admitting: Interventional Cardiology

## 2014-08-09 NOTE — Telephone Encounter (Signed)
The patient was notified of her holter monitor. Per Dr. Irish Lack- " NSR/ PAC's/ PVC's/ no pathogenic arrhythmias."  She states that she has had a few episodes of numbness and tingling in her hand that go up her arm. She has reported at office visits she has had. I advised that she monitor symptoms and if they become persistent and do not go away quickly she report to the ER for evaluation. She is agreeable. She has a follow up appointment scheduled for 12/14 with Richardson Dopp, PA.

## 2014-08-22 ENCOUNTER — Ambulatory Visit: Payer: Medicare Other | Admitting: Vascular Surgery

## 2014-08-22 ENCOUNTER — Other Ambulatory Visit (HOSPITAL_COMMUNITY): Payer: Medicare Other

## 2014-08-24 ENCOUNTER — Encounter (HOSPITAL_COMMUNITY): Payer: Self-pay | Admitting: Interventional Cardiology

## 2014-08-24 ENCOUNTER — Ambulatory Visit: Payer: Medicare Other | Admitting: Adult Health

## 2014-08-28 ENCOUNTER — Ambulatory Visit: Payer: Medicare Other | Admitting: Physician Assistant

## 2014-08-30 ENCOUNTER — Encounter: Payer: Self-pay | Admitting: Physician Assistant

## 2014-08-30 ENCOUNTER — Ambulatory Visit (INDEPENDENT_AMBULATORY_CARE_PROVIDER_SITE_OTHER): Payer: Medicare Other | Admitting: Physician Assistant

## 2014-08-30 VITALS — BP 120/52 | HR 61 | Ht 61.5 in | Wt 187.0 lb

## 2014-08-30 DIAGNOSIS — I422 Other hypertrophic cardiomyopathy: Secondary | ICD-10-CM | POA: Insufficient documentation

## 2014-08-30 DIAGNOSIS — I10 Essential (primary) hypertension: Secondary | ICD-10-CM

## 2014-08-30 DIAGNOSIS — E785 Hyperlipidemia, unspecified: Secondary | ICD-10-CM

## 2014-08-30 DIAGNOSIS — I6523 Occlusion and stenosis of bilateral carotid arteries: Secondary | ICD-10-CM

## 2014-08-30 DIAGNOSIS — I2510 Atherosclerotic heart disease of native coronary artery without angina pectoris: Secondary | ICD-10-CM

## 2014-08-30 NOTE — Progress Notes (Signed)
Cardiology Office Note   Date:  08/30/2014   ID:  [name] [name], DOB Nov 28, 1933, MRN [id number]  PCP:  Dorian Heckle, MD  Cardiologist:  Dr. Casandra Doffing     History of Present Illness: [name] [name] is a 80 y.o. female CAD status post CABG in 04/6766 complicated by postoperative atrial fibrillation, HTN, HL, diabetes, GERD, carotid stenosis, PAD status post angioplasty to the left SFA in 02/2012, dementia.    I saw her last month with complaints of chest pain and left arm pain. She was also noted to have a systolic murmur on exam. Myoview was performed.  This demonstrated no ischemia.  Echo demonstrated moderate LVH with evidence of HCM without significant LVOT obstruction with EF 65-70%.   24-hour Holter monitor demonstrated NSR, PACs and PVCs but no significant arrhythmia. She had some L sided paresthesias when I saw her and has already FU with neurology.  She returns for FU.  She is here today with her friend. She denies any further chest pain. She denies significant dyspnea. She is overall fairly sedentary. She is probably NYHA 2-2b. She denies orthopnea, PND or edema. She denies syncope.  She got a little winded today coming back to the exam room.   Studies:  - LHC (6/12):  Anteroapical HK, EF 50-55%, LAD 80-90%, circumflex 60% proximal and 70-80% mid, RCA 90% >> CABG (LIMA-LAD, SVG-D1, SVG-OM1, SVG-PDA)  - Nuclear (9/14):  EF 61%, no scar or ischemia  - Carotid US (10/12):  RICA 20-94%, LICA 70-96% (VVS)  - Carotid US (11/15):  RICA 28-36%; LICA 62-94% (VVS)  - LE arteriogram (6/13):  Occluded dist L SFA >> s/p PTA to dist L SFA  Myoview 07/26/14: Low risk stress nuclear study with a small, mild fixed apical septal perfusion defect. EF is 52% with mild septal hypokinesis. No ischemia. EF 52%. LV Wall Motion: Mild septal hypokinesis.   Echocardiogram 07/26/14: Study Conclusions - Left ventricle: The cavity size was normal. There was moderate concentric hypertrophy. Severe  septal hypertrophy suggestive of hypertrophic cardiomyopathy. No LVOT obstruction is noted - there is a 15 mmHg rest gradient across the AOV. Systolic function was vigorous. The estimated ejection fraction was in the range of 65% to 70%. Wall motion was normal; there were no regional wall motion abnormalities.  - Grade 1 diastolic dysfunction. - Aortic valve: Trileaflet. Sclerosis without stenosis. There was no regurgitation. - Left atrium: The atrium was moderately dilated (40 ml/m2). - Right atrium: The atrium was mildly dilated. - Tricuspid valve: There was moderate regurgitation. - Pulmonary arteries: PA peak pressure: 42 mm Hg (S).  Impressions: - LVEF 65-70%, moderate LVH, severe septal hypertrophy concerning for HCM, no significant LVOT gradient, aortic sclerosis with mean gradient of 15 mmHg across the valve. Moderate LAE. Moderate TR, RVSP 42 mmHg.  24-hour Holter 08/04/14:  NSR, PACs and PVCs but no significant arrhythmia.   Recent Labs/Images:  10/08/2013: BUN 24*; Creatinine 0.95; Hemoglobin 10.8*; Potassium 4.3; Sodium 139 03/20/2014: LDL (calc) 123* 06/26/2014: ALT 13; Direct LDL 73.3     Wt Readings from Last 3 Encounters:  08/30/14 187 lb (84.823 kg)  08/02/14 186 lb (84.369 kg)  07/26/14 183 lb (83.008 kg)     Past Medical History  Diagnosis Date  . Hypertension   . Depression   . CAD (coronary artery disease)   . GERD (gastroesophageal reflux disease)   . Bilateral carotid artery stenosis   . Diverticulitis   . Obesity   . Degenerative joint  disease   . Hiatal hernia   . Atrial fibrillation   . Hypothyroidism   . Type II diabetes mellitus   . Anemia   . Stroke 2011    "old very small TIA/neurologist"  . Chronic lower back pain     "back is very stiff because of fusion; can't stand up straight more than few moments"  . Complication of anesthesia     anesth. meds caused hallucinations   . Anxiety   . Endometriosis   . Fall at home 05/15/2013  .  Progressive dementia with uncertain etiology 6/71/2458    Suspect Lewy body .  [name] Bowel incontinence   . HLD (hyperlipidemia)   . Unspecified glaucoma   . Reflux esophagitis   . Hypertonicity of bladder   . Osteoarthrosis, unspecified whether generalized or localized, unspecified site   . Lumbago   . Muscle weakness (generalized)   . Closed fracture of unspecified part of neck of femur   . Female stress incontinence   . Depressive disorder, not elsewhere classified   . Hx of echocardiogram     Echo (11/15):  Mod LVH, severe septal hypertrophy (suggestive HCM), no LVOT obstruction, rest gradient 15 mmHg across AV, vigorous LVF, EF 65-70%, no RWMA, Gr 1 DD, Ao sclerosis (no stenosis), mod LAE, mild RAE, mod TR, PASP 42 mmHg  . Hx of cardiovascular stress test     Lexiscan Myoview (11/15):  Low risk stress nuclear study with a small, mild fixed apical septal perfusion defect. EF is 52% with mild septal hypokinesis. No ischemia.  . Hypertrophic cardiomyopathy     Current Outpatient Prescriptions  Medication Sig Dispense Refill  . acetaminophen (TYLENOL) 500 MG tablet Take 500-1,000 mg by mouth daily as needed (for arthritis pain).    [name] ascorbic acid (VITAMIN C) 1000 MG tablet Take 1,000 mg by mouth every Monday, Wednesday, and Friday.     [name] aspirin 81 MG tablet Take 81 mg by mouth daily.    . bimatoprost (LUMIGAN) 0.01 % SOLN Place 1 drop into the right eye at bedtime.     . cholecalciferol (VITAMIN D) 1000 UNITS tablet Take 1,000 Units by mouth See admin instructions. On Monday, Tuesday, Wednesday, Friday, Saturday.    . ergocalciferol (VITAMIN D2) 50000 UNITS capsule Take 50,000 Units by mouth 2 (two) times a week. On Thursday and Sunday    . ezetimibe (ZETIA) 10 MG tablet Take 1 tablet (10 mg total) by mouth daily. 30 tablet 6  . felodipine (PLENDIL) 10 MG 24 hr tablet take 1 tablet by mouth once daily 30 tablet 11  . FLUoxetine (PROZAC) 20 MG capsule Take 20 mg by mouth every morning.      [name] glipiZIDE (GLUCOTROL XL) 5 MG 24 hr tablet Take 5 mg by mouth at bedtime.     . Insulin Glargine (LANTUS SOLOSTAR) 100 UNIT/ML SOPN Inject 10 Units into the skin at bedtime.    [name] levothyroxine (SYNTHROID, LEVOTHROID) 125 MCG tablet Take 125 mcg by mouth daily before breakfast.     . memantine (NAMENDA) 10 MG tablet Take 10 mg by mouth daily.     . metFORMIN (GLUCOPHAGE) 500 MG tablet Take 500 mg by mouth 2 (two) times daily with a meal.    . metoprolol tartrate (LOPRESSOR) 25 MG tablet Take 25 mg by mouth 2 (two) times daily.      . mirabegron ER (MYRBETRIQ) 50 MG TB24 Take 50 mg by mouth every evening.    [name] omeprazole (PRILOSEC)  20 MG capsule Take 20 mg by mouth daily.    . pioglitazone (ACTOS) 30 MG tablet Take 30 mg by mouth every morning.     . polyethylene glycol (MIRALAX / GLYCOLAX) packet Take 17 g by mouth daily. Mix 17 gm in 4-8 oz of liquid daily    . pravastatin (PRAVACHOL) 80 MG tablet 1 tablet daily 90 tablet 2  . QUEtiapine (SEROQUEL) 25 MG tablet Take 1 tablet (25 mg total) by mouth at bedtime. May repeat once if needed 60 tablet 6  . [DISCONTINUED] amiodarone (PACERONE) 200 MG tablet Take 200 mg by mouth 3 (three) times a week.      No current facility-administered medications for this visit.     Allergies:   Vicodin; Polysaccharide iron complex; Amitriptyline; and Vesicare   Social History:  The patient  reports that she quit smoking about 26 years ago. Her smoking use included Cigarettes. She smoked 1.50 packs per day. She has never used smokeless tobacco. She reports that she does not drink alcohol or use illicit drugs.   Family History:  The patient's family history includes Cancer in her cousin and paternal aunt; Heart attack in her father and mother.   ROS:  Please see the history of present illness.     All other systems reviewed and negative.    PHYSICAL EXAM: VS:  BP 120/52 mmHg  Pulse 61  Ht 5' 1.5" (1.562 m)  Wt 187 lb (84.823 kg)  BMI 34.77  kg/m2 Well nourished, well developed, in no acute distress HEENT: normal Neck:  no JVD at 90  Cardiac:  normal S1, S2;  RRR; no murmur  Lungs:   clear to auscultation bilaterally, no wheezing, rhonchi or rales Abd: soft, nontender, no hepatomegaly Ext:  no edema Skin: warm and dry Neuro:  CNs 2-12 intact, no focal abnormalities noted  EKG:NSR, HR 61, normal axis, nonspecific ST-T wave changes, no change from prior tracing      ASSESSMENT AND PLAN:  1.  Coronary artery disease:  No recurrent chest or arm symptoms. Recent Myoview low risk.  No further workup.    -  Continue aspirin, beta blocker, statin. 2.  Hypertrophic Cardiomyopathy:  Recent Holter without evidence of nonsustained ventricular tachycardia. Her blood pressure is well controlled and she is on a beta blocker. She had no LVOT obstruction on recent echocardiogram. 3.  Essential hypertension:  Controlled. 4.  Hyperlipemia: Continue statin. 5.  Peripheral vascular disease:  Stable. Continue aspirin, statin. 6.  Bilateral carotid artery stenosis:   Recent carotid duplex with stable bilateral disease.  Follow-up with Dr. Donnetta Hutching as planned.    Disposition:   FU with Dr. Casandra Doffing 3 mos.   Signed, Versie Starks, MHS 08/30/2014 2:44 PM    [facility] [place], [place], [place]  [address] Phone: [phone]; Fax: [phone]

## 2014-08-30 NOTE — Patient Instructions (Signed)
Your physician recommends that you schedule a follow-up appointment in: 3 MONTHS WITH DR. VARANASI  NO CHANGES WERE MADE TODAY

## 2014-09-19 ENCOUNTER — Ambulatory Visit: Payer: Medicare Other | Admitting: Interventional Cardiology

## 2014-10-25 ENCOUNTER — Ambulatory Visit (INDEPENDENT_AMBULATORY_CARE_PROVIDER_SITE_OTHER): Payer: Medicare Other | Admitting: Ophthalmology

## 2014-10-25 DIAGNOSIS — E11311 Type 2 diabetes mellitus with unspecified diabetic retinopathy with macular edema: Secondary | ICD-10-CM

## 2014-10-25 DIAGNOSIS — E11359 Type 2 diabetes mellitus with proliferative diabetic retinopathy without macular edema: Secondary | ICD-10-CM

## 2014-10-25 DIAGNOSIS — H43813 Vitreous degeneration, bilateral: Secondary | ICD-10-CM

## 2014-10-25 DIAGNOSIS — E11331 Type 2 diabetes mellitus with moderate nonproliferative diabetic retinopathy with macular edema: Secondary | ICD-10-CM

## 2014-10-25 DIAGNOSIS — I10 Essential (primary) hypertension: Secondary | ICD-10-CM

## 2014-10-25 DIAGNOSIS — H35033 Hypertensive retinopathy, bilateral: Secondary | ICD-10-CM

## 2014-11-06 ENCOUNTER — Other Ambulatory Visit: Payer: Self-pay | Admitting: Neurology

## 2014-11-22 ENCOUNTER — Other Ambulatory Visit: Payer: Self-pay | Admitting: Neurology

## 2014-11-23 NOTE — Telephone Encounter (Signed)
Pharmacy requesting refill.  It does not appear we have refilled this in Epic.  Okay to send Rx?  Please advise.  Thank you.

## 2014-12-07 ENCOUNTER — Other Ambulatory Visit: Payer: Self-pay

## 2014-12-07 ENCOUNTER — Encounter: Payer: Self-pay | Admitting: Interventional Cardiology

## 2014-12-07 ENCOUNTER — Ambulatory Visit (INDEPENDENT_AMBULATORY_CARE_PROVIDER_SITE_OTHER): Payer: Medicare Other | Admitting: Interventional Cardiology

## 2014-12-07 VITALS — BP 150/64 | HR 60 | Ht 61.0 in | Wt 185.0 lb

## 2014-12-07 DIAGNOSIS — I2510 Atherosclerotic heart disease of native coronary artery without angina pectoris: Secondary | ICD-10-CM

## 2014-12-07 DIAGNOSIS — F0390 Unspecified dementia without behavioral disturbance: Secondary | ICD-10-CM

## 2014-12-07 DIAGNOSIS — I739 Peripheral vascular disease, unspecified: Secondary | ICD-10-CM | POA: Diagnosis not present

## 2014-12-07 DIAGNOSIS — E785 Hyperlipidemia, unspecified: Secondary | ICD-10-CM

## 2014-12-07 DIAGNOSIS — I10 Essential (primary) hypertension: Secondary | ICD-10-CM | POA: Diagnosis not present

## 2014-12-07 MED ORDER — EZETIMIBE 10 MG PO TABS: 10.0000 mg | ORAL_TABLET | Freq: Every day | ORAL | Status: DC

## 2014-12-07 MED ORDER — FELODIPINE ER 10 MG PO TB24: ORAL_TABLET | ORAL | Status: DC

## 2014-12-07 NOTE — Progress Notes (Signed)
Patient ID: [name] [name], female   DOB: 1934-09-07, 81 y.o.   MRN: [id number]     Cardiology Office Note   Date:  12/07/2014   [name] [name], DOB [birth year], MRN [id number]  PCP:  Dorian Heckle, MD  Cardiologist:  Jettie Booze., MD   No chief complaint on file.     History of Present Illness: [name] [name] is a 81 y.o. female who is status post CABG in 02/3784 complicated by postoperative atrial fibrillation, HTN, HL, diabetes, GERD, carotid stenosis, PAD status post angioplasty to the left SFA in 02/2012 for non healing foot ulcer, dementia.  She had  A negative stress test in late 2015.  She has had some intermittent CP not related to exertion.  She walks slowly with a cane.  She notes that her memory is getting worse.   She follows with Dr. Donnetta Hutching for carotid disease.   She has not been checking BP regularly outside of MD office.     Past Medical History  Diagnosis Date  . Hypertension   . Depression   . CAD (coronary artery disease)   . GERD (gastroesophageal reflux disease)   . Bilateral carotid artery stenosis   . Diverticulitis   . Obesity   . Degenerative joint disease   . Hiatal hernia   . Atrial fibrillation   . Hypothyroidism   . Type II diabetes mellitus   . Anemia   . Stroke 2011    "old very small TIA/neurologist"  . Chronic lower back pain     "back is very stiff because of fusion; can't stand up straight more than few moments"  . Complication of anesthesia     anesth. meds caused hallucinations   . Anxiety   . Endometriosis   . Fall at home 05/15/2013  . Progressive dementia with uncertain etiology 8/85/0277    Suspect Lewy body .  [name] Bowel incontinence   . HLD (hyperlipidemia)   . Unspecified glaucoma   . Reflux esophagitis   . Hypertonicity of bladder   . Osteoarthrosis, unspecified whether generalized or localized, unspecified site   . Lumbago   . Muscle weakness (generalized)   . Closed fracture of unspecified part of neck of  femur   . Female stress incontinence   . Depressive disorder, not elsewhere classified   . Hx of echocardiogram     Echo (11/15):  Mod LVH, severe septal hypertrophy (suggestive HCM), no LVOT obstruction, rest gradient 15 mmHg across AV, vigorous LVF, EF 65-70%, no RWMA, Gr 1 DD, Ao sclerosis (no stenosis), mod LAE, mild RAE, mod TR, PASP 42 mmHg  . Hx of cardiovascular stress test     Lexiscan Myoview (11/15):  Low risk stress nuclear study with a small, mild fixed apical septal perfusion defect. EF is 52% with mild septal hypokinesis. No ischemia.  . Hypertrophic cardiomyopathy     Past Surgical History  Procedure Laterality Date  . Ptca  03/10/12    LLE  . Coronary artery bypass graft  04/17/2011    LT  internal mammary artery to left anterior descending, saphenous vein graft first diagonal, saphenous  vein graft to obtuse marginal 1, saphenous vein graft to posterior descending  . Cholecystectomy    . Posterior fusion lumbar spine      "removed L4-5, S1"  . Total knee arthroplasty  ~ 2004    right  . Partial hip arthroplasty  11/2009    left; "just the ball"  . Cataract  extraction w/ intraocular lens  implant, bilateral    . Neuroplasty / transposition median nerve at carpal tunnel bilateral    . Hand surgery      "for arthritis; right hand"  . Joint replacement    . Dilation and curettage of uterus      "for endometrosis"  . Scleral buckle Right 01/20/2013    Procedure: SCLERAL BUCKLE;  Surgeon: Hayden Pedro, MD;  Location: [facility];  Service: Ophthalmology;  Laterality: Right;  . Laser photo ablation Right 01/20/2013    Procedure: LASER PHOTO ABLATION;  Surgeon: Hayden Pedro, MD;  Location: [facility];  Service: Ophthalmology;  Laterality: Right;  . Back surgery      X2  . Carpal tunnel release Bilateral   . Dilation and curettage of uterus      ENDOMETRIOSIS  . Total shoulder arthroplasty Right 10/07/2013    Procedure: TOTAL SHOULDER ARTHROPLASTY;  Surgeon: Alta Corning, MD;   Location: [facility];  Service: Orthopedics;  Laterality: Right;  . Lower extremity angiogram N/A 03/10/2012    Procedure: LOWER EXTREMITY ANGIOGRAM;  Surgeon: Jettie Booze, MD;  Location: [facility] C[facility];  Service: Cardiovascular;  Laterality: N/A;     Current Outpatient Prescriptions  Medication Sig Dispense Refill  . acetaminophen (TYLENOL) 500 MG tablet Take 500-1,000 mg by mouth daily as needed (for arthritis pain).    [name] ascorbic acid (VITAMIN C) 1000 MG tablet Take 1,000 mg by mouth every Monday, Wednesday, and Friday.     [name] aspirin 81 MG tablet Take 81 mg by mouth daily.    . bimatoprost (LUMIGAN) 0.01 % SOLN Place 1 drop into the right eye at bedtime.     . cholecalciferol (VITAMIN D) 1000 UNITS tablet Take 1,000 Units by mouth See admin instructions. On Monday, Tuesday, Wednesday, Friday, Saturday.    . ergocalciferol (VITAMIN D2) 50000 UNITS capsule Take 50,000 Units by mouth 2 (two) times a week. On Thursday and Sunday    . ezetimibe (ZETIA) 10 MG tablet Take 1 tablet (10 mg total) by mouth daily. 30 tablet 3  . felodipine (PLENDIL) 10 MG 24 hr tablet Take two tablets by mouth daily. 60 tablet 11  . FLUoxetine (PROZAC) 20 MG capsule Take 20 mg by mouth every morning.     [name] glipiZIDE (GLUCOTROL XL) 5 MG 24 hr tablet Take 5 mg by mouth at bedtime.     . Insulin Glargine (LANTUS SOLOSTAR) 100 UNIT/ML SOPN Inject 10 Units into the skin at bedtime.    [name] levothyroxine (SYNTHROID, LEVOTHROID) 125 MCG tablet Take 125 mcg by mouth daily before breakfast.     . memantine (NAMENDA) 10 MG tablet Take 10 mg by mouth daily.     . metFORMIN (GLUCOPHAGE) 500 MG tablet Take 500 mg by mouth 2 (two) times daily with a meal.    . metoprolol tartrate (LOPRESSOR) 25 MG tablet Take 25 mg by mouth 2 (two) times daily.      . mirabegron ER (MYRBETRIQ) 50 MG TB24 Take 50 mg by mouth every evening.    [name] NAMENDA 10 MG tablet take 1 tablet by mouth twice a day 180 tablet 0  . omeprazole (PRILOSEC) 20 MG capsule  Take 20 mg by mouth daily.    . pioglitazone (ACTOS) 30 MG tablet Take 30 mg by mouth every morning.     . polyethylene glycol (MIRALAX / GLYCOLAX) packet Take 17 g by mouth daily. Mix 17 gm in 4-8 oz of liquid daily    .  pravastatin (PRAVACHOL) 80 MG tablet 1 tablet daily 90 tablet 2  . QUEtiapine (SEROQUEL) 25 MG tablet TAKE 1 TABLET BY MOUTH AT BEDTIME; MAY REPEAT ONCE IF NEEDED 60 tablet 6  . [DISCONTINUED] amiodarone (PACERONE) 200 MG tablet Take 200 mg by mouth 3 (three) times a week.      No current facility-administered medications for this visit.    Allergies:   Vicodin; Polysaccharide iron complex; Amitriptyline; and Vesicare    Social History:  The patient  reports that she quit smoking about 27 years ago. Her smoking use included Cigarettes. She smoked 1.50 packs per day. She has never used smokeless tobacco. She reports that she does not drink alcohol or use illicit drugs.   Family History:  The patient's family history includes Cancer in her cousin and paternal aunt; Heart attack in her father and mother.    ROS:  Please see the history of present illness.   Otherwise, review of systems are positive for arthritic joint pain- limits walking.   All other systems are reviewed and negative.    PHYSICAL EXAM: VS:  BP 150/64 mmHg  Pulse 60  Ht 5\' 1"  (1.549 m)  Wt 185 lb (83.915 kg)  BMI 34.97 kg/m2 , BMI Body mass index is 34.97 kg/(m^2). GEN: Well nourished, well developed, in no acute distress HEENT: normal Neck: no JVD, carotid bruits, or masses Cardiac: *RRR; no murmurs, rubs, or gallops,no edema , diminished pedal pulses; dry skin area on the left foot Respiratory:  clear to auscultation bilaterally, normal work of breathing GI: soft, nontender, nondistended, + BS MS: no deformity or atrophy Skin: warm and dry, no rash, dry skin on side of left foot Neuro:  Strength and sensation are intact Psych: euthymic mood, full affect   EKG:  EKG is not ordered  today.    Recent Labs: 06/26/2014: ALT 13    Lipid Panel    Component Value Date/Time   CHOL 157 06/26/2014 1132   TRIG 250.0* 06/26/2014 1132   HDL 30.80* 06/26/2014 1132   CHOLHDL 5 06/26/2014 1132   VLDL 50.0* 06/26/2014 1132   LDLCALC 123* 03/20/2014 1107   LDLDIRECT 73.3 06/26/2014 1132      Wt Readings from Last 3 Encounters:  12/07/14 185 lb (83.915 kg)  08/30/14 187 lb (84.823 kg)  08/02/14 186 lb (84.369 kg)      Other studies Reviewed: Additional studies/ records that were reviewed today include: Cardiolite: no ischemia- 2015   ASSESSMENT AND PLAN:  1.   Essential hypertension, benign  Continue Metoprolol Tartrate Tablet, 25 MG, take 1 tablet by mouth twice a day for hypertension Increase Felodipine Tablet Extended Release 24 Hour, 20 MG, 1 tablet, Orally, Once a day Notes: Elevated today. continue to follow blood pressure at home. If She continues to get elevated readings over 150/90, would have to adjust medication further. Next BP check with PMD.  SHe can call us with home results.   2. Coronary artery disease  Notes: No angina. status post bypass surgery in 2012. Her coronary artery disease was discovered during part of her preoperative workup for shoulder surgery.  Negative stress test in 2015.   3. Peripheral Vascular Disease  Notes: Left SFA angioplasty for foot ulcer in 2013. Ulcer has healed but she has some area of dry skin on the left side of the foot. No breakdown of skin apparent.  4. Hyperlipidemia: LDL 132 in 12/14. Increased pravastatin to 80 mg daily. Better control. In 10/15, LDL down to  42.   Preventive Medicine  Adult topics discussed:  Diet: healthy diet.  Exercise: 5 days a week, at least 30 minutes of aerobic exercise. Avoid falling            Current medicines are reviewed at length with the patient today.  The patient has concerns regarding medicines.  The following changes have been made:  Increase felodipine    Labs/ tests ordered today include: none No orders of the defined types were placed in this encounter.     Disposition:   FU with me in 1 year   SignedJettie Booze., MD  12/07/2014 3:22 PM    [facility] [address], [place], [place]  [address] Phone: [phone]; Fax: [phone]

## 2014-12-07 NOTE — Patient Instructions (Addendum)
Your physician has recommended you make the following change in your medication:  1) INCREASE Felodpine to 20 mg, two tablets by mouth daily.   Your physician wants you to follow-up in: 1 year with Dr. Irish Lack. You will receive a reminder letter in the mail two months in advance. If you don't receive a letter, please call our office to schedule the follow-up appointment.

## 2015-01-19 ENCOUNTER — Encounter: Payer: Self-pay | Admitting: Family

## 2015-01-23 ENCOUNTER — Other Ambulatory Visit (HOSPITAL_COMMUNITY): Payer: Medicare Other

## 2015-01-23 ENCOUNTER — Encounter: Payer: Self-pay | Admitting: Family

## 2015-01-23 ENCOUNTER — Ambulatory Visit (INDEPENDENT_AMBULATORY_CARE_PROVIDER_SITE_OTHER): Payer: Medicare Other | Admitting: Family

## 2015-01-23 ENCOUNTER — Ambulatory Visit (HOSPITAL_COMMUNITY)
Admission: RE | Admit: 2015-01-23 | Discharge: 2015-01-23 | Disposition: A | Discharge status: Home / Self Care | Payer: Medicare Other | Source: Ambulatory Visit | Attending: Family | Admitting: Family

## 2015-01-23 ENCOUNTER — Ambulatory Visit: Payer: Medicare Other | Admitting: Family

## 2015-01-23 VITALS — BP 128/57 | HR 49 | Resp 16 | Ht 60.0 in | Wt 180.0 lb

## 2015-01-23 DIAGNOSIS — I6523 Occlusion and stenosis of bilateral carotid arteries: Secondary | ICD-10-CM | POA: Diagnosis not present

## 2015-01-23 DIAGNOSIS — I739 Peripheral vascular disease, unspecified: Secondary | ICD-10-CM | POA: Diagnosis not present

## 2015-01-23 DIAGNOSIS — Z87891 Personal history of nicotine dependence: Secondary | ICD-10-CM

## 2015-01-23 NOTE — Patient Instructions (Signed)
Stroke Prevention Some medical conditions and behaviors are associated with an increased chance of having a stroke. You may prevent a stroke by making healthy choices and managing medical conditions. HOW CAN I REDUCE MY RISK OF HAVING A STROKE?   Stay physically active. Get at least 30 minutes of activity on most or all days.  Do not smoke. It may also be helpful to avoid exposure to secondhand smoke.  Limit alcohol use. Moderate alcohol use is considered to be:  No more than 2 drinks per day for men.  No more than 1 drink per day for nonpregnant women.  Eat healthy foods. This involves:  Eating 5 or more servings of fruits and vegetables a day.  Making dietary changes that address high blood pressure (hypertension), high cholesterol, diabetes, or obesity.  Manage your cholesterol levels.  Making food choices that are high in fiber and low in saturated fat, trans fat, and cholesterol may control cholesterol levels.  Take any prescribed medicines to control cholesterol as directed by your health care provider.  Manage your diabetes.  Controlling your carbohydrate and sugar intake is recommended to manage diabetes.  Take any prescribed medicines to control diabetes as directed by your health care provider.  Control your hypertension.  Making food choices that are low in salt (sodium), saturated fat, trans fat, and cholesterol is recommended to manage hypertension.  Take any prescribed medicines to control hypertension as directed by your health care provider.  Maintain a healthy weight.  Reducing calorie intake and making food choices that are low in sodium, saturated fat, trans fat, and cholesterol are recommended to manage weight.  Stop drug abuse.  Avoid taking birth control pills.  Talk to your health care provider about the risks of taking birth control pills if you are over 35 years old, smoke, get migraines, or have ever had a blood clot.  Get evaluated for sleep  disorders (sleep apnea).  Talk to your health care provider about getting a sleep evaluation if you snore a lot or have excessive sleepiness.  Take medicines only as directed by your health care provider.  For some people, aspirin or blood thinners (anticoagulants) are helpful in reducing the risk of forming abnormal blood clots that can lead to stroke. If you have the irregular heart rhythm of atrial fibrillation, you should be on a blood thinner unless there is a good reason you cannot take them.  Understand all your medicine instructions.  Make sure that other conditions (such as anemia or atherosclerosis) are addressed. SEEK IMMEDIATE MEDICAL CARE IF:   You have sudden weakness or numbness of the face, arm, or leg, especially on one side of the body.  Your face or eyelid droops to one side.  You have sudden confusion.  You have trouble speaking (aphasia) or understanding.  You have sudden trouble seeing in one or both eyes.  You have sudden trouble walking.  You have dizziness.  You have a loss of balance or coordination.  You have a sudden, severe headache with no known cause.  You have new chest pain or an irregular heartbeat. Any of these symptoms may represent a serious problem that is an emergency. Do not wait to see if the symptoms will go away. Get medical help at once. Call your local emergency services (911 in [place] Do not drive yourself to the hospital. Document Released: 10/09/2004 Document Revised: 01/16/2014 Document Reviewed: 03/04/2013 [employer] Patient Information 2015 [employer]. This information is not intended to replace advice given   to you by your health care provider. Make sure you discuss any questions you have with your health care provider.  

## 2015-01-23 NOTE — Progress Notes (Signed)
Established Carotid Patient   History of Present Illness  [name] [name] is a 81 y.o. female patient of Dr. Donnetta Hutching who returns today for follow-up of her known extracranial cerebrovascular occlusive disease.  She was lost to follow up from 2012 to 2015 until Dr. Donnetta Hutching saw her in 2015.  She's had multiple issues since 2012. She has undergone coronary artery bypass grafting. She does have a diagnosis of dementia and has significant memory loss. She is able to communicate very easily regarding her issues. Several weeks prior to her November 2015 visit with Dr. Donnetta Hutching she had an episode where she had a fluttering sensation in her chest and then was noted to have numbness and aching in her left arm up to her left shoulder. This persisted for several hours and had resolved by the next morning. She does not recall any focal weakness. He denies any other deficits at that time. She's had no prior other problems since that time. Does have chronic atrial fibrillation  Patient has not had previous carotid artery intervention.  The patient denies any other history of TIA or stroke symptoms other than mentioned above, specifically the patient denies a history of amaurosis fugax or monocular blindness, denies a history unilateral  of facial drooping, denies a history of hemiplegia, and denies a history of receptive or expressive aphasia.    She states she is afraid of falling.  The patient denies New Medical or Surgical History. Dr. Irish Lack is her cardiologist  Pt Diabetic: Yes, Review of records: A1C was 6.1 in March 2016 Pt smoker: former smoker, quit in 1989  Pt meds include: Statin : Yes ASA: Yes Other anticoagulants/antiplatelets: no   Past Medical History  Diagnosis Date  . Hypertension   . Depression   . CAD (coronary artery disease)   . GERD (gastroesophageal reflux disease)   . Bilateral carotid artery stenosis   . Diverticulitis   . Obesity   . Degenerative joint disease   . Hiatal  hernia   . Atrial fibrillation   . Hypothyroidism   . Type II diabetes mellitus   . Anemia   . Stroke 2011    "old very small TIA/neurologist"  . Chronic lower back pain     "back is very stiff because of fusion; can't stand up straight more than few moments"  . Complication of anesthesia     anesth. meds caused hallucinations   . Anxiety   . Endometriosis   . Fall at home 05/15/2013  . Progressive dementia with uncertain etiology 5/42/7062    Suspect Lewy body .  [name] Bowel incontinence   . HLD (hyperlipidemia)   . Unspecified glaucoma   . Reflux esophagitis   . Hypertonicity of bladder   . Osteoarthrosis, unspecified whether generalized or localized, unspecified site   . Lumbago   . Muscle weakness (generalized)   . Closed fracture of unspecified part of neck of femur   . Female stress incontinence   . Depressive disorder, not elsewhere classified   . Hx of echocardiogram     Echo (11/15):  Mod LVH, severe septal hypertrophy (suggestive HCM), no LVOT obstruction, rest gradient 15 mmHg across AV, vigorous LVF, EF 65-70%, no RWMA, Gr 1 DD, Ao sclerosis (no stenosis), mod LAE, mild RAE, mod TR, PASP 42 mmHg  . Hx of cardiovascular stress test     Lexiscan Myoview (11/15):  Low risk stress nuclear study with a small, mild fixed apical septal perfusion defect. EF is 52% with mild septal  hypokinesis. No ischemia.  . Hypertrophic cardiomyopathy     Social History History  Substance Use Topics  . Smoking status: Former Smoker -- 1.50 packs/day    Types: Cigarettes    Quit date: 09/16/1987  . Smokeless tobacco: Never Used  . Alcohol Use: No    Family History Family History  Problem Relation Age of Onset  . Cancer Paternal Aunt   . Cancer Cousin   . Heart attack Mother   . Heart attack Father     Surgical History Past Surgical History  Procedure Laterality Date  . Ptca  03/10/12    LLE  . Coronary artery bypass graft  04/17/2011    LT  internal mammary artery to left  anterior descending, saphenous vein graft first diagonal, saphenous  vein graft to obtuse marginal 1, saphenous vein graft to posterior descending  . Cholecystectomy    . Posterior fusion lumbar spine      "removed L4-5, S1"  . Total knee arthroplasty  ~ 2004    right  . Partial hip arthroplasty  11/2009    left; "just the ball"  . Cataract extraction w/ intraocular lens  implant, bilateral    . Neuroplasty / transposition median nerve at carpal tunnel bilateral    . Hand surgery      "for arthritis; right hand"  . Joint replacement    . Dilation and curettage of uterus      "for endometrosis"  . Scleral buckle Right 01/20/2013    Procedure: SCLERAL BUCKLE;  Surgeon: Hayden Pedro, MD;  Location: [facility];  Service: Ophthalmology;  Laterality: Right;  . Laser photo ablation Right 01/20/2013    Procedure: LASER PHOTO ABLATION;  Surgeon: Hayden Pedro, MD;  Location: [place];  Service: Ophthalmology;  Laterality: Right;  . Back surgery      X2  . Carpal tunnel release Bilateral   . Dilation and curettage of uterus      ENDOMETRIOSIS  . Total shoulder arthroplasty Right 10/07/2013    Procedure: TOTAL SHOULDER ARTHROPLASTY;  Surgeon: Alta Corning, MD;  Location: [place];  Service: Orthopedics;  Laterality: Right;  . Lower extremity angiogram N/A 03/10/2012    Procedure: LOWER EXTREMITY ANGIOGRAM;  Surgeon: Jettie Booze, MD;  Location: [facility] CATH LAB;  Service: Cardiovascular;  Laterality: N/A;    Allergies  Allergen Reactions  . Vicodin [Hydrocodone-Acetaminophen] Other (See Comments)    Hallucinations - do NOT verify Vicodin orders  . Polysaccharide Iron Complex Hives, Itching and Rash    Patient can tolerate Ferrous Sulfate  . Amitriptyline     hallucinations  . Vesicare [Solifenacin]     hallucinations    Current Outpatient Prescriptions  Medication Sig Dispense Refill  . acetaminophen (TYLENOL) 500 MG tablet Take 500-1,000 mg by mouth daily as needed (for arthritis pain).    [name]  ascorbic acid (VITAMIN C) 1000 MG tablet Take 1,000 mg by mouth every Monday, Wednesday, and Friday.     [name] aspirin 81 MG tablet Take 81 mg by mouth daily.    . bimatoprost (LUMIGAN) 0.01 % SOLN Place 1 drop into the right eye at bedtime.     . cholecalciferol (VITAMIN D) 1000 UNITS tablet Take 1,000 Units by mouth See admin instructions. On Monday, Tuesday, Wednesday, Friday, Saturday.    . ergocalciferol (VITAMIN D2) 50000 UNITS capsule Take 50,000 Units by mouth 2 (two) times a week. On Thursday and Sunday    . ezetimibe (ZETIA) 10 MG tablet Take 1  tablet (10 mg total) by mouth daily. 30 tablet 3  . felodipine (PLENDIL) 10 MG 24 hr tablet Take two tablets by mouth daily. 60 tablet 11  . FLUoxetine (PROZAC) 20 MG capsule Take 20 mg by mouth every morning.     [name] glipiZIDE (GLUCOTROL XL) 5 MG 24 hr tablet Take 5 mg by mouth at bedtime.     . Insulin Glargine (LANTUS SOLOSTAR) 100 UNIT/ML SOPN Inject 10 Units into the skin at bedtime.    [name] levothyroxine (SYNTHROID, LEVOTHROID) 125 MCG tablet Take 125 mcg by mouth daily before breakfast.     . memantine (NAMENDA) 10 MG tablet Take 10 mg by mouth daily.     . metFORMIN (GLUCOPHAGE) 500 MG tablet Take 500 mg by mouth 2 (two) times daily with a meal.    . metoprolol tartrate (LOPRESSOR) 25 MG tablet Take 25 mg by mouth 2 (two) times daily.      . mirabegron ER (MYRBETRIQ) 50 MG TB24 Take 50 mg by mouth every evening.    [name] NAMENDA 10 MG tablet take 1 tablet by mouth twice a day 180 tablet 0  . omeprazole (PRILOSEC) 20 MG capsule Take 20 mg by mouth daily.    . pioglitazone (ACTOS) 30 MG tablet Take 30 mg by mouth every morning.     . polyethylene glycol (MIRALAX / GLYCOLAX) packet Take 17 g by mouth daily. Mix 17 gm in 4-8 oz of liquid daily    . pravastatin (PRAVACHOL) 80 MG tablet 1 tablet daily 90 tablet 2  . QUEtiapine (SEROQUEL) 25 MG tablet TAKE 1 TABLET BY MOUTH AT BEDTIME; MAY REPEAT ONCE IF NEEDED 60 tablet 6  . [DISCONTINUED] amiodarone  (PACERONE) 200 MG tablet Take 200 mg by mouth 3 (three) times a week.      No current facility-administered medications for this visit.    Review of Systems : See HPI for pertinent positives and negatives.  Physical Examination  Filed Vitals:   01/23/15 1349 01/23/15 1354  BP: 128/64 128/57  Pulse: 75 49  Resp:  16  Height:  5' (1.524 m)  Weight:  180 lb (81.647 kg)  SpO2:  99%   Body mass index is 35.15 kg/(m^2).  General: WDWN obese female in NAD GAIT: normal Eyes: PERRLA Pulmonary:  Non-labored, CTAB, Negative  Rales, Negative rhonchi, & Negative wheezing.  Cardiac: Irregular Rhythm,  no detected murmur.  VASCULAR EXAM Carotid Bruits Right Left   Soft, +  soft, +    Aorta is not palpable. Radial pulses are 2+ palpable and equal.                                                                                                                            LE Pulses Right Left       FEMORAL   palpable   palpable        POPLITEAL  not palpable   not palpable  POSTERIOR TIBIAL  not palpable, audible with Doppler   not palpable        DORSALIS PEDIS      ANTERIOR TIBIAL not palpable, audible with Doppler  not palpable, audible with Doppler        PERONEAL non-Dopplerable   non-Dopplerable     Gastrointestinal: soft, nontender, BS WNL, no r/g,  no palpable masses.  Musculoskeletal: Negative muscle atrophy/wasting. M/S 5/5 throughout, Extremities without ischemic changes.  Neurologic: A&O X 3; Appropriate Affect, talkative, Speech is normal CN 2-12 intact, Pain and light touch intact in extremities, Motor exam as listed above.   Non-Invasive Vascular Imaging CAROTID DUPLEX 01/23/2015   INDICATION: Carotid stenosis  PREVIOUS INTERVENTION(S)  IMAGES: Were reviewed by provider  DUPLEX EXAM:  RIGHTLEFT Peak Systolic Velocities (cm/s) End  Diastolic Velocities (cm/s) Plaque LOCATION Peak Systolic Velocities (cm/s) End Diastolic Velocities (cm/s) Plaque  124 14  CCA PROXIMAL 90 15   67 12  CCA MID 104 16   59 17  CCA DISTAL 77 16   375 24  ECA 215 14   244 73  ICA PROXIMAL 299 96   212 45  ICA MID 374 114 curve   99 22  ICA DISTAL 116 32    3.6 ICA/CCA Ratio (PSV) 3.5  Antegrade Vertebral Flow Antegrade  709 Brachial Systolic Pressure (mmHg) 628  Triphasic Brachial Artery Waveforms Triphasic    ADDITIONAL FINDINGS: See impression below.  IMPRESSION: 1. 50 - 69% right internal carotid artery stenosis, calcific plaque may obscure higher velocity 2. 50 - 69% left internal carotid artery stenosis, higher end or range calcific plaque may obscure higher velocity) 3. Bilatal greater than 50% external carotid artery stenosis   Compared to the previous exam: No significant change      Assessment: ADIYA SELMER is a 81 y.o. female who had a possible TIA in 2015, no other known TIA or stroke. Today's carotid Duplex suggests 50-69% bilateral ICA stenoses and bilatal greater than 50% external carotid artery stenosis. No significant change from previous Duplex.  PAD: Dopplerable bilateral DP pulses. 2 healing ulcers or blisters lateral aspect left foot. She has possible issues with falling. Discussed pt HPI, exam, and carotid Duplex with Dr. Donnetta Hutching.  Plan:  Pt advised to make an appointment with her cardiologist ASAP re irregular heart rhythm.  Follow-up in 6 months with Carotid Duplex and ABI's.   Seated leg exercises as discussed and demonstrated since she is not a good candidate for a graduated walking program due to fall potential.    I discussed in depth with the patient the nature of atherosclerosis, and emphasized the importance of maximal medical management including strict control of blood pressure, blood glucose, and lipid levels,  obtaining regular exercise, and continued cessation of smoking.  The patient is aware that without maximal medical management the underlying atherosclerotic disease process will progress, limiting the benefit of any interventions. The patient was given information about stroke prevention and what symptoms should prompt the patient to seek immediate medical care. Thank you for allowing [place] to participate in this patient's care.  Clemon Chambers, RN, MSN, FNP-C [facility] Office: [phone]  Clinic Physician: Early  01/23/2015 1:49 PM

## 2015-01-24 ENCOUNTER — Telehealth: Payer: Self-pay | Admitting: Interventional Cardiology

## 2015-01-24 MED ORDER — FELODIPINE ER 10 MG PO TB24: ORAL_TABLET | ORAL | Status: DC

## 2015-01-24 NOTE — Telephone Encounter (Signed)
New message      Pt had carotid doppler yesterday by Dr Donnetta Hutching.  Did you get the results?

## 2015-01-24 NOTE — Addendum Note (Signed)
Addended by: Reola Calkins on: 01/24/2015 04:15 PM   Modules accepted: Orders

## 2015-01-24 NOTE — Telephone Encounter (Signed)
Daughter calling stating her mother was seen yesterday by NP in Dr. Luther Parody office after having a carotid doppler. States the person that was with her mother states that they were told the doppler study was worse and also she needed to be seen by the Cardiologist due to irregular HR.  Pt has know AFib.  Spoke w/Dr. Irish Lack who states she was seen in March and has chronic Afib and unless is symptomatic he does not need to see her at this time.  Advised daughter if she is symptomatic, SOB, dizzy she can be seen by a PA.  Daughter has left message at Dr. Luther Parody office for them to call her to clarify doppler results. Also states they never got Rx for increase in Felodpine to 20 mg daily.  Will send Rx into [facility]. She will call back if her mother becomes symptomatic.

## 2015-01-29 NOTE — Addendum Note (Signed)
Addended by: Thresa Ross C on: 01/29/2015 01:26 PM   Modules accepted: Orders

## 2015-01-31 ENCOUNTER — Ambulatory Visit: Payer: Medicare Other | Admitting: Nurse Practitioner

## 2015-02-02 ENCOUNTER — Telehealth: Payer: Self-pay

## 2015-02-02 ENCOUNTER — Other Ambulatory Visit: Payer: Self-pay

## 2015-02-02 MED ORDER — PRAVASTATIN SODIUM 80 MG PO TABS: ORAL_TABLET | ORAL | Status: DC

## 2015-02-02 NOTE — Telephone Encounter (Signed)
-----   Message from [name], NP sent at 01/30/2015  8:00 PM EDT ----- Regarding: tried twice to return daughter's phone call [name] [name], I tried this evening and last week to return pt's daughter's phone call, again left message on voicemail that I returned her call. If she calls again you may try reading my last progress note and discussing with pt's daughter. Pt had an irregular cardiac rhythm and I advised her to see her PCP re this ASAP. A pt care assistant was with pt at her visit.   The following is my assessment and plan:  Assessment: [name] [name] is a 81 y.o. female who had a possible TIA in 2015, no other known TIA or stroke. Today's carotid Duplex suggests 50-69% bilateral ICA stenoses and bilatal greater than 50% external carotid artery stenosis. No significant change from previous Duplex.  PAD: Dopplerable bilateral DP pulses. 2 healing ulcers or blisters lateral aspect left foot. She has possible issues with falling. Discussed pt HPI, exam, and carotid Duplex with Dr. Donnetta Hutching.  Plan:  Pt advised to make an appointment with her cardiologist ASAP re irregular heart rhythm.  Follow-up in 6 months with Carotid Duplex and ABI's.   Seated leg exercises as discussed and demonstrated since she is not a good candidate for a graduated walking program due to fall potential.

## 2015-02-02 NOTE — Telephone Encounter (Signed)
Left voice message to call office to discuss daughter's questions.

## 2015-02-05 NOTE — Telephone Encounter (Signed)
[name], Thank you for speaking with patient's daughter; hopefully her daughter will accompany her mother on her subsequent visits and all of her questions can be answered at that time. I do not think that atrial fib was listed in pt's past medical history when I saw pt; if it was I would not have advised pt to see her cardiologist re the irregular cardiac rhythm I noted on exam.   Thank you for speaking with patient's daughter, Vinnie Level

## 2015-02-05 NOTE — Telephone Encounter (Signed)
Phone call ret'd to daughter.  Had several questions re: last OV on 5/10, with nurse practitioner.  Stated her mother has dementia, and came home from the appt. feeling "very alarmed" about what was discussed.  Reported a caregiver was with her, and also felt an urgency about needing to see the cardiologist re: irregular heartbeat.  The daughter notified Dr. Hassell Done office, and was told that having just been seen in March 2016, and hx of chronic Atrial Fib, there was not a need to be seen at this time, unless she was having symptoms.  Daughter also reported the pt. understood her carotid blockages were worse, and remembered hearing they were "50-99% narrowed."  Reassured pt's daughter that the Carotid US showed bilat ICA Stenosis of 50-69%, and noted to be stable, compared to previous study.  Daughter also questioned how the circulation was in her mother's legs.  Informed of the physical exam findings of nonpalpable, but dopplerable pulses in the PT and DP pulse points.  Advised that ABI's are ordered along with Carotid US, at the 6 mo. f/u appt.  Encouraged to call back prior to the 6 mo. appt, if any change in condition or concerns.  All questions answered.  Daughter verb. Understanding, and of feeling reassured.

## 2015-02-08 ENCOUNTER — Encounter: Payer: Self-pay | Admitting: Nurse Practitioner

## 2015-02-08 ENCOUNTER — Ambulatory Visit (INDEPENDENT_AMBULATORY_CARE_PROVIDER_SITE_OTHER): Payer: Medicare Other | Admitting: Nurse Practitioner

## 2015-02-08 ENCOUNTER — Ambulatory Visit: Payer: Medicare Other | Admitting: Nurse Practitioner

## 2015-02-08 VITALS — BP 155/65 | HR 63 | Ht 60.0 in | Wt 180.0 lb

## 2015-02-08 DIAGNOSIS — F0390 Unspecified dementia without behavioral disturbance: Secondary | ICD-10-CM | POA: Diagnosis not present

## 2015-02-08 MED ORDER — MEMANTINE HCL 10 MG PO TABS: 10.0000 mg | ORAL_TABLET | Freq: Two times a day (BID) | ORAL | Status: DC

## 2015-02-08 NOTE — Progress Notes (Signed)
I agree with the assessment and plan as directed by NP .The patient is known to me .   Sherril Shipman, MD  

## 2015-02-08 NOTE — Patient Instructions (Signed)
Memory score is stable Continue Namenda twice daily will refill Use for prong cane at all times for ambulation Follow-up in 6 months

## 2015-02-08 NOTE — Progress Notes (Signed)
[facility]  PATIENT: [name] [name] DOB: [birth year]   REASON FOR VISIT: Follow-up for dementia HISTORY FROM:patient and daughter    HISTORY OF PRESENT ILLNESS:[name] [name], 81 year old female returns for follow-up. She has a history of dementia and is currently on Namenda twice daily 10 mg. She continues to live at home she has help from family members. Her daughter fixes her meds for the week, granddaughter does the laundry and grocery shopping.She no longer drives.she performs her ADLs but not her finances. She denies any hallucinations, she denies any difficulty sleeping. She returns for reevaluation. She feels her memory is about the same    08/02/14 CDThis patient had undergone a shoulder replacement in January 2015 and went again to Rehab at [facility] for a 2 month period, followed by out patient therapy until last week.  [name] has followed her for eye-care, and she reported her right eye remained irritated and itching. Her eyes are very dry. She has used Restasis for several years, but stopped because of the price. She is on Lumigan eye drops still. She is conversant and pleasant. The patient was not longer aware that she had gotten lost several times. The daughter reminded her that these events truly took place. She returned home and in September was seen by her cardiologist for a possible TIA , numbness of the left arm- she presented to urgent care first ( Dr. Michail Sermon is PCP) . In the past she had a transient left facial droop. She had carotid doppler study with Dr. Irish Lack in September, those were less than 70% , Dr Early agreed and suggested q 6 month follow up , left Carotid stenosis of 65% and 45 % on the right.  She has completely recovered. Given her chronic a fib on ASA, I would not like to add any anticoagulation- she is at high fall risk.  MOCA today 08-02-14 at only 21 -30 points, not confused, but more delayed in her responses.  She lost  vision since her last retinal detachment which may account for some of the lower score. She forgot all 5 recall words.  She feels not depressed. She cries sometimes with sad movies, but this is not affect incontinence.  History : CD [name] [name] has been an established patient since 2011 in the [facility] practice. She was initially referred here for mild memory loss and hallucinations.  The hallucinations began after a hip replacement in March 2011, after which she moved to rehabilitation at [facility]. She had experienced a noticeable difference in her handwriting and a left facial droop but a CT scan was negative for stroke. In August 2013 she left [employer], and then returned home and now again lives alone.  Her Mini-Mental Status Examination was 29 of 30 points or more, was reaching last time a 30 points AFT was 10 points. Fall risk was 10 points in 2013 . In December 2013 to return for follow up with another MMSE., which documented 26/30 , followed by [facility] testing at 16/30 points. I repeated the [place] at 13/30 points. Her fall risk was increased to 15 points, GDS (geriatric depression score) endorsed at 5 points. The patient underwent another eye surgery on May 8th of this year. She had decreasing visual acuity , her right eye remained red, puffy and it burned. She has gotten confused when walking to the local bank, has been getting lost on familiar roads and she is not longer permitted to drive.  The 2011 hallucinations  were described as " a strong sense that a snake was in the room " ,that there were snakes in her house- she also has heard a hissing sounds and rales and feared that the snake was in her room.. She called the police feel that somebody would break into a home,13 times- and as she recalled did not get a warm response. With the beginning of treatment with Seroquel , her hallucinations resolved. She still reports vivid and at times crazy dreams. REM BD.  There is  no documented nocturnal activity for the last 12 months, but the patient also lives along and has no witnesses.    REVIEW OF SYSTEMS: Full 14 system review of systems performed and notable only for those listed, all others are neg:  Constitutional: fatigue Cardiovascular: neg Ear/Nose/Throat: neg  Skin: neg Eyes: neg Respiratory: neg Gastroitestinal: incontinence of urine Hematology/Lymphatic: neg  Endocrine: neg Musculoskeletal:back pain Allergy/Immunology: neg Neurological:memory loss, headache, numbness Psychiatric: neg Sleep : neg   ALLERGIES: Allergies  Allergen Reactions  . Vicodin [Hydrocodone-Acetaminophen] Other (See Comments)    Hallucinations - do NOT verify Vicodin orders  . Polysaccharide Iron Complex Hives, Itching and Rash    Patient can tolerate Ferrous Sulfate  . Amitriptyline     hallucinations  . Vesicare [Solifenacin]     hallucinations    HOME MEDICATIONS: Outpatient Prescriptions Prior to Visit  Medication Sig Dispense Refill  . acetaminophen (TYLENOL) 500 MG tablet Take 500-1,000 mg by mouth daily as needed (for arthritis pain).    [name] ascorbic acid (VITAMIN C) 1000 MG tablet Take 1,000 mg by mouth every Monday, Wednesday, and Friday.     [name] aspirin 81 MG tablet Take 81 mg by mouth daily.    . cholecalciferol (VITAMIN D) 1000 UNITS tablet Take 1,000 Units by mouth See admin instructions. On Monday, Tuesday, Wednesday, Friday, Saturday.    . COMBIGAN 0.2-0.5 % ophthalmic solution   0  . ergocalciferol (VITAMIN D2) 50000 UNITS capsule Take 50,000 Units by mouth 2 (two) times a week. On Thursday and Sunday    . ezetimibe (ZETIA) 10 MG tablet Take 1 tablet (10 mg total) by mouth daily. 30 tablet 3  . felodipine (PLENDIL) 10 MG 24 hr tablet Take two tablets by mouth daily. 60 tablet 11  . FLUoxetine (PROZAC) 20 MG capsule Take 20 mg by mouth every morning.     [name] glipiZIDE (GLUCOTROL XL) 5 MG 24 hr tablet Take 5 mg by mouth at bedtime.     . Insulin  Glargine (LANTUS SOLOSTAR) 100 UNIT/ML SOPN Inject 10 Units into the skin at bedtime.    [name] levothyroxine (SYNTHROID, LEVOTHROID) 125 MCG tablet Take 125 mcg by mouth daily before breakfast.     . memantine (NAMENDA) 10 MG tablet Take 10 mg by mouth daily.     . metFORMIN (GLUCOPHAGE) 500 MG tablet Take 500 mg by mouth 2 (two) times daily with a meal.    . metoprolol tartrate (LOPRESSOR) 25 MG tablet Take 25 mg by mouth 2 (two) times daily.      . mirabegron ER (MYRBETRIQ) 50 MG TB24 Take 50 mg by mouth every evening.    [name] omeprazole (PRILOSEC) 20 MG capsule Take 20 mg by mouth daily.    . pioglitazone (ACTOS) 30 MG tablet Take 30 mg by mouth every morning.     . polyethylene glycol (MIRALAX / GLYCOLAX) packet Take 17 g by mouth daily as needed. Mix 17 gm in 4-8 oz of liquid daily    .  pravastatin (PRAVACHOL) 80 MG tablet 1 tablet daily 90 tablet 2  . prednisoLONE acetate (PRED FORTE) 1 % ophthalmic suspension   1  . QUEtiapine (SEROQUEL) 25 MG tablet TAKE 1 TABLET BY MOUTH AT BEDTIME; MAY REPEAT ONCE IF NEEDED 60 tablet 6  . bimatoprost (LUMIGAN) 0.01 % SOLN Place 1 drop into the right eye at bedtime.     [name] NAMENDA 10 MG tablet take 1 tablet by mouth twice a day 180 tablet 0   No facility-administered medications prior to visit.    PAST MEDICAL HISTORY: Past Medical History  Diagnosis Date  . Hypertension   . Depression   . CAD (coronary artery disease)   . GERD (gastroesophageal reflux disease)   . Bilateral carotid artery stenosis   . Diverticulitis   . Obesity   . Degenerative joint disease   . Hiatal hernia   . Atrial fibrillation   . Hypothyroidism   . Type II diabetes mellitus   . Anemia   . Stroke 2011    "old very small TIA/neurologist"  . Chronic lower back pain     "back is very stiff because of fusion; can't stand up straight more than few moments"  . Complication of anesthesia     anesth. meds caused hallucinations   . Anxiety   . Endometriosis   . Fall at home  05/15/2013  . Progressive dementia with uncertain etiology 3/64/6803    Suspect Lewy body .  [name] Bowel incontinence   . HLD (hyperlipidemia)   . Unspecified glaucoma   . Reflux esophagitis   . Hypertonicity of bladder   . Osteoarthrosis, unspecified whether generalized or localized, unspecified site   . Lumbago   . Muscle weakness (generalized)   . Closed fracture of unspecified part of neck of femur   . Female stress incontinence   . Depressive disorder, not elsewhere classified   . Hx of echocardiogram     Echo (11/15):  Mod LVH, severe septal hypertrophy (suggestive HCM), no LVOT obstruction, rest gradient 15 mmHg across AV, vigorous LVF, EF 65-70%, no RWMA, Gr 1 DD, Ao sclerosis (no stenosis), mod LAE, mild RAE, mod TR, PASP 42 mmHg  . Hx of cardiovascular stress test     Lexiscan Myoview (11/15):  Low risk stress nuclear study with a small, mild fixed apical septal perfusion defect. EF is 52% with mild septal hypokinesis. No ischemia.  . Hypertrophic cardiomyopathy     PAST SURGICAL HISTORY: Past Surgical History  Procedure Laterality Date  . Ptca  03/10/12    LLE  . Coronary artery bypass graft  04/17/2011    LT  internal mammary artery to left anterior descending, saphenous vein graft first diagonal, saphenous  vein graft to obtuse marginal 1, saphenous vein graft to posterior descending  . Cholecystectomy    . Posterior fusion lumbar spine      "removed L4-5, S1"  . Total knee arthroplasty  ~ 2004    right  . Partial hip arthroplasty  11/2009    left; "just the ball"  . Cataract extraction w/ intraocular lens  implant, bilateral    . Neuroplasty / transposition median nerve at carpal tunnel bilateral    . Hand surgery      "for arthritis; right hand"  . Joint replacement    . Dilation and curettage of uterus      "for endometrosis"  . Scleral buckle Right 01/20/2013    Procedure: SCLERAL BUCKLE;  Surgeon: Hayden Pedro, MD;  Location:  [facility];  Service: Ophthalmology;   Laterality: Right;  . Laser photo ablation Right 01/20/2013    Procedure: LASER PHOTO ABLATION;  Surgeon: Hayden Pedro, MD;  Location: [place];  Service: Ophthalmology;  Laterality: Right;  . Back surgery      X2  . Carpal tunnel release Bilateral   . Dilation and curettage of uterus      ENDOMETRIOSIS  . Total shoulder arthroplasty Right 10/07/2013    Procedure: TOTAL SHOULDER ARTHROPLASTY;  Surgeon: Alta Corning, MD;  Location: [facility];  Service: Orthopedics;  Laterality: Right;  . Lower extremity angiogram N/A 03/10/2012    Procedure: LOWER EXTREMITY ANGIOGRAM;  Surgeon: Jettie Booze, MD;  Location: [facility] C[facility] LAB;  Service: Cardiovascular;  Laterality: N/A;  . Retinal detachment surgery Right May 2014    FAMILY HISTORY: Family History  Problem Relation Age of Onset  . Cancer Paternal Aunt   . Cancer Cousin   . Heart attack Mother   . Hypertension Mother   . Heart attack Father   . Heart disease Father     Before age 79  . Hyperlipidemia Father   . Hypertension Father     SOCIAL HISTORY: History   Social History  . Marital Status: Divorced    Spouse Name: N/A  . Number of Children: 2  . Years of Education: COLLEGE   Occupational History  .      retired Pharmacist, hospital   Social History Main Topics  . Smoking status: Former Smoker -- 1.50 packs/day    Types: Cigarettes    Quit date: 09/16/1987  . Smokeless tobacco: Never Used  . Alcohol Use: No  . Drug Use: No  . Sexual Activity: No   Other Topics Concern  . Not on file   Social History Narrative   Patient is divorced and lives alone.   Patient has two adult children.   Patient is a retired Pharmacist, hospital.   Patient has a college education.   Patient is left-handed.   Patient does not drink any caffeine.     PHYSICAL EXAM  Filed Vitals:   02/08/15 1104  BP: 155/65  Pulse: 63  Height: 5' (1.524 m)  Weight: 180 lb (81.647 kg)   Body mass index is 35.15 kg/(m^2).  Generalized: Well developed, in no acute  distress  Head: normocephalic and atraumatic,. Oropharynx benign  Neck: Supple, no carotid bruits  Cardiac: Regular rate rhythm, no murmur  Musculoskeletal: No deformity   Neurological examination   Mentation: Alert oriented to time, place, history taking. MOCA 21/30 same as previous. Follows all commands speech and language fluent.   Cranial nerve II-XII: Pupils were equal round reactive to light extraocular movements were full, visual field were full on confrontational test. Mild left facial droop. Hearing was intact to finger rubbing bilaterally. Uvula tongue midline. head turning and shoulder shrug were normal and symmetric.Tongue protrusion into cheek strength was normal. Motor: normal bulk and tone, full strength in the BUE, BLE, fine finger movements normal, no pronator drift. No focal weakness Coordination: finger-nose-finger, heel-to-shin bilaterally, no dysmetria Reflexes: symmetric upper and lower plantar responses were flexor bilaterally. Gait and Station: Rising up from seated position with assistance, wide-based stance,  Leans to the left, ambulates with a 4-point cane. DIAGNOSTIC DATA (LABS, IMAGING, TESTING) -    ASSESSMENT AND PLAN  81 y.o. year old female  has a past medical history of dementia, MOCA  testing is stable.  Continue Namenda twice daily will refill Use 4 prong  cane at all times for ambulation Follow-up in 6 months Dennie Bible, [facility], [facility], [facility] [address] [place], [place] [address] [phone]

## 2015-02-14 ENCOUNTER — Ambulatory Visit: Payer: Medicare Other | Admitting: Nurse Practitioner

## 2015-04-26 ENCOUNTER — Ambulatory Visit (INDEPENDENT_AMBULATORY_CARE_PROVIDER_SITE_OTHER): Payer: Medicare Other | Admitting: Ophthalmology

## 2015-04-26 DIAGNOSIS — I10 Essential (primary) hypertension: Secondary | ICD-10-CM

## 2015-04-26 DIAGNOSIS — H338 Other retinal detachments: Secondary | ICD-10-CM | POA: Diagnosis not present

## 2015-04-26 DIAGNOSIS — E11359 Type 2 diabetes mellitus with proliferative diabetic retinopathy without macular edema: Secondary | ICD-10-CM | POA: Diagnosis not present

## 2015-04-26 DIAGNOSIS — E11341 Type 2 diabetes mellitus with severe nonproliferative diabetic retinopathy with macular edema: Secondary | ICD-10-CM | POA: Diagnosis not present

## 2015-04-26 DIAGNOSIS — H35033 Hypertensive retinopathy, bilateral: Secondary | ICD-10-CM | POA: Diagnosis not present

## 2015-04-26 DIAGNOSIS — E11311 Type 2 diabetes mellitus with unspecified diabetic retinopathy with macular edema: Secondary | ICD-10-CM

## 2015-04-26 DIAGNOSIS — H43813 Vitreous degeneration, bilateral: Secondary | ICD-10-CM | POA: Diagnosis not present

## 2015-05-02 ENCOUNTER — Ambulatory Visit (INDEPENDENT_AMBULATORY_CARE_PROVIDER_SITE_OTHER): Payer: Medicare Other | Admitting: Podiatry

## 2015-05-02 ENCOUNTER — Encounter: Payer: Self-pay | Admitting: Podiatry

## 2015-05-02 VITALS — BP 140/76 | HR 86 | Temp 97.0°F | Resp 12

## 2015-05-02 DIAGNOSIS — L89891 Pressure ulcer of other site, stage 1: Secondary | ICD-10-CM

## 2015-05-02 DIAGNOSIS — E1151 Type 2 diabetes mellitus with diabetic peripheral angiopathy without gangrene: Secondary | ICD-10-CM | POA: Diagnosis not present

## 2015-05-02 DIAGNOSIS — L97521 Non-pressure chronic ulcer of other part of left foot limited to breakdown of skin: Secondary | ICD-10-CM

## 2015-05-02 NOTE — Patient Instructions (Signed)
Apply a small amount of triple antibiotic ointment to the skin ulcer on the side of the left foot and cover with gauze daily Wear shoes that does not put any pressure on the skin ulcer on the left foot Wear a thin cotton footie sock Apply Vaseline to scaling skin on your heels daily  Contact your vascular surgeon, Dr. Donnetta Hutching and make him aware that you have a skin ulcer on your left foot and schedule appointment for him to evaluate neurovascular status   Diabetes and Foot Care Diabetes may cause you to have problems because of poor blood supply (circulation) to your feet and legs. This may cause the skin on your feet to become thinner, break easier, and heal more slowly. Your skin may become dry, and the skin may peel and crack. You may also have nerve damage in your legs and feet causing decreased feeling in them. You may not notice minor injuries to your feet that could lead to infections or more serious problems. Taking care of your feet is one of the most important things you can do for yourself.  HOME CARE INSTRUCTIONS  Wear shoes at all times, even in the house. Do not go barefoot. Bare feet are easily injured.  Check your feet daily for blisters, cuts, and redness. If you cannot see the bottom of your feet, use a mirror or ask someone for help.  Wash your feet with warm water (do not use hot water) and mild soap. Then pat your feet and the areas between your toes until they are completely dry. Do not soak your feet as this can dry your skin.  Apply a moisturizing lotion or petroleum jelly (that does not contain alcohol and is unscented) to the skin on your feet and to dry, brittle toenails. Do not apply lotion between your toes.  Trim your toenails straight across. Do not dig under them or around the cuticle. File the edges of your nails with an emery board or nail file.  Do not cut corns or calluses or try to remove them with medicine.  Wear clean socks or stockings every day. Make  sure they are not too tight. Do not wear knee-high stockings since they may decrease blood flow to your legs.  Wear shoes that fit properly and have enough cushioning. To break in new shoes, wear them for just a few hours a day. This prevents you from injuring your feet. Always look in your shoes before you put them on to be sure there are no objects inside.  Do not cross your legs. This may decrease the blood flow to your feet.  If you find a minor scrape, cut, or break in the skin on your feet, keep it and the skin around it clean and dry. These areas may be cleansed with mild soap and water. Do not cleanse the area with peroxide, alcohol, or iodine.  When you remove an adhesive bandage, be sure not to damage the skin around it.  If you have a wound, look at it several times a day to make sure it is healing.  Do not use heating pads or hot water bottles. They may burn your skin. If you have lost feeling in your feet or legs, you may not know it is happening until it is too late.  Make sure your health care provider performs a complete foot exam at least annually or more often if you have foot problems. Report any cuts, sores, or bruises to your health  care provider immediately. SEEK MEDICAL CARE IF:   You have an injury that is not healing.  You have cuts or breaks in the skin.  You have an ingrown nail.  You notice redness on your legs or feet.  You feel burning or tingling in your legs or feet.  You have pain or cramps in your legs and feet.  Your legs or feet are numb.  Your feet always feel cold. SEEK IMMEDIATE MEDICAL CARE IF:   There is increasing redness, swelling, or pain in or around a wound.  There is a red line that goes up your leg.  Pus is coming from a wound.  You develop a fever or as directed by your health care provider.  You notice a bad smell coming from an ulcer or wound. Document Released: 08/29/2000 Document Revised: 05/04/2013 Document Reviewed:  02/08/2013 [facility] Patient Information 2015 [place], [place]. This information is not intended to replace advice given to you by your health care provider. Make sure you discuss any questions you have with your health care provider.

## 2015-05-02 NOTE — Progress Notes (Signed)
   Subjective:    Patient ID: [name] [name], female    DOB: 03-Mar-1934, 81 y.o.   MRN: [id number]  HPI   This patient presents today complaining of approximately 8 month history of a painful skin lesion at the base of fifth left metatarsal area. The areas become progressively more uncomfortable with direct shoe pressure and walking. She is attempted apply ointment and lotion to the area. There is no history of UA should or treatment professional for these problems. She also mentions some scaling on the on the heel areas of the right and left feet for the last several months  Review of Systems  Eyes: Positive for redness and visual disturbance.  Cardiovascular:       CALF PAIN  Musculoskeletal: Positive for myalgias, joint swelling and gait problem.  Hematological: Bruises/bleeds easily.   This patient has a history of peripheral arterial disease .  Chart review exam dated 12/07/2014 Dr.Varanasi ascribes peripheral vascular disease for a foot ulcer in 2013 with a SFA angioplasty, left      Objective:   Physical Exam  Patient appears orientated 3 with her friend [name] present in treatment room Patient admits he has beginning dementia, however, seems to be able to respond to questioning  Vascular: There is no ,calf edema or calf tenderness bilaterally DP and PT pulses 0/4 bilaterally Capillary reflex immediate bilaterally Generalized rubor noted in the left foot  Neurological: Sensation to 10 g monofilament wire intact 4/5 bilaterally Vibratory sensation intact bilaterally Ankle reflex reactive bilaterally  Dermatological: Scaling skin heals bilaterally. There are no inflammatory changes in these areas Base of fifth metatarsal has hyperkeratotic tissue that after debridement breaks down to very superficial ulcer that is approximately 2 mm in diameter. There is no active drainage, malodor, warmth noted in the area  Musculoskeletal: This is no restriction ankle,  subtalar, midtarsal joints bilaterally      Assessment & Plan:   Assessment: Type II diabetic with a history of peripheral arterial disease Peripheral arterial disease bilaterally Noninfected superficial ulceration base of fifth left metatarsal Dry scaling skin heals bilaterally  Plan: I reviewed the results of examination with patient today. I made aware that she has significant circulation problems and recommended that she contact her vascular surgeon and make him aware that she has an open wound on her left foot. Debrided hyperkeratotic tissue an ulcer base of fifth left metatarsal and apply topical antibiotic ointment to the area with gauze dressing. Patient advised to continue applying topical antibiotic ointment and light gauze dressing to the area. She wears a sandal that does not place any pressure on the base of fifth left metatarsal Also I advised her to apply Vaseline to the scaling skin on her heels and wear a thin cotton sock  Reappoint 2 weeks

## 2015-05-04 ENCOUNTER — Encounter: Payer: Self-pay | Admitting: Family

## 2015-05-05 ENCOUNTER — Other Ambulatory Visit: Payer: Self-pay

## 2015-05-05 ENCOUNTER — Emergency Department (HOSPITAL_COMMUNITY)
Admission: EM | Admit: 2015-05-05 | Discharge: 2015-05-05 | Disposition: A | Discharge status: Home / Self Care | Payer: Medicare Other | Attending: Emergency Medicine | Admitting: Emergency Medicine

## 2015-05-05 ENCOUNTER — Emergency Department (HOSPITAL_COMMUNITY): Payer: Medicare Other

## 2015-05-05 ENCOUNTER — Encounter (HOSPITAL_COMMUNITY): Payer: Self-pay

## 2015-05-05 DIAGNOSIS — Z8739 Personal history of other diseases of the musculoskeletal system and connective tissue: Secondary | ICD-10-CM | POA: Diagnosis not present

## 2015-05-05 DIAGNOSIS — R110 Nausea: Secondary | ICD-10-CM | POA: Diagnosis present

## 2015-05-05 DIAGNOSIS — I10 Essential (primary) hypertension: Secondary | ICD-10-CM | POA: Insufficient documentation

## 2015-05-05 DIAGNOSIS — E119 Type 2 diabetes mellitus without complications: Secondary | ICD-10-CM | POA: Insufficient documentation

## 2015-05-05 DIAGNOSIS — I5030 Unspecified diastolic (congestive) heart failure: Secondary | ICD-10-CM | POA: Diagnosis not present

## 2015-05-05 DIAGNOSIS — Z8781 Personal history of (healed) traumatic fracture: Secondary | ICD-10-CM | POA: Insufficient documentation

## 2015-05-05 DIAGNOSIS — H409 Unspecified glaucoma: Secondary | ICD-10-CM | POA: Insufficient documentation

## 2015-05-05 DIAGNOSIS — E785 Hyperlipidemia, unspecified: Secondary | ICD-10-CM | POA: Diagnosis not present

## 2015-05-05 DIAGNOSIS — Z794 Long term (current) use of insulin: Secondary | ICD-10-CM | POA: Diagnosis not present

## 2015-05-05 DIAGNOSIS — Z862 Personal history of diseases of the blood and blood-forming organs and certain disorders involving the immune mechanism: Secondary | ICD-10-CM | POA: Insufficient documentation

## 2015-05-05 DIAGNOSIS — N390 Urinary tract infection, site not specified: Secondary | ICD-10-CM

## 2015-05-05 DIAGNOSIS — F0390 Unspecified dementia without behavioral disturbance: Secondary | ICD-10-CM | POA: Insufficient documentation

## 2015-05-05 DIAGNOSIS — Z7982 Long term (current) use of aspirin: Secondary | ICD-10-CM | POA: Diagnosis not present

## 2015-05-05 DIAGNOSIS — Z87891 Personal history of nicotine dependence: Secondary | ICD-10-CM | POA: Insufficient documentation

## 2015-05-05 DIAGNOSIS — I2510 Atherosclerotic heart disease of native coronary artery without angina pectoris: Secondary | ICD-10-CM | POA: Insufficient documentation

## 2015-05-05 DIAGNOSIS — E860 Dehydration: Secondary | ICD-10-CM | POA: Diagnosis not present

## 2015-05-05 DIAGNOSIS — E669 Obesity, unspecified: Secondary | ICD-10-CM | POA: Diagnosis not present

## 2015-05-05 DIAGNOSIS — J40 Bronchitis, not specified as acute or chronic: Secondary | ICD-10-CM

## 2015-05-05 DIAGNOSIS — Z87448 Personal history of other diseases of urinary system: Secondary | ICD-10-CM | POA: Insufficient documentation

## 2015-05-05 DIAGNOSIS — Z951 Presence of aortocoronary bypass graft: Secondary | ICD-10-CM | POA: Diagnosis not present

## 2015-05-05 DIAGNOSIS — Z7952 Long term (current) use of systemic steroids: Secondary | ICD-10-CM | POA: Diagnosis not present

## 2015-05-05 DIAGNOSIS — Z8673 Personal history of transient ischemic attack (TIA), and cerebral infarction without residual deficits: Secondary | ICD-10-CM | POA: Insufficient documentation

## 2015-05-05 DIAGNOSIS — F329 Major depressive disorder, single episode, unspecified: Secondary | ICD-10-CM | POA: Insufficient documentation

## 2015-05-05 DIAGNOSIS — E039 Hypothyroidism, unspecified: Secondary | ICD-10-CM | POA: Diagnosis not present

## 2015-05-05 DIAGNOSIS — K219 Gastro-esophageal reflux disease without esophagitis: Secondary | ICD-10-CM | POA: Insufficient documentation

## 2015-05-05 DIAGNOSIS — Z8742 Personal history of other diseases of the female genital tract: Secondary | ICD-10-CM | POA: Insufficient documentation

## 2015-05-05 DIAGNOSIS — Z79899 Other long term (current) drug therapy: Secondary | ICD-10-CM | POA: Insufficient documentation

## 2015-05-05 DIAGNOSIS — F419 Anxiety disorder, unspecified: Secondary | ICD-10-CM | POA: Diagnosis not present

## 2015-05-05 DIAGNOSIS — G8929 Other chronic pain: Secondary | ICD-10-CM | POA: Diagnosis not present

## 2015-05-05 LAB — URINE MICROSCOPIC-ADD ON

## 2015-05-05 LAB — CBC WITH DIFFERENTIAL/PLATELET
Basophils Absolute: 0 10*3/uL (ref 0.0–0.1)
Basophils Relative: 0 % (ref 0–1)
EOS PCT: 3 % (ref 0–5)
Eosinophils Absolute: 0.2 10*3/uL (ref 0.0–0.7)
HCT: 34.3 % — ABNORMAL LOW (ref 36.0–46.0)
Hemoglobin: 11.2 g/dL — ABNORMAL LOW (ref 12.0–15.0)
LYMPHS ABS: 1.1 10*3/uL (ref 0.7–4.0)
LYMPHS PCT: 14 % (ref 12–46)
MCH: 33 pg (ref 26.0–34.0)
MCHC: 32.7 g/dL (ref 30.0–36.0)
MCV: 101.2 fL — AB (ref 78.0–100.0)
MONO ABS: 1.1 10*3/uL — AB (ref 0.1–1.0)
MONOS PCT: 14 % — AB (ref 3–12)
Neutro Abs: 5.2 10*3/uL (ref 1.7–7.7)
Neutrophils Relative %: 69 % (ref 43–77)
PLATELETS: 177 10*3/uL (ref 150–400)
RBC: 3.39 MIL/uL — AB (ref 3.87–5.11)
RDW: 13.6 % (ref 11.5–15.5)
WBC: 7.7 10*3/uL (ref 4.0–10.5)

## 2015-05-05 LAB — COMPREHENSIVE METABOLIC PANEL
ALT: 25 U/L (ref 14–54)
AST: 28 U/L (ref 15–41)
Albumin: 3.6 g/dL (ref 3.5–5.0)
Alkaline Phosphatase: 70 U/L (ref 38–126)
Anion gap: 8 (ref 5–15)
BUN: 27 mg/dL — ABNORMAL HIGH (ref 6–20)
CHLORIDE: 105 mmol/L (ref 101–111)
CO2: 27 mmol/L (ref 22–32)
Calcium: 9.4 mg/dL (ref 8.9–10.3)
Creatinine, Ser: 1.15 mg/dL — ABNORMAL HIGH (ref 0.44–1.00)
GFR, EST AFRICAN AMERICAN: 51 mL/min — AB (ref >60)
GFR, EST NON AFRICAN AMERICAN: 44 mL/min — AB (ref >60)
Glucose, Bld: 116 mg/dL — ABNORMAL HIGH (ref 65–99)
Potassium: 4.5 mmol/L (ref 3.5–5.1)
Sodium: 140 mmol/L (ref 135–145)
Total Bilirubin: 0.6 mg/dL (ref 0.3–1.2)
Total Protein: 6.5 g/dL (ref 6.5–8.1)

## 2015-05-05 LAB — BRAIN NATRIURETIC PEPTIDE: B Natriuretic Peptide: 668.7 pg/mL — ABNORMAL HIGH (ref 0.0–100.0)

## 2015-05-05 LAB — URINALYSIS, ROUTINE W REFLEX MICROSCOPIC
BILIRUBIN URINE: NEGATIVE
GLUCOSE, UA: NEGATIVE mg/dL
Hgb urine dipstick: NEGATIVE
KETONES UR: NEGATIVE mg/dL
NITRITE: NEGATIVE
Protein, ur: NEGATIVE mg/dL
Specific Gravity, Urine: 1.015 (ref 1.005–1.030)
Urobilinogen, UA: 0.2 mg/dL (ref 0.0–1.0)
pH: 6.5 (ref 5.0–8.0)

## 2015-05-05 LAB — I-STAT TROPONIN, ED
TROPONIN I, POC: 0.01 ng/mL (ref 0.00–0.08)
TROPONIN I, POC: 0.01 ng/mL (ref 0.00–0.08)

## 2015-05-05 MED ORDER — SODIUM CHLORIDE 0.9 % IV BOLUS (SEPSIS)
1000.0000 mL | Freq: Once | INTRAVENOUS | Status: AC
  Administered 2015-05-05: 1000 mL via INTRAVENOUS

## 2015-05-05 MED ORDER — CEPHALEXIN 500 MG PO CAPS: 500.0000 mg | ORAL_CAPSULE | Freq: Three times a day (TID) | ORAL | Status: DC

## 2015-05-05 MED ORDER — CEFTRIAXONE SODIUM 1 G IJ SOLR
1.0000 g | Freq: Once | INTRAMUSCULAR | Status: AC
  Administered 2015-05-05: 1 g via INTRAVENOUS
  Filled 2015-05-05: qty 10

## 2015-05-05 MED ORDER — ALBUTEROL SULFATE HFA 108 (90 BASE) MCG/ACT IN AERS
2.0000 | INHALATION_SPRAY | Freq: Once | RESPIRATORY_TRACT | Status: AC
  Administered 2015-05-05: 2 via RESPIRATORY_TRACT
  Filled 2015-05-05: qty 6.7

## 2015-05-05 NOTE — ED Provider Notes (Signed)
CSN: [id number]     Arrival date & time 05/05/15  1115 History   First MD Initiated Contact with Patient 05/05/15 1115     Chief Complaint  Patient presents with  . Chest Pain  . Shortness of Breath     (Consider location/radiation/quality/duration/timing/severity/associated sxs/prior Treatment) The history is provided by the patient.  [name] [name] is a 81 y.o. female hx of HTN, CAD, DM, here with weakness, fatigue, shortness of breath, nausea. Symptoms since yesterday. Has not been Nauseated since yesterday. Just diffuse weakness as well. Has some chest tightness as well. Denies chest pain and this is not similar to her symptoms prior to her CABG.  She also feels tired. She has hx of dementia and has seen [facility] neuro recently. She sometimes gets confused and forgetful but this is baseline for her. Denies fever or chills or abdominal pain.   Level V caveat- dementia    Past Medical History  Diagnosis Date  . Hypertension   . Depression   . CAD (coronary artery disease)   . GERD (gastroesophageal reflux disease)   . Bilateral carotid artery stenosis   . Diverticulitis   . Obesity   . Degenerative joint disease   . Hiatal hernia   . Atrial fibrillation   . Hypothyroidism   . Type II diabetes mellitus   . Anemia   . Stroke 2011    "old very small TIA/neurologist"  . Chronic lower back pain     "back is very stiff because of fusion; can't stand up straight more than few moments"  . Complication of anesthesia     anesth. meds caused hallucinations   . Anxiety   . Endometriosis   . Fall at home 05/15/2013  . Progressive dementia with uncertain etiology 05/13/9370    Suspect Lewy body .  [name] Bowel incontinence   . HLD (hyperlipidemia)   . Unspecified glaucoma   . Reflux esophagitis   . Hypertonicity of bladder   . Osteoarthrosis, unspecified whether generalized or localized, unspecified site   . Lumbago   . Muscle weakness (generalized)   . Closed fracture of unspecified  part of neck of femur   . Female stress incontinence   . Depressive disorder, not elsewhere classified   . Hx of echocardiogram     Echo (11/15):  Mod LVH, severe septal hypertrophy (suggestive HCM), no LVOT obstruction, rest gradient 15 mmHg across AV, vigorous LVF, EF 65-70%, no RWMA, Gr 1 DD, Ao sclerosis (no stenosis), mod LAE, mild RAE, mod TR, PASP 42 mmHg  . Hx of cardiovascular stress test     Lexiscan Myoview (11/15):  Low risk stress nuclear study with a small, mild fixed apical septal perfusion defect. EF is 52% with mild septal hypokinesis. No ischemia.  . Hypertrophic cardiomyopathy    Past Surgical History  Procedure Laterality Date  . Ptca  03/10/12    LLE  . Coronary artery bypass graft  04/17/2011    LT  internal mammary artery to left anterior descending, saphenous vein graft first diagonal, saphenous  vein graft to obtuse marginal 1, saphenous vein graft to posterior descending  . Cholecystectomy    . Posterior fusion lumbar spine      "removed L4-5, S1"  . Total knee arthroplasty  ~ 2004    right  . Partial hip arthroplasty  11/2009    left; "just the ball"  . Cataract extraction w/ intraocular lens  implant, bilateral    . Neuroplasty / transposition median nerve  at carpal tunnel bilateral    . Hand surgery      "for arthritis; right hand"  . Joint replacement    . Dilation and curettage of uterus      "for endometrosis"  . Scleral buckle Right 01/20/2013    Procedure: SCLERAL BUCKLE;  Surgeon: Hayden Pedro, MD;  Location: [place];  Service: Ophthalmology;  Laterality: Right;  . Laser photo ablation Right 01/20/2013    Procedure: LASER PHOTO ABLATION;  Surgeon: Hayden Pedro, MD;  Location: [place];  Service: Ophthalmology;  Laterality: Right;  . Back surgery      X2  . Carpal tunnel release Bilateral   . Dilation and curettage of uterus      ENDOMETRIOSIS  . Total shoulder arthroplasty Right 10/07/2013    Procedure: TOTAL SHOULDER ARTHROPLASTY;  Surgeon: Alta Corning, MD;  Location: [place];  Service: Orthopedics;  Laterality: Right;  . Lower extremity angiogram N/A 03/10/2012    Procedure: LOWER EXTREMITY ANGIOGRAM;  Surgeon: Jettie Booze, MD;  Location: [facility] CATH LAB;  Service: Cardiovascular;  Laterality: N/A;  . Retinal detachment surgery Right May 2014   Family History  Problem Relation Age of Onset  . Cancer Paternal Aunt   . Cancer Cousin   . Heart attack Mother   . Hypertension Mother   . Heart attack Father   . Heart disease Father     Before age 4  . Hyperlipidemia Father   . Hypertension Father    Social History  Substance Use Topics  . Smoking status: Former Smoker -- 1.50 packs/day    Types: Cigarettes    Quit date: 09/16/1987  . Smokeless tobacco: Never Used  . Alcohol Use: No   OB History    No data available     Review of Systems  Respiratory: Positive for cough and shortness of breath.   Neurological: Positive for weakness.  All other systems reviewed and are negative.     Allergies  Vicodin; Polysaccharide iron complex; Amitriptyline; and Vesicare  Home Medications   Prior to Admission medications   Medication Sig Start Date End Date Taking? Authorizing Provider  acetaminophen (TYLENOL) 500 MG tablet Take 500-1,000 mg by mouth daily as needed (for arthritis pain).    Historical Provider, MD  ascorbic acid (VITAMIN C) 1000 MG tablet Take 1,000 mg by mouth every Monday, Wednesday, and Friday.     Historical Provider, MD  aspirin 81 MG tablet Take 81 mg by mouth daily.    Historical Provider, MD  cholecalciferol (VITAMIN D) 1000 UNITS tablet Take 1,000 Units by mouth See admin instructions. On Monday, Tuesday, Wednesday, Friday, Saturday.    Historical Provider, MD  COMBIGAN 0.2-0.5 % ophthalmic solution  01/09/15   Historical Provider, MD  ergocalciferol (VITAMIN D2) 50000 UNITS capsule Take 50,000 Units by mouth 2 (two) times a week. On Thursday and Sunday    Historical Provider, MD  ezetimibe (ZETIA) 10  MG tablet Take 1 tablet (10 mg total) by mouth daily. 12/07/14   Jettie Booze, MD  felodipine (PLENDIL) 10 MG 24 hr tablet Take two tablets by mouth daily. 01/24/15   Jettie Booze, MD  FLUoxetine (PROZAC) 20 MG capsule Take 20 mg by mouth every morning.     Historical Provider, MD  glipiZIDE (GLUCOTROL XL) 5 MG 24 hr tablet Take 5 mg by mouth at bedtime.     Historical Provider, MD  Insulin Glargine (LANTUS SOLOSTAR) 100 UNIT/ML SOPN Inject 10 Units into  the skin at bedtime.    Historical Provider, MD  levothyroxine (SYNTHROID, LEVOTHROID) 125 MCG tablet Take 125 mcg by mouth daily before breakfast.     Historical Provider, MD  memantine (NAMENDA) 10 MG tablet Take 1 tablet (10 mg total) by mouth 2 (two) times daily. 02/08/15   Dennie Bible, NP  metFORMIN (GLUCOPHAGE) 500 MG tablet Take 500 mg by mouth 2 (two) times daily with a meal.    Historical Provider, MD  metoprolol tartrate (LOPRESSOR) 25 MG tablet Take 25 mg by mouth 2 (two) times daily.      Historical Provider, MD  mirabegron ER (MYRBETRIQ) 50 MG TB24 Take 50 mg by mouth every evening.    Historical Provider, MD  omeprazole (PRILOSEC) 20 MG capsule Take 20 mg by mouth daily. 12/23/13   Historical Provider, MD  pioglitazone (ACTOS) 30 MG tablet Take 30 mg by mouth every morning.     Historical Provider, MD  polyethylene glycol (MIRALAX / GLYCOLAX) packet Take 17 g by mouth daily as needed. Mix 17 gm in 4-8 oz of liquid daily    Historical Provider, MD  pravastatin (PRAVACHOL) 80 MG tablet 1 tablet daily 02/02/15   Jettie Booze, MD  prednisoLONE acetate (PRED FORTE) 1 % ophthalmic suspension  12/12/14   Historical Provider, MD  QUEtiapine (SEROQUEL) 25 MG tablet TAKE 1 TABLET BY MOUTH AT BEDTIME; MAY REPEAT ONCE IF NEEDED 11/06/14   Asencion Partridge Dohmeier, MD   BP 154/54 mmHg  Pulse 54  Temp(Src) 98.1 F (36.7 C) (Oral)  Resp 23  SpO2 98% Physical Exam  Constitutional: She is oriented to person, place, and time.   Chronically ill, demented   HENT:  Head: Normocephalic.  MM slightly dry   Eyes: Conjunctivae are normal. Pupils are equal, round, and reactive to light.  Neck: Normal range of motion. Neck supple.  Cardiovascular: Normal rate, regular rhythm and normal heart sounds.   Pulmonary/Chest: Effort normal.  Diminished, no crackles   Abdominal: Soft. Bowel sounds are normal. She exhibits no distension. There is no tenderness. There is no rebound.  Musculoskeletal: Normal range of motion. She exhibits no edema or tenderness.  Neurological: She is alert and oriented to person, place, and time.  Skin: Skin is warm and dry.  Psychiatric: She has a normal mood and affect. Her behavior is normal. Judgment and thought content normal.  Nursing note and vitals reviewed.   ED Course  Procedures (including critical care time) Labs Review Labs Reviewed  CBC WITH DIFFERENTIAL/PLATELET - Abnormal; Notable for the following:    RBC 3.39 (*)    Hemoglobin 11.2 (*)    HCT 34.3 (*)    MCV 101.2 (*)    Monocytes Relative 14 (*)    Monocytes Absolute 1.1 (*)    All other components within normal limits  COMPREHENSIVE METABOLIC PANEL - Abnormal; Notable for the following:    Glucose, Bld 116 (*)    BUN 27 (*)    Creatinine, Ser 1.15 (*)    GFR calc non Af Amer 44 (*)    GFR calc Af Amer 51 (*)    All other components within normal limits  URINALYSIS, ROUTINE W REFLEX MICROSCOPIC (NOT AT [facility]) - Abnormal; Notable for the following:    Leukocytes, UA TRACE (*)    All other components within normal limits  URINE MICROSCOPIC-ADD ON  I-STAT TROPOININ, ED    Imaging Review No results found. I have personally reviewed and evaluated these images and lab results  as part of my medical decision-making.   EKG Interpretation None       <ECG>  ED ECG REPORT   Date: 05/05/2015  Rate: 70  Rhythm: normal sinus rhythm  QRS Axis: normal  Intervals: normal  ST/T Wave abnormalities: nonspecific ST  changes  Conduction Disutrbances:none  Narrative Interpretation:   Old EKG Reviewed: unchanged  I have personally reviewed the EKG tracing and agree with the computerized printout as noted.   MDM   Final diagnoses:  None   [name] [name] is a 82 y.o. female here with nausea, weakness. Appears dehydrated. Consider ACS as well. Will get labs, CXR, trop x 2, UA.   4:34 PM Trop neg x 2. CXR showed mild CHF. EF was nl on echo several months ago, just has diastolic dysfunction. Doesn't appear volume overloaded. BNP 600, no baseline to compare. Cr slightly elevated consistent with mild dehydration. Given 1 L NS bolus. UA ? UTI. Given ceftriaxone and will dc home with keflex. Has some cough in the ED. Repeat exam showed mild wheezing. Likely has some bronchitis as well. Given albuterol in the ED. Never hypoxic, no retractions. Her weakness is likely multi factorial- mild dehydration, mild UTI, bronchitis. Doesn't appear to be in CHF exacerbation. Doesn't meet admission criteria. Will dc home.     Wandra Arthurs, MD 05/05/15 [phone]

## 2015-05-05 NOTE — Discharge Instructions (Signed)
Stay hydrated.   Take keflex three times daily for a week for urinary tract infection.  Use albuterol every 4 hrs as needed for cough and congestion.   See your doctor.   Return to ER if you have worse chest pain, congestion, weakness, fevers, abdominal pain, vomiting.

## 2015-05-05 NOTE — ED Notes (Signed)
Spoke with Lab and requested BNP to be added to Labs already sent.

## 2015-05-05 NOTE — ED Notes (Signed)
To room via EMS from Dr. Drema Dallas @ [place] office. Onset 3am pt has been awake and restless.  Onset yesterday C/o "wierd feeling" in mid chest, shortness of breath and generalized weakness- fatigue, tired.  Pt is A&O x 4.  EMS reports brief periods of confusion about why pt is being sent to ED.  EKG SR 1st degree.

## 2015-05-07 ENCOUNTER — Ambulatory Visit: Payer: Medicare Other | Admitting: Family

## 2015-05-07 ENCOUNTER — Encounter (HOSPITAL_COMMUNITY): Payer: Medicare Other

## 2015-05-07 LAB — URINE CULTURE

## 2015-05-10 ENCOUNTER — Encounter: Payer: Self-pay | Admitting: Family

## 2015-05-11 ENCOUNTER — Encounter (HOSPITAL_COMMUNITY): Payer: Medicare Other

## 2015-05-11 ENCOUNTER — Ambulatory Visit: Payer: Medicare Other | Admitting: Family

## 2015-05-14 ENCOUNTER — Encounter: Payer: Self-pay | Admitting: Family

## 2015-05-14 ENCOUNTER — Ambulatory Visit (HOSPITAL_COMMUNITY)
Admission: RE | Admit: 2015-05-14 | Discharge: 2015-05-14 | Disposition: A | Discharge status: Home / Self Care | Payer: Medicare Other | Source: Ambulatory Visit | Attending: Family | Admitting: Family

## 2015-05-14 ENCOUNTER — Ambulatory Visit (INDEPENDENT_AMBULATORY_CARE_PROVIDER_SITE_OTHER): Payer: Medicare Other | Admitting: Family

## 2015-05-14 VITALS — BP 116/73 | HR 87 | Temp 100.0°F | Resp 18 | Ht 60.0 in | Wt 180.0 lb

## 2015-05-14 DIAGNOSIS — I70299 Other atherosclerosis of native arteries of extremities, unspecified extremity: Secondary | ICD-10-CM | POA: Diagnosis not present

## 2015-05-14 DIAGNOSIS — I6523 Occlusion and stenosis of bilateral carotid arteries: Secondary | ICD-10-CM | POA: Diagnosis not present

## 2015-05-14 DIAGNOSIS — I739 Peripheral vascular disease, unspecified: Secondary | ICD-10-CM | POA: Diagnosis not present

## 2015-05-14 DIAGNOSIS — E785 Hyperlipidemia, unspecified: Secondary | ICD-10-CM | POA: Diagnosis not present

## 2015-05-14 DIAGNOSIS — L97409 Non-pressure chronic ulcer of unspecified heel and midfoot with unspecified severity: Secondary | ICD-10-CM | POA: Diagnosis not present

## 2015-05-14 DIAGNOSIS — Z87891 Personal history of nicotine dependence: Secondary | ICD-10-CM

## 2015-05-14 DIAGNOSIS — I10 Essential (primary) hypertension: Secondary | ICD-10-CM | POA: Diagnosis not present

## 2015-05-14 DIAGNOSIS — E119 Type 2 diabetes mellitus without complications: Secondary | ICD-10-CM | POA: Diagnosis not present

## 2015-05-14 DIAGNOSIS — I7025 Atherosclerosis of native arteries of other extremities with ulceration: Secondary | ICD-10-CM

## 2015-05-14 NOTE — Progress Notes (Signed)
[facility] [place] HISTORY AND PHYSICAL   MRN : [id number]  History of Present Illness:   [name] [name] is a 81 y.o. female patient of Dr. Donnetta Hutching who has been followed for known extracranial cerebrovascular occlusive disease.  She was lost to follow up from 2012 to 2015 until Dr. Donnetta Hutching saw her in 2015. She returns today as referred by Dr. Amalia Hailey re wound on left foot.  She saw Dr. Amalia Hailey a few days ago with c/o approximately 8 month history of a painful skin lesion at the base of fifth left metatarsal area. The area become progressively more uncomfortable with direct shoe pressure and walking. She is attempted apply ointment and lotion to the area. She also mentions some scaling on the on the heel areas of the right and left feet for the last several months. She's had multiple issues since 2012. She has undergone coronary artery bypass grafting. She does have a diagnosis of dementia and has significant memory loss. She is able to communicate very easily regarding her issues. Several weeks prior to her November 2015 visit with Dr. Donnetta Hutching she had an episode where she had a fluttering sensation in her chest and then was noted to have numbness and aching in her left arm up to her left shoulder. This persisted for several hours and had resolved by the next morning. She does not recall any focal weakness. He denies any other deficits at that time. She's had no prior other problems since that time. Does have chronic atrial fibrillation. She does not walk much as she is afraid of falling, "I'm slow and I don't have a lot of energy".  Patient has not had previous carotid artery intervention.  The patient denies any other history of TIA or stroke symptoms other than mentioned above, specifically the patient denies a history of amaurosis fugax or monocular blindness, denies a history unilateral of facial drooping, denies a history of hemiplegia, and denies a history of receptive or  expressive aphasia.   She states she is afraid of falling.  The patient reports New Medical or Surgical History: right shoulder surgery in 2015 Dr. Irish Lack is her cardiologist  Pt Diabetic: Yes, Review of records: A1C was 6.1 in March 2016 Pt smoker: former smoker, quit in 1989  Pt meds include: Statin : Yes ASA: Yes Other anticoagulants/antiplatelets: no     Current Outpatient Prescriptions  Medication Sig Dispense Refill  . bimatoprost (LUMIGAN) 0.03 % ophthalmic solution 1 drop at bedtime.    [name] ezetimibe (ZETIA) 10 MG tablet Take 1 tablet (10 mg total) by mouth daily. 30 tablet 3  . felodipine (PLENDIL) 10 MG 24 hr tablet Take two tablets by mouth daily. 60 tablet 11  . FLUoxetine (PROZAC) 20 MG capsule Take 20 mg by mouth every morning.     [name] glipiZIDE (GLUCOTROL XL) 5 MG 24 hr tablet Take 5 mg by mouth at bedtime.     . Insulin Glargine (LANTUS SOLOSTAR) 100 UNIT/ML SOPN Inject 10 Units into the skin at bedtime.    [name] levothyroxine (SYNTHROID, LEVOTHROID) 125 MCG tablet Take 125 mcg by mouth daily before breakfast.     . metFORMIN (GLUCOPHAGE) 500 MG tablet Take 500 mg by mouth 2 (two) times daily with a meal.    . metFORMIN (GLUCOPHAGE-XR) 500 MG 24 hr tablet   1  . metoprolol tartrate (LOPRESSOR) 25 MG tablet Take 25 mg by mouth 2 (two) times daily.      [name] omeprazole (PRILOSEC)  20 MG capsule Take 20 mg by mouth daily.    . pioglitazone (ACTOS) 30 MG tablet Take 30 mg by mouth every morning.     . pravastatin (PRAVACHOL) 80 MG tablet 1 tablet daily 90 tablet 2  . rosuvastatin (CRESTOR) 10 MG tablet Take 10 mg by mouth daily.    [name] acetaminophen (TYLENOL) 500 MG tablet Take 500-1,000 mg by mouth daily as needed (for arthritis pain).    [name] ascorbic acid (VITAMIN C) 1000 MG tablet Take 1,000 mg by mouth every Monday, Wednesday, and Friday.     [name] aspirin 81 MG tablet Take 81 mg by mouth daily.    . cephALEXin (KEFLEX) 500 MG capsule Take 1 capsule (500 mg total) by mouth 3  (three) times daily. 21 capsule 0  . cholecalciferol (VITAMIN D) 1000 UNITS tablet Take 1,000 Units by mouth See admin instructions. On Monday, Tuesday, Wednesday, Friday, Saturday.    . COMBIGAN 0.2-0.5 % ophthalmic solution   0  . ergocalciferol (VITAMIN D2) 50000 UNITS capsule Take 50,000 Units by mouth 2 (two) times a week. On Thursday and Sunday    . ILEVRO 0.3 % ophthalmic suspension     . memantine (NAMENDA) 10 MG tablet Take 1 tablet (10 mg total) by mouth 2 (two) times daily. 60 tablet 6  . mirabegron ER (MYRBETRIQ) 50 MG TB24 Take 50 mg by mouth every evening.    . polyethylene glycol (MIRALAX / GLYCOLAX) packet Take 17 g by mouth daily as needed. Mix 17 gm in 4-8 oz of liquid daily    . prednisoLONE acetate (PRED FORTE) 1 % ophthalmic suspension   1  . QUEtiapine (SEROQUEL) 25 MG tablet TAKE 1 TABLET BY MOUTH AT BEDTIME; MAY REPEAT ONCE IF NEEDED 60 tablet 6  . [DISCONTINUED] amiodarone (PACERONE) 200 MG tablet Take 200 mg by mouth 3 (three) times a week.      No current facility-administered medications for this visit.    Past Medical History  Diagnosis Date  . Hypertension   . Depression   . CAD (coronary artery disease)   . GERD (gastroesophageal reflux disease)   . Bilateral carotid artery stenosis   . Diverticulitis   . Obesity   . Degenerative joint disease   . Hiatal hernia   . Atrial fibrillation   . Hypothyroidism   . Type II diabetes mellitus   . Anemia   . Stroke 2011    "old very small TIA/neurologist"  . Chronic lower back pain     "back is very stiff because of fusion; can't stand up straight more than few moments"  . Complication of anesthesia     anesth. meds caused hallucinations   . Anxiety   . Endometriosis   . Fall at home 05/15/2013  . Progressive dementia with uncertain etiology 4/81/8563    Suspect Lewy body .  [name] Bowel incontinence   . HLD (hyperlipidemia)   . Unspecified glaucoma   . Reflux esophagitis   . Hypertonicity of bladder   .  Osteoarthrosis, unspecified whether generalized or localized, unspecified site   . Lumbago   . Muscle weakness (generalized)   . Closed fracture of unspecified part of neck of femur   . Female stress incontinence   . Depressive disorder, not elsewhere classified   . Hx of echocardiogram     Echo (11/15):  Mod LVH, severe septal hypertrophy (suggestive HCM), no LVOT obstruction, rest gradient 15 mmHg across AV, vigorous LVF, EF 65-70%, no RWMA, Gr 1  DD, Ao sclerosis (no stenosis), mod LAE, mild RAE, mod TR, PASP 42 mmHg  . Hx of cardiovascular stress test     Lexiscan Myoview (11/15):  Low risk stress nuclear study with a small, mild fixed apical septal perfusion defect. EF is 52% with mild septal hypokinesis. No ischemia.  . Hypertrophic cardiomyopathy     Social History Social History  Substance Use Topics  . Smoking status: Former Smoker -- 1.50 packs/day    Types: Cigarettes    Quit date: 09/16/1987  . Smokeless tobacco: Never Used  . Alcohol Use: No    Family History Family History  Problem Relation Age of Onset  . Cancer Paternal Aunt   . Cancer Cousin   . Heart attack Mother   . Hypertension Mother   . Heart attack Father   . Heart disease Father     Before age 63  . Hyperlipidemia Father   . Hypertension Father     Surgical History Past Surgical History  Procedure Laterality Date  . Ptca  03/10/12    LLE  . Coronary artery bypass graft  04/17/2011    LT  internal mammary artery to left anterior descending, saphenous vein graft first diagonal, saphenous  vein graft to obtuse marginal 1, saphenous vein graft to posterior descending  . Cholecystectomy    . Posterior fusion lumbar spine      "removed L4-5, S1"  . Total knee arthroplasty  ~ 2004    right  . Partial hip arthroplasty  11/2009    left; "just the ball"  . Cataract extraction w/ intraocular lens  implant, bilateral    . Neuroplasty / transposition median nerve at carpal tunnel bilateral    . Hand  surgery      "for arthritis; right hand"  . Joint replacement    . Dilation and curettage of uterus      "for endometrosis"  . Scleral buckle Right 01/20/2013    Procedure: SCLERAL BUCKLE;  Surgeon: Hayden Pedro, MD;  Location: [facility];  Service: Ophthalmology;  Laterality: Right;  . Laser photo ablation Right 01/20/2013    Procedure: LASER PHOTO ABLATION;  Surgeon: Hayden Pedro, MD;  Location: [facility];  Service: Ophthalmology;  Laterality: Right;  . Back surgery      X2  . Carpal tunnel release Bilateral   . Dilation and curettage of uterus      ENDOMETRIOSIS  . Total shoulder arthroplasty Right 10/07/2013    Procedure: TOTAL SHOULDER ARTHROPLASTY;  Surgeon: Alta Corning, MD;  Location: [facility];  Service: Orthopedics;  Laterality: Right;  . Lower extremity angiogram N/A 03/10/2012    Procedure: LOWER EXTREMITY ANGIOGRAM;  Surgeon: Jettie Booze, MD;  Location: [facility] CATH LAB;  Service: Cardiovascular;  Laterality: N/A;  . Retinal detachment surgery Right May 2014    Allergies  Allergen Reactions  . Vicodin [Hydrocodone-Acetaminophen] Other (See Comments)    Hallucinations - do NOT verify Vicodin orders  . Polysaccharide Iron Complex Hives, Itching and Rash    Patient can tolerate Ferrous Sulfate  . Amitriptyline     hallucinations  . Vesicare [Solifenacin]     hallucinations    Current Outpatient Prescriptions  Medication Sig Dispense Refill  . bimatoprost (LUMIGAN) 0.03 % ophthalmic solution 1 drop at bedtime.    [name] ezetimibe (ZETIA) 10 MG tablet Take 1 tablet (10 mg total) by mouth daily. 30 tablet 3  . felodipine (PLENDIL) 10 MG 24 hr tablet Take two tablets by mouth daily.  60 tablet 11  . FLUoxetine (PROZAC) 20 MG capsule Take 20 mg by mouth every morning.     [name] glipiZIDE (GLUCOTROL XL) 5 MG 24 hr tablet Take 5 mg by mouth at bedtime.     . Insulin Glargine (LANTUS SOLOSTAR) 100 UNIT/ML SOPN Inject 10 Units into the skin at bedtime.    [name] levothyroxine (SYNTHROID,  LEVOTHROID) 125 MCG tablet Take 125 mcg by mouth daily before breakfast.     . metFORMIN (GLUCOPHAGE) 500 MG tablet Take 500 mg by mouth 2 (two) times daily with a meal.    . metFORMIN (GLUCOPHAGE-XR) 500 MG 24 hr tablet   1  . metoprolol tartrate (LOPRESSOR) 25 MG tablet Take 25 mg by mouth 2 (two) times daily.      [name] omeprazole (PRILOSEC) 20 MG capsule Take 20 mg by mouth daily.    . pioglitazone (ACTOS) 30 MG tablet Take 30 mg by mouth every morning.     . pravastatin (PRAVACHOL) 80 MG tablet 1 tablet daily 90 tablet 2  . rosuvastatin (CRESTOR) 10 MG tablet Take 10 mg by mouth daily.    [name] acetaminophen (TYLENOL) 500 MG tablet Take 500-1,000 mg by mouth daily as needed (for arthritis pain).    [name] ascorbic acid (VITAMIN C) 1000 MG tablet Take 1,000 mg by mouth every Monday, Wednesday, and Friday.     [name] aspirin 81 MG tablet Take 81 mg by mouth daily.    . cephALEXin (KEFLEX) 500 MG capsule Take 1 capsule (500 mg total) by mouth 3 (three) times daily. 21 capsule 0  . cholecalciferol (VITAMIN D) 1000 UNITS tablet Take 1,000 Units by mouth See admin instructions. On Monday, Tuesday, Wednesday, Friday, Saturday.    . COMBIGAN 0.2-0.5 % ophthalmic solution   0  . ergocalciferol (VITAMIN D2) 50000 UNITS capsule Take 50,000 Units by mouth 2 (two) times a week. On Thursday and Sunday    . ILEVRO 0.3 % ophthalmic suspension     . memantine (NAMENDA) 10 MG tablet Take 1 tablet (10 mg total) by mouth 2 (two) times daily. 60 tablet 6  . mirabegron ER (MYRBETRIQ) 50 MG TB24 Take 50 mg by mouth every evening.    . polyethylene glycol (MIRALAX / GLYCOLAX) packet Take 17 g by mouth daily as needed. Mix 17 gm in 4-8 oz of liquid daily    . prednisoLONE acetate (PRED FORTE) 1 % ophthalmic suspension   1  . QUEtiapine (SEROQUEL) 25 MG tablet TAKE 1 TABLET BY MOUTH AT BEDTIME; MAY REPEAT ONCE IF NEEDED 60 tablet 6  . [DISCONTINUED] amiodarone (PACERONE) 200 MG tablet Take 200 mg by mouth 3 (three) times a week.       No current facility-administered medications for this visit.     REVIEW OF SYSTEMS: See HPI for pertinent positives and negatives.  Physical Examination Filed Vitals:   05/14/15 1509  BP: 116/73  Pulse: 87  Temp: 100 F (37.8 C)  TempSrc: Oral  Resp: 18  Height: 5' (1.524 m)  Weight: 180 lb (81.647 kg)  SpO2: 94%   Body mass index is 35.15 kg/(m^2).  General: WDWN obese female in NAD GAIT: slow, deliberate, using quad cane Eyes: PERRLA Pulmonary: Non-labored, CTAB, no rales,no rhonchi, & no wheezing.  Cardiac: Irregular rhythm, no detected murmur.  VASCULAR EXAM Carotid Bruits Right Left   Soft, + soft, +   Aorta is not palpable. Radial pulses are 2+ palpable and equal.      LE Pulses Right Left  FEMORAL  not palpable  not palpable    POPLITEAL not palpable  not palpable   POSTERIOR TIBIAL not palpable, monophasic  by Doppler  not palpable, not detectable by Doppler    DORSALIS PEDIS  ANTERIOR TIBIAL not palpable, monophasic by Doppler  not palpable, monophasic by Doppler    PERONEAL non-Dopplerable   non-Dopplerable     Gastrointestinal: softly obese, nontender, BS WNL, no r/g, no palpable masses.  Musculoskeletal: Negative muscle atrophy/wasting. M/S 5/5 throughout, left foot with shallow pale dry ulcer, about 4 mm x 4 mm, lateral aspect left midfoot, scaling on left heel. Dependent rubor in both feet , left worse than right.   Neurologic: A&O X 3; Appropriate Affect,  Speech is normal CN 2-12 intact, Pain and light touch intact in extremities, Motor exam as listed above.           Non-Invasive Vascular Imaging: ABI (Date: 05/14/2015)  R: 0.79 (0.83, 10/01/11 at [facility]), DP: monophasic, PT: monophasic, TBI: 0.29             L: 0.42 (0.47), DP:  monophasic, PT: not detected, peroneal: monophasic,TBI: 0.01    ASSESSMENT:  ANNASTYN SILVEY is a 81 y.o. female  female who had a possible TIA in 2015, no other known TIA or stroke. Carotid Duplex from May 2016 suggests 50-69% bilateral ICA stenoses and bilatal greater than 50% external carotid artery stenosis. No significant change from previous Duplex.  PAD: Dopplerable bilateral DP pulses.  Small dry keratototic ulcer lateral aspect left foot, no signs of infection, no gangrene, + dependent rubor in both feet, left worse than right. She has possible issues with falling, does not walk much. Today's ABI's suggest moderately decreased arterial perfusion to the right LE and severely decreased arterial perfusion to the left LE, monophasic waveforms throughout except left PT is not detectable by Doppler. Right TBI is severely diminished, left TBI is not detectable.  Face to face time with patient was 25 minutes. Over 50% of this time was spent on counseling and coordination of care.   PLAN:   Based on today's exam and non-invasive vascular lab results, and after discussing with Dr. Trula Slade, the patient will be scheduled for an angiogram with Dr. Donnetta Hutching on 05/16/15, possible bilateral run off, possible left LE run off, possible intervention left lower extremity. I discussed in depth with the patient the nature of atherosclerosis, and emphasized the importance of maximal medical management including strict control of blood pressure, blood glucose, and lipid levels, obtaining regular exercise, and cessation of smoking.  The patient is aware that without maximal medical management the underlying atherosclerotic disease process will progress, limiting the benefit of any interventions.  The patient was given information about stroke prevention and what symptoms should prompt the patient to seek immediate medical care.  The patient was given information about PAD including signs, symptoms, treatment,  what symptoms should prompt the patient to seek immediate medical care, and risk reduction measures to take. Thank you for allowing [place] to participate in this patient's care.  Clemon Chambers, RN, MSN, FNP-C [facility] Office: [phone]  Clinic MD: Trula Slade  05/14/2015 3:22 PM

## 2015-05-14 NOTE — Patient Instructions (Signed)
Peripheral Vascular Disease Peripheral Vascular Disease (PVD), also called Peripheral Arterial Disease (PAD), is a circulation problem caused by cholesterol (atherosclerotic plaque) deposits in the arteries. PVD commonly occurs in the lower extremities (legs) but it can occur in other areas of the body, such as your arms. The cholesterol buildup in the arteries reduces blood flow which can cause pain and other serious problems. The presence of PVD can place a person at risk for Coronary Artery Disease (CAD).  CAUSES  Causes of PVD can be many. It is usually associated with more than one risk factor such as:   High Cholesterol.  Smoking.  Diabetes.  Lack of exercise or inactivity.  High blood pressure (hypertension).  Obesity.  Family history. SYMPTOMS   When the lower extremities are affected, patients with PVD may experience:  Leg pain with exertion or physical activity. This is called INTERMITTENT CLAUDICATION. This may present as cramping or numbness with physical activity. The location of the pain is associated with the level of blockage. For example, blockage at the abdominal level (distal abdominal aorta) may result in buttock or hip pain. Lower leg arterial blockage may result in calf pain.  As PVD becomes more severe, pain can develop with less physical activity.  In people with severe PVD, leg pain may occur at rest.  Other PVD signs and symptoms:  Leg numbness or weakness.  Coldness in the affected leg or foot, especially when compared to the other leg.  A change in leg color.  Patients with significant PVD are more prone to ulcers or sores on toes, feet or legs. These may take longer to heal or may reoccur. The ulcers or sores can become infected.  If signs and symptoms of PVD are ignored, gangrene may occur. This can result in the loss of toes or loss of an entire limb.  Not all leg pain is related to PVD. Other medical conditions can cause leg pain such  as:  Blood clots (embolism) or Deep Vein Thrombosis.  Inflammation of the blood vessels (vasculitis).  Spinal stenosis. DIAGNOSIS  Diagnosis of PVD can involve several different types of tests. These can include:  Pulse Volume Recording Method (PVR). This test is simple, painless and does not involve the use of X-rays. PVR involves measuring and comparing the blood pressure in the arms and legs. An ABI (Ankle-Brachial Index) is calculated. The normal ratio of blood pressures is 1. As this number becomes smaller, it indicates more severe disease.  < 0.95 - indicates significant narrowing in one or more leg vessels.  <0.8 - there will usually be pain in the foot, leg or buttock with exercise.  <0.4 - will usually have pain in the legs at rest.  <0.25 - usually indicates limb threatening PVD.  Doppler detection of pulses in the legs. This test is painless and checks to see if you have a pulses in your legs/feet.  A dye or contrast material (a substance that highlights the blood vessels so they show up on x-ray) may be given to help your caregiver better see the arteries for the following tests. The dye is eliminated from your body by the kidney's. Your caregiver may order blood work to check your kidney function and other laboratory values before the following tests are performed:  Magnetic Resonance Angiography (MRA). An MRA is a picture study of the blood vessels and arteries. The MRA machine uses a large magnet to produce images of the blood vessels.  Computed Tomography Angiography (CTA). A CTA   is a specialized x-ray that looks at how the blood flows in your blood vessels. An IV may be inserted into your arm so contrast dye can be injected.  Angiogram. Is a procedure that uses x-rays to look at your blood vessels. This procedure is minimally invasive, meaning a small incision (cut) is made in your groin. A small tube (catheter) is then inserted into the artery of your groin. The catheter  is guided to the blood vessel or artery your caregiver wants to examine. Contrast dye is injected into the catheter. X-rays are then taken of the blood vessel or artery. After the images are obtained, the catheter is taken out. TREATMENT  Treatment of PVD involves many interventions which may include:  Lifestyle changes:  Quitting smoking.  Exercise.  Following a low fat, low cholesterol diet.  Control of diabetes.  Foot care is very important to the PVD patient. Good foot care can help prevent infection.  Medication:  Cholesterol-lowering medicine.  Blood pressure medicine.  Anti-platelet drugs.  Certain medicines may reduce symptoms of Intermittent Claudication.  Interventional/Surgical options:  Angioplasty. An Angioplasty is a procedure that inflates a balloon in the blocked artery. This opens the blocked artery to improve blood flow.  Stent Implant. A wire mesh tube (stent) is placed in the artery. The stent expands and stays in place, allowing the artery to remain open.  Peripheral Bypass Surgery. This is a surgical procedure that reroutes the blood around a blocked artery to help improve blood flow. This type of procedure may be performed if Angioplasty or stent implants are not an option. SEEK IMMEDIATE MEDICAL CARE IF:   You develop pain or numbness in your arms or legs.  Your arm or leg turns cold, becomes blue in color.  You develop redness, warmth, swelling and pain in your arms or legs. MAKE SURE YOU:   Understand these instructions.  Will watch your condition.  Will get help right away if you are not doing well or get worse. Document Released: 10/09/2004 Document Revised: 11/24/2011 Document Reviewed: 09/05/2008 [employer] Patient Information 2015 [employer]. This information is not intended to replace advice given to you by your health care provider. Make sure you discuss any questions you have with your health care provider.   Stroke  Prevention Some medical conditions and behaviors are associated with an increased chance of having a stroke. You may prevent a stroke by making healthy choices and managing medical conditions. HOW CAN I REDUCE MY RISK OF HAVING A STROKE?   Stay physically active. Get at least 30 minutes of activity on most or all days.  Do not smoke. It may also be helpful to avoid exposure to secondhand smoke.  Limit alcohol use. Moderate alcohol use is considered to be:  No more than 2 drinks per day for men.  No more than 1 drink per day for nonpregnant women.  Eat healthy foods. This involves:  Eating 5 or more servings of fruits and vegetables a day.  Making dietary changes that address high blood pressure (hypertension), high cholesterol, diabetes, or obesity.  Manage your cholesterol levels.  Making food choices that are high in fiber and low in saturated fat, trans fat, and cholesterol may control cholesterol levels.  Take any prescribed medicines to control cholesterol as directed by your health care provider.  Manage your diabetes.  Controlling your carbohydrate and sugar intake is recommended to manage diabetes.  Take any prescribed medicines to control diabetes as directed by your health care provider.    Control your hypertension.  Making food choices that are low in salt (sodium), saturated fat, trans fat, and cholesterol is recommended to manage hypertension.  Take any prescribed medicines to control hypertension as directed by your health care provider.  Maintain a healthy weight.  Reducing calorie intake and making food choices that are low in sodium, saturated fat, trans fat, and cholesterol are recommended to manage weight.  Stop drug abuse.  Avoid taking birth control pills.  Talk to your health care provider about the risks of taking birth control pills if you are over 35 years old, smoke, get migraines, or have ever had a blood clot.  Get evaluated for sleep  disorders (sleep apnea).  Talk to your health care provider about getting a sleep evaluation if you snore a lot or have excessive sleepiness.  Take medicines only as directed by your health care provider.  For some people, aspirin or blood thinners (anticoagulants) are helpful in reducing the risk of forming abnormal blood clots that can lead to stroke. If you have the irregular heart rhythm of atrial fibrillation, you should be on a blood thinner unless there is a good reason you cannot take them.  Understand all your medicine instructions.  Make sure that other conditions (such as anemia or atherosclerosis) are addressed. SEEK IMMEDIATE MEDICAL CARE IF:   You have sudden weakness or numbness of the face, arm, or leg, especially on one side of the body.  Your face or eyelid droops to one side.  You have sudden confusion.  You have trouble speaking (aphasia) or understanding.  You have sudden trouble seeing in one or both eyes.  You have sudden trouble walking.  You have dizziness.  You have a loss of balance or coordination.  You have a sudden, severe headache with no known cause.  You have new chest pain or an irregular heartbeat. Any of these symptoms may represent a serious problem that is an emergency. Do not wait to see if the symptoms will go away. Get medical help at once. Call your local emergency services (911 in [place] Do not drive yourself to the hospital. Document Released: 10/09/2004 Document Revised: 01/16/2014 Document Reviewed: 03/04/2013 [employer] Patient Information 2015 [employer]. This information is not intended to replace advice given to you by your health care provider. Make sure you discuss any questions you have with your health care provider.  

## 2015-05-15 ENCOUNTER — Other Ambulatory Visit: Payer: Self-pay

## 2015-05-16 ENCOUNTER — Ambulatory Visit (HOSPITAL_COMMUNITY)
Admission: RE | Admit: 2015-05-16 | Discharge: 2015-05-16 | Disposition: A | Discharge status: Home / Self Care | Payer: Medicare Other | Source: Ambulatory Visit | Attending: Vascular Surgery | Admitting: Vascular Surgery

## 2015-05-16 ENCOUNTER — Encounter (HOSPITAL_COMMUNITY)
Admission: RE | Disposition: A | Discharge status: Home / Self Care | Payer: Self-pay | Source: Ambulatory Visit | Attending: Vascular Surgery

## 2015-05-16 DIAGNOSIS — Z7952 Long term (current) use of systemic steroids: Secondary | ICD-10-CM | POA: Diagnosis not present

## 2015-05-16 DIAGNOSIS — I482 Chronic atrial fibrillation: Secondary | ICD-10-CM | POA: Diagnosis not present

## 2015-05-16 DIAGNOSIS — E669 Obesity, unspecified: Secondary | ICD-10-CM | POA: Diagnosis not present

## 2015-05-16 DIAGNOSIS — Z951 Presence of aortocoronary bypass graft: Secondary | ICD-10-CM | POA: Insufficient documentation

## 2015-05-16 DIAGNOSIS — I70244 Atherosclerosis of native arteries of left leg with ulceration of heel and midfoot: Secondary | ICD-10-CM | POA: Insufficient documentation

## 2015-05-16 DIAGNOSIS — Z87891 Personal history of nicotine dependence: Secondary | ICD-10-CM | POA: Diagnosis not present

## 2015-05-16 DIAGNOSIS — F329 Major depressive disorder, single episode, unspecified: Secondary | ICD-10-CM | POA: Insufficient documentation

## 2015-05-16 DIAGNOSIS — I2510 Atherosclerotic heart disease of native coronary artery without angina pectoris: Secondary | ICD-10-CM | POA: Insufficient documentation

## 2015-05-16 DIAGNOSIS — F0390 Unspecified dementia without behavioral disturbance: Secondary | ICD-10-CM | POA: Insufficient documentation

## 2015-05-16 DIAGNOSIS — M1990 Unspecified osteoarthritis, unspecified site: Secondary | ICD-10-CM | POA: Insufficient documentation

## 2015-05-16 DIAGNOSIS — K210 Gastro-esophageal reflux disease with esophagitis: Secondary | ICD-10-CM | POA: Diagnosis not present

## 2015-05-16 DIAGNOSIS — E039 Hypothyroidism, unspecified: Secondary | ICD-10-CM | POA: Insufficient documentation

## 2015-05-16 DIAGNOSIS — I70245 Atherosclerosis of native arteries of left leg with ulceration of other part of foot: Secondary | ICD-10-CM | POA: Diagnosis not present

## 2015-05-16 DIAGNOSIS — I422 Other hypertrophic cardiomyopathy: Secondary | ICD-10-CM | POA: Insufficient documentation

## 2015-05-16 DIAGNOSIS — Z79899 Other long term (current) drug therapy: Secondary | ICD-10-CM | POA: Insufficient documentation

## 2015-05-16 DIAGNOSIS — Z794 Long term (current) use of insulin: Secondary | ICD-10-CM | POA: Insufficient documentation

## 2015-05-16 DIAGNOSIS — Z7982 Long term (current) use of aspirin: Secondary | ICD-10-CM | POA: Diagnosis not present

## 2015-05-16 DIAGNOSIS — F419 Anxiety disorder, unspecified: Secondary | ICD-10-CM | POA: Insufficient documentation

## 2015-05-16 DIAGNOSIS — Z6835 Body mass index (BMI) 35.0-35.9, adult: Secondary | ICD-10-CM | POA: Diagnosis not present

## 2015-05-16 DIAGNOSIS — Z8673 Personal history of transient ischemic attack (TIA), and cerebral infarction without residual deficits: Secondary | ICD-10-CM | POA: Diagnosis not present

## 2015-05-16 DIAGNOSIS — L97429 Non-pressure chronic ulcer of left heel and midfoot with unspecified severity: Secondary | ICD-10-CM | POA: Diagnosis not present

## 2015-05-16 DIAGNOSIS — L98499 Non-pressure chronic ulcer of skin of other sites with unspecified severity: Secondary | ICD-10-CM | POA: Diagnosis present

## 2015-05-16 DIAGNOSIS — E119 Type 2 diabetes mellitus without complications: Secondary | ICD-10-CM | POA: Insufficient documentation

## 2015-05-16 DIAGNOSIS — E785 Hyperlipidemia, unspecified: Secondary | ICD-10-CM | POA: Diagnosis not present

## 2015-05-16 HISTORY — PX: PERIPHERAL VASCULAR CATHETERIZATION: SHX172C

## 2015-05-16 LAB — POCT I-STAT, CHEM 8
BUN: 30 mg/dL — AB (ref 6–20)
CREATININE: 1 mg/dL (ref 0.44–1.00)
Calcium, Ion: 1.16 mmol/L (ref 1.13–1.30)
Chloride: 105 mmol/L (ref 101–111)
Glucose, Bld: 133 mg/dL — ABNORMAL HIGH (ref 65–99)
HEMATOCRIT: 35 % — AB (ref 36.0–46.0)
Hemoglobin: 11.9 g/dL — ABNORMAL LOW (ref 12.0–15.0)
POTASSIUM: 4.4 mmol/L (ref 3.5–5.1)
Sodium: 142 mmol/L (ref 135–145)
TCO2: 24 mmol/L (ref 0–100)

## 2015-05-16 LAB — GLUCOSE, CAPILLARY: Glucose-Capillary: 122 mg/dL — ABNORMAL HIGH (ref 65–99)

## 2015-05-16 SURGERY — ABDOMINAL AORTOGRAM

## 2015-05-16 MED ORDER — HEPARIN (PORCINE) IN NACL 2-0.9 UNIT/ML-% IJ SOLN
INTRAMUSCULAR | Status: AC
  Filled 2015-05-16: qty 1000

## 2015-05-16 MED ORDER — HYDRALAZINE HCL 20 MG/ML IJ SOLN: 5.0000 mg | INTRAMUSCULAR | Status: DC | PRN

## 2015-05-16 MED ORDER — MORPHINE SULFATE (PF) 10 MG/ML IV SOLN: 2.0000 mg | INTRAVENOUS | Status: DC | PRN

## 2015-05-16 MED ORDER — ACETAMINOPHEN 325 MG PO TABS: 325.0000 mg | ORAL_TABLET | ORAL | Status: DC | PRN

## 2015-05-16 MED ORDER — LABETALOL HCL 5 MG/ML IV SOLN: 10.0000 mg | INTRAVENOUS | Status: DC | PRN

## 2015-05-16 MED ORDER — METOPROLOL TARTRATE 1 MG/ML IV SOLN: 2.0000 mg | INTRAVENOUS | Status: DC | PRN

## 2015-05-16 MED ORDER — ACETAMINOPHEN 325 MG RE SUPP: 325.0000 mg | RECTAL | Status: DC | PRN

## 2015-05-16 MED ORDER — SODIUM CHLORIDE 0.9 % IV SOLN
INTRAVENOUS | Status: DC
  Administered 2015-05-16: 12:00:00 via INTRAVENOUS

## 2015-05-16 MED ORDER — LIDOCAINE HCL (PF) 1 % IJ SOLN
INTRAMUSCULAR | Status: AC
Start: 2015-05-16 — End: 2015-05-16
  Filled 2015-05-16: qty 30

## 2015-05-16 MED ORDER — LIDOCAINE HCL (PF) 1 % IJ SOLN
INTRAMUSCULAR | Status: DC | PRN
  Administered 2015-05-16: 12 mL

## 2015-05-16 MED ORDER — SODIUM CHLORIDE 0.45 % IV SOLN
INTRAVENOUS | Status: DC
  Administered 2015-05-16: 16:00:00 via INTRAVENOUS

## 2015-05-16 MED ORDER — ONDANSETRON HCL 4 MG/2ML IJ SOLN: 4.0000 mg | Freq: Four times a day (QID) | INTRAMUSCULAR | Status: DC | PRN

## 2015-05-16 SURGICAL SUPPLY — 12 items
CATH CROSS OVER TEMPO 5F (CATHETERS) ×3 IMPLANT
CATH OMNI FLUSH 5F 65CM (CATHETERS) ×3 IMPLANT
CATH STRAIGHT 5FR 65CM (CATHETERS) ×3 IMPLANT
COVER PRB 48X5XTLSCP FOLD TPE (BAG) ×4 IMPLANT
COVER PROBE 5X48 (BAG) ×2
GUIDEWIRE ANGLED .035X150CM (WIRE) ×3 IMPLANT
KIT PV (KITS) ×3 IMPLANT
SHEATH PINNACLE 5F 10CM (SHEATH) ×3 IMPLANT
SYR MEDRAD MARK V 150ML (SYRINGE) ×3 IMPLANT
TRANSDUCER W/STOPCOCK (MISCELLANEOUS) ×3 IMPLANT
TRAY PV CATH (CUSTOM PROCEDURE TRAY) ×3 IMPLANT
WIRE HITORQ VERSACORE ST 145CM (WIRE) ×3 IMPLANT

## 2015-05-16 NOTE — Discharge Instructions (Signed)

## 2015-05-16 NOTE — Interval H&P Note (Signed)
History and Physical Interval Note:  05/16/2015 1:59 PM  [name] [name]  has presented today for surgery, with the diagnosis of pvd  The various methods of treatment have been discussed with the patient and family. After consideration of risks, benefits and other options for treatment, the patient has consented to  Procedure(s): Abdominal Aortogram w/Lower Extremity (N/A) as a surgical intervention .  The patient's history has been reviewed, patient examined, no change in status, stable for surgery.  I have reviewed the patient's chart and labs.  Questions were answered to the patient's satisfaction.     [name]

## 2015-05-16 NOTE — Progress Notes (Signed)
Site area: rt groin Site Prior to Removal:  Level 0 Pressure Applied For:  20 minutes Manual:   yes Patient Status During Pull:  stable Post Pull Site:  Level  0 Post Pull Instructions Given:  yes Post Pull Pulses Present: yes Dressing Applied:  tegaderm Bedrest begins @ 1600 Comments:

## 2015-05-16 NOTE — H&P (View-Only) (Signed)
[facility] [place] HISTORY AND PHYSICAL   MRN : [id number]  History of Present Illness:   [name] [name] is a 81 y.o. female patient of Dr. Early who has been followed for known extracranial cerebrovascular occlusive disease.  She was lost to follow up from 2012 to 2015 until Dr. Early saw her in 2015. She returns today as referred by Dr. Tuchman re wound on left foot.  She saw Dr. Tuchman a few days ago with c/o approximately 8 month history of a painful skin lesion at the base of fifth left metatarsal area. The area become progressively more uncomfortable with direct shoe pressure and walking. She is attempted apply ointment and lotion to the area. She also mentions some scaling on the on the heel areas of the right and left feet for the last several months. She's had multiple issues since 2012. She has undergone coronary artery bypass grafting. She does have a diagnosis of dementia and has significant memory loss. She is able to communicate very easily regarding her issues. Several weeks prior to her November 2015 visit with Dr. Early she had an episode where she had a fluttering sensation in her chest and then was noted to have numbness and aching in her left arm up to her left shoulder. This persisted for several hours and had resolved by the next morning. She does not recall any focal weakness. He denies any other deficits at that time. She's had no prior other problems since that time. Does have chronic atrial fibrillation. She does not walk much as she is afraid of falling, "I'm slow and I don't have a lot of energy".  Patient has not had previous carotid artery intervention.  The patient denies any other history of TIA or stroke symptoms other than mentioned above, specifically the patient denies a history of amaurosis fugax or monocular blindness, denies a history unilateral of facial drooping, denies a history of hemiplegia, and denies a history of receptive or  expressive aphasia.   She states she is afraid of falling.  The patient reports New Medical or Surgical History: right shoulder surgery in 2015 Dr. Varanasi is her cardiologist  Pt Diabetic: Yes, Review of records: A1C was 6.1 in March 2016 Pt smoker: former smoker, quit in 1989  Pt meds include: Statin : Yes ASA: Yes Other anticoagulants/antiplatelets: no     Current Outpatient Prescriptions  Medication Sig Dispense Refill  . bimatoprost (LUMIGAN) 0.03 % ophthalmic solution 1 drop at bedtime.    . ezetimibe (ZETIA) 10 MG tablet Take 1 tablet (10 mg total) by mouth daily. 30 tablet 3  . felodipine (PLENDIL) 10 MG 24 hr tablet Take two tablets by mouth daily. 60 tablet 11  . FLUoxetine (PROZAC) 20 MG capsule Take 20 mg by mouth every morning.     . glipiZIDE (GLUCOTROL XL) 5 MG 24 hr tablet Take 5 mg by mouth at bedtime.     . Insulin Glargine (LANTUS SOLOSTAR) 100 UNIT/ML SOPN Inject 10 Units into the skin at bedtime.    . levothyroxine (SYNTHROID, LEVOTHROID) 125 MCG tablet Take 125 mcg by mouth daily before breakfast.     . metFORMIN (GLUCOPHAGE) 500 MG tablet Take 500 mg by mouth 2 (two) times daily with a meal.    . metFORMIN (GLUCOPHAGE-XR) 500 MG 24 hr tablet   1  . metoprolol tartrate (LOPRESSOR) 25 MG tablet Take 25 mg by mouth 2 (two) times daily.      . omeprazole (PRILOSEC)   20 MG capsule Take 20 mg by mouth daily.    . pioglitazone (ACTOS) 30 MG tablet Take 30 mg by mouth every morning.     . pravastatin (PRAVACHOL) 80 MG tablet 1 tablet daily 90 tablet 2  . rosuvastatin (CRESTOR) 10 MG tablet Take 10 mg by mouth daily.    . acetaminophen (TYLENOL) 500 MG tablet Take 500-1,000 mg by mouth daily as needed (for arthritis pain).    . ascorbic acid (VITAMIN C) 1000 MG tablet Take 1,000 mg by mouth every Monday, Wednesday, and Friday.     . aspirin 81 MG tablet Take 81 mg by mouth daily.    . cephALEXin (KEFLEX) 500 MG capsule Take 1 capsule (500 mg total) by mouth 3  (three) times daily. 21 capsule 0  . cholecalciferol (VITAMIN D) 1000 UNITS tablet Take 1,000 Units by mouth See admin instructions. On Monday, Tuesday, Wednesday, Friday, Saturday.    . COMBIGAN 0.2-0.5 % ophthalmic solution   0  . ergocalciferol (VITAMIN D2) 50000 UNITS capsule Take 50,000 Units by mouth 2 (two) times a week. On Thursday and Sunday    . ILEVRO 0.3 % ophthalmic suspension     . memantine (NAMENDA) 10 MG tablet Take 1 tablet (10 mg total) by mouth 2 (two) times daily. 60 tablet 6  . mirabegron ER (MYRBETRIQ) 50 MG TB24 Take 50 mg by mouth every evening.    . polyethylene glycol (MIRALAX / GLYCOLAX) packet Take 17 g by mouth daily as needed. Mix 17 gm in 4-8 oz of liquid daily    . prednisoLONE acetate (PRED FORTE) 1 % ophthalmic suspension   1  . QUEtiapine (SEROQUEL) 25 MG tablet TAKE 1 TABLET BY MOUTH AT BEDTIME; MAY REPEAT ONCE IF NEEDED 60 tablet 6  . [DISCONTINUED] amiodarone (PACERONE) 200 MG tablet Take 200 mg by mouth 3 (three) times a week.      No current facility-administered medications for this visit.    Past Medical History  Diagnosis Date  . Hypertension   . Depression   . CAD (coronary artery disease)   . GERD (gastroesophageal reflux disease)   . Bilateral carotid artery stenosis   . Diverticulitis   . Obesity   . Degenerative joint disease   . Hiatal hernia   . Atrial fibrillation   . Hypothyroidism   . Type II diabetes mellitus   . Anemia   . Stroke 2011    "old very small TIA/neurologist"  . Chronic lower back pain     "back is very stiff because of fusion; can't stand up straight more than few moments"  . Complication of anesthesia     anesth. meds caused hallucinations   . Anxiety   . Endometriosis   . Fall at home 05/15/2013  . Progressive dementia with uncertain etiology 05/15/2013    Suspect Lewy body .  . Bowel incontinence   . HLD (hyperlipidemia)   . Unspecified glaucoma   . Reflux esophagitis   . Hypertonicity of bladder   .  Osteoarthrosis, unspecified whether generalized or localized, unspecified site   . Lumbago   . Muscle weakness (generalized)   . Closed fracture of unspecified part of neck of femur   . Female stress incontinence   . Depressive disorder, not elsewhere classified   . Hx of echocardiogram     Echo (11/15):  Mod LVH, severe septal hypertrophy (suggestive HCM), no LVOT obstruction, rest gradient 15 mmHg across AV, vigorous LVF, EF 65-70%, no RWMA, Gr 1   DD, Ao sclerosis (no stenosis), mod LAE, mild RAE, mod TR, PASP 42 mmHg  . Hx of cardiovascular stress test     Lexiscan Myoview (11/15):  Low risk stress nuclear study with a small, mild fixed apical septal perfusion defect. EF is 52% with mild septal hypokinesis. No ischemia.  . Hypertrophic cardiomyopathy     Social History Social History  Substance Use Topics  . Smoking status: Former Smoker -- 1.50 packs/day    Types: Cigarettes    Quit date: 09/16/1987  . Smokeless tobacco: Never Used  . Alcohol Use: No    Family History Family History  Problem Relation Age of Onset  . Cancer Paternal Aunt   . Cancer Cousin   . Heart attack Mother   . Hypertension Mother   . Heart attack Father   . Heart disease Father     Before age 60  . Hyperlipidemia Father   . Hypertension Father     Surgical History Past Surgical History  Procedure Laterality Date  . Ptca  03/10/12    LLE  . Coronary artery bypass graft  04/17/2011    LT  internal mammary artery to left anterior descending, saphenous vein graft first diagonal, saphenous  vein graft to obtuse marginal 1, saphenous vein graft to posterior descending  . Cholecystectomy    . Posterior fusion lumbar spine      "removed L4-5, S1"  . Total knee arthroplasty  ~ 2004    right  . Partial hip arthroplasty  11/2009    left; "just the ball"  . Cataract extraction w/ intraocular lens  implant, bilateral    . Neuroplasty / transposition median nerve at carpal tunnel bilateral    . Hand  surgery      "for arthritis; right hand"  . Joint replacement    . Dilation and curettage of uterus      "for endometrosis"  . Scleral buckle Right 01/20/2013    Procedure: SCLERAL BUCKLE;  Surgeon: John D Matthews, MD;  Location: [facility] OR;  Service: Ophthalmology;  Laterality: Right;  . Laser photo ablation Right 01/20/2013    Procedure: LASER PHOTO ABLATION;  Surgeon: John D Matthews, MD;  Location: [facility] OR;  Service: Ophthalmology;  Laterality: Right;  . Back surgery      X2  . Carpal tunnel release Bilateral   . Dilation and curettage of uterus      ENDOMETRIOSIS  . Total shoulder arthroplasty Right 10/07/2013    Procedure: TOTAL SHOULDER ARTHROPLASTY;  Surgeon: John L Graves, MD;  Location: [facility] OR;  Service: Orthopedics;  Laterality: Right;  . Lower extremity angiogram N/A 03/10/2012    Procedure: LOWER EXTREMITY ANGIOGRAM;  Surgeon: Jayadeep S Varanasi, MD;  Location: [facility] CATH LAB;  Service: Cardiovascular;  Laterality: N/A;  . Retinal detachment surgery Right May 2014    Allergies  Allergen Reactions  . Vicodin [Hydrocodone-Acetaminophen] Other (See Comments)    Hallucinations - do NOT verify Vicodin orders  . Polysaccharide Iron Complex Hives, Itching and Rash    Patient can tolerate Ferrous Sulfate  . Amitriptyline     hallucinations  . Vesicare [Solifenacin]     hallucinations    Current Outpatient Prescriptions  Medication Sig Dispense Refill  . bimatoprost (LUMIGAN) 0.03 % ophthalmic solution 1 drop at bedtime.    . ezetimibe (ZETIA) 10 MG tablet Take 1 tablet (10 mg total) by mouth daily. 30 tablet 3  . felodipine (PLENDIL) 10 MG 24 hr tablet Take two tablets by mouth daily.   60 tablet 11  . FLUoxetine (PROZAC) 20 MG capsule Take 20 mg by mouth every morning.     . glipiZIDE (GLUCOTROL XL) 5 MG 24 hr tablet Take 5 mg by mouth at bedtime.     . Insulin Glargine (LANTUS SOLOSTAR) 100 UNIT/ML SOPN Inject 10 Units into the skin at bedtime.    . levothyroxine (SYNTHROID,  LEVOTHROID) 125 MCG tablet Take 125 mcg by mouth daily before breakfast.     . metFORMIN (GLUCOPHAGE) 500 MG tablet Take 500 mg by mouth 2 (two) times daily with a meal.    . metFORMIN (GLUCOPHAGE-XR) 500 MG 24 hr tablet   1  . metoprolol tartrate (LOPRESSOR) 25 MG tablet Take 25 mg by mouth 2 (two) times daily.      . omeprazole (PRILOSEC) 20 MG capsule Take 20 mg by mouth daily.    . pioglitazone (ACTOS) 30 MG tablet Take 30 mg by mouth every morning.     . pravastatin (PRAVACHOL) 80 MG tablet 1 tablet daily 90 tablet 2  . rosuvastatin (CRESTOR) 10 MG tablet Take 10 mg by mouth daily.    . acetaminophen (TYLENOL) 500 MG tablet Take 500-1,000 mg by mouth daily as needed (for arthritis pain).    . ascorbic acid (VITAMIN C) 1000 MG tablet Take 1,000 mg by mouth every Monday, Wednesday, and Friday.     . aspirin 81 MG tablet Take 81 mg by mouth daily.    . cephALEXin (KEFLEX) 500 MG capsule Take 1 capsule (500 mg total) by mouth 3 (three) times daily. 21 capsule 0  . cholecalciferol (VITAMIN D) 1000 UNITS tablet Take 1,000 Units by mouth See admin instructions. On Monday, Tuesday, Wednesday, Friday, Saturday.    . COMBIGAN 0.2-0.5 % ophthalmic solution   0  . ergocalciferol (VITAMIN D2) 50000 UNITS capsule Take 50,000 Units by mouth 2 (two) times a week. On Thursday and Sunday    . ILEVRO 0.3 % ophthalmic suspension     . memantine (NAMENDA) 10 MG tablet Take 1 tablet (10 mg total) by mouth 2 (two) times daily. 60 tablet 6  . mirabegron ER (MYRBETRIQ) 50 MG TB24 Take 50 mg by mouth every evening.    . polyethylene glycol (MIRALAX / GLYCOLAX) packet Take 17 g by mouth daily as needed. Mix 17 gm in 4-8 oz of liquid daily    . prednisoLONE acetate (PRED FORTE) 1 % ophthalmic suspension   1  . QUEtiapine (SEROQUEL) 25 MG tablet TAKE 1 TABLET BY MOUTH AT BEDTIME; MAY REPEAT ONCE IF NEEDED 60 tablet 6  . [DISCONTINUED] amiodarone (PACERONE) 200 MG tablet Take 200 mg by mouth 3 (three) times a week.       No current facility-administered medications for this visit.     REVIEW OF SYSTEMS: See HPI for pertinent positives and negatives.  Physical Examination Filed Vitals:   05/14/15 1509  BP: 116/73  Pulse: 87  Temp: 100 F (37.8 C)  TempSrc: Oral  Resp: 18  Height: 5' (1.524 m)  Weight: 180 lb (81.647 kg)  SpO2: 94%   Body mass index is 35.15 kg/(m^2).  General: WDWN obese female in NAD GAIT: slow, deliberate, using quad cane Eyes: PERRLA Pulmonary: Non-labored, CTAB, no rales,no rhonchi, & no wheezing.  Cardiac: Irregular rhythm, no detected murmur.  VASCULAR EXAM Carotid Bruits Right Left   Soft, + soft, +   Aorta is not palpable. Radial pulses are 2+ palpable and equal.      LE Pulses Right Left     FEMORAL  not palpable  not palpable    POPLITEAL not palpable  not palpable   POSTERIOR TIBIAL not palpable, monophasic  by Doppler  not palpable, not detectable by Doppler    DORSALIS PEDIS  ANTERIOR TIBIAL not palpable, monophasic by Doppler  not palpable, monophasic by Doppler    PERONEAL non-Dopplerable   non-Dopplerable     Gastrointestinal: softly obese, nontender, BS WNL, no r/g, no palpable masses.  Musculoskeletal: Negative muscle atrophy/wasting. M/S 5/5 throughout, left foot with shallow pale dry ulcer, about 4 mm x 4 mm, lateral aspect left midfoot, scaling on left heel. Dependent rubor in both feet , left worse than right.   Neurologic: A&O X 3; Appropriate Affect,  Speech is normal CN 2-12 intact, Pain and light touch intact in extremities, Motor exam as listed above.           Non-Invasive Vascular Imaging: ABI (Date: 05/14/2015)  R: 0.79 (0.83, 10/01/11 at [facility]), DP: monophasic, PT: monophasic, TBI: 0.29             L: 0.42 (0.47), DP:  monophasic, PT: not detected, peroneal: monophasic,TBI: 0.01    ASSESSMENT:  [name] [name] is a 81 y.o. female  female who had a possible TIA in 2015, no other known TIA or stroke. Carotid Duplex from May 2016 suggests 50-69% bilateral ICA stenoses and bilatal greater than 50% external carotid artery stenosis. No significant change from previous Duplex.  PAD: Dopplerable bilateral DP pulses.  Small dry keratototic ulcer lateral aspect left foot, no signs of infection, no gangrene, + dependent rubor in both feet, left worse than right. She has possible issues with falling, does not walk much. Today's ABI's suggest moderately decreased arterial perfusion to the right LE and severely decreased arterial perfusion to the left LE, monophasic waveforms throughout except left PT is not detectable by Doppler. Right TBI is severely diminished, left TBI is not detectable.  Face to face time with patient was 25 minutes. Over 50% of this time was spent on counseling and coordination of care.   PLAN:   Based on today's exam and non-invasive vascular lab results, and after discussing with Dr. Brabham, the patient will be scheduled for an angiogram with Dr. Early on 05/16/15, possible bilateral run off, possible left LE run off, possible intervention left lower extremity. I discussed in depth with the patient the nature of atherosclerosis, and emphasized the importance of maximal medical management including strict control of blood pressure, blood glucose, and lipid levels, obtaining regular exercise, and cessation of smoking.  The patient is aware that without maximal medical management the underlying atherosclerotic disease process will progress, limiting the benefit of any interventions.  The patient was given information about stroke prevention and what symptoms should prompt the patient to seek immediate medical care.  The patient was given information about PAD including signs, symptoms, treatment,  what symptoms should prompt the patient to seek immediate medical care, and risk reduction measures to take. Thank you for allowing us to participate in this patient's care.  Anicia Leuthold, RN, MSN, FNP-C [facility] Office: [phone]  Clinic MD: Brabham  05/14/2015 3:22 PM    

## 2015-05-16 NOTE — Progress Notes (Signed)
Patient ID: LAKODA RASKE, female   DOB: 1934-03-29, 81 y.o.   MRN: [id number] The patient is known to me from prior evaluation for asymptomatic carotid disease. She presented to our office earlier this week with some callus formation on her lateral left foot. No true open ulceration. The patient reports that this is a improving in her opinion. She did undergo arteriogram today with Dr. Oneida Alar. I discussed this with the patient and also with her daughter. 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  I feel that she does have adequate flow to not have limb threatening tissue loss. She will continue her follow-up with Dr. Amalia Hailey. Would require femoropopliteal bypass if she has progression of the foot lesion. Hopefully with protection of this area she will have a complete healing and not require bypass.

## 2015-05-16 NOTE — Interval H&P Note (Signed)
History and Physical Interval Note:  05/16/2015 1:58 PM  [name] [name]  has presented today for surgery, with the diagnosis of pvd  The various methods of treatment have been discussed with the patient and family. After consideration of risks, benefits and other options for treatment, the patient has consented to  Procedure(s): Abdominal Aortogram w/Lower Extremity (N/A) as a surgical intervention .  The patient's history has been reviewed, patient examined, no change in status, stable for surgery.  I have reviewed the patient's chart and labs.  Questions were answered to the patient's satisfaction.     [name]

## 2015-05-17 ENCOUNTER — Encounter (HOSPITAL_COMMUNITY): Payer: Self-pay | Admitting: Vascular Surgery

## 2015-05-17 LAB — GLUCOSE, CAPILLARY: Glucose-Capillary: 103 mg/dL — ABNORMAL HIGH (ref 65–99)

## 2015-05-18 ENCOUNTER — Other Ambulatory Visit: Payer: Self-pay | Admitting: Interventional Cardiology

## 2015-05-18 MED ORDER — EZETIMIBE 10 MG PO TABS: 10.0000 mg | ORAL_TABLET | Freq: Every day | ORAL | Status: DC

## 2015-05-23 ENCOUNTER — Ambulatory Visit: Payer: Medicare Other | Admitting: Podiatry

## 2015-06-20 ENCOUNTER — Ambulatory Visit: Payer: Medicare Other | Admitting: Podiatry

## 2015-07-11 ENCOUNTER — Ambulatory Visit: Payer: Medicare Other | Admitting: Podiatry

## 2015-07-31 ENCOUNTER — Encounter: Payer: Self-pay | Admitting: Family

## 2015-08-02 ENCOUNTER — Encounter (HOSPITAL_COMMUNITY): Payer: Medicare Other

## 2015-08-02 ENCOUNTER — Ambulatory Visit: Payer: Medicare Other | Admitting: Family

## 2015-08-13 ENCOUNTER — Encounter: Payer: Self-pay | Admitting: Nurse Practitioner

## 2015-08-13 ENCOUNTER — Ambulatory Visit (INDEPENDENT_AMBULATORY_CARE_PROVIDER_SITE_OTHER): Payer: Medicare Other | Admitting: Nurse Practitioner

## 2015-08-13 VITALS — BP 155/73 | HR 91

## 2015-08-13 DIAGNOSIS — R269 Unspecified abnormalities of gait and mobility: Secondary | ICD-10-CM | POA: Diagnosis not present

## 2015-08-13 DIAGNOSIS — F068 Other specified mental disorders due to known physiological condition: Secondary | ICD-10-CM | POA: Diagnosis not present

## 2015-08-13 DIAGNOSIS — F0390 Unspecified dementia without behavioral disturbance: Secondary | ICD-10-CM

## 2015-08-13 HISTORY — DX: Unspecified abnormalities of gait and mobility: R26.9

## 2015-08-13 MED ORDER — MEMANTINE HCL 10 MG PO TABS: 10.0000 mg | ORAL_TABLET | Freq: Two times a day (BID) | ORAL | Status: DC

## 2015-08-13 NOTE — Patient Instructions (Signed)
Continue Namenda twice daily will refill Use 4 prong cane at all times for ambulation Follow-up in 6 months

## 2015-08-13 NOTE — Progress Notes (Signed)
[facility]  PATIENT: [name] [name] DOB: June 30, 1934   REASON FOR VISIT: Follow-up for dementia HISTORY FROM: Patient and daughter    HISTORY OF PRESENT ILLNESS:CMMs. Borsa, 81 year old female returns for follow-up. She has a history of dementia and is currently on Namenda twice daily 10 mg. She was last seen in this office 02/08/2015. She continues to live at home she has help from family members. Her daughter fixes her meds for the week, granddaughter does the laundry and grocery shopping.She no longer drives.she performs her ADLs but not her finances. She denies any hallucinations, she denies any difficulty sleeping. She returns for reevaluation. She feels her memory is about the same    08/02/14 CDThis patient had undergone a shoulder replacement in January 2015 and went again to Rehab at [facility] for a 2 month period, followed by out patient therapy until last week.  [name] has followed her for eye-care, and she reported her right eye remained irritated and itching. Her eyes are very dry. She has used Restasis for several years, but stopped because of the price. She is on Lumigan eye drops still. She is conversant and pleasant. The patient was not longer aware that she had gotten lost several times. The daughter reminded her that these events truly took place. She returned home and in September was seen by her cardiologist for a possible TIA , numbness of the left arm- she presented to urgent care first ( Dr. Michail Sermon is PCP) . In the past she had a transient left facial droop. She had carotid doppler study with Dr. Irish Lack in September, those were less than 70% , Dr Early agreed and suggested q 6 month follow up , left Carotid stenosis of 65% and 45 % on the right.  She has completely recovered. Given her chronic a fib on ASA, I would not like to add any anticoagulation- she is at high fall risk.  MOCA today 08-02-14 at only 21 -30 points, not confused,  but more delayed in her responses.  She lost vision since her last retinal detachment which may account for some of the lower score. She forgot all 5 recall words.  She feels not depressed. She cries sometimes with sad movies, but this is not affect incontinence.  History : CD [name] has been an established patient since 2011 in the [facility] practice. She was initially referred here for mild memory loss and hallucinations.  The hallucinations began after a hip replacement in March 2011, after which she moved to rehabilitation at [facility]. She had experienced a noticeable difference in her handwriting and a left facial droop but a CT scan was negative for stroke. In August 2013 she left [employer], and then returned home and now again lives alone.  Her Mini-Mental Status Examination was 29 of 30 points or more, was reaching last time a 30 points AFT was 10 points. Fall risk was 10 points in 2013 . In December 2013 to return for follow up with another MMSE., which documented 26/30 , followed by [facility] testing at 16/30 points. I repeated the [place] at 13/30 points. Her fall risk was increased to 15 points, GDS (geriatric depression score) endorsed at 5 points. The patient underwent another eye surgery on May 8th of this year. She had decreasing visual acuity , her right eye remained red, puffy and it burned. She has gotten confused when walking to the local bank, has been getting lost on familiar roads and she is  not longer permitted to drive.  The 2011 hallucinations were described as " a strong sense that a snake was in the room " ,that there were snakes in her house- she also has heard a hissing sounds and rales and feared that the snake was in her room.. She called the police feel that somebody would break into a home,13 times- and as she recalled did not get a warm response. With the beginning of treatment with Seroquel , her hallucinations resolved. She still reports vivid  and at times crazy dreams. REM BD.  There is no documented nocturnal activity for the last 12 months, but the patient also lives along and has no witnesses.     REVIEW OF SYSTEMS: Full 14 system review of systems performed and notable only for those listed, all others are neg:  Constitutional: Light sensitivity  Cardiovascular: neg Ear/Nose/Throat: neg  Skin: neg Eyes: neg Respiratory: Cough Gastroitestinal: Urinary frequency  Hematology/Lymphatic: neg  Endocrine: neg Musculoskeletal:neg Allergy/Immunology: neg Neurological: Memory loss Psychiatric: neg Sleep : neg   ALLERGIES: Allergies  Allergen Reactions  . Vicodin [Hydrocodone-Acetaminophen] Other (See Comments)    Hallucinations - do NOT verify Vicodin orders  . Polysaccharide Iron Complex Hives, Itching and Rash    Patient can tolerate Ferrous Sulfate  . Amitriptyline     hallucinations  . Vesicare [Solifenacin]     hallucinations    HOME MEDICATIONS: Outpatient Prescriptions Prior to Visit  Medication Sig Dispense Refill  . acetaminophen (TYLENOL) 500 MG tablet Take 500-1,000 mg by mouth daily as needed (for arthritis pain).    [name] aspirin 81 MG tablet Take 81 mg by mouth daily.    . cholecalciferol (VITAMIN D) 1000 UNITS tablet Take 1,000 Units by mouth See admin instructions. On Monday, Tuesday, Wednesday, Friday, Saturday.    . COMBIGAN 0.2-0.5 % ophthalmic solution Place 1-2 drops into both eyes daily.   0  . ergocalciferol (VITAMIN D2) 50000 UNITS capsule Take 50,000 Units by mouth 2 (two) times a week. On Thursday and Sunday    . ezetimibe (ZETIA) 10 MG tablet Take 1 tablet (10 mg total) by mouth daily. 30 tablet 6  . felodipine (PLENDIL) 10 MG 24 hr tablet Take two tablets by mouth daily. (Patient taking differently: Take 10 mg by mouth daily. [name]) 60 tablet 11  . FLUoxetine (PROZAC) 20 MG capsule Take 20 mg by mouth every morning.     [name] glipiZIDE (GLUCOTROL XL) 5 MG 24 hr tablet Take 5 mg by mouth at  bedtime.     . Insulin Glargine (LANTUS SOLOSTAR) 100 UNIT/ML SOPN Inject 10 Units into the skin at bedtime.    [name] levothyroxine (SYNTHROID, LEVOTHROID) 125 MCG tablet Take 125 mcg by mouth daily before breakfast.     . metFORMIN (GLUCOPHAGE) 500 MG tablet Take 500 mg by mouth 2 (two) times daily with a meal.    . metoprolol tartrate (LOPRESSOR) 25 MG tablet Take 25 mg by mouth 2 (two) times daily.      . mirabegron ER (MYRBETRIQ) 50 MG TB24 Take 50 mg by mouth every evening.    [name] omeprazole (PRILOSEC) 20 MG capsule Take 20 mg by mouth daily.    . pioglitazone (ACTOS) 30 MG tablet Take 30 mg by mouth every morning.     . pravastatin (PRAVACHOL) 80 MG tablet 1 tablet daily 90 tablet 2  . memantine (NAMENDA) 10 MG tablet Take 1 tablet (10 mg total) by mouth 2 (two) times daily. 60 tablet 6  .  traMADol (ULTRAM) 50 MG tablet Take 50 mg by mouth every 6 (six) hours as needed for moderate pain.     No facility-administered medications prior to visit.    PAST MEDICAL HISTORY: Past Medical History  Diagnosis Date  . Hypertension   . Depression   . CAD (coronary artery disease)   . GERD (gastroesophageal reflux disease)   . Bilateral carotid artery stenosis   . Diverticulitis   . Obesity   . Degenerative joint disease   . Hiatal hernia   . Atrial fibrillation   . Hypothyroidism   . Type II diabetes mellitus   . Anemia   . Stroke 2011    "old very small TIA/neurologist"  . Chronic lower back pain     "back is very stiff because of fusion; can't stand up straight more than few moments"  . Complication of anesthesia     anesth. meds caused hallucinations   . Anxiety   . Endometriosis   . Fall at home 05/15/2013  . Progressive dementia with uncertain etiology [id number]    Suspect Lewy body .  [name] Bowel incontinence   . HLD (hyperlipidemia)   . Unspecified glaucoma   . Reflux esophagitis   . Hypertonicity of bladder   . Osteoarthrosis, unspecified whether generalized or localized,  unspecified site   . Lumbago   . Muscle weakness (generalized)   . Closed fracture of unspecified part of neck of femur   . Female stress incontinence   . Depressive disorder, not elsewhere classified   . Hx of echocardiogram     Echo (11/15):  Mod LVH, severe septal hypertrophy (suggestive HCM), no LVOT obstruction, rest gradient 15 mmHg across AV, vigorous LVF, EF 65-70%, no RWMA, Gr 1 DD, Ao sclerosis (no stenosis), mod LAE, mild RAE, mod TR, PASP 42 mmHg  . Hx of cardiovascular stress test     Lexiscan Myoview (11/15):  Low risk stress nuclear study with a small, mild fixed apical septal perfusion defect. EF is 52% with mild septal hypokinesis. No ischemia.  . Hypertrophic cardiomyopathy     PAST SURGICAL HISTORY: Past Surgical History  Procedure Laterality Date  . Ptca  03/10/12    LLE  . Coronary artery bypass graft  04/17/2011    LT  internal mammary artery to left anterior descending, saphenous vein graft first diagonal, saphenous  vein graft to obtuse marginal 1, saphenous vein graft to posterior descending  . Cholecystectomy    . Posterior fusion lumbar spine      "removed L4-5, S1"  . Total knee arthroplasty  ~ 2004    right  . Partial hip arthroplasty  11/2009    left; "just the ball"  . Cataract extraction w/ intraocular lens  implant, bilateral    . Neuroplasty / transposition median nerve at carpal tunnel bilateral    . Hand surgery      "for arthritis; right hand"  . Joint replacement    . Dilation and curettage of uterus      "for endometrosis"  . Scleral buckle Right 01/20/2013    Procedure: SCLERAL BUCKLE;  Surgeon: Hayden Pedro, MD;  Location: [place];  Service: Ophthalmology;  Laterality: Right;  . Laser photo ablation Right 01/20/2013    Procedure: LASER PHOTO ABLATION;  Surgeon: Hayden Pedro, MD;  Location: [place];  Service: Ophthalmology;  Laterality: Right;  . Back surgery      X2  . Carpal tunnel release Bilateral   . Dilation and curettage of  uterus        ENDOMETRIOSIS  . Total shoulder arthroplasty Right 10/07/2013    Procedure: TOTAL SHOULDER ARTHROPLASTY;  Surgeon: Alta Corning, MD;  Location: [place];  Service: Orthopedics;  Laterality: Right;  . Lower extremity angiogram N/A 03/10/2012    Procedure: LOWER EXTREMITY ANGIOGRAM;  Surgeon: Jettie Booze, MD;  Location: [facility] CATH LAB;  Service: Cardiovascular;  Laterality: N/A;  . Retinal detachment surgery Right May 2014  . Peripheral vascular catheterization N/A 05/16/2015    Procedure: Abdominal Aortogram;  Surgeon: Elam Dutch, MD;  Location: [place] CV LAB;  Service: Cardiovascular;  Laterality: N/A;  . Peripheral vascular catheterization Left 05/16/2015    Procedure: Lower Extremity Angiography;  Surgeon: Elam Dutch, MD;  Location: [place] CV LAB;  Service: Cardiovascular;  Laterality: Left;    FAMILY HISTORY: Family History  Problem Relation Age of Onset  . Cancer Paternal Aunt   . Cancer Cousin   . Heart attack Mother   . Hypertension Mother   . Heart attack Father   . Heart disease Father     Before age 1  . Hyperlipidemia Father   . Hypertension Father     SOCIAL HISTORY: Social History   Social History  . Marital Status: Divorced    Spouse Name: N/A  . Number of Children: 2  . Years of Education: COLLEGE   Occupational History  .      retired Pharmacist, hospital   Social History Main Topics  . Smoking status: Former Smoker -- 1.50 packs/day    Types: Cigarettes    Quit date: 09/16/1987  . Smokeless tobacco: Never Used  . Alcohol Use: No  . Drug Use: No  . Sexual Activity: No   Other Topics Concern  . Not on file   Social History Narrative   Patient is divorced and lives alone.   Patient has two adult children.   Patient is a retired Pharmacist, hospital.   Patient has a college education.   Patient is left-handed.   Patient does not drink any caffeine.     PHYSICAL EXAM  Filed Vitals:   08/13/15 1318  BP: 155/73  Pulse: 91   There is no  weight on file to calculate BMI. Generalized: Well developed, in no acute distress  Head: normocephalic and atraumatic,. Oropharynx benign  Neck: Supple, no carotid bruits  Cardiac: Regular rate rhythm, no murmur  Musculoskeletal: No deformity   Neurological examination   Mentation: Alert oriented to time, place, history taking. MMSE 26/30 AFT 10. Clock drawing 4/4. At last visit [place] 21/30 same as previous. Follows all commands speech and language fluent.   Cranial nerve II-XII: Pupils were equal round reactive to light extraocular movements were full, visual field were full on confrontational test. Mild left facial droop. Hearing was intact to finger rubbing bilaterally. Uvula tongue midline. head turning and shoulder shrug were normal and symmetric.Tongue protrusion into cheek strength was normal. Motor: normal bulk and tone, full strength in the BUE, BLE, fine finger movements normal, no pronator drift. No focal weakness Coordination: finger-nose-finger, heel-to-shin bilaterally, no dysmetria Reflexes: symmetric upper and lower plantar responses were flexor bilaterally. Gait and Station: Rising up from seated position with assistance, wide-based stance, Leans to the left, ambulates with a 4-point cane.  DIAGNOSTIC DATA (LABS, IMAGING, TESTING) - I reviewed patient records, labs, notes, testing and imaging myself where available.  Lab Results  Component Value Date   WBC 7.7 05/05/2015   HGB 11.9* 05/16/2015  HCT 35.0* 05/16/2015   MCV 101.2* 05/05/2015   PLT 177 05/05/2015      Component Value Date/Time   NA 142 05/16/2015 1211   K 4.4 05/16/2015 1211   CL 105 05/16/2015 1211   CO2 27 05/05/2015 1144   GLUCOSE 133* 05/16/2015 1211   BUN 30* 05/16/2015 1211   CREATININE 1.00 05/16/2015 1211   CALCIUM 9.4 05/05/2015 1144   PROT 6.5 05/05/2015 1144   ALBUMIN 3.6 05/05/2015 1144   AST 28 05/05/2015 1144   ALT 25 05/05/2015 1144   ALKPHOS 70 05/05/2015 1144   BILITOT  0.6 05/05/2015 1144   GFRNONAA 20* 05/05/2015 1144   GFRAA 51* 05/05/2015 1144     ASSESSMENT AND PLAN  80 y.o. year old female  has a past medical history of dementia and gait abnormality here to follow-up. Patient and daughter feel memory is stable  Continue Namenda twice daily will refill Use 4 prong cane at all times for ambulation due to gait abnormality and risk for falls Follow-up in 6 months, next with dr. Brett Fairy Dennie Bible, [facility], [facility], [facility] [address], [place], [place] [address] [phone]

## 2015-08-14 NOTE — Progress Notes (Signed)
I agree with the assessment and plan as directed by NP .The patient is known to me .   Marissia Blackham, MD  

## 2015-08-15 ENCOUNTER — Encounter: Payer: Self-pay | Admitting: Family

## 2015-08-20 ENCOUNTER — Ambulatory Visit (INDEPENDENT_AMBULATORY_CARE_PROVIDER_SITE_OTHER): Payer: Medicare Other | Admitting: Family

## 2015-08-20 ENCOUNTER — Encounter: Payer: Self-pay | Admitting: Family

## 2015-08-20 ENCOUNTER — Ambulatory Visit (INDEPENDENT_AMBULATORY_CARE_PROVIDER_SITE_OTHER)
Admission: RE | Admit: 2015-08-20 | Discharge: 2015-08-20 | Disposition: A | Discharge status: Home / Self Care | Payer: Medicare Other | Source: Ambulatory Visit | Attending: Family | Admitting: Family

## 2015-08-20 ENCOUNTER — Ambulatory Visit (HOSPITAL_COMMUNITY)
Admission: RE | Admit: 2015-08-20 | Discharge: 2015-08-20 | Disposition: A | Discharge status: Home / Self Care | Payer: Medicare Other | Source: Ambulatory Visit | Attending: Family | Admitting: Family

## 2015-08-20 VITALS — BP 133/65 | HR 74 | Ht 60.0 in | Wt 190.0 lb

## 2015-08-20 DIAGNOSIS — I10 Essential (primary) hypertension: Secondary | ICD-10-CM | POA: Diagnosis not present

## 2015-08-20 DIAGNOSIS — I739 Peripheral vascular disease, unspecified: Secondary | ICD-10-CM | POA: Diagnosis not present

## 2015-08-20 DIAGNOSIS — Z87891 Personal history of nicotine dependence: Secondary | ICD-10-CM | POA: Diagnosis not present

## 2015-08-20 DIAGNOSIS — I6523 Occlusion and stenosis of bilateral carotid arteries: Secondary | ICD-10-CM

## 2015-08-20 DIAGNOSIS — E785 Hyperlipidemia, unspecified: Secondary | ICD-10-CM | POA: Insufficient documentation

## 2015-08-20 DIAGNOSIS — E119 Type 2 diabetes mellitus without complications: Secondary | ICD-10-CM | POA: Insufficient documentation

## 2015-08-20 NOTE — Patient Instructions (Signed)
Stroke Prevention Some medical conditions and behaviors are associated with an increased chance of having a stroke. You may prevent a stroke by making healthy choices and managing medical conditions. HOW CAN I REDUCE MY RISK OF HAVING A STROKE?   Stay physically active. Get at least 30 minutes of activity on most or all days.  Do not smoke. It may also be helpful to avoid exposure to secondhand smoke.  Limit alcohol use. Moderate alcohol use is considered to be:  No more than 2 drinks per day for men.  No more than 1 drink per day for nonpregnant women.  Eat healthy foods. This involves:  Eating 5 or more servings of fruits and vegetables a day.  Making dietary changes that address high blood pressure (hypertension), high cholesterol, diabetes, or obesity.  Manage your cholesterol levels.  Making food choices that are high in fiber and low in saturated fat, trans fat, and cholesterol may control cholesterol levels.  Take any prescribed medicines to control cholesterol as directed by your health care provider.  Manage your diabetes.  Controlling your carbohydrate and sugar intake is recommended to manage diabetes.  Take any prescribed medicines to control diabetes as directed by your health care provider.  Control your hypertension.  Making food choices that are low in salt (sodium), saturated fat, trans fat, and cholesterol is recommended to manage hypertension.  Ask your health care provider if you need treatment to lower your blood pressure. Take any prescribed medicines to control hypertension as directed by your health care provider.  If you are 18-39 years of age, have your blood pressure checked every 3-5 years. If you are 40 years of age or older, have your blood pressure checked every year.  Maintain a healthy weight.  Reducing calorie intake and making food choices that are low in sodium, saturated fat, trans fat, and cholesterol are recommended to manage  weight.  Stop drug abuse.  Avoid taking birth control pills.  Talk to your health care provider about the risks of taking birth control pills if you are over 35 years old, smoke, get migraines, or have ever had a blood clot.  Get evaluated for sleep disorders (sleep apnea).  Talk to your health care provider about getting a sleep evaluation if you snore a lot or have excessive sleepiness.  Take medicines only as directed by your health care provider.  For some people, aspirin or blood thinners (anticoagulants) are helpful in reducing the risk of forming abnormal blood clots that can lead to stroke. If you have the irregular heart rhythm of atrial fibrillation, you should be on a blood thinner unless there is a good reason you cannot take them.  Understand all your medicine instructions.  Make sure that other conditions (such as anemia or atherosclerosis) are addressed. SEEK IMMEDIATE MEDICAL CARE IF:   You have sudden weakness or numbness of the face, arm, or leg, especially on one side of the body.  Your face or eyelid droops to one side.  You have sudden confusion.  You have trouble speaking (aphasia) or understanding.  You have sudden trouble seeing in one or both eyes.  You have sudden trouble walking.  You have dizziness.  You have a loss of balance or coordination.  You have a sudden, severe headache with no known cause.  You have new chest pain or an irregular heartbeat. Any of these symptoms may represent a serious problem that is an emergency. Do not wait to see if the symptoms will   go away. Get medical help at once. Call your local emergency services (911 in [place] Do not drive yourself to the hospital.   This information is not intended to replace advice given to you by your health care provider. Make sure you discuss any questions you have with your health care provider.   Document Released: 10/09/2004 Document Revised: 09/22/2014 Document Reviewed:  03/04/2013 [employer] Interactive Patient Education 2016 [employer]    Peripheral Vascular Disease Peripheral vascular disease (PVD) is a disease of the blood vessels that are not part of your heart and brain. A simple term for PVD is poor circulation. In most cases, PVD narrows the blood vessels that carry blood from your heart to the rest of your body. This can result in a decreased supply of blood to your arms, legs, and internal organs, like your stomach or kidneys. However, it most often affects a person's lower legs and feet. There are two types of PVD.  Organic PVD. This is the more common type. It is caused by damage to the structure of blood vessels.  Functional PVD. This is caused by conditions that make blood vessels contract and tighten (spasm). Without treatment, PVD tends to get worse over time. PVD can also lead to acute ischemic limb. This is when an arm or limb suddenly has trouble getting enough blood. This is a medical emergency. CAUSES Each type of PVD has many different causes. The most common cause of PVD is buildup of a fatty material (plaque) inside of your arteries (atherosclerosis). Small amounts of plaque can break off from the walls of the blood vessels and become lodged in a smaller artery. This blocks blood flow and can cause acute ischemic limb. Other common causes of PVD include:  Blood clots that form inside of blood vessels.  Injuries to blood vessels.  Diseases that cause inflammation of blood vessels or cause blood vessel spasms.  Health behaviors and health history that increase your risk of developing PVD. RISK FACTORS  You may have a greater risk of PVD if you:  Have a family history of PVD.  Have certain medical conditions, including:  High cholesterol.  Diabetes.  High blood pressure (hypertension).  Coronary heart disease.  Past problems with blood clots.  Past injury, such as burns or a broken bone. These may have damaged blood  vessels in your limbs.  Buerger disease. This is caused by inflamed blood vessels in your hands and feet.  Some forms of arthritis.  Rare birth defects that affect the arteries in your legs.  Use tobacco.  Do not get enough exercise.  Are obese.  Are age 50 or older. SIGNS AND SYMPTOMS  PVD may cause many different symptoms. Your symptoms depend on what part of your body is not getting enough blood. Some common signs and symptoms include:  Cramps in your lower legs. This may be a symptom of poor leg circulation (claudication).  Pain and weakness in your legs while you are physically active that goes away when you rest (intermittent claudication).  Leg pain when at rest.  Leg numbness, tingling, or weakness.  Coldness in a leg or foot, especially when compared with the other leg.  Skin or hair changes. These can include:  Hair loss.  Shiny skin.  Pale or bluish skin.  Thick toenails.  Inability to get or maintain an erection (erectile dysfunction). People with PVD are more prone to developing ulcers and sores on their toes, feet, or legs. These may take longer than   normal to heal. DIAGNOSIS Your health care provider may diagnose PVD from your signs and symptoms. The health care provider will also do a physical exam. You may have tests to find out what is causing your PVD and determine its severity. Tests may include:  Blood pressure recordings from your arms and legs and measurements of the strength of your pulses (pulse volume recordings).  Imaging studies using sound waves to take pictures of the blood flow through your blood vessels (Doppler ultrasound).  Injecting a dye into your blood vessels before having imaging studies using:  X-rays (angiogram or arteriogram).  Computer-generated X-rays (CT angiogram).  A powerful electromagnetic field and a computer (magnetic resonance angiogram or MRA). TREATMENT Treatment for PVD depends on the cause of your condition  and the severity of your symptoms. It also depends on your age. Underlying causes need to be treated and controlled. These include long-lasting (chronic) conditions, such as diabetes, high cholesterol, and high blood pressure. You may need to first try making lifestyle changes and taking medicines. Surgery may be needed if these do not work. Lifestyle changes may include:  Quitting smoking.  Exercising regularly.  Following a low-fat, low-cholesterol diet. Medicines may include:  Blood thinners to prevent blood clots.  Medicines to improve blood flow.  Medicines to improve your blood cholesterol levels. Surgical procedures may include:  A procedure that uses an inflated balloon to open a blocked artery and improve blood flow (angioplasty).  A procedure to put in a tube (stent) to keep a blocked artery open (stent implant).  Surgery to reroute blood flow around a blocked artery (peripheral bypass surgery).  Surgery to remove dead tissue from an infected wound on the affected limb.  Amputation. This is surgical removal of the affected limb. This may be necessary in cases of acute ischemic limb that are not improved through medical or surgical treatments. HOME CARE INSTRUCTIONS  Take medicines only as directed by your health care provider.  Do not use any tobacco products, including cigarettes, chewing tobacco, or electronic cigarettes. If you need help quitting, ask your health care provider.  Lose weight if you are overweight, and maintain a healthy weight as directed by your health care provider.  Eat a diet that is low in fat and cholesterol. If you need help, ask your health care provider.  Exercise regularly. Ask your health care provider to suggest some good activities for you.  Use compression stockings or other mechanical devices as directed by your health care provider.  Take good care of your feet.  Wear comfortable shoes that fit well.  Check your feet often for  any cuts or sores. SEEK MEDICAL CARE IF:  You have cramps in your legs while walking.  You have leg pain when you are at rest.  You have coldness in a leg or foot.  Your skin changes.  You have erectile dysfunction.  You have cuts or sores on your feet that are not healing. SEEK IMMEDIATE MEDICAL CARE IF:  Your arm or leg turns cold and blue.  Your arms or legs become red, warm, swollen, painful, or numb.  You have chest pain or trouble breathing.  You suddenly have weakness in your face, arm, or leg.  You become very confused or lose the ability to speak.  You suddenly have a very bad headache or lose your vision.   This information is not intended to replace advice given to you by your health care provider. Make sure you discuss any questions   you have with your health care provider.   Document Released: 10/09/2004 Document Revised: 09/22/2014 Document Reviewed: 02/09/2014 [employer] Interactive Patient Education 2016 [employer]  

## 2015-08-20 NOTE — Progress Notes (Signed)
[facility] [place] HISTORY AND PHYSICAL   MRN : [id number]  History of Present Illness:   [name] [name] is a 81 y.o. female patient of Dr. Donnetta Hutching who has been followed for known extracranial cerebrovascular occlusive disease.  She was lost to follow up from 2012 to 2015 until Dr. Donnetta Hutching saw her in 2015. She did undergo arteriogram by Dr. Oneida Alar on 05/16/15. Dr. Donnetta Hutching discussed this with the patient and also with her daughter. This showed superficial femoral artery occlusion of mid left thigh with reconstitution of a diseased popliteal artery and runoff via the anterior tibial artery into the foot. Dr. Donnetta Hutching indicates in his progress note that she does have adequate flow to not have limb threatening tissue loss. She will continue her follow-up with Dr. Amalia Hailey. Would require femoropopliteal bypass if she has progression of the foot lesion. Hopefully with protection of this area she will have a complete healing and not require bypass She returns today for follow up of her extracranial carotid artery disease and PAD.  She's had multiple issues since 2012. She has undergone coronary artery bypass grafting. She does have a diagnosis of dementia and has significant memory loss. She is able to communicate very easily regarding her issues. Several weeks prior to her November 2015 visit with Dr. Donnetta Hutching she had an episode where she had a fluttering sensation in her chest and then was noted to have numbness and aching in her left arm up to her left shoulder. This persisted for several hours and had resolved by the next morning. She does not recall any focal weakness. He denies any other deficits at that time. She's had no prior other problems since that time. Does have chronic atrial fibrillation. She does not walk much as she is afraid of falling, "I'm slow and I don't have a lot of energy".  Patient has not had previous carotid artery intervention.  The patient denies any other history  of TIA or stroke symptoms other than mentioned above, specifically the patient denies a history of amaurosis fugax or monocular blindness, denies a history unilateral of facial drooping, denies a history of hemiplegia, and denies a history of receptive or expressive aphasia.   She states she is afraid of falling.  The patient reports New Medical or Surgical History: right shoulder surgery in 2015 Dr. Irish Lack is her cardiologist  Pt Diabetic: Yes, Review of records: A1C was 6.1 in March 2016 Pt smoker: former smoker, quit in 1989  Pt meds include: Statin : Yes ASA: Yes Other anticoagulants/antiplatelets: no     Current Outpatient Prescriptions  Medication Sig Dispense Refill  . acetaminophen (TYLENOL) 500 MG tablet Take 500-1,000 mg by mouth daily as needed (for arthritis pain).    [name] aspirin 81 MG tablet Take 81 mg by mouth daily.    . cholecalciferol (VITAMIN D) 1000 UNITS tablet Take 1,000 Units by mouth See admin instructions. On Monday, Tuesday, Wednesday, Friday, Saturday.    . COMBIGAN 0.2-0.5 % ophthalmic solution Place 1-2 drops into both eyes daily.   0  . ergocalciferol (VITAMIN D2) 50000 UNITS capsule Take 50,000 Units by mouth 2 (two) times a week. On Thursday and Sunday    . ezetimibe (ZETIA) 10 MG tablet Take 1 tablet (10 mg total) by mouth daily. 30 tablet 6  . felodipine (PLENDIL) 10 MG 24 hr tablet Take two tablets by mouth daily. (Patient taking differently: Take 10 mg by mouth daily. [name]) 60 tablet 11  . FLUoxetine (PROZAC)  20 MG capsule Take 20 mg by mouth every morning.     [name] glipiZIDE (GLUCOTROL XL) 5 MG 24 hr tablet Take 5 mg by mouth at bedtime.     . Insulin Glargine (LANTUS SOLOSTAR) 100 UNIT/ML SOPN Inject 10 Units into the skin at bedtime.    [name] levothyroxine (SYNTHROID, LEVOTHROID) 125 MCG tablet Take 125 mcg by mouth daily before breakfast.     . lisinopril (PRINIVIL,ZESTRIL) 5 MG tablet Take 5 mg by mouth daily.  1  . memantine (NAMENDA) 10 MG tablet Take  1 tablet (10 mg total) by mouth 2 (two) times daily. 60 tablet 6  . metFORMIN (GLUCOPHAGE) 500 MG tablet Take 500 mg by mouth 2 (two) times daily with a meal.    . metoprolol tartrate (LOPRESSOR) 25 MG tablet Take 25 mg by mouth 2 (two) times daily.      . mirabegron ER (MYRBETRIQ) 50 MG TB24 Take 50 mg by mouth every evening.    [name] omeprazole (PRILOSEC) 20 MG capsule Take 20 mg by mouth daily.    . pioglitazone (ACTOS) 30 MG tablet Take 30 mg by mouth every morning.     . pravastatin (PRAVACHOL) 80 MG tablet 1 tablet daily 90 tablet 2  . QUEtiapine (SEROQUEL) 25 MG tablet   0  . [DISCONTINUED] amiodarone (PACERONE) 200 MG tablet Take 200 mg by mouth 3 (three) times a week.      No current facility-administered medications for this visit.    Past Medical History  Diagnosis Date  . Hypertension   . Depression   . CAD (coronary artery disease)   . GERD (gastroesophageal reflux disease)   . Bilateral carotid artery stenosis   . Diverticulitis   . Obesity   . Degenerative joint disease   . Hiatal hernia   . Atrial fibrillation ([place])   . Hypothyroidism   . Type II diabetes mellitus ([place])   . Anemia   . Stroke [facility]) 2011    "old very small TIA/neurologist"  . Chronic lower back pain     "back is very stiff because of fusion; can't stand up straight more than few moments"  . Complication of anesthesia     anesth. meds caused hallucinations   . Anxiety   . Endometriosis   . Fall at home 05/15/2013  . Progressive dementia with uncertain etiology [id number]    Suspect Lewy body .  [name] Bowel incontinence   . HLD (hyperlipidemia)   . Unspecified glaucoma   . Reflux esophagitis   . Hypertonicity of bladder   . Osteoarthrosis, unspecified whether generalized or localized, unspecified site   . Lumbago   . Muscle weakness (generalized)   . Closed fracture of unspecified part of neck of femur   . Female stress incontinence   . Depressive disorder, not elsewhere classified   . Hx of  echocardiogram     Echo (11/15):  Mod LVH, severe septal hypertrophy (suggestive HCM), no LVOT obstruction, rest gradient 15 mmHg across AV, vigorous LVF, EF 65-70%, no RWMA, Gr 1 DD, Ao sclerosis (no stenosis), mod LAE, mild RAE, mod TR, PASP 42 mmHg  . Hx of cardiovascular stress test     Lexiscan Myoview (11/15):  Low risk stress nuclear study with a small, mild fixed apical septal perfusion defect. EF is 52% with mild septal hypokinesis. No ischemia.  . Hypertrophic cardiomyopathy [facility])     Social History Social History  Substance Use Topics  . Smoking status: Former Smoker -- 1.50 packs/day  Types: Cigarettes    Quit date: 09/16/1987  . Smokeless tobacco: Never Used  . Alcohol Use: No    Family History Family History  Problem Relation Age of Onset  . Cancer Paternal Aunt   . Cancer Cousin   . Heart attack Mother   . Hypertension Mother   . Heart attack Father   . Heart disease Father     Before age 24  . Hyperlipidemia Father   . Hypertension Father     Surgical History Past Surgical History  Procedure Laterality Date  . Ptca  03/10/12    LLE  . Coronary artery bypass graft  04/17/2011    LT  internal mammary artery to left anterior descending, saphenous vein graft first diagonal, saphenous  vein graft to obtuse marginal 1, saphenous vein graft to posterior descending  . Cholecystectomy    . Posterior fusion lumbar spine      "removed L4-5, S1"  . Total knee arthroplasty  ~ 2004    right  . Partial hip arthroplasty  11/2009    left; "just the ball"  . Cataract extraction w/ intraocular lens  implant, bilateral    . Neuroplasty / transposition median nerve at carpal tunnel bilateral    . Hand surgery      "for arthritis; right hand"  . Joint replacement    . Dilation and curettage of uterus      "for endometrosis"  . Scleral buckle Right 01/20/2013    Procedure: SCLERAL BUCKLE;  Surgeon: Hayden Pedro, MD;  Location: [facility];  Service: Ophthalmology;   Laterality: Right;  . Laser photo ablation Right 01/20/2013    Procedure: LASER PHOTO ABLATION;  Surgeon: Hayden Pedro, MD;  Location: [place];  Service: Ophthalmology;  Laterality: Right;  . Back surgery      X2  . Carpal tunnel release Bilateral   . Dilation and curettage of uterus      ENDOMETRIOSIS  . Total shoulder arthroplasty Right 10/07/2013    Procedure: TOTAL SHOULDER ARTHROPLASTY;  Surgeon: Alta Corning, MD;  Location: [facility];  Service: Orthopedics;  Laterality: Right;  . Lower extremity angiogram N/A 03/10/2012    Procedure: LOWER EXTREMITY ANGIOGRAM;  Surgeon: Jettie Booze, MD;  Location[facility] CATH LAB;  Service: Cardiovascular;  Laterality: N/A;  . Retinal detachment surgery Right May 2014  . Peripheral vascular catheterization N/A 05/16/2015    Procedure: Abdominal Aortogram;  Surgeon: Elam Dutch, MD;  Location: [place] CV LAB;  Service: Cardiovascular;  Laterality: N/A;  . Peripheral vascular catheterization Left 05/16/2015    Procedure: Lower Extremity Angiography;  Surgeon: Elam Dutch, MD;  Location: [place] CV LAB;  Service: Cardiovascular;  Laterality: Left;    Allergies  Allergen Reactions  . Vicodin [Hydrocodone-Acetaminophen] Other (See Comments)    Hallucinations - do NOT verify Vicodin orders  . Polysaccharide Iron Complex Hives, Itching and Rash    Patient can tolerate Ferrous Sulfate  . Amitriptyline     hallucinations  . Vesicare [Solifenacin]     hallucinations    Current Outpatient Prescriptions  Medication Sig Dispense Refill  . acetaminophen (TYLENOL) 500 MG tablet Take 500-1,000 mg by mouth daily as needed (for arthritis pain).    [name] aspirin 81 MG tablet Take 81 mg by mouth daily.    . cholecalciferol (VITAMIN D) 1000 UNITS tablet Take 1,000 Units by mouth See admin instructions. On Monday, Tuesday, Wednesday, Friday, Saturday.    . COMBIGAN 0.2-0.5 % ophthalmic solution  Place 1-2 drops into both eyes daily.   0  .  ergocalciferol (VITAMIN D2) 50000 UNITS capsule Take 50,000 Units by mouth 2 (two) times a week. On Thursday and Sunday    . ezetimibe (ZETIA) 10 MG tablet Take 1 tablet (10 mg total) by mouth daily. 30 tablet 6  . felodipine (PLENDIL) 10 MG 24 hr tablet Take two tablets by mouth daily. (Patient taking differently: Take 10 mg by mouth daily. [name]) 60 tablet 11  . FLUoxetine (PROZAC) 20 MG capsule Take 20 mg by mouth every morning.     [name] glipiZIDE (GLUCOTROL XL) 5 MG 24 hr tablet Take 5 mg by mouth at bedtime.     . Insulin Glargine (LANTUS SOLOSTAR) 100 UNIT/ML SOPN Inject 10 Units into the skin at bedtime.    [name] levothyroxine (SYNTHROID, LEVOTHROID) 125 MCG tablet Take 125 mcg by mouth daily before breakfast.     . lisinopril (PRINIVIL,ZESTRIL) 5 MG tablet Take 5 mg by mouth daily.  1  . memantine (NAMENDA) 10 MG tablet Take 1 tablet (10 mg total) by mouth 2 (two) times daily. 60 tablet 6  . metFORMIN (GLUCOPHAGE) 500 MG tablet Take 500 mg by mouth 2 (two) times daily with a meal.    . metoprolol tartrate (LOPRESSOR) 25 MG tablet Take 25 mg by mouth 2 (two) times daily.      . mirabegron ER (MYRBETRIQ) 50 MG TB24 Take 50 mg by mouth every evening.    [name] omeprazole (PRILOSEC) 20 MG capsule Take 20 mg by mouth daily.    . pioglitazone (ACTOS) 30 MG tablet Take 30 mg by mouth every morning.     . pravastatin (PRAVACHOL) 80 MG tablet 1 tablet daily 90 tablet 2  . QUEtiapine (SEROQUEL) 25 MG tablet   0  . [DISCONTINUED] amiodarone (PACERONE) 200 MG tablet Take 200 mg by mouth 3 (three) times a week.      No current facility-administered medications for this visit.     REVIEW OF SYSTEMS: See HPI for pertinent positives and negatives.  Physical Examination Filed Vitals:   08/20/15 1329 08/20/15 1335  BP: 142/62 133/65  Pulse: 74   Height: 5' (1.524 m)   Weight: 190 lb (86.183 kg)   SpO2: 94%    Body mass index is 37.11 kg/(m^2).   General: WDWN obese female in NAD GAIT: slow, deliberate,  using quad cane Eyes: PERRLA Pulmonary: Non-labored, CTAB, no rales,no rhonchi, & no wheezing.  Cardiac: Irregular rhythm, no detected murmur.  VASCULAR EXAM Carotid Bruits Right Left   Soft, + soft, +   Aorta is not palpable. Radial pulses are 2+ palpable and equal.      LE Pulses Right Left   FEMORAL  not palpable  not palpable    POPLITEAL not palpable  not palpable   POSTERIOR TIBIAL not palpable, monophasic by Doppler  not palpable, not detectable by Doppler    DORSALIS PEDIS  ANTERIOR TIBIAL not palpable, monophasic by Doppler  not palpable, monophasic by Doppler    PERONEAL non-Dopplerable   non-Dopplerable     Gastrointestinal: softly obese, nontender, BS WNL, no r/g, no palpable masses.  Musculoskeletal: No muscle atrophy/wasting, but seems generally deconditioned. M/S 4/5 throughout. No signs of ischemia in LE's.    Neurologic: A&O X 3; Appropriate Affect,  Speech is normal CN 2-12 intact, Pain and light touch intact in extremities, Motor exam as listed above.  Non-Invasive Vascular Imaging (08/20/2015):   Carotid Duplex: 60-79% right ICA stenosis. Velocities may be underestimated.  80-99% stenosis of the left ICA. Plaque is densely calcific and cannot be thoroughly evaluated. Disease progression bilaterally.   ABI (Date: 08/20/2015)  R: 0.84 (0.79, 05/14/15), DP: monophasic, PT: monophasic, TBI: 0.45  L: 0.45 (.42), DP: monophasic, PT: absent   ASSESSMENT:  [name] [name] is a 81 y.o. female who returns for routine follow up of her extracranial carotid artery disease and PAD. The patient has not had any symptoms of stroke or TIA in the last year or so. She is developing numbness in her feet that seems like DM  neuropathy. She does not seem to walk enough to elicit claudication symptoms. She does not walk much, it seems due to fear of falling.  She has no signs of ischemia in her feet/legs.  Her friend is with her today.  Today's carotid duplex suggests 60-79% right ICA stenosis. Velocities may be underestimated.  80-99% stenosis of the left ICA. Plaque is densely calcific and cannot be thoroughly evaluated.  Disease progression bilaterally.  ABI's remain stable with mild arterial occlusive disease in the right LE and severe in the left LE.    PLAN:   Based on today's exam and non-invasive vascular lab results, and after discussing with Dr. Trula Slade, the patient will be scheduled to see Dr. Donnetta Hutching ASAP to discuss possible left CEA.  I discussed in depth with the patient the nature of atherosclerosis, and emphasized the importance of maximal medical management including strict control of blood pressure, blood glucose, and lipid levels, obtaining regular exercise, and cessation of smoking.  The patient is aware that without maximal medical management the underlying atherosclerotic disease process will progress, limiting the benefit of any interventions. . The patient was given information about stroke prevention and what symptoms should prompt the patient to seek immediate medical care.  The patient was given information about PAD including signs, symptoms, treatment, what symptoms should prompt the patient to seek immediate medical care, and risk reduction measures to take. Thank you for allowing [place] to participate in this patient's care.  Clemon Chambers, RN, MSN, FNP-C [facility] Office: [phone]  Clinic MD: Trula Slade  08/20/2015 12:58 PM

## 2015-08-21 ENCOUNTER — Ambulatory Visit: Payer: Medicare Other | Admitting: Family

## 2015-08-22 ENCOUNTER — Encounter: Payer: Self-pay | Admitting: Vascular Surgery

## 2015-08-28 ENCOUNTER — Encounter: Payer: Self-pay | Admitting: Vascular Surgery

## 2015-08-28 ENCOUNTER — Ambulatory Visit (INDEPENDENT_AMBULATORY_CARE_PROVIDER_SITE_OTHER): Payer: Medicare Other | Admitting: Vascular Surgery

## 2015-08-28 VITALS — BP 100/51 | HR 71 | Ht 60.0 in | Wt 192.9 lb

## 2015-08-28 DIAGNOSIS — I6523 Occlusion and stenosis of bilateral carotid arteries: Secondary | ICD-10-CM | POA: Diagnosis not present

## 2015-08-28 NOTE — Progress Notes (Signed)
Patient name: [name] [name] MRN: [id number] DOB: 1933-12-07 Sex: female  REASON FOR VISIT:  Discussion of asymptomatic left carotid stenosis  HPI: [name] [name] is a 81 y.o. female  Here today with her daughter for further discussion of asymptomatic carotid stenosis. She was seen last week in routine protocol follow-up. She had had moderate to severe stenosis which was being followed noninvasive lab. Her recent duplex last week showed significant progression in her left internal carotid to greater than 80% stenosis. She has had no focal neurologic deficits. She does have mild dementia but lives independently and has some memory loss. She is quite alert and talkative can answer questions appropriately.  Current Outpatient Prescriptions  Medication Sig Dispense Refill  . acetaminophen (TYLENOL) 500 MG tablet Take 500-1,000 mg by mouth daily as needed (for arthritis pain).    . Ascorbic Acid (VITAMIN C) 1000 MG tablet Take 1,000 mg by mouth daily.    [name] aspirin 81 MG tablet Take 81 mg by mouth daily.    . cholecalciferol (VITAMIN D) 1000 UNITS tablet Take 1,000 Units by mouth See admin instructions. On Monday, Tuesday, Wednesday, Friday, Saturday.    . COMBIGAN 0.2-0.5 % ophthalmic solution Place 1-2 drops into both eyes daily.   0  . ergocalciferol (VITAMIN D2) 50000 UNITS capsule Take 50,000 Units by mouth 2 (two) times a week. On Thursday and Sunday    . ezetimibe (ZETIA) 10 MG tablet Take 1 tablet (10 mg total) by mouth daily. 30 tablet 6  . felodipine (PLENDIL) 10 MG 24 hr tablet Take two tablets by mouth daily. (Patient taking differently: Take 10 mg by mouth daily. [name]) 60 tablet 11  . FLUoxetine (PROZAC) 20 MG capsule Take 20 mg by mouth every morning.     [name] glipiZIDE (GLUCOTROL XL) 5 MG 24 hr tablet Take 5 mg by mouth at bedtime.     . Insulin Glargine (LANTUS SOLOSTAR) 100 UNIT/ML SOPN Inject 10 Units into the skin at bedtime.    [name] levothyroxine (SYNTHROID, LEVOTHROID) 125 MCG tablet Take  125 mcg by mouth daily before breakfast.     . lisinopril (PRINIVIL,ZESTRIL) 5 MG tablet Take 5 mg by mouth daily.  1  . memantine (NAMENDA) 10 MG tablet Take 1 tablet (10 mg total) by mouth 2 (two) times daily. 60 tablet 6  . metFORMIN (GLUCOPHAGE) 500 MG tablet Take 500 mg by mouth 2 (two) times daily with a meal.    . metoprolol tartrate (LOPRESSOR) 25 MG tablet Take 25 mg by mouth 2 (two) times daily.      . mirabegron ER (MYRBETRIQ) 50 MG TB24 Take 50 mg by mouth every evening.    [name] omeprazole (PRILOSEC) 20 MG capsule Take 20 mg by mouth daily.    . pioglitazone (ACTOS) 30 MG tablet Take 30 mg by mouth every morning.     . pravastatin (PRAVACHOL) 80 MG tablet 1 tablet daily 90 tablet 2  . prednisoLONE sodium phosphate (INFLAMASE FORTE) 1 % ophthalmic solution Place 1 drop into the right eye 3 (three) times daily.    . QUEtiapine (SEROQUEL) 25 MG tablet   0  . traMADol (ULTRAM) 50 MG tablet Take 50 mg by mouth every 6 (six) hours as needed.    . [DISCONTINUED] amiodarone (PACERONE) 200 MG tablet Take 200 mg by mouth 3 (three) times a week.      No current facility-administered medications for this visit.    REVIEW OF SYSTEMS:  [X]  denotes positive finding, [ ]   denotes negative finding Cardiac  Comments:  Chest pain or chest pressure:    Shortness of breath upon exertion:    Short of breath when lying flat:    Irregular heart rhythm:    Constitutional    Fever or chills:      PHYSICAL EXAM: Filed Vitals:   08/28/15 1538 08/28/15 1544  BP: 115/55 100/51  Pulse: 71   Height: 5' (1.524 m)   Weight: 192 lb 14.4 oz (87.499 kg)   SpO2: 96%     GENERAL: The patient is a well-nourished female, in no acute distress. The vital signs are documented above. CARDIOVASCULAR:  Carotid arteries without bruits bilaterally PULMONARY: There is good air exchange bilaterally without wheezing or rales.  2+ radial pulses bilaterally.  MEDICAL ISSUES:  had a very long discussion with patient  and her daughter present. Explained degree of stenosis at surgery approximate 5% per year risk of neurologic deficit either stroke or transient ischemic attack related to her severe left carotid stenosis. I did explain her risk for elective endarterectomy for asymptomatic disease in approximately 1.5% risk of stroke with surgery. She has been scheduled to see cardiology for risk stratification regarding surgery. I explained that this be very unlikely that she would not be able to tolerate the endarterectomy. Daughter also discuss this with Dr. Brett Fairy who is assisting her regarding her dementia. We will discuss this further after these further evaluations. We will notify us immediately should she develop any new neurologic deficits  Jensyn Cambria [facility] [employer]: [phone]

## 2015-08-31 ENCOUNTER — Telehealth: Payer: Self-pay | Admitting: Interventional Cardiology

## 2015-08-31 NOTE — Telephone Encounter (Signed)
Notified Juliann Pulse at [facility] office that Dr. Irish Lack not in office today but will forward message to him.  Surgery (L) CEA has not been scheduled until clearance. Will forward message to Dr. Irish Lack and Chanetta Marshall, his nurse.

## 2015-08-31 NOTE — Telephone Encounter (Signed)
New message  Request for surgical clearance:  1. What type of surgery is being performed? Left CEA   2. When is this surgery scheduled? Not scheduled   3. Are there any medications that need to be held prior to surgery and how long? No restrictions   4. Name of physician performing surgery? Dr. Sherren Mocha Early   5. What is your office phone and fax number? Please send in EPIC. If you need to see the pt please just make an appt.

## 2015-09-03 NOTE — Telephone Encounter (Signed)
No further cardiac testing needed before CEA.

## 2015-09-04 NOTE — Telephone Encounter (Signed)
**Note De-Identified  Obfuscation** This phone note has been faxed through the EPIC system to Dr Jonette Mate: Juliann Pulse.

## 2015-09-05 ENCOUNTER — Telehealth: Payer: Self-pay | Admitting: Neurology

## 2015-09-05 ENCOUNTER — Other Ambulatory Visit: Payer: Self-pay

## 2015-09-05 NOTE — Telephone Encounter (Signed)
[name] called inquiring if Dr Brett Fairy had rec'd report for Dr Early for recommendation to do surgery on carotoid artery. Pt will have to be put to sleep and because she has dementia the daughter remembers Dr Brett Fairy had said she would not recommend her being put to sleep more than necessary. Daughter was advised she is not on DPR. She said she has POA and will fax papers to [facility]

## 2015-09-05 NOTE — Telephone Encounter (Signed)
We have not received any paper work for clearance for surgery on this pt. Will be on the look out for these papers. Cannot find a DPR on file.

## 2015-09-25 ENCOUNTER — Inpatient Hospital Stay (HOSPITAL_COMMUNITY): Admission: RE | Admit: 2015-09-25 | Payer: Medicare Other | Source: Ambulatory Visit

## 2015-09-27 ENCOUNTER — Telehealth: Payer: Self-pay | Admitting: Nurse Practitioner

## 2015-09-27 ENCOUNTER — Telehealth: Payer: Self-pay | Admitting: Neurology

## 2015-09-27 NOTE — Telephone Encounter (Signed)
Pt can try it I have not personally seen the commercial and am not aware of the product. Generally speaking healthy diet low in fat is recommended

## 2015-09-27 NOTE — Telephone Encounter (Signed)
[name] called inquiring if Cheryln Manly would be advisable to take for her dementia. Pt saw it advertised on TV and was inquiring if that would be helpful.

## 2015-09-28 ENCOUNTER — Encounter (HOSPITAL_COMMUNITY)
Admission: RE | Admit: 2015-09-28 | Discharge: 2015-09-28 | Disposition: A | Discharge status: Home / Self Care | Payer: Medicare Other | Source: Ambulatory Visit | Attending: Vascular Surgery | Admitting: Vascular Surgery

## 2015-09-28 ENCOUNTER — Other Ambulatory Visit (HOSPITAL_COMMUNITY): Payer: Medicare Other

## 2015-09-28 ENCOUNTER — Encounter (HOSPITAL_COMMUNITY): Payer: Self-pay

## 2015-09-28 HISTORY — DX: Headache: R51

## 2015-09-28 HISTORY — DX: Cough, unspecified: R05.9

## 2015-09-28 HISTORY — DX: Cough: R05

## 2015-09-28 HISTORY — DX: Headache, unspecified: R51.9

## 2015-09-28 LAB — URINALYSIS, ROUTINE W REFLEX MICROSCOPIC
BILIRUBIN URINE: NEGATIVE
GLUCOSE, UA: NEGATIVE mg/dL
Hgb urine dipstick: NEGATIVE
KETONES UR: NEGATIVE mg/dL
NITRITE: NEGATIVE
PH: 6 (ref 5.0–8.0)
Protein, ur: NEGATIVE mg/dL
Specific Gravity, Urine: 1.019 (ref 1.005–1.030)

## 2015-09-28 LAB — CBC
HEMATOCRIT: 38.7 % (ref 36.0–46.0)
HEMOGLOBIN: 12.5 g/dL (ref 12.0–15.0)
MCH: 32.2 pg (ref 26.0–34.0)
MCHC: 32.3 g/dL (ref 30.0–36.0)
MCV: 99.7 fL (ref 78.0–100.0)
Platelets: 212 10*3/uL (ref 150–400)
RBC: 3.88 MIL/uL (ref 3.87–5.11)
RDW: 14.2 % (ref 11.5–15.5)
WBC: 6.4 10*3/uL (ref 4.0–10.5)

## 2015-09-28 LAB — URINE MICROSCOPIC-ADD ON: RBC / HPF: NONE SEEN RBC/hpf (ref 0–5)

## 2015-09-28 LAB — PROTIME-INR
INR: 1.11 (ref 0.00–1.49)
PROTHROMBIN TIME: 14.5 s (ref 11.6–15.2)

## 2015-09-28 LAB — COMPREHENSIVE METABOLIC PANEL
ALBUMIN: 3.7 g/dL (ref 3.5–5.0)
ALT: 19 U/L (ref 14–54)
AST: 25 U/L (ref 15–41)
Alkaline Phosphatase: 67 U/L (ref 38–126)
Anion gap: 8 (ref 5–15)
BUN: 22 mg/dL — AB (ref 6–20)
CHLORIDE: 107 mmol/L (ref 101–111)
CO2: 28 mmol/L (ref 22–32)
Calcium: 9.5 mg/dL (ref 8.9–10.3)
Creatinine, Ser: 1.11 mg/dL — ABNORMAL HIGH (ref 0.44–1.00)
GFR calc Af Amer: 53 mL/min — ABNORMAL LOW (ref >60)
GFR, EST NON AFRICAN AMERICAN: 46 mL/min — AB (ref >60)
Glucose, Bld: 88 mg/dL (ref 65–99)
POTASSIUM: 4.5 mmol/L (ref 3.5–5.1)
SODIUM: 143 mmol/L (ref 135–145)
Total Bilirubin: 0.7 mg/dL (ref 0.3–1.2)
Total Protein: 6.6 g/dL (ref 6.5–8.1)

## 2015-09-28 LAB — SURGICAL PCR SCREEN
MRSA, PCR: POSITIVE — AB
Staphylococcus aureus: POSITIVE — AB

## 2015-09-28 LAB — APTT: aPTT: 33 seconds (ref 24–37)

## 2015-09-28 LAB — GLUCOSE, CAPILLARY: Glucose-Capillary: 108 mg/dL — ABNORMAL HIGH (ref 65–99)

## 2015-09-28 NOTE — Telephone Encounter (Signed)
I called and LMVM for [name], of that below.  She is to call back if questions.

## 2015-09-28 NOTE — Progress Notes (Signed)
Anesthesia Chart Review:  Pt is a 82 year old female scheduled for L CEA on 10/01/2015 with Dr. Donnetta Hutching.   PCP is Dr. Lajean Manes. Cardiologist is Dr. Irish Lack.   PMH includes:  CAD (s/p CABG 2012: LIMA to LAD, SVG to D1, SVG to OM1, SVG to PDA), atrial fibrillation, TIA, HTN, DM, hypertrophic cardiomyopathy, hyperlipidemia, dementia, anemia, hypothyroidism, B carotid artery stenosis, PAD (s/p L SFA angioplasty 2013), glaucoma, GERD. Former smoker. BMI 38. S/p R TKA, L partial THA.   Medications include: ASA, zetia, felodipine, glipizide, lantus, levothyroxine, lisinopril, metformin, metoprolol, prilosec, pioglitazone, pravastatin, seroquel.   Preoperative labs reviewed.    Chest x-ray 05/05/15 reviewed. Findings compatible with mild CHF.  EKG 05/05/15: Sinus rhythm. Prolonged PR interval. Anteroseptal infarct, old. Borderline repolarization abnormality  Carotid duplex US 08/20/15:  - R ICA stenosis 60-79% - L ICA stenosis 80-99%  Nuclear stress test 07/26/14:  - Low risk stress nuclear study with a small, mild fixed apical septal perfusion defect. EF is 52% with mild septal hypokinesis. No ischemia.  Echo 07/26/14:  - Left ventricle: The cavity size was normal. There was moderate concentric hypertrophy. Severe septal hypertrophy - suggestive of hypertrophic cardiomyopathy. No LVOT obstruction is noted - there is a 15 mmHg rest gradient across the AOV. Systolic function was vigorous. The estimated ejection fraction was in the range of 65% to 70%. Wall motion was normal; there were no regional wall motion abnormalities. Doppler parameters are consistent with abnormal left ventricular relaxation (grade 1 diastolic dysfunction). The E/e&' ratio is between 8-15, suggesting indeterminate LV filling pressure. - Aortic valve: Trileaflet. Sclerosis without stenosis. There was no regurgitation. - Left atrium: The atrium was moderately dilated (40 ml/m2). - Right atrium: The atrium was mildly  dilated. - Tricuspid valve: There was moderate regurgitation. - Pulmonary arteries: PA peak pressure: 42 mm Hg (S). - Impressions: LVEF 65-70%, moderate LVH, severe septal hypertrophy concerningfor HCM, no significant LVOT gradient, aortic sclerosis with meangradient of 15 mmHg across the valve. Moderate LAE. Moderate TR,RVSP 42 mmHg.  Cardiac cath 03/13/11 (pre-CABG): 1. Three-vessel coronary artery disease.  2. Preserved left ventricular function with minimal anterior wall hypokinesis.  Pt has cardiac clearance for surgery from Dr. Irish Lack in [place] telephone encounter dated 09/03/15.   If no changes, I anticipate pt can proceed with surgery as scheduled.   [name], FNP-BC [facility]/Anesthesiology Phone: [phone] 09/28/2015 3:24 PM

## 2015-09-28 NOTE — Progress Notes (Signed)
PCP: Dr. Felipa Eth Cardiologist:Dr. Irish Lack  Pt. States she doesn't check blood sugars, doesn't know what fasting sugars are. States her machine isn't working right.

## 2015-09-28 NOTE — Pre-Procedure Instructions (Signed)
[name] [name]  09/28/2015      [facility] [address] [place], [place] [address] Phone: [phone] Fax: [phone]    Your procedure is scheduled on Monday, Jan. 16  Report to [facility] Admitting at 5:30 A.M.  Call this number if you have problems the morning of surgery:  [phone]   Remember:  Do not eat food or drink liquids after midnight Sunday, Jan.15   Take these medicines the morning of surgery with A SIP OF WATER : tylenol if needed, combigan eye drops, prozac(fluoxtine), levothyroxine (synthroid), metoprolol (lopressor) namenda (memantine), omeprazole (prilosec), prednisolone eye drop, tramadol if needed, felodipine (plendil), aspirin             Stop NSAIDs: advil, motrin, ibuprofen, aleve,BC'S, herbal medicines/vitamins.  How to Manage Your Diabetes Before Surgery   Why is it important to control my blood sugar before and after surgery?   Improving blood sugar levels before and after surgery helps healing and can limit problems.  A way of improving blood sugar control is eating a healthy diet by:  - Eating less sugar and carbohydrates  - Increasing activity/exercise  - Talk with your doctor about reaching your blood sugar goals  High blood sugars (greater than 180 mg/dL) can raise your risk of infections and slow down your recovery so you will need to focus on controlling your diabetes during the weeks before surgery.  Make sure that the doctor who takes care of your diabetes knows about your planned surgery including the date and location.  How do I manage my blood sugars before surgery?   Check your blood sugar at least 4 times a day, 2 days before surgery to make sure that they are not too high or low.   Check your blood sugar the morning of your surgery when you wake up and every 2               hours until you get to the Short-Stay unit.  If your blood sugar is  less than 70 mg/dL, you will need to treat for low blood sugar by:  Treat a low blood sugar (less than 70 mg/dL) with 1/2 cup of clear juice (cranberry or apple), 4 glucose tablets, OR glucose gel.  Recheck blood sugar in 15 minutes after treatment (to make sure it is greater than 70 mg/dL).  If blood sugar is not greater than 70 mg/dL on re-check, call [phone] for further instructions.   Report your blood sugar to the Short-Stay nurse when you get to Short-Stay.  References:  [facility], 2007 "How to Manage your Diabetes Before and After Surgery".  What do I do about my diabetes medications?   Do not take oral diabetes medicines (pills) the morning of surgery.    Do not take glipizide (glucotrol) the night before surgery          THE NIGHT BEFORE SURGERY, take 8 units lantus insulin     Do not wear jewelry, make-up or nail polish.  Do not wear lotions, powders, or perfumes.  You may  Not wear deodorant.  Do not shave 48 hours prior to surgery.  Men may shave face and neck.  Do not bring valuables to the hospital.  [facility] is not responsible for any belongings or valuables.  Contacts, dentures or bridgework may not be worn into surgery.  Leave your suitcase in the car.  After  surgery it may be brought to your room.  For patients admitted to the hospital, discharge time will be determined by your treatment team.  Patients discharged the day of surgery will not be allowed to drive home.    Special instructions: review handouts  Please read over the following fact sheets that you were given. Pain Booklet, Coughing and Deep Breathing, Blood Transfusion Information, MRSA Information and Surgical Site Infection Prevention

## 2015-09-29 ENCOUNTER — Telehealth: Payer: Self-pay | Admitting: Vascular Surgery

## 2015-09-29 DIAGNOSIS — Z22322 Carrier or suspected carrier of Methicillin resistant Staphylococcus aureus: Secondary | ICD-10-CM

## 2015-09-29 LAB — HEMOGLOBIN A1C
Hgb A1c MFr Bld: 6.8 % — ABNORMAL HIGH (ref 4.8–5.6)
MEAN PLASMA GLUCOSE: 148 mg/dL

## 2015-09-29 MED ORDER — MUPIROCIN 2 % EX OINT: TOPICAL_OINTMENT | CUTANEOUS | Status: DC

## 2015-09-29 NOTE — Telephone Encounter (Signed)
Pt's pharmacy called for Bactroban order.  Pt scheduled for CEA on Monday with Dr. Donnetta Hutching.  +MRSA nasal swab on pre-op screening.  - Bactroban order entered  Adele Barthel, MD, [place] [facility] [place] Office: [phone] Pager: [phone]  09/29/2015, 2:28 PM

## 2015-10-01 ENCOUNTER — Encounter (HOSPITAL_COMMUNITY): Payer: Self-pay | Admitting: *Deleted

## 2015-10-01 ENCOUNTER — Encounter (HOSPITAL_COMMUNITY)
Admission: RE | Disposition: A | Discharge status: Home / Self Care | Payer: Self-pay | Source: Ambulatory Visit | Attending: Vascular Surgery

## 2015-10-01 ENCOUNTER — Inpatient Hospital Stay (HOSPITAL_COMMUNITY)
Admission: RE | Admit: 2015-10-01 | Discharge: 2015-10-02 | DRG: 038 | Disposition: A | Discharge status: Home / Self Care | Payer: Medicare Other | Source: Ambulatory Visit | Attending: Vascular Surgery | Admitting: Vascular Surgery

## 2015-10-01 ENCOUNTER — Inpatient Hospital Stay (HOSPITAL_COMMUNITY): Payer: Medicare Other | Admitting: Certified Registered"

## 2015-10-01 DIAGNOSIS — Z6837 Body mass index (BMI) 37.0-37.9, adult: Secondary | ICD-10-CM

## 2015-10-01 DIAGNOSIS — M545 Low back pain: Secondary | ICD-10-CM | POA: Diagnosis present

## 2015-10-01 DIAGNOSIS — E039 Hypothyroidism, unspecified: Secondary | ICD-10-CM | POA: Diagnosis present

## 2015-10-01 DIAGNOSIS — I10 Essential (primary) hypertension: Secondary | ICD-10-CM | POA: Diagnosis present

## 2015-10-01 DIAGNOSIS — E669 Obesity, unspecified: Secondary | ICD-10-CM | POA: Diagnosis present

## 2015-10-01 DIAGNOSIS — I6522 Occlusion and stenosis of left carotid artery: Secondary | ICD-10-CM

## 2015-10-01 DIAGNOSIS — I422 Other hypertrophic cardiomyopathy: Secondary | ICD-10-CM | POA: Diagnosis present

## 2015-10-01 DIAGNOSIS — Q281 Other malformations of precerebral vessels: Secondary | ICD-10-CM

## 2015-10-01 DIAGNOSIS — F0390 Unspecified dementia without behavioral disturbance: Secondary | ICD-10-CM | POA: Diagnosis present

## 2015-10-01 DIAGNOSIS — N393 Stress incontinence (female) (male): Secondary | ICD-10-CM | POA: Diagnosis present

## 2015-10-01 DIAGNOSIS — Z87891 Personal history of nicotine dependence: Secondary | ICD-10-CM

## 2015-10-01 DIAGNOSIS — Z8673 Personal history of transient ischemic attack (TIA), and cerebral infarction without residual deficits: Secondary | ICD-10-CM

## 2015-10-01 DIAGNOSIS — I2510 Atherosclerotic heart disease of native coronary artery without angina pectoris: Secondary | ICD-10-CM | POA: Diagnosis present

## 2015-10-01 DIAGNOSIS — E119 Type 2 diabetes mellitus without complications: Secondary | ICD-10-CM | POA: Diagnosis present

## 2015-10-01 DIAGNOSIS — G8929 Other chronic pain: Secondary | ICD-10-CM | POA: Diagnosis present

## 2015-10-01 DIAGNOSIS — E785 Hyperlipidemia, unspecified: Secondary | ICD-10-CM | POA: Diagnosis present

## 2015-10-01 DIAGNOSIS — Z951 Presence of aortocoronary bypass graft: Secondary | ICD-10-CM

## 2015-10-01 DIAGNOSIS — I6523 Occlusion and stenosis of bilateral carotid arteries: Principal | ICD-10-CM | POA: Diagnosis present

## 2015-10-01 DIAGNOSIS — I6529 Occlusion and stenosis of unspecified carotid artery: Secondary | ICD-10-CM | POA: Diagnosis present

## 2015-10-01 HISTORY — PX: ENDARTERECTOMY: SHX5162

## 2015-10-01 LAB — TYPE AND SCREEN
ABO/RH(D): O POS
ANTIBODY SCREEN: NEGATIVE

## 2015-10-01 LAB — GLUCOSE, CAPILLARY
GLUCOSE-CAPILLARY: 96 mg/dL (ref 65–99)
GLUCOSE-CAPILLARY: 154 mg/dL — AB (ref 65–99)
Glucose-Capillary: 126 mg/dL — ABNORMAL HIGH (ref 65–99)
Glucose-Capillary: 119 mg/dL — ABNORMAL HIGH (ref 65–99)

## 2015-10-01 SURGERY — ENDARTERECTOMY, CAROTID
Anesthesia: General | Site: Neck | Laterality: Left

## 2015-10-01 MED ORDER — HYDRALAZINE HCL 20 MG/ML IJ SOLN: 5.0000 mg | INTRAMUSCULAR | Status: DC | PRN

## 2015-10-01 MED ORDER — ACETAMINOPHEN 500 MG PO TABS: 500.0000 mg | ORAL_TABLET | Freq: Every day | ORAL | Status: DC

## 2015-10-01 MED ORDER — METOPROLOL TARTRATE 25 MG PO TABS
25.0000 mg | ORAL_TABLET | Freq: Two times a day (BID) | ORAL | Status: DC
  Administered 2015-10-01 – 2015-10-02 (×2): 25 mg via ORAL
  Filled 2015-10-01 (×2): qty 1

## 2015-10-01 MED ORDER — PROPOFOL 10 MG/ML IV BOLUS
INTRAVENOUS | Status: DC | PRN
  Administered 2015-10-01: 110 mg via INTRAVENOUS

## 2015-10-01 MED ORDER — SODIUM CHLORIDE 0.9 % IV SOLN
INTRAVENOUS | Status: DC | PRN
  Administered 2015-10-01: 500 mL

## 2015-10-01 MED ORDER — GLYCOPYRROLATE 0.2 MG/ML IJ SOLN
INTRAMUSCULAR | Status: AC
  Filled 2015-10-01: qty 3

## 2015-10-01 MED ORDER — LABETALOL HCL 5 MG/ML IV SOLN: INTRAVENOUS | Status: DC | PRN

## 2015-10-01 MED ORDER — NEOSTIGMINE METHYLSULFATE 10 MG/10ML IV SOLN
INTRAVENOUS | Status: AC
  Filled 2015-10-01: qty 1

## 2015-10-01 MED ORDER — BISACODYL 10 MG RE SUPP: 10.0000 mg | Freq: Every day | RECTAL | Status: DC | PRN

## 2015-10-01 MED ORDER — ONDANSETRON HCL 4 MG/2ML IJ SOLN
INTRAMUSCULAR | Status: AC
  Filled 2015-10-01: qty 2

## 2015-10-01 MED ORDER — ONDANSETRON HCL 4 MG/2ML IJ SOLN
INTRAMUSCULAR | Status: DC | PRN
  Administered 2015-10-01: 4 mg via INTRAVENOUS

## 2015-10-01 MED ORDER — FENTANYL CITRATE (PF) 100 MCG/2ML IJ SOLN
INTRAMUSCULAR | Status: AC
  Filled 2015-10-01: qty 2

## 2015-10-01 MED ORDER — POTASSIUM CHLORIDE CRYS ER 20 MEQ PO TBCR
20.0000 meq | EXTENDED_RELEASE_TABLET | Freq: Every day | ORAL | Status: DC | PRN
Start: 2015-10-01 — End: 2015-10-02

## 2015-10-01 MED ORDER — ASPIRIN EC 81 MG PO TBEC
81.0000 mg | DELAYED_RELEASE_TABLET | Freq: Every day | ORAL | Status: DC
  Administered 2015-10-02: 81 mg via ORAL
  Filled 2015-10-01: qty 1

## 2015-10-01 MED ORDER — SODIUM CHLORIDE 0.9 % IV SOLN: 500.0000 mL | Freq: Once | INTRAVENOUS | Status: DC | PRN

## 2015-10-01 MED ORDER — SODIUM CHLORIDE 0.9 % IJ SOLN
INTRAMUSCULAR | Status: AC
  Filled 2015-10-01: qty 10

## 2015-10-01 MED ORDER — FLUOXETINE HCL 20 MG PO CAPS
20.0000 mg | ORAL_CAPSULE | Freq: Every morning | ORAL | Status: DC
  Administered 2015-10-02: 20 mg via ORAL
  Filled 2015-10-01: qty 1

## 2015-10-01 MED ORDER — GLYCOPYRROLATE 0.2 MG/ML IJ SOLN
INTRAMUSCULAR | Status: DC | PRN
  Administered 2015-10-01: 0.3 mg via INTRAVENOUS

## 2015-10-01 MED ORDER — MAGNESIUM SULFATE 2 GM/50ML IV SOLN: 2.0000 g | Freq: Every day | INTRAVENOUS | Status: DC | PRN

## 2015-10-01 MED ORDER — ARTIFICIAL TEARS OP OINT
TOPICAL_OINTMENT | OPHTHALMIC | Status: DC | PRN
Start: 2015-10-01 — End: 2015-10-01
  Administered 2015-10-01: 1 via OPHTHALMIC

## 2015-10-01 MED ORDER — EPHEDRINE SULFATE 50 MG/ML IJ SOLN
INTRAMUSCULAR | Status: AC
  Filled 2015-10-01: qty 1

## 2015-10-01 MED ORDER — ROCURONIUM BROMIDE 50 MG/5ML IV SOLN
INTRAVENOUS | Status: AC
  Filled 2015-10-01: qty 1

## 2015-10-01 MED ORDER — FENTANYL CITRATE (PF) 250 MCG/5ML IJ SOLN
INTRAMUSCULAR | Status: AC
  Filled 2015-10-01: qty 5

## 2015-10-01 MED ORDER — QUETIAPINE FUMARATE 25 MG PO TABS
25.0000 mg | ORAL_TABLET | Freq: Every day | ORAL | Status: DC
  Administered 2015-10-01: 25 mg via ORAL
  Filled 2015-10-01: qty 1

## 2015-10-01 MED ORDER — ONDANSETRON HCL 4 MG/2ML IJ SOLN: 4.0000 mg | Freq: Four times a day (QID) | INTRAMUSCULAR | Status: DC | PRN

## 2015-10-01 MED ORDER — SUCCINYLCHOLINE CHLORIDE 20 MG/ML IJ SOLN
INTRAMUSCULAR | Status: AC
  Filled 2015-10-01: qty 1

## 2015-10-01 MED ORDER — SENNOSIDES-DOCUSATE SODIUM 8.6-50 MG PO TABS: 1.0000 | ORAL_TABLET | Freq: Every evening | ORAL | Status: DC | PRN

## 2015-10-01 MED ORDER — PROPOFOL 10 MG/ML IV BOLUS
INTRAVENOUS | Status: AC
  Filled 2015-10-01: qty 20

## 2015-10-01 MED ORDER — METOPROLOL TARTRATE 1 MG/ML IV SOLN: 2.0000 mg | INTRAVENOUS | Status: DC | PRN

## 2015-10-01 MED ORDER — ACETAMINOPHEN 325 MG PO TABS: 325.0000 mg | ORAL_TABLET | ORAL | Status: DC | PRN

## 2015-10-01 MED ORDER — HEPARIN SODIUM (PORCINE) 1000 UNIT/ML IJ SOLN
INTRAMUSCULAR | Status: AC
  Filled 2015-10-01: qty 1

## 2015-10-01 MED ORDER — LABETALOL HCL 5 MG/ML IV SOLN
INTRAVENOUS | Status: DC | PRN
  Administered 2015-10-01: 10 mg via INTRAVENOUS

## 2015-10-01 MED ORDER — PANTOPRAZOLE SODIUM 40 MG PO TBEC
40.0000 mg | DELAYED_RELEASE_TABLET | Freq: Every day | ORAL | Status: DC
  Administered 2015-10-02: 40 mg via ORAL
  Filled 2015-10-01: qty 1

## 2015-10-01 MED ORDER — LABETALOL HCL 5 MG/ML IV SOLN
INTRAVENOUS | Status: AC
  Filled 2015-10-01: qty 4

## 2015-10-01 MED ORDER — PHENYLEPHRINE 40 MCG/ML (10ML) SYRINGE FOR IV PUSH (FOR BLOOD PRESSURE SUPPORT)
PREFILLED_SYRINGE | INTRAVENOUS | Status: AC
  Filled 2015-10-01: qty 10

## 2015-10-01 MED ORDER — ARTIFICIAL TEARS OP OINT
TOPICAL_OINTMENT | OPHTHALMIC | Status: AC
  Filled 2015-10-01: qty 7

## 2015-10-01 MED ORDER — EZETIMIBE 10 MG PO TABS
10.0000 mg | ORAL_TABLET | Freq: Every day | ORAL | Status: DC
  Administered 2015-10-02: 10 mg via ORAL
  Filled 2015-10-01: qty 1

## 2015-10-01 MED ORDER — PHENOL 1.4 % MT LIQD: 1.0000 | OROMUCOSAL | Status: DC | PRN

## 2015-10-01 MED ORDER — INSULIN GLARGINE 100 UNIT/ML SOLOSTAR PEN: 10.0000 [IU] | PEN_INJECTOR | Freq: Every day | SUBCUTANEOUS | Status: DC

## 2015-10-01 MED ORDER — PRAVASTATIN SODIUM 40 MG PO TABS
80.0000 mg | ORAL_TABLET | Freq: Every day | ORAL | Status: DC
Start: 2015-10-02 — End: 2015-10-02
  Administered 2015-10-02: 80 mg via ORAL
  Filled 2015-10-01: qty 2

## 2015-10-01 MED ORDER — PIOGLITAZONE HCL 30 MG PO TABS
30.0000 mg | ORAL_TABLET | Freq: Every morning | ORAL | Status: DC
  Administered 2015-10-02: 30 mg via ORAL
  Filled 2015-10-01: qty 1

## 2015-10-01 MED ORDER — DOCUSATE SODIUM 100 MG PO CAPS
100.0000 mg | ORAL_CAPSULE | Freq: Every day | ORAL | Status: DC
  Administered 2015-10-02: 100 mg via ORAL
  Filled 2015-10-01: qty 1

## 2015-10-01 MED ORDER — PROTAMINE SULFATE 10 MG/ML IV SOLN
INTRAVENOUS | Status: AC
  Filled 2015-10-01: qty 5

## 2015-10-01 MED ORDER — SODIUM CHLORIDE 0.9 % IV SOLN
INTRAVENOUS | Status: DC
  Administered 2015-10-01: 125 mL/h via INTRAVENOUS

## 2015-10-01 MED ORDER — MIRABEGRON ER 50 MG PO TB24
50.0000 mg | ORAL_TABLET | Freq: Every evening | ORAL | Status: DC
  Filled 2015-10-01 (×2): qty 1

## 2015-10-01 MED ORDER — GUAIFENESIN-DM 100-10 MG/5ML PO SYRP: 15.0000 mL | ORAL_SOLUTION | ORAL | Status: DC | PRN

## 2015-10-01 MED ORDER — METFORMIN HCL ER 500 MG PO TB24
500.0000 mg | ORAL_TABLET | Freq: Two times a day (BID) | ORAL | Status: DC
  Administered 2015-10-02: 500 mg via ORAL
  Filled 2015-10-01: qty 1

## 2015-10-01 MED ORDER — PROTAMINE SULFATE 10 MG/ML IV SOLN
INTRAVENOUS | Status: DC | PRN
  Administered 2015-10-01: 20 mg via INTRAVENOUS
  Administered 2015-10-01 (×3): 10 mg via INTRAVENOUS

## 2015-10-01 MED ORDER — BRIMONIDINE TARTRATE-TIMOLOL 0.2-0.5 % OP SOLN
1.0000 [drp] | Freq: Every day | OPHTHALMIC | Status: DC
  Filled 2015-10-01: qty 5

## 2015-10-01 MED ORDER — NEOSTIGMINE METHYLSULFATE 10 MG/10ML IV SOLN
INTRAVENOUS | Status: DC | PRN
  Administered 2015-10-01: 2.5 mg via INTRAVENOUS

## 2015-10-01 MED ORDER — TRAMADOL HCL 50 MG PO TABS: 50.0000 mg | ORAL_TABLET | Freq: Four times a day (QID) | ORAL | Status: DC | PRN

## 2015-10-01 MED ORDER — LABETALOL HCL 5 MG/ML IV SOLN
10.0000 mg | INTRAVENOUS | Status: DC | PRN
  Administered 2015-10-01: 10 mg via INTRAVENOUS

## 2015-10-01 MED ORDER — ROCURONIUM BROMIDE 100 MG/10ML IV SOLN
INTRAVENOUS | Status: DC | PRN
  Administered 2015-10-01: 50 mg via INTRAVENOUS

## 2015-10-01 MED ORDER — FENTANYL CITRATE (PF) 100 MCG/2ML IJ SOLN
25.0000 ug | INTRAMUSCULAR | Status: DC | PRN
  Administered 2015-10-01 (×2): 25 ug via INTRAVENOUS

## 2015-10-01 MED ORDER — LIDOCAINE HCL (CARDIAC) 20 MG/ML IV SOLN
INTRAVENOUS | Status: AC
  Filled 2015-10-01: qty 10

## 2015-10-01 MED ORDER — INSULIN GLARGINE 100 UNIT/ML ~~LOC~~ SOLN
10.0000 [IU] | Freq: Every day | SUBCUTANEOUS | Status: DC
  Filled 2015-10-01: qty 0.1

## 2015-10-01 MED ORDER — LIDOCAINE HCL (CARDIAC) 20 MG/ML IV SOLN
INTRAVENOUS | Status: DC | PRN
  Administered 2015-10-01: 40 mg via INTRAVENOUS

## 2015-10-01 MED ORDER — 0.9 % SODIUM CHLORIDE (POUR BTL) OPTIME
TOPICAL | Status: DC | PRN
  Administered 2015-10-01: 2000 mL

## 2015-10-01 MED ORDER — ALUM & MAG HYDROXIDE-SIMETH 200-200-20 MG/5ML PO SUSP: 15.0000 mL | ORAL | Status: DC | PRN

## 2015-10-01 MED ORDER — ONDANSETRON HCL 4 MG/2ML IJ SOLN: 4.0000 mg | Freq: Once | INTRAMUSCULAR | Status: DC | PRN

## 2015-10-01 MED ORDER — DEXTROSE 5 % IV SOLN
1.5000 g | INTRAVENOUS | Status: AC
  Administered 2015-10-01: 1.5 g via INTRAVENOUS
  Filled 2015-10-01: qty 1.5

## 2015-10-01 MED ORDER — FENTANYL CITRATE (PF) 100 MCG/2ML IJ SOLN
INTRAMUSCULAR | Status: DC | PRN
  Administered 2015-10-01: 50 ug via INTRAVENOUS
  Administered 2015-10-01: 100 ug via INTRAVENOUS
  Administered 2015-10-01 (×2): 50 ug via INTRAVENOUS

## 2015-10-01 MED ORDER — LIDOCAINE HCL (PF) 1 % IJ SOLN
INTRAMUSCULAR | Status: AC
  Filled 2015-10-01: qty 30

## 2015-10-01 MED ORDER — HEPARIN SODIUM (PORCINE) 1000 UNIT/ML IJ SOLN
INTRAMUSCULAR | Status: DC | PRN
  Administered 2015-10-01: 8000 [IU] via INTRAVENOUS

## 2015-10-01 MED ORDER — GLIPIZIDE ER 5 MG PO TB24
5.0000 mg | ORAL_TABLET | Freq: Every day | ORAL | Status: DC
  Administered 2015-10-01: 5 mg via ORAL
  Filled 2015-10-01 (×2): qty 1

## 2015-10-01 MED ORDER — DEXTROSE 5 % IV SOLN
1.5000 g | Freq: Two times a day (BID) | INTRAVENOUS | Status: AC
  Administered 2015-10-01 – 2015-10-02 (×2): 1.5 g via INTRAVENOUS
  Filled 2015-10-01 (×2): qty 1.5

## 2015-10-01 MED ORDER — LACTATED RINGERS IV SOLN
INTRAVENOUS | Status: DC | PRN
  Administered 2015-10-01: 07:00:00 via INTRAVENOUS

## 2015-10-01 MED ORDER — ESMOLOL HCL 100 MG/10ML IV SOLN
INTRAVENOUS | Status: DC | PRN
  Administered 2015-10-01: 40 mg via INTRAVENOUS
  Administered 2015-10-01: 60 mg via INTRAVENOUS

## 2015-10-01 MED ORDER — LISINOPRIL 5 MG PO TABS
5.0000 mg | ORAL_TABLET | Freq: Every day | ORAL | Status: DC
  Administered 2015-10-02: 5 mg via ORAL
  Filled 2015-10-01: qty 1

## 2015-10-01 MED ORDER — ENOXAPARIN SODIUM 40 MG/0.4ML ~~LOC~~ SOLN
40.0000 mg | SUBCUTANEOUS | Status: DC
  Administered 2015-10-02: 40 mg via SUBCUTANEOUS
  Filled 2015-10-01: qty 0.4

## 2015-10-01 MED ORDER — MORPHINE SULFATE (PF) 2 MG/ML IV SOLN: 2.0000 mg | INTRAVENOUS | Status: DC | PRN

## 2015-10-01 MED ORDER — PHENYLEPHRINE HCL 10 MG/ML IJ SOLN
10.0000 mg | INTRAMUSCULAR | Status: DC | PRN
  Administered 2015-10-01: 20 ug/min via INTRAVENOUS

## 2015-10-01 MED ORDER — ACETAMINOPHEN 650 MG RE SUPP: 325.0000 mg | RECTAL | Status: DC | PRN

## 2015-10-01 MED ORDER — ESMOLOL HCL 100 MG/10ML IV SOLN
INTRAVENOUS | Status: AC
  Filled 2015-10-01: qty 10

## 2015-10-01 MED ORDER — FELODIPINE ER 10 MG PO TB24
10.0000 mg | ORAL_TABLET | Freq: Two times a day (BID) | ORAL | Status: DC
  Administered 2015-10-01 – 2015-10-02 (×2): 10 mg via ORAL
  Filled 2015-10-01 (×3): qty 1

## 2015-10-01 MED ORDER — LEVOTHYROXINE SODIUM 25 MCG PO TABS
125.0000 ug | ORAL_TABLET | Freq: Every day | ORAL | Status: DC
  Administered 2015-10-02: 125 ug via ORAL
  Filled 2015-10-01: qty 1

## 2015-10-01 MED ORDER — BRIMONIDINE TARTRATE 0.2 % OP SOLN
1.0000 [drp] | Freq: Every morning | OPHTHALMIC | Status: DC
  Filled 2015-10-01: qty 5

## 2015-10-01 MED ORDER — MUPIROCIN 2 % EX OINT
TOPICAL_OINTMENT | Freq: Every day | CUTANEOUS | Status: DC
  Administered 2015-10-02: 10:00:00 via TOPICAL

## 2015-10-01 MED ORDER — TIMOLOL MALEATE 0.5 % OP SOLN
1.0000 [drp] | Freq: Every day | OPHTHALMIC | Status: DC
  Filled 2015-10-01: qty 5

## 2015-10-01 MED ORDER — MEMANTINE HCL 5 MG PO TABS
10.0000 mg | ORAL_TABLET | Freq: Two times a day (BID) | ORAL | Status: DC
  Administered 2015-10-01 – 2015-10-02 (×2): 10 mg via ORAL
  Filled 2015-10-01 (×2): qty 2

## 2015-10-01 SURGICAL SUPPLY — 54 items
BENZOIN TINCTURE PRP APPL 2/3 (GAUZE/BANDAGES/DRESSINGS) ×3 IMPLANT
CANISTER SUCTION 2500CC (MISCELLANEOUS) ×3 IMPLANT
CANNULA VESSEL 3MM 2 BLNT TIP (CANNULA) ×6 IMPLANT
CATH ROBINSON RED A/P 18FR (CATHETERS) ×3 IMPLANT
CLIP LIGATING EXTRA MED SLVR (CLIP) ×3 IMPLANT
CLIP LIGATING EXTRA SM BLUE (MISCELLANEOUS) ×3 IMPLANT
CLOSURE WOUND 1/2 X4 (GAUZE/BANDAGES/DRESSINGS) ×1
CRADLE DONUT ADULT HEAD (MISCELLANEOUS) ×3 IMPLANT
DECANTER SPIKE VIAL GLASS SM (MISCELLANEOUS) IMPLANT
DRAIN HEMOVAC 1/8 X 5 (WOUND CARE) IMPLANT
DRSG COVADERM 4X6 (GAUZE/BANDAGES/DRESSINGS) ×3 IMPLANT
DRSG COVADERM 4X8 (GAUZE/BANDAGES/DRESSINGS) IMPLANT
ELECT REM PT RETURN 9FT ADLT (ELECTROSURGICAL) ×3
ELECTRODE REM PT RTRN 9FT ADLT (ELECTROSURGICAL) ×1 IMPLANT
EVACUATOR SILICONE 100CC (DRAIN) IMPLANT
GAUZE SPONGE 4X4 12PLY STRL (GAUZE/BANDAGES/DRESSINGS) ×3 IMPLANT
GEL ULTRASOUND 20GR AQUASONIC (MISCELLANEOUS) IMPLANT
GLOVE BIO SURGEON STRL SZ7.5 (GLOVE) ×3 IMPLANT
GLOVE BIOGEL PI IND STRL 6.5 (GLOVE) ×1 IMPLANT
GLOVE BIOGEL PI IND STRL 7.5 (GLOVE) ×1 IMPLANT
GLOVE BIOGEL PI IND STRL 8 (GLOVE) ×1 IMPLANT
GLOVE BIOGEL PI INDICATOR 6.5 (GLOVE) ×2
GLOVE BIOGEL PI INDICATOR 7.5 (GLOVE) ×2
GLOVE BIOGEL PI INDICATOR 8 (GLOVE) ×2
GLOVE ECLIPSE 7.0 STRL STRAW (GLOVE) ×6 IMPLANT
GLOVE SKINSENSE NS SZ7.0 (GLOVE) ×2
GLOVE SKINSENSE STRL SZ7.0 (GLOVE) ×1 IMPLANT
GLOVE SS BIOGEL STRL SZ 7.5 (GLOVE) ×1 IMPLANT
GLOVE SUPERSENSE BIOGEL SZ 7.5 (GLOVE) ×2
GOWN BRE IMP SLV AUR XL STRL (GOWN DISPOSABLE) ×6 IMPLANT
GOWN STRL REUS W/ TWL LRG LVL3 (GOWN DISPOSABLE) ×3 IMPLANT
GOWN STRL REUS W/TWL LRG LVL3 (GOWN DISPOSABLE) ×6
KIT BASIN OR (CUSTOM PROCEDURE TRAY) ×3 IMPLANT
KIT ROOM TURNOVER OR (KITS) ×3 IMPLANT
NEEDLE 22X1 1/2 (OR ONLY) (NEEDLE) IMPLANT
NS IRRIG 1000ML POUR BTL (IV SOLUTION) ×6 IMPLANT
PACK CAROTID (CUSTOM PROCEDURE TRAY) ×3 IMPLANT
PAD ARMBOARD 7.5X6 YLW CONV (MISCELLANEOUS) ×6 IMPLANT
PATCH HEMASHIELD 8X75 (Vascular Products) ×3 IMPLANT
SHUNT CAROTID BYPASS 10 (VASCULAR PRODUCTS) ×3 IMPLANT
SHUNT CAROTID BYPASS 12FRX15.5 (VASCULAR PRODUCTS) IMPLANT
SPONGE INTESTINAL PEANUT (DISPOSABLE) ×3 IMPLANT
STRIP CLOSURE SKIN 1/2X4 (GAUZE/BANDAGES/DRESSINGS) ×2 IMPLANT
SUT ETHILON 3 0 PS 1 (SUTURE) IMPLANT
SUT PROLENE 6 0 CC (SUTURE) ×12 IMPLANT
SUT SILK 2 0 (SUTURE) ×2
SUT SILK 2-0 18XBRD TIE 12 (SUTURE) ×1 IMPLANT
SUT SILK 3 0 (SUTURE)
SUT SILK 3-0 18XBRD TIE 12 (SUTURE) IMPLANT
SUT VIC AB 3-0 SH 27 (SUTURE) ×6
SUT VIC AB 3-0 SH 27X BRD (SUTURE) ×3 IMPLANT
SUT VICRYL 4-0 PS2 18IN ABS (SUTURE) ×3 IMPLANT
SYR CONTROL 10ML LL (SYRINGE) IMPLANT
WATER STERILE IRR 1000ML POUR (IV SOLUTION) ×3 IMPLANT

## 2015-10-01 NOTE — Op Note (Signed)
     Patient name: [name] [name] MRN: [id number] DOB: 06-Aug-1934 Sex: female  10/01/2015 Pre-operative Diagnosis: Asymptomatic left carotid stenosis Post-operative diagnosis:  Same Surgeon:  Rosetta Posner, M.D. Assistants:  Dr Gae Gallop, [place], [facility] Procedure:    left carotid Endarterectomy with Dacron patch angioplasty, resection of redundant internal carotid artery Anesthesia:  General Blood Loss:  See anesthesia record   Indications for surgery:  >80% stenosis  Procedure in detail:  The patient was taken to the operating and placed in the supine position. The neck was prepped and draped in the usual sterile fashion. An incision was made anterior to the sternocleidomastoid muscle and continued with electrocautery through the platysma muscle. The muscle was retracted posteriorly and the carotid sheath was opened. The facial vein was ligated with 2-0 silk ties and divided. The common carotid artery was encircled with an umbilical tape and Rummel tourniquet. Dissection was continued onto the carotid bifurcation. The superior thyroid artery was controlled with a 2-0 silk Potts tie. The external carotid organ was encircled with a vessel loop and the internal carotid was encircled with umbilical tape and Rummel tourniquet. The hypoglossal and vagus nerves were identified and preserved.  The patient was given systemic heparinization. After adequate circulation time, the internal,external and common carotid arteries were occluded. The common carotid was opened with an 11 blade and the arteriotomy was continued with Potts scissors onto the internal carotid artery. A 10 shunt was passed up the internal carotid artery, allowed to back bleed, and then passed down the common carotid artery. The shunt was secured with Rummel tourniquet. The endarterectomy was begun on the common carotid artery  plaque was divided proximally with Potts scissors. The endarterectomy was continued onto the carotid bifurcation.  The external carotid was endarterectomized by eversion technique and the internal carotid artery was endarterectomized in an open fashion. Remaining debris was removed from the endarterectomy plane. There was significant redundancy in the internal carotid artery. For this reason approximately 8 mm of internal carotid artery were resected. Stay sutures were placed 60 proline marked this area and the redundant area was removed. The internal carotid artery was sewn into into itself with a running 60 proline suture. A Dacron patch was brought to the field and sewn as a patch angioplasty. Prior to completing the anastomosis, the shunt was removed and the usual flushing maneuvers were undertaken. The anastomosis was then completed and flow was restored first to the external and then the internal carotid artery. Excellent flow characteristics were noted with hand-held Doppler in the internal and external carotid arteries.  The patient was given protamine to reverse the heparin. Hemostasis was obtained with electrocautery. The wounds were irrigated with saline. The wound was closed by first reapproximating the sternocleidomastoid muscle over the carotid artery with interrupted 3-0 Vicryl sutures. Next, the platysma was closed with a running 3-0 Vicryl suture. The skin was closed with a 4-0 subcuticular Vicryl suture. Benzoin and Steri-Strips were applied to the incision. A sterile dressing was placed over the incision. All sponge and needle counts were correct. The patient was awakened in the operating room, neurologically intact. They were transferred to the PACU in stable condition.  Carotid stenosis at surgery: Greater than 80%  Disposition:  To PACU in stable condition,neurologically intact  Relevant Operative Details:  Redundant internal carotid artery  Rosetta Posner, M.D. [facility] [place] Office: [phone] Pager:  [phone]

## 2015-10-01 NOTE — Anesthesia Procedure Notes (Signed)
Procedure Name: Intubation Date/Time: 10/01/2015 7:53 AM Performed by: Jacquiline Doe A Pre-anesthesia Checklist: Patient identified, Timeout performed, Emergency Drugs available, Suction available and Patient being monitored Patient Re-evaluated:Patient Re-evaluated prior to inductionOxygen Delivery Method: Circle system utilized Preoxygenation: Pre-oxygenation with 100% oxygen Intubation Type: IV induction and Cricoid Pressure applied Ventilation: Mask ventilation without difficulty and Oral airway inserted - appropriate to patient size Laryngoscope Size: Mac and 3 Grade View: Grade II Tube type: Oral Tube size: 7.5 mm Number of attempts: 1 Airway Equipment and Method: Stylet Placement Confirmation: ETT inserted through vocal cords under direct vision,  breath sounds checked- equal and bilateral and positive ETCO2 Secured at: 21 cm Tube secured with: Tape Dental Injury: Teeth and Oropharynx as per pre-operative assessment

## 2015-10-01 NOTE — Progress Notes (Signed)
  [facility] Day of Surgery Note  Subjective:  Having sore throat and very mild headache.   Filed Vitals:   10/01/15 1500 10/01/15 1505  BP:    Pulse: 82 114  Temp:    Resp: 23 18   Afib rate 80s-90s  Incisions:  Left neck incision dressed with minimal sanguinous drainage on bandage. No palpable hematoma.  Extremities:  5/5 right upper extremity, 4/5 left upper extremity, 5/5 bilateral lower extremities.  Neuro: No smile asymmetry, tongue midline  Assessment/Plan:  This is a 82 y.o. female who is s/p left carotid endarterectomy, day of surgery  Neuro exam intact.  Left neck incision without hematoma.  Anticipate d/c tomorrow.   Virgina Jock, [place] Pager: [phone] 10/01/2015 5:50 PM

## 2015-10-01 NOTE — Transfer of Care (Signed)
Immediate Anesthesia Transfer of Care Note  Patient: [name] [name]  Procedure(s) Performed: Procedure(s): Left CAROTID Endartarectomy (Left)  Patient Location: PACU  Anesthesia Type:General  Level of Consciousness: awake, oriented, sedated, patient cooperative and responds to stimulation  Airway & Oxygen Therapy: Patient Spontanous Breathing and Patient connected to nasal cannula oxygen  Post-op Assessment: Report given to RN, Post -op Vital signs reviewed and stable, Patient moving all extremities, Patient moving all extremities X 4 and Patient able to stick tongue midline  Post vital signs: Reviewed and stable  Last Vitals:  Filed Vitals:   10/01/15 0625  BP: 118/43  Pulse: 69  Temp: 37 C    Complications: No apparent anesthesia complications

## 2015-10-01 NOTE — H&P (Signed)
BRYLIN BALOUN  08/28/2015 3:30 PM  Office Visit  MRN:  [id number]   Description: Female DOB: 04/16/1934  Provider: Rosetta Posner, MD  Department: [facility]       Diagnoses     Internal carotid artery stenosis, bilateral - Primary    ICD-9-CM: 433.10, 433.30 ICD-10-CM: I65.23       Reason for Visit     Re-evaluation    to discuss possible L CEA     Reason for Visit History        Current Vitals  Most recent update: 08/28/2015 3:46 PM by Bard Herbert Maness-Harrison, CMA    BP Pulse Ht Wt BMI SpO2    100/51 mmHg 71 5' (1.524 m) 192 lb 14.4 oz (87.499 kg) 37.67 kg/m2 96%    Vitals History     BMI Data     Body Mass Index Body Surface Area    37.67 kg/m 2 1.92 m 2      Progress Notes      Rosetta Posner, MD at 08/28/2015 4:51 PM     Status: Signed       Expand All Collapse All     Patient name: [name] Bobko TysonMRN: [id number] DOB: 12-19-1935Sex: female  REASON FOR VISIT: Discussion of asymptomatic left carotid stenosis  HPI: [name] [name] is a 82 y.o. female Here today with her daughter for further discussion of asymptomatic carotid stenosis. She was seen last week in routine protocol follow-up. She had had moderate to severe stenosis which was being followed noninvasive lab. Her recent duplex last week showed significant progression in her left internal carotid to greater than 80% stenosis. She has had no focal neurologic deficits. She does have mild dementia but lives independently and has some memory loss. She is quite alert and talkative can answer questions appropriately.  Current Outpatient Prescriptions  Medication Sig Dispense Refill  . acetaminophen (TYLENOL) 500 MG tablet Take 500-1,000 mg by mouth daily as needed (for arthritis pain).    . Ascorbic Acid (VITAMIN C) 1000 MG tablet Take 1,000 mg by mouth daily.    [name] aspirin 81 MG tablet Take 81 mg by mouth daily.    .  cholecalciferol (VITAMIN D) 1000 UNITS tablet Take 1,000 Units by mouth See admin instructions. On Monday, Tuesday, Wednesday, Friday, Saturday.    . COMBIGAN 0.2-0.5 % ophthalmic solution Place 1-2 drops into both eyes daily.   0  . ergocalciferol (VITAMIN D2) 50000 UNITS capsule Take 50,000 Units by mouth 2 (two) times a week. On Thursday and Sunday    . ezetimibe (ZETIA) 10 MG tablet Take 1 tablet (10 mg total) by mouth daily. 30 tablet 6  . felodipine (PLENDIL) 10 MG 24 hr tablet Take two tablets by mouth daily. (Patient taking differently: Take 10 mg by mouth daily. [name]) 60 tablet 11  . FLUoxetine (PROZAC) 20 MG capsule Take 20 mg by mouth every morning.     [name] glipiZIDE (GLUCOTROL XL) 5 MG 24 hr tablet Take 5 mg by mouth at bedtime.     . Insulin Glargine (LANTUS SOLOSTAR) 100 UNIT/ML SOPN Inject 10 Units into the skin at bedtime.    [name] levothyroxine (SYNTHROID, LEVOTHROID) 125 MCG tablet Take 125 mcg by mouth daily before breakfast.     . lisinopril (PRINIVIL,ZESTRIL) 5 MG tablet Take 5 mg by mouth daily.  1  . memantine (NAMENDA) 10 MG tablet Take 1 tablet (10 mg total) by mouth 2 (two) times daily. 60 tablet 6  .  metFORMIN (GLUCOPHAGE) 500 MG tablet Take 500 mg by mouth 2 (two) times daily with a meal.    . metoprolol tartrate (LOPRESSOR) 25 MG tablet Take 25 mg by mouth 2 (two) times daily.     . mirabegron ER (MYRBETRIQ) 50 MG TB24 Take 50 mg by mouth every evening.    [name] omeprazole (PRILOSEC) 20 MG capsule Take 20 mg by mouth daily.    . pioglitazone (ACTOS) 30 MG tablet Take 30 mg by mouth every morning.     . pravastatin (PRAVACHOL) 80 MG tablet 1 tablet daily 90 tablet 2  . prednisoLONE sodium phosphate (INFLAMASE FORTE) 1 % ophthalmic solution Place 1 drop into the right eye 3 (three) times daily.    . QUEtiapine (SEROQUEL) 25 MG tablet   0  . traMADol (ULTRAM) 50 MG tablet Take 50 mg by  mouth every 6 (six) hours as needed.    . [DISCONTINUED] amiodarone (PACERONE) 200 MG tablet Take 200 mg by mouth 3 (three) times a week.      No current facility-administered medications for this visit.    REVIEW OF SYSTEMS:  [X]  denotes positive finding, [ ]  denotes negative finding Cardiac  Comments:  Chest pain or chest pressure:    Shortness of breath upon exertion:    Short of breath when lying flat:    Irregular heart rhythm:    Constitutional    Fever or chills:      PHYSICAL EXAM: Filed Vitals:   08/28/15 1538 08/28/15 1544  BP: 115/55 100/51  Pulse: 71   Height: 5' (1.524 m)   Weight: 192 lb 14.4 oz (87.499 kg)   SpO2: 96%     GENERAL: The patient is a well-nourished female, in no acute distress. The vital signs are documented above. CARDIOVASCULAR: Carotid arteries without bruits bilaterally PULMONARY: There is good air exchange bilaterally without wheezing or rales. 2+ radial pulses bilaterally.  MEDICAL ISSUES: had a very long discussion with patient and her daughter present. Explained degree of stenosis at surgery approximate 5% per year risk of neurologic deficit either stroke or transient ischemic attack related to her severe left carotid stenosis. I did explain her risk for elective endarterectomy for asymptomatic disease in approximately 1.5% risk of stroke with surgery. She has been scheduled to see cardiology for risk stratification regarding surgery. I explained that this be very unlikely that she would not be able to tolerate the endarterectomy. Daughter also discuss this with Dr. Brett Fairy who is assisting her regarding her dementia. We will discuss this further after these further evaluations. We will notify us immediately should she develop any new neurologic deficits  [name] [name] [facility] [place] Beeper: [phone]                 Psych Notes     No notes of  this type exist for this encounter.     [facility] Patient Outreach     No notes of this type exist for this encounter.     Not recorded        Referring Provider     Lajean Manes, MD     Level of Service     PR OFFICE OUTPATIENT VISIT 15 MINUTES [phone]         All Flowsheet Templates (all recorded)     Anthropometrics    Custom Formula Data    Encounter Vitals    Infectious Disease Screening    MEWS Score      Referring  Provider     Lajean Manes, MD     All Charges for This Encounter     Code Description Service Date Service Provider Modifiers Qty    [phone] PR OFFICE OUTPATIENT VISIT 15 MINUTES 08/28/2015 Rosetta Posner, MD  1      Communications Sent     There are no sent or routed communications associated with this encounter.     AVS Reports     Date/Time Report Action User    08/28/2015 4:25 PM After Visit Summary Printed Berniece Salines      Smoking Cessation Audit Trail       Ready to Quit Counseling Given User Date and Time    Office Visit - 08/13/2015 Not Answered No Dennie Bible, NP 08/13/2015 4:27 PM      Diabetic Foot Exam                                                                                                                                                                    Addendum:  The patient has been re-examined and re-evaluated.  The patient's history and physical has been reviewed and is unchanged.    [name] [name] is a 82 y.o. female is being admitted with Left carotid artery stenosis I65.22. All the risks, benefits and other treatment options have been discussed with the patient. The patient has consented to proceed with Procedure(s): ENDARTERECTOMY CAROTID as a surgical intervention.  Curt Jews 10/01/2015 6:53 AM [facility]

## 2015-10-01 NOTE — Anesthesia Postprocedure Evaluation (Signed)
Anesthesia Post Note  Patient: [name] [name]  Procedure(s) Performed: Procedure(s) (LRB): Left CAROTID Endartarectomy (Left)  Patient location during evaluation: PACU Anesthesia Type: General Level of consciousness: awake and awake and alert Pain management: pain level controlled Vital Signs Assessment: post-procedure vital signs reviewed and stable Anesthetic complications: no    Last Vitals:  Filed Vitals:   10/01/15 1500 10/01/15 1505  BP:    Pulse: 82 114  Temp:    Resp: 23 18    Last Pain:  Filed Vitals:   10/01/15 1652  PainSc: 3                  Isabel Freese COKER

## 2015-10-01 NOTE — Anesthesia Preprocedure Evaluation (Addendum)
Anesthesia Evaluation  Patient identified by MRN, date of birth, ID band Patient awake    Reviewed: Allergy & Precautions, NPO status , Patient's Chart, lab work & pertinent test results  Airway Mallampati: II  TM Distance: >3 FB     Dental  (+) Teeth Intact   Pulmonary former smoker,    breath sounds clear to auscultation       Cardiovascular hypertension,  Rhythm:Regular Rate:Normal     Neuro/Psych    GI/Hepatic   Endo/Other  diabetes  Renal/GU      Musculoskeletal   Abdominal   Peds  Hematology   Anesthesia Other Findings   Reproductive/Obstetrics                           Anesthesia Physical Anesthesia Plan  ASA: III  Anesthesia Plan: General/Spinal   Post-op Pain Management:    Induction: Intravenous  Airway Management Planned: Oral ETT  Additional Equipment: Arterial line  Intra-op Plan:   Post-operative Plan: Extubation in OR  Informed Consent: I have reviewed the patients History and Physical, chart, labs and discussed the procedure including the risks, benefits and alternatives for the proposed anesthesia with the patient or authorized representative who has indicated his/her understanding and acceptance.   Dental advisory given  Plan Discussed with: CRNA and Anesthesiologist  Anesthesia Plan Comments:        Anesthesia Quick Evaluation

## 2015-10-02 ENCOUNTER — Encounter (HOSPITAL_COMMUNITY): Payer: Self-pay | Admitting: Vascular Surgery

## 2015-10-02 LAB — GLUCOSE, CAPILLARY
GLUCOSE-CAPILLARY: 81 mg/dL (ref 65–99)
Glucose-Capillary: 123 mg/dL — ABNORMAL HIGH (ref 65–99)

## 2015-10-02 LAB — CBC
HCT: 35.6 % — ABNORMAL LOW (ref 36.0–46.0)
Hemoglobin: 11.7 g/dL — ABNORMAL LOW (ref 12.0–15.0)
MCH: 33.3 pg (ref 26.0–34.0)
MCHC: 32.9 g/dL (ref 30.0–36.0)
MCV: 101.4 fL — ABNORMAL HIGH (ref 78.0–100.0)
PLATELETS: 180 10*3/uL (ref 150–400)
RBC: 3.51 MIL/uL — AB (ref 3.87–5.11)
RDW: 14.6 % (ref 11.5–15.5)
WBC: 7.4 10*3/uL (ref 4.0–10.5)

## 2015-10-02 LAB — BASIC METABOLIC PANEL
ANION GAP: 8 (ref 5–15)
BUN: 15 mg/dL (ref 6–20)
CALCIUM: 8.8 mg/dL — AB (ref 8.9–10.3)
CO2: 25 mmol/L (ref 22–32)
Chloride: 108 mmol/L (ref 101–111)
Creatinine, Ser: 1.02 mg/dL — ABNORMAL HIGH (ref 0.44–1.00)
GFR, EST AFRICAN AMERICAN: 59 mL/min — AB (ref >60)
GFR, EST NON AFRICAN AMERICAN: 51 mL/min — AB (ref >60)
GLUCOSE: 105 mg/dL — AB (ref 65–99)
Potassium: 4.1 mmol/L (ref 3.5–5.1)
SODIUM: 141 mmol/L (ref 135–145)

## 2015-10-02 NOTE — Progress Notes (Signed)
Pt voiding but still feeling full bladder scan showed 800 cc. Paged Dr. Donnetta Hutching he ordered I and O cath performed 850cc returned pt tolerated well

## 2015-10-02 NOTE — Care Management Note (Signed)
Case Management Note  Patient Details  Name: [name] [name] MRN: [id number] Date of Birth: Apr 26, 1934  Subjective/Objective:     Date: 10/01/14 Spoke with patient at the bedside and spoke with daughter [name] [name] on the phone.  Introduced self as Tourist information centre manager and explained role in discharge planning and how to be reached.  Verified patient lives in town, alone and has a grand daughter who comes by twice a week from 9:30- 2 pm to check on her  And help with cleaning up and making some meals, she also has some friends who comes to visit with her as well, pta indep. Has DME cane  And a walker at home. Expressed potential need for no other DME.  Verified patient anticipates to go home alone, her daughter, [name] [name] will transport her home today and get her situated at the house so that she will have everything she needs at time of discharge and will have  part-time supervision by family at this time to best of their knowledge.  Patient denied needing help with their medication. Patient  is driven by her friend to MD appointments.  Verified patient has PCP [place].   Plan: CM will continue to follow for discharge planning and [facility] resources.                Action/Plan:   Expected Discharge Date:                  Expected Discharge Plan:  Home/Self Care  In-House Referral:     Discharge planning Services  CM Consult  Post Acute Care Choice:    Choice offered to:     DME Arranged:    DME Agency:     HH Arranged:    [place] Agency:     Status of Service:  Completed, signed off  Medicare Important Message Given:    Date Medicare IM Given:    Medicare IM give by:    Date Additional Medicare IM Given:    Additional Medicare Important Message give by:     If discussed at [place] of Stay Meetings, dates discussed:    Additional Comments:  Zenon Mayo, RN 10/02/2015, 11:47 AM

## 2015-10-02 NOTE — Progress Notes (Addendum)
[facility] [place]  Subjective  - Doing well, voiding, tolerating PO's.  Some difficulty completly emptying bladder over night.  Nursing performed in/out cath after bladder scan showed 800 cc residual.  Patient states she sees a urologist.   Objective 141/76 89 98.3 F (36.8 C) (Oral) 19 99%  Intake/Output Summary (Last 24 hours) at 10/02/15 0727 Last data filed at 10/02/15 0600  Gross per 24 hour  Intake 3328.75 ml  Output   1550 ml  Net 1778.75 ml    Incision soft without hematoma Palpable radial pulses bilaterally equal Smile symmetric, no tongue deviation Lungs non labored breathing Heart RRR   Assessment/Planning: POD #1 left CEA  Disposition stable for discharge home F/U with Dr. Donnetta Hutching in 2 weeks Repeat carotid duplex in 6 month  Laurence Slate [facility] 10/02/2015 7:27 AM --  Laboratory Lab Results:  Recent Labs  10/02/15 0502  WBC 7.4  HGB 11.7*  HCT 35.6*  PLT 180   BMET  Recent Labs  10/02/15 0502  NA 141  K 4.1  CL 108  CO2 25  GLUCOSE 105*  BUN 15  CREATININE 1.02*  CALCIUM 8.8*    COAG Lab Results  Component Value Date   INR 1.11 09/28/2015   INR 1.01 09/27/2013   INR 1.42 04/17/2011   No results found for: PTT    I have examined the patient, reviewed and agree with above. Comfortable. Up in chair. Neurologically intact. Did require in and out cath 5 AM with 800 cc bladder residual. Has had retention issues with several Botox treatments. Will assure that she is able to void at her baseline prior to discharge later today.  Curt Jews, MD 10/02/2015 8:52 AM

## 2015-10-03 NOTE — Telephone Encounter (Signed)
error 

## 2015-10-05 ENCOUNTER — Telehealth: Payer: Self-pay | Admitting: Vascular Surgery

## 2015-10-05 NOTE — Discharge Summary (Signed)
[facility] Discharge Summary   Patient ID:  [name] [name] MRN: [id number] DOB/AGE: 80-May-1935 80 y.o.  Admit date: 10/01/2015 Discharge date: 10/05/2015 Date of Surgery: 10/01/2015 Surgeon: Surgeon(s): Rosetta Posner, MD Angelia Mould, MD  Admission Diagnosis: Left carotid artery stenosis I65.22  Discharge Diagnoses:  Left carotid artery stenosis I65.22  Secondary Diagnoses: Past Medical History  Diagnosis Date  . Hypertension   . Depression   . CAD (coronary artery disease)   . GERD (gastroesophageal reflux disease)   . Bilateral carotid artery stenosis   . Diverticulitis   . Obesity   . Degenerative joint disease   . Hiatal hernia   . Atrial fibrillation ([place])   . Hypothyroidism   . Type II diabetes mellitus ([place])   . Anemia   . Stroke [facility]) 2011    "old very small TIA/neurologist"  . Chronic lower back pain     "back is very stiff because of fusion; can't stand up straight more than few moments"  . Complication of anesthesia     anesth. meds caused hallucinations   . Anxiety   . Endometriosis   . Fall at home 05/15/2013  . Progressive dementia with uncertain etiology [id number]    Suspect Lewy body .  [name] Bowel incontinence   . HLD (hyperlipidemia)   . Unspecified glaucoma   . Reflux esophagitis   . Hypertonicity of bladder   . Osteoarthrosis, unspecified whether generalized or localized, unspecified site   . Lumbago   . Muscle weakness (generalized)   . Closed fracture of unspecified part of neck of femur   . Female stress incontinence   . Depressive disorder, not elsewhere classified   . Hx of echocardiogram     Echo (11/15):  Mod LVH, severe septal hypertrophy (suggestive HCM), no LVOT obstruction, rest gradient 15 mmHg across AV, vigorous LVF, EF 65-70%, no RWMA, Gr 1 DD, Ao sclerosis (no stenosis), mod LAE, mild RAE, mod TR, PASP 42 mmHg  . Hx of cardiovascular stress test     Lexiscan Myoview (11/15):  Low risk stress nuclear  study with a small, mild fixed apical septal perfusion defect. EF is 52% with mild septal hypokinesis. No ischemia.  . Hypertrophic cardiomyopathy ([place])   . Headache   . Cough     no fever,     Procedure(s): Left CAROTID Endartarectomy  Discharged Condition: good  HPI: [name] [name] is a 82 y.o. female Here today with her daughter for further discussion of asymptomatic carotid stenosis. She was seen last week in routine protocol follow-up. She had had moderate to severe stenosis which was being followed noninvasive lab. Her recent duplex last week showed significant progression in her left internal carotid to greater than 80% stenosis. She has had no focal neurologic deficits. She does have mild dementia but lives independently and has some memory loss. She is quite alert and talkative can answer questions appropriately.   Hospital Course:  [name] [name] is a 82 y.o. female is S/P Procedure(s): Left CAROTID Endartarectomy   POD#1 Doing well, voiding, tolerating PO's. Some difficulty completly emptying bladder over night. Nursing performed in/out cath after bladder scan showed 800 cc residual. Patient states she sees a urologist.  Incision soft without hematoma Palpable radial pulses bilaterally equal Smile symmetric, no tongue deviation Lungs non labored breathing Heart RRR She was voiding 150-200 cc prior to discharge home.  Disposition stable for discharge home F/U with Dr. Donnetta Hutching in 2 weeks Repeat carotid duplex in  6 month  Significant Diagnostic Studies: CBC Lab Results  Component Value Date   WBC 7.4 10/02/2015   HGB 11.7* 10/02/2015   HCT 35.6* 10/02/2015   MCV 101.4* 10/02/2015   PLT 180 10/02/2015    BMET    Component Value Date/Time   NA 141 10/02/2015 0502   K 4.1 10/02/2015 0502   CL 108 10/02/2015 0502   CO2 25 10/02/2015 0502   GLUCOSE 105* 10/02/2015 0502   BUN 15 10/02/2015 0502   CREATININE 1.02* 10/02/2015 0502   CALCIUM 8.8* 10/02/2015  0502   GFRNONAA 51* 10/02/2015 0502   GFRAA 59* 10/02/2015 0502   COAG Lab Results  Component Value Date   INR 1.11 09/28/2015   INR 1.01 09/27/2013   INR 1.42 04/17/2011     Disposition:  Discharge to :Home Discharge Instructions    Call MD for:  redness, tenderness, or signs of infection (pain, swelling, bleeding, redness, odor or green/yellow discharge around incision site)    Complete by:  As directed      Call MD for:  severe or increased pain, loss or decreased feeling  in affected limb(s)    Complete by:  As directed      Call MD for:  temperature >100.5    Complete by:  As directed      Discharge instructions    Complete by:  As directed   You may shower daily.     Discharge patient    Complete by:  As directed   Discharge pt to home     Driving Restrictions    Complete by:  As directed   No driving for 1 weeks     Lifting restrictions    Complete by:  As directed   No lifting for 6 weeks     Resume previous diet    Complete by:  As directed             Medication List    TAKE these medications        acetaminophen 500 MG tablet  Commonly known as:  TYLENOL  Take 500 mg by mouth daily.     aspirin EC 81 MG tablet  Take 81 mg by mouth daily.     cholecalciferol 1000 units tablet  Commonly known as:  VITAMIN D  Take 1,000 Units by mouth See admin instructions. On Monday, Tuesday, Wednesday, Friday, Saturday.     COMBIGAN 0.2-0.5 % ophthalmic solution  Generic drug:  brimonidine-timolol  Place 1-2 drops into both eyes daily.     ergocalciferol 50000 units capsule  Commonly known as:  VITAMIN D2  Take 50,000 Units by mouth 2 (two) times a week. On Thursday and Sunday     ezetimibe 10 MG tablet  Commonly known as:  ZETIA  Take 1 tablet (10 mg total) by mouth daily.     felodipine 10 MG 24 hr tablet  Commonly known as:  PLENDIL  Take two tablets by mouth daily.     FLUoxetine 20 MG capsule  Commonly known as:  PROZAC  Take 20 mg by mouth  every morning.     glipiZIDE 5 MG 24 hr tablet  Commonly known as:  GLUCOTROL XL  Take 5 mg by mouth at bedtime.     LANTUS SOLOSTAR 100 UNIT/ML Solostar Pen  Generic drug:  Insulin Glargine  Inject 10 Units into the skin at bedtime.     levothyroxine 125 MCG tablet  Commonly known as:  SYNTHROID, LEVOTHROID  Take 125 mcg by mouth daily before breakfast.     lisinopril 5 MG tablet  Commonly known as:  PRINIVIL,ZESTRIL  Take 5 mg by mouth daily.     memantine 10 MG tablet  Commonly known as:  NAMENDA  Take 1 tablet (10 mg total) by mouth 2 (two) times daily.     metFORMIN 500 MG 24 hr tablet  Commonly known as:  GLUCOPHAGE-XR  Take 500 mg by mouth 2 (two) times daily.     metoprolol tartrate 25 MG tablet  Commonly known as:  LOPRESSOR  Take 25 mg by mouth 2 (two) times daily.     mirabegron ER 50 MG Tb24 tablet  Commonly known as:  MYRBETRIQ  Take 50 mg by mouth every evening.     mupirocin ointment 2 %  Commonly known as:  BACTROBAN  Apply to each nostril BID x 5 days     omeprazole 20 MG capsule  Commonly known as:  PRILOSEC  Take 20 mg by mouth daily.     pioglitazone 30 MG tablet  Commonly known as:  ACTOS  Take 30 mg by mouth every morning.     pravastatin 80 MG tablet  Commonly known as:  PRAVACHOL  1 tablet daily     prednisoLONE sodium phosphate 1 % ophthalmic solution  Commonly known as:  INFLAMASE FORTE  Place 1 drop into the right eye 3 (three) times daily.     QUEtiapine 25 MG tablet  Commonly known as:  SEROQUEL  Take 25 mg by mouth at bedtime. 1 hour before bed     traMADol 50 MG tablet  Commonly known as:  ULTRAM  Take 1 tablet (50 mg total) by mouth every 6 (six) hours as needed for moderate pain.     vitamin C 1000 MG tablet  Take 1,000 mg by mouth 3 (three) times a week.       Verbal and written Discharge instructions given to the patient. Wound care per Discharge AVS     Follow-up Information    Follow up with Curt Jews, MD In  2 weeks.   Specialties:  Vascular Surgery, Cardiology   Why:  Our office will call you to arrange an appointment (sent)   Contact information:   [place] [address] [phone]       Signed: Laurence Slate [facility] 10/05/2015, 10:40 AM  --- For VQI Registry use --- Instructions: Press F2 to tab through selections.  Delete question if not applicable.   Modified Rankin score at D/C (0-6): Rankin Score=0  IV medication needed for:  1. Hypertension: No 2. Hypotension: No  Post-op Complications: No  1. Post-op CVA or TIA: No  If yes: Event classification (right eye, left eye, right cortical, left cortical, verterobasilar, other):   If yes: Timing of event (intra-op, <6 hrs post-op, >=6 hrs post-op, unknown):   2. CN injury: No  If yes: CN  injuried   3. Myocardial infarction: No  If yes: Dx by (EKG or clinical, Troponin):   4.  CHF: No  5.  Dysrhythmia (new): No  6. Wound infection: No  7. Reperfusion symptoms: No  8. Return to OR: No  If yes: return to OR for (bleeding, neurologic, other CEA incision, other):   Discharge medications: Statin use:  Yes ASA use:  Yes Beta blocker use:  Yes ACE-Inhibitor use:  Yes P2Y12 Antagonist use: [x ] None, [ ]  Plavix, [ ]  Plasugrel, [ ]  Ticlopinine, [ ]  Ticagrelor, [ ]   Other, [ ]  No for medical reason, [ ]  Non-compliant, [ ]  Not-indicated Anti-coagulant use:  [x ] None, [ ]  Warfarin, [ ]  Rivaroxaban, [ ]  Dabigatran, [ ]  Other, [ ]  No for medical reason, [ ]  Non-compliant, [ ]  Not-indicated

## 2015-10-05 NOTE — Telephone Encounter (Signed)
-----   Message from Mena Goes, RN sent at 10/02/2015 10:28 AM EST ----- Regarding: schedule   ----- Message -----    From: Alvia Grove, PA-C    Sent: 10/02/2015  10:19 AM      To: [facility]vs Charge Pool  S/p left carotid endarterectomy 10/01/15  F/u with Dr. Donnetta Hutching in 2 weeks  Thanks Maudie Mercury

## 2015-10-05 NOTE — Telephone Encounter (Signed)
Spoke with patient to arrange, has difficulty with transportation. dpm

## 2015-10-23 ENCOUNTER — Encounter: Payer: Medicare Other | Admitting: Vascular Surgery

## 2015-10-23 ENCOUNTER — Encounter: Payer: Self-pay | Admitting: Vascular Surgery

## 2015-10-29 ENCOUNTER — Ambulatory Visit (INDEPENDENT_AMBULATORY_CARE_PROVIDER_SITE_OTHER): Payer: Medicare Other | Admitting: Ophthalmology

## 2015-10-29 DIAGNOSIS — H35033 Hypertensive retinopathy, bilateral: Secondary | ICD-10-CM

## 2015-10-29 DIAGNOSIS — E113592 Type 2 diabetes mellitus with proliferative diabetic retinopathy without macular edema, left eye: Secondary | ICD-10-CM | POA: Diagnosis not present

## 2015-10-29 DIAGNOSIS — I10 Essential (primary) hypertension: Secondary | ICD-10-CM

## 2015-10-29 DIAGNOSIS — H43813 Vitreous degeneration, bilateral: Secondary | ICD-10-CM | POA: Diagnosis not present

## 2015-10-29 DIAGNOSIS — H338 Other retinal detachments: Secondary | ICD-10-CM

## 2015-10-29 DIAGNOSIS — E113391 Type 2 diabetes mellitus with moderate nonproliferative diabetic retinopathy without macular edema, right eye: Secondary | ICD-10-CM | POA: Diagnosis not present

## 2015-10-29 DIAGNOSIS — E11319 Type 2 diabetes mellitus with unspecified diabetic retinopathy without macular edema: Secondary | ICD-10-CM | POA: Diagnosis not present

## 2015-10-30 ENCOUNTER — Encounter: Payer: Medicare Other | Admitting: Vascular Surgery

## 2015-10-30 ENCOUNTER — Encounter: Payer: Self-pay | Admitting: Vascular Surgery

## 2015-11-06 ENCOUNTER — Encounter: Payer: Self-pay | Admitting: Vascular Surgery

## 2015-11-06 ENCOUNTER — Ambulatory Visit (INDEPENDENT_AMBULATORY_CARE_PROVIDER_SITE_OTHER): Payer: Medicare Other | Admitting: Vascular Surgery

## 2015-11-06 VITALS — BP 145/72 | HR 76 | Temp 97.8°F | Ht 60.0 in | Wt 195.5 lb

## 2015-11-06 DIAGNOSIS — I6523 Occlusion and stenosis of bilateral carotid arteries: Secondary | ICD-10-CM

## 2015-11-06 NOTE — Addendum Note (Signed)
Addended by: Dorthula Rue L on: 11/06/2015 02:24 PM   Modules accepted: Orders

## 2015-11-06 NOTE — Progress Notes (Signed)
Physician assistant for follow-up of left carotid endarterectomy resection of the redundant internal carotid artery on 10/01/2015. She did well and was discharged home with no neurologic deficit. She did have a high-grade asymptomatic stenosis. She did mention well at home. She does report having some cough which she attributes to allergy. Also some mild wheezing.  Past Medical History  Diagnosis Date  . Hypertension   . Depression   . CAD (coronary artery disease)   . GERD (gastroesophageal reflux disease)   . Bilateral carotid artery stenosis   . Diverticulitis   . Obesity   . Degenerative joint disease   . Hiatal hernia   . Atrial fibrillation ([place])   . Hypothyroidism   . Type II diabetes mellitus ([place])   . Anemia   . Stroke [facility]) 2011    "old very small TIA/neurologist"  . Chronic lower back pain     "back is very stiff because of fusion; can't stand up straight more than few moments"  . Complication of anesthesia     anesth. meds caused hallucinations   . Anxiety   . Endometriosis   . Fall at home 05/15/2013  . Progressive dementia with uncertain etiology [id number]    Suspect Lewy body .  [name] Bowel incontinence   . HLD (hyperlipidemia)   . Unspecified glaucoma   . Reflux esophagitis   . Hypertonicity of bladder   . Osteoarthrosis, unspecified whether generalized or localized, unspecified site   . Lumbago   . Muscle weakness (generalized)   . Closed fracture of unspecified part of neck of femur   . Female stress incontinence   . Depressive disorder, not elsewhere classified   . Hx of echocardiogram     Echo (11/15):  Mod LVH, severe septal hypertrophy (suggestive HCM), no LVOT obstruction, rest gradient 15 mmHg across AV, vigorous LVF, EF 65-70%, no RWMA, Gr 1 DD, Ao sclerosis (no stenosis), mod LAE, mild RAE, mod TR, PASP 42 mmHg  . Hx of cardiovascular stress test     Lexiscan Myoview (11/15):  Low risk stress nuclear study with a small, mild fixed apical septal perfusion  defect. EF is 52% with mild septal hypokinesis. No ischemia.  . Hypertrophic cardiomyopathy ([place])   . Headache   . Cough     no fever,     Social History  Substance Use Topics  . Smoking status: Former Smoker -- 1.50 packs/day    Types: Cigarettes    Quit date: 09/16/1987  . Smokeless tobacco: Never Used  . Alcohol Use: No    Family History  Problem Relation Age of Onset  . Cancer Paternal Aunt   . Cancer Cousin   . Heart attack Mother   . Hypertension Mother   . Heart attack Father   . Heart disease Father     Before age 52  . Hyperlipidemia Father   . Hypertension Father     Allergies  Allergen Reactions  . Vicodin [Hydrocodone-Acetaminophen] Other (See Comments)    Hallucinations - do NOT verify Vicodin orders  . Polysaccharide Iron Complex Hives, Itching and Rash    Patient can tolerate Ferrous Sulfate  . Amitriptyline     hallucinations  . Vesicare [Solifenacin]     hallucinations     Current outpatient prescriptions:  .  acetaminophen (TYLENOL) 500 MG tablet, Take 500 mg by mouth daily. , Disp: , Rfl:  .  Ascorbic Acid (VITAMIN C) 1000 MG tablet, Take 1,000 mg by mouth 3 (three) times a week. ,  Disp: , Rfl:  .  aspirin EC 81 MG tablet, Take 81 mg by mouth daily., Disp: , Rfl:  .  cholecalciferol (VITAMIN D) 1000 UNITS tablet, Take 1,000 Units by mouth See admin instructions. On Monday, Tuesday, Wednesday, Friday, Saturday., Disp: , Rfl:  .  COMBIGAN 0.2-0.5 % ophthalmic solution, Place 1-2 drops into both eyes daily. , Disp: , Rfl: 0 .  ergocalciferol (VITAMIN D2) 50000 UNITS capsule, Take 50,000 Units by mouth 2 (two) times a week. On Thursday and Sunday, Disp: , Rfl:  .  ezetimibe (ZETIA) 10 MG tablet, Take 1 tablet (10 mg total) by mouth daily., Disp: 30 tablet, Rfl: 6 .  felodipine (PLENDIL) 10 MG 24 hr tablet, Take two tablets by mouth daily. (Patient taking differently: Take 10 mg by mouth 2 (two) times daily. [name]), Disp: 60 tablet, Rfl: 11 .  FLUoxetine  (PROZAC) 20 MG capsule, Take 20 mg by mouth every morning. , Disp: , Rfl:  .  glipiZIDE (GLUCOTROL XL) 5 MG 24 hr tablet, Take 5 mg by mouth at bedtime. , Disp: , Rfl:  .  Insulin Glargine (LANTUS SOLOSTAR) 100 UNIT/ML SOPN, Inject 10 Units into the skin at bedtime., Disp: , Rfl:  .  levothyroxine (SYNTHROID, LEVOTHROID) 125 MCG tablet, Take 125 mcg by mouth daily before breakfast. , Disp: , Rfl:  .  lisinopril (PRINIVIL,ZESTRIL) 5 MG tablet, Take 5 mg by mouth daily., Disp: , Rfl: 1 .  memantine (NAMENDA) 10 MG tablet, Take 1 tablet (10 mg total) by mouth 2 (two) times daily., Disp: 60 tablet, Rfl: 6 .  metFORMIN (GLUCOPHAGE-XR) 500 MG 24 hr tablet, Take 500 mg by mouth 2 (two) times daily., Disp: , Rfl: 1 .  metoprolol tartrate (LOPRESSOR) 25 MG tablet, Take 25 mg by mouth 2 (two) times daily.  , Disp: , Rfl:  .  mirabegron ER (MYRBETRIQ) 50 MG TB24, Take 50 mg by mouth every evening., Disp: , Rfl:  .  mupirocin ointment (BACTROBAN) 2 %, Apply to each nostril BID x 5 days, Disp: 22 g, Rfl: 0 .  omeprazole (PRILOSEC) 20 MG capsule, Take 20 mg by mouth daily., Disp: , Rfl:  .  pioglitazone (ACTOS) 30 MG tablet, Take 30 mg by mouth every morning. , Disp: , Rfl:  .  pravastatin (PRAVACHOL) 80 MG tablet, 1 tablet daily (Patient taking differently: Take 80 mg by mouth. 1 tablet daily), Disp: 90 tablet, Rfl: 2 .  prednisoLONE sodium phosphate (INFLAMASE FORTE) 1 % ophthalmic solution, Place 1 drop into the right eye 3 (three) times daily., Disp: , Rfl:  .  QUEtiapine (SEROQUEL) 25 MG tablet, Take 25 mg by mouth at bedtime. 1 hour before bed, Disp: , Rfl: 0 .  traMADol (ULTRAM) 50 MG tablet, Take 1 tablet (50 mg total) by mouth every 6 (six) hours as needed for moderate pain., Disp: 30 tablet, Rfl: 0 .  [DISCONTINUED] amiodarone (PACERONE) 200 MG tablet, Take 200 mg by mouth 3 (three) times a week. , Disp: , Rfl:   Filed Vitals:   11/06/15 1243 11/06/15 1245  BP: 144/68 145/72  Pulse: 76   Temp: 97.8  F (36.6 C)   TempSrc: Oral   Height: 5' (1.524 m)   Weight: 195 lb 8 oz (88.678 kg)   SpO2: 97%     Body mass index is 38.18 kg/(m^2).       On physical exam her left neck incision is well-healed. She has no bruits bilaterally. She does have 2+ radial pulses  bilaterally  Stable overall. We'll continue usual activities. We will see her again in 6 months with repeat carotid duplex. She will let us know if she has any neurologic deficits or wound issues.

## 2015-11-13 ENCOUNTER — Ambulatory Visit (INDEPENDENT_AMBULATORY_CARE_PROVIDER_SITE_OTHER): Payer: Medicare Other

## 2015-11-13 ENCOUNTER — Encounter: Payer: Self-pay | Admitting: Podiatry

## 2015-11-13 ENCOUNTER — Ambulatory Visit (INDEPENDENT_AMBULATORY_CARE_PROVIDER_SITE_OTHER): Payer: Medicare Other | Admitting: Podiatry

## 2015-11-13 VITALS — BP 127/87 | HR 93 | Temp 98.0°F | Resp 14

## 2015-11-13 DIAGNOSIS — R52 Pain, unspecified: Secondary | ICD-10-CM

## 2015-11-13 DIAGNOSIS — L97521 Non-pressure chronic ulcer of other part of left foot limited to breakdown of skin: Secondary | ICD-10-CM

## 2015-11-13 DIAGNOSIS — L89891 Pressure ulcer of other site, stage 1: Secondary | ICD-10-CM | POA: Diagnosis not present

## 2015-11-13 MED ORDER — SILVER SULFADIAZINE 1 % EX CREA: 1.0000 "application " | TOPICAL_CREAM | Freq: Every day | CUTANEOUS | Status: DC

## 2015-11-13 NOTE — Progress Notes (Signed)
   Subjective:    Patient ID: [name] [name], female    DOB: 1934-01-02, 82 y.o.   MRN: [id number]  HPI     Patient presents again complaining of a skin lesion on the lateral border of the left foot that is occasionally sore with direct shoe pressure. Patient currently soaking in warm Epsom salts. This patient was last seen on 05/02/2015 for this similar problem. At that time there is a skin lesions the base of fifth left metatarsal that was open at approximately 8 months. At that time a diagnosis of a superficial noninfected ulceration fifth left metatarsal with a history of peripheral arterial disease and diabetes. Patient was scheduled to return for office 2 weeks after the visit of 05/02/2015, however, does not present for the same problem until 11/13/2015. Also, patient mentions that in the past week she may have bumped the left foot against the refrigerator.  There is a history of peripheral vascular disease on the left and 2013 with the SFA angioplasty, left   Review of Systems  All other systems reviewed and are negative.      Objective:   Physical Exam  Patient is able to answer questions with some difficulty and does not have a strong recollection of history of this problem that she presents today. Her aid is present in the treatment room as well She seems to be orientated 3  Vascular: No calf edema or calf tenderness bilaterally DP and PT pulses 0/4 bilaterally Capillary reflex delay bilaterally  Neurological: Sensation to 10 g monofilament wire intact 2/5 bilaterally Vibratory sensation week and reactive bilaterally Ankle reflexes week reactive bilaterally  Dermatological: Superficial 2 mm ulcer lateral base of fifth left metatarsal surrounded with slight reactive keratoses. There is no active drainage, malodor, warmth surrounding the wound. The wound does not probe treat.  Musculoskeletal: No deformities noted bilaterally  X-ray weightbearing left foot dated  11/13/2015  Intact bony structures without fracture and/or dislocation Decreased bone density noted in all views No evidence of bone activity at the base of fifth left metatarsal No emphysema noted No increased soft tissue density noted  Radiographic impression: No acute bony abnormality noted left foot    Assessment & Plan:    Assessment: Peripheral arterial disease Non-heating diabetic ulceration base of fifth left metatarsal without clinical sign of infection or underlying bone activity  Plan: Today I review the results of examination x-ray with patient and aid in treatment room. I made him aware that this was a similar wound that with soft previously and has showed no signs healing at this time. General debridement of the margins of the ulcer base fifth metatarsal, left foot with application of Silvadene and gauze pad  Rx Silvadene cream to be applied to ulcer site base fifth left metatarsal and cover with gauze pad daily Wear soft shoe over this area Patient advised if develop any sudden pain, swelling, redness present to emergency department  Reevaluate 2 weeks

## 2015-11-13 NOTE — Patient Instructions (Signed)
Today her diabetic foot examination with x-ray demonstrated a nonhealing skin ulcer on the border of the left foot. Apply Silvadene cream and a gauze pad daily to the skin ulcer on the left foot Wear a soft shoe If you develop any sudden pain, swelling, redness, warmth present to the emergency department  Diabetes and Foot Care Diabetes may cause you to have problems because of poor blood supply (circulation) to your feet and legs. This may cause the skin on your feet to become thinner, break easier, and heal more slowly. Your skin may become dry, and the skin may peel and crack. You may also have nerve damage in your legs and feet causing decreased feeling in them. You may not notice minor injuries to your feet that could lead to infections or more serious problems. Taking care of your feet is one of the most important things you can do for yourself.  HOME CARE INSTRUCTIONS  Wear shoes at all times, even in the house. Do not go barefoot. Bare feet are easily injured.  Check your feet daily for blisters, cuts, and redness. If you cannot see the bottom of your feet, use a mirror or ask someone for help.  Wash your feet with warm water (do not use hot water) and mild soap. Then pat your feet and the areas between your toes until they are completely dry. Do not soak your feet as this can dry your skin.  Apply a moisturizing lotion or petroleum jelly (that does not contain alcohol and is unscented) to the skin on your feet and to dry, brittle toenails. Do not apply lotion between your toes.  Trim your toenails straight across. Do not dig under them or around the cuticle. File the edges of your nails with an emery board or nail file.  Do not cut corns or calluses or try to remove them with medicine.  Wear clean socks or stockings every day. Make sure they are not too tight. Do not wear knee-high stockings since they may decrease blood flow to your legs.  Wear shoes that fit properly and have enough  cushioning. To break in new shoes, wear them for just a few hours a day. This prevents you from injuring your feet. Always look in your shoes before you put them on to be sure there are no objects inside.  Do not cross your legs. This may decrease the blood flow to your feet.  If you find a minor scrape, cut, or break in the skin on your feet, keep it and the skin around it clean and dry. These areas may be cleansed with mild soap and water. Do not cleanse the area with peroxide, alcohol, or iodine.  When you remove an adhesive bandage, be sure not to damage the skin around it.  If you have a wound, look at it several times a day to make sure it is healing.  Do not use heating pads or hot water bottles. They may burn your skin. If you have lost feeling in your feet or legs, you may not know it is happening until it is too late.  Make sure your health care provider performs a complete foot exam at least annually or more often if you have foot problems. Report any cuts, sores, or bruises to your health care provider immediately. SEEK MEDICAL CARE IF:   You have an injury that is not healing.  You have cuts or breaks in the skin.  You have an ingrown nail.  You notice redness on your legs or feet.  You feel burning or tingling in your legs or feet.  You have pain or cramps in your legs and feet.  Your legs or feet are numb.  Your feet always feel cold. SEEK IMMEDIATE MEDICAL CARE IF:   There is increasing redness, swelling, or pain in or around a wound.  There is a red line that goes up your leg.  Pus is coming from a wound.  You develop a fever or as directed by your health care provider.  You notice a bad smell coming from an ulcer or wound.   This information is not intended to replace advice given to you by your health care provider. Make sure you discuss any questions you have with your health care provider.   Document Released: 08/29/2000 Document Revised: 05/04/2013  Document Reviewed: 02/08/2013 Elsevier Interactive Patient Education [employer].

## 2015-11-28 ENCOUNTER — Ambulatory Visit (INDEPENDENT_AMBULATORY_CARE_PROVIDER_SITE_OTHER): Payer: Medicare Other | Admitting: Podiatry

## 2015-11-28 ENCOUNTER — Encounter: Payer: Self-pay | Admitting: Podiatry

## 2015-11-28 VITALS — BP 139/102 | HR 86 | Resp 12

## 2015-11-28 DIAGNOSIS — E1151 Type 2 diabetes mellitus with diabetic peripheral angiopathy without gangrene: Secondary | ICD-10-CM

## 2015-11-28 DIAGNOSIS — L89891 Pressure ulcer of other site, stage 1: Secondary | ICD-10-CM | POA: Diagnosis not present

## 2015-11-28 DIAGNOSIS — L97521 Non-pressure chronic ulcer of other part of left foot limited to breakdown of skin: Secondary | ICD-10-CM

## 2015-11-28 NOTE — Patient Instructions (Signed)
Continue to apply a very small amount of Silvadene cream on the skin ulcer on the side of the left foot and cover with a gauze pad or Band-Aid daily Wear a thin cotton white sock on the left foot Wear a soft or cutout shoe on the left foot  Diabetes and Foot Care Diabetes may cause you to have problems because of poor blood supply (circulation) to your feet and legs. This may cause the skin on your feet to become thinner, break easier, and heal more slowly. Your skin may become dry, and the skin may peel and crack. You may also have nerve damage in your legs and feet causing decreased feeling in them. You may not notice minor injuries to your feet that could lead to infections or more serious problems. Taking care of your feet is one of the most important things you can do for yourself.  HOME CARE INSTRUCTIONS  Wear shoes at all times, even in the house. Do not go barefoot. Bare feet are easily injured.  Check your feet daily for blisters, cuts, and redness. If you cannot see the bottom of your feet, use a mirror or ask someone for help.  Wash your feet with warm water (do not use hot water) and mild soap. Then pat your feet and the areas between your toes until they are completely dry. Do not soak your feet as this can dry your skin.  Apply a moisturizing lotion or petroleum jelly (that does not contain alcohol and is unscented) to the skin on your feet and to dry, brittle toenails. Do not apply lotion between your toes.  Trim your toenails straight across. Do not dig under them or around the cuticle. File the edges of your nails with an emery board or nail file.  Do not cut corns or calluses or try to remove them with medicine.  Wear clean socks or stockings every day. Make sure they are not too tight. Do not wear knee-high stockings since they may decrease blood flow to your legs.  Wear shoes that fit properly and have enough cushioning. To break in new shoes, wear them for just a few hours  a day. This prevents you from injuring your feet. Always look in your shoes before you put them on to be sure there are no objects inside.  Do not cross your legs. This may decrease the blood flow to your feet.  If you find a minor scrape, cut, or break in the skin on your feet, keep it and the skin around it clean and dry. These areas may be cleansed with mild soap and water. Do not cleanse the area with peroxide, alcohol, or iodine.  When you remove an adhesive bandage, be sure not to damage the skin around it.  If you have a wound, look at it several times a day to make sure it is healing.  Do not use heating pads or hot water bottles. They may burn your skin. If you have lost feeling in your feet or legs, you may not know it is happening until it is too late.  Make sure your health care provider performs a complete foot exam at least annually or more often if you have foot problems. Report any cuts, sores, or bruises to your health care provider immediately. SEEK MEDICAL CARE IF:   You have an injury that is not healing.  You have cuts or breaks in the skin.  You have an ingrown nail.  You notice redness  on your legs or feet.  You feel burning or tingling in your legs or feet.  You have pain or cramps in your legs and feet.  Your legs or feet are numb.  Your feet always feel cold. SEEK IMMEDIATE MEDICAL CARE IF:   There is increasing redness, swelling, or pain in or around a wound.  There is a red line that goes up your leg.  Pus is coming from a wound.  You develop a fever or as directed by your health care provider.  You notice a bad smell coming from an ulcer or wound.   This information is not intended to replace advice given to you by your health care provider. Make sure you discuss any questions you have with your health care provider.   Document Released: 08/29/2000 Document Revised: 05/04/2013 Document Reviewed: 02/08/2013 Elsevier Interactive Patient  Education [employer].

## 2015-11-29 NOTE — Progress Notes (Signed)
Patient ID: [name] [name], female   DOB: [birth year], 82 y.o.   MRN: [id number]   Patient presents again complaining of a skin lesion on the lateral border of the left foot that is occasionally sore with direct shoe pressure. Onset approximately January 2016. Patient currently soaking in warm Epsom salts and applying Silvadene cream to the area with a small dressing on a daily basis.   Patient was scheduled to return for office 2 weeks after the visit of 05/02/2015, however, does not present for the same problem until 11/13/2015. There is a history of peripheral vascular disease on the left and 2013 with the SFA angioplasty, left   Review of Systems  All other systems reviewed and are negative. Patient is recovering from carotid endarterectomy on 10/01/2015     Objective:   Physical Exam  Patient is able to answer questions with some difficulty and does not have a strong recollection of history of this problem that she presents today. Her aid is present in the treatment room as well She seems to be orientated 3  Vascular: No calf edema or calf tenderness bilaterally DP and PT pulses 0/4 bilaterally Capillary reflex delay bilaterally  Neurological: Sensation to 10 g monofilament wire intact 2/5 bilaterally Vibratory sensation week and reactive bilaterally Ankle reflexes week reactive bilaterally  Dermatological: Superficial 2x1 mm ulcer lateral base of fifth left metatarsal surrounded with slight reactive keratoses. The lesion is superficial and the base is beginning to develop some peripheral formation of pink skin. There is no active drainage, malodor, warmth surrounding the wound.   Musculoskeletal: No deformities noted bilaterally  X-ray weightbearing left foot dated 11/13/2015  Intact bony structures without fracture and/or dislocation Decreased bone density noted in all views No evidence of bone activity at the base of fifth left metatarsal No emphysema noted No increased  soft tissue density noted  Radiographic impression: No acute bony abnormality noted left foot    Assessment & Plan:    Assessment: Type II diabetic Peripheral arterial disease Non-heating (slow healing?) diabetic ulceration base of fifth left metatarsal without clinical sign of infection or underlying bone activity with slight reduction in size  Plan: General debridement of ulcer margins and surrounding skin of the ulcer base fifth left metatarsal area with application of Silvadene dressing. Patient will continue similar home treatment on a daily basis Advised patient to wear soft shoe or cutout shoe over the ulcer site on the left foot this lesion has shown some reduction in size and only requires occasional debridement , I will extend the next with visit from 2 weeks to 4 weeks. Patient advised if she observes  any sudden pain, swelling, redness in or around the ulcer site present to emergency department  Reevaluate 4 weeks

## 2015-12-11 ENCOUNTER — Ambulatory Visit
Admission: RE | Admit: 2015-12-11 | Discharge: 2015-12-11 | Disposition: A | Discharge status: Home / Self Care | Payer: Medicare Other | Source: Ambulatory Visit | Attending: Geriatric Medicine | Admitting: Geriatric Medicine

## 2015-12-11 ENCOUNTER — Other Ambulatory Visit: Payer: Self-pay | Admitting: Geriatric Medicine

## 2015-12-11 ENCOUNTER — Other Ambulatory Visit: Payer: Self-pay | Admitting: Neurology

## 2015-12-11 DIAGNOSIS — R059 Cough, unspecified: Secondary | ICD-10-CM

## 2015-12-11 DIAGNOSIS — R05 Cough: Secondary | ICD-10-CM

## 2015-12-13 ENCOUNTER — Other Ambulatory Visit: Payer: Self-pay | Admitting: Neurology

## 2015-12-13 NOTE — Telephone Encounter (Signed)
[name] [name] called said pt's medication was last filled on 09/29/15 with 1 refill but RX expired 11/06/15. She needs new RX sent to pharmacy.

## 2015-12-26 ENCOUNTER — Ambulatory Visit: Payer: Medicare Other | Admitting: Podiatry

## 2016-01-16 ENCOUNTER — Ambulatory Visit (INDEPENDENT_AMBULATORY_CARE_PROVIDER_SITE_OTHER): Payer: Medicare Other | Admitting: Podiatry

## 2016-01-16 ENCOUNTER — Encounter: Payer: Self-pay | Admitting: Podiatry

## 2016-01-16 ENCOUNTER — Other Ambulatory Visit: Payer: Self-pay | Admitting: Interventional Cardiology

## 2016-01-16 VITALS — BP 127/76 | HR 66 | Temp 97.6°F | Resp 12

## 2016-01-16 DIAGNOSIS — L89891 Pressure ulcer of other site, stage 1: Secondary | ICD-10-CM | POA: Diagnosis not present

## 2016-01-16 DIAGNOSIS — L97521 Non-pressure chronic ulcer of other part of left foot limited to breakdown of skin: Secondary | ICD-10-CM

## 2016-01-16 NOTE — Patient Instructions (Signed)
Continue to apply Silvadene cream to the small skin ulcer on the side of the left foot, cover with gauze and attach with paper tape, daily If you notice any sudden increase in pain, swelling, redness present to ED

## 2016-01-17 ENCOUNTER — Other Ambulatory Visit: Payer: Self-pay | Admitting: Geriatric Medicine

## 2016-01-17 DIAGNOSIS — M5441 Lumbago with sciatica, right side: Secondary | ICD-10-CM

## 2016-01-17 NOTE — Progress Notes (Signed)
Patient ID: [name] [name], female   DOB: 09/27/1933, 82 y.o.   MRN: [id number]  Subjective Patient presents again complaining of a skin lesion on the lateral border of the left foot that is occasionally sore with direct shoe pressure. Onset approximately January 2016. Patient currently soaking in warm Epsom salts and applying Silvadene cream to the area with a small dressing on a daily basis. Patient was scheduled to return for office 2 weeks after the visit of 05/02/2015, however, does not present for the same problem until 11/13/2015. There is a history of peripheral vascular disease on the left and 2013 with the SFA angioplasty, left Ulcer size on the visit of 11/28/2015 was 2 x 1 mm Patient is able to answer questions with some difficulty and does not have a strong recollection of history of this problem that she presents today.   Patient does not have gauze dressing over the wound on the left foot. She admits that she does not always apply a dressing to the foot on a daily basis but does seem to indicate that she apply Silvadene cream to the area on a regular basis  Objective: Vascular: No calf edema or calf tenderness bilaterally DP and PT pulses 0/4 bilaterally Capillary reflex delay bilaterally  Neurological: Sensation to 10 g monofilament wire intact 2/5 bilaterally Vibratory sensation week and reactive bilaterally Ankle reflexes week reactive bilaterally  Dermatological: Superficial 1x1 mm ulcer lateral base of fifth left metatarsal surrounded with slight reactive keratoses. The lesion is superficial and the base is beginning to develop some peripheral formation of pink skin. There is no active drainage, malodor, warmth surrounding the wound.   Musculoskeletal: No deformities noted bilaterally  X-ray weightbearing left foot dated 11/13/2015  Intact bony structures without fracture and/or dislocation Decreased bone density noted in all views No evidence of bone activity at the  base of fifth left metatarsal No emphysema noted No increased soft tissue density noted  Radiographic impression: No acute bony abnormality noted left foot  Assessment: Type II diabetic Peripheral arterial disease Non-heating (slow healing?) diabetic ulceration base of fifth left metatarsal without clinical sign of infection or underlying bone activity with slight reduction in size Noncompliance of patient and application of Silvadene and or gauze pad to the ulcer site on a daily basis  Plan: General debridement of ulcer margins and surrounding skin of the ulcer base fifth left metatarsal area with application of Silvadene dressing. Patient will continue similar home treatment on a daily basis Advised patient to wear soft shoe or cutout shoe over the ulcer site on the left foot this lesion has shown some reduction in size and only requires occasional debridement  Patient is advised strongly today to apply Silvadene cream and a gauze dressing on a daily basis. I dispensed a sample 2 x 2 gauze pad with 2 inch micro-port tape and demonstrated again to patient how to apply this dressing on a daily basis.  I will extend the next with visit from 2 weeks to 4 weeks. Patient advised if she observes any sudden pain, swelling, redness in or around the ulcer site present to emergency department  Reevaluate 4 weeks

## 2016-01-18 ENCOUNTER — Other Ambulatory Visit: Payer: Self-pay | Admitting: *Deleted

## 2016-01-18 MED ORDER — PRAVASTATIN SODIUM 80 MG PO TABS: ORAL_TABLET | ORAL | Status: DC

## 2016-01-25 ENCOUNTER — Ambulatory Visit
Admission: RE | Admit: 2016-01-25 | Discharge: 2016-01-25 | Disposition: A | Discharge status: Home / Self Care | Payer: Medicare Other | Source: Ambulatory Visit | Attending: Geriatric Medicine | Admitting: Geriatric Medicine

## 2016-01-25 DIAGNOSIS — M5441 Lumbago with sciatica, right side: Secondary | ICD-10-CM

## 2016-01-31 ENCOUNTER — Other Ambulatory Visit: Payer: Self-pay | Admitting: Interventional Cardiology

## 2016-02-01 ENCOUNTER — Other Ambulatory Visit: Payer: Self-pay | Admitting: Interventional Cardiology

## 2016-02-12 ENCOUNTER — Encounter: Payer: Self-pay | Admitting: Neurology

## 2016-02-12 ENCOUNTER — Ambulatory Visit (INDEPENDENT_AMBULATORY_CARE_PROVIDER_SITE_OTHER): Payer: Medicare Other | Admitting: Neurology

## 2016-02-12 VITALS — BP 138/82 | HR 76 | Resp 20 | Ht 60.0 in | Wt 193.0 lb

## 2016-02-12 DIAGNOSIS — E1143 Type 2 diabetes mellitus with diabetic autonomic (poly)neuropathy: Secondary | ICD-10-CM | POA: Diagnosis not present

## 2016-02-12 DIAGNOSIS — I480 Paroxysmal atrial fibrillation: Secondary | ICD-10-CM

## 2016-02-12 DIAGNOSIS — K3184 Gastroparesis: Secondary | ICD-10-CM

## 2016-02-12 DIAGNOSIS — E08319 Diabetes mellitus due to underlying condition with unspecified diabetic retinopathy without macular edema: Secondary | ICD-10-CM | POA: Diagnosis not present

## 2016-02-12 DIAGNOSIS — G301 Alzheimer's disease with late onset: Secondary | ICD-10-CM | POA: Diagnosis not present

## 2016-02-12 DIAGNOSIS — F0280 Dementia in other diseases classified elsewhere without behavioral disturbance: Secondary | ICD-10-CM

## 2016-02-12 HISTORY — DX: Dementia in other diseases classified elsewhere, unspecified severity, without behavioral disturbance, psychotic disturbance, mood disturbance, and anxiety: F02.80

## 2016-02-12 HISTORY — DX: Type 2 diabetes mellitus with diabetic autonomic (poly)neuropathy: E11.43

## 2016-02-12 HISTORY — DX: Gastroparesis: K31.84

## 2016-02-12 HISTORY — DX: Diabetes mellitus due to underlying condition with unspecified diabetic retinopathy without macular edema: E08.319

## 2016-02-12 MED ORDER — DONEPEZIL HCL 5 MG PO TABS: 5.0000 mg | ORAL_TABLET | Freq: Every day | ORAL | Status: DC

## 2016-02-12 NOTE — Patient Instructions (Signed)
Donepezil tablets What is this medicine? DONEPEZIL (doe NEP e zil) is used to treat mild to moderate dementia caused by Alzheimer's disease. This medicine may be used for other purposes; ask your health care provider or pharmacist if you have questions. What should I tell my health care provider before I take this medicine? They need to know if you have any of these conditions: -asthma or other lung disease -difficulty passing urine -head injury -heart disease -history of irregular heartbeat -liver disease -seizures (convulsions) -stomach or intestinal disease, ulcers or stomach bleeding -an unusual or allergic reaction to donepezil, other medicines, foods, dyes, or preservatives -pregnant or trying to get pregnant -breast-feeding How should I use this medicine? Take this medicine by mouth with a glass of water. Follow the directions on the prescription label. You may take this medicine with or without food. Take this medicine at regular intervals. This medicine is usually taken before bedtime. Do not take it more often than directed. Continue to take your medicine even if you feel better. Do not stop taking except on your doctor's advice. If you are taking the 23 mg donepezil tablet, swallow it whole; do not cut, crush, or chew it. Talk to your pediatrician regarding the use of this medicine in children. Special care may be needed. Overdosage: If you think you have taken too much of this medicine contact a poison control center or emergency room at once. NOTE: This medicine is only for you. Do not share this medicine with others. What if I miss a dose? If you miss a dose, take it as soon as you can. If it is almost time for your next dose, take only that dose, do not take double or extra doses. What may interact with this medicine? Do not take this medicine with any of the following medications: -certain medicines for fungal infections like itraconazole, fluconazole, posaconazole, and  voriconazole -cisapride -dextromethorphan; quinidine -dofetilide -dronedarone -pimozide -quinidine -thioridazine -ziprasidone This medicine may also interact with the following medications: -antihistamines for allergy, cough and cold -atropine -bethanechol -carbamazepine -certain medicines for bladder problems like oxybutynin, tolterodine -certain medicines for Parkinson's disease like benztropine, trihexyphenidyl -certain medicines for stomach problems like dicyclomine, hyoscyamine -certain medicines for travel sickness like scopolamine -dexamethasone -ipratropium -NSAIDs, medicines for pain and inflammation, like ibuprofen or naproxen -other medicines for Alzheimer's disease -other medicines that prolong the QT interval (cause an abnormal heart rhythm) -phenobarbital -phenytoin -rifampin, rifabutin or rifapentine This list may not describe all possible interactions. Give your health care provider a list of all the medicines, herbs, non-prescription drugs, or dietary supplements you use. Also tell them if you smoke, drink alcohol, or use illegal drugs. Some items may interact with your medicine. What should I watch for while using this medicine? Visit your doctor or health care professional for regular checks on your progress. Check with your doctor or health care professional if your symptoms do not get better or if they get worse. You may get drowsy or dizzy. Do not drive, use machinery, or do anything that needs mental alertness until you know how this drug affects you. What side effects may I notice from receiving this medicine? Side effects that you should report to your doctor or health care professional as soon as possible: -allergic reactions like skin rash, itching or hives, swelling of the face, lips, or tongue -changes in vision -feeling faint or lightheaded, falls -problems with balance -redness, blistering, peeling or loosening of the skin, including inside the  mouth -slow heartbeat,   or palpitations -stomach pain -unusual bleeding or bruising, red or purple spots on the skin -vomiting -weight loss Side effects that usually do not require medical attention (report to your doctor or health care professional if they continue or are bothersome): -diarrhea, especially when starting treatment -headache -indigestion or heartburn -loss of appetite -muscle cramps -nausea This list may not describe all possible side effects. Call your doctor for medical advice about side effects. You may report side effects to FDA at 1-800-FDA-1088. Where should I keep my medicine? Keep out of reach of children. Store at room temperature between 15 and 30 degrees C (59 and 86 degrees F). Throw away any unused medicine after the expiration date. NOTE: This sheet is a summary. It may not cover all possible information. If you have questions about this medicine, talk to your doctor, pharmacist, or health care provider.    2016, Elsevier/[employer]. (2014-04-13 07:51:52)  

## 2016-02-12 NOTE — Progress Notes (Signed)
[facility]  Provider:  Larey Seat, M D  Referring Provider: Lajean Manes, MD Primary Care Physician:  Mathews Argyle, MD  dementia   HPI:  [name] [name] is a 82 y.o. female  Is seen here as a routine revisit, interval history from 02/12/2016.  [name] [name] has recovered mostly from [name] [name] shoulder surgery affecting the right dominant shoulder. Incidentally , she presents in a wheelchair with [name] [name] right leg slightly propped up. She reports that she gets more tired these days and she is just slower but usually she can manage well with a 4 pronged cane and a wheelchair was here to give [name] [name] some assistance and not to exhaust [name] [name]. The patient has a known history of memory impairment and today score 20 out of 30 points on the Mini-Mental Status Examination by Lillia Corporal. She did very well with clock drawing 44 points, word sentence and copy to an image without difficulties. She stumbled today over the animal fluency test. MMSE - Mini Mental State Exam 02/12/2016 08/13/2015  Orientation to time 4 4  Orientation to Place 4 5  Registration 3 3  Attention/ Calculation 1 4  Recall 1 1  Language- name 2 objects 2 2  Language- repeat 0 1  Language- follow 3 step command 3 3  Language- read & follow direction 1 1  Write a sentence 1 1  Copy design 0 1  Total score 20 26    I corrected the result of today's Mini-Mental Status Examination 221 patient was able to copy a design and write a sentence she was fully oriented to year, month and day of the week , as well as season and to city, county and state. She was able to find the office when [name] [name] daughter drove [name] [name], remembers the [address] location of [employer].   CM - visit  11- 2015 .This patient had undergone a shoulder replacement in January 2015 and went again to Rehab at [facility] for a 2 month period, followed by out patient therapy until last week.  [name] [name] has followed [name] [name] for eye-care,  and she reported [name] [name] right eye remained irritated and itching. [name] [name] eyes are very dry. She has used Restasis for several years, but stopped because of the price. She is on Lumigan eye drops still. She is conversant and pleasant. The patient was not longer aware that she had gotten lost several times. The daughter reminded [name] [name] that these events truly took place. She returned home and in September was seen by [name] [name] cardiologist for a possible TIA , numbness of the left arm- she presented to urgent care first ( Dr. Michail Sermon is PCP) . In the past she had a transient left facial droop. She had carotid doppler study with Dr. Irish Lack in September, those were less than 70% , Dr Early agreed and  suggested q 6 month follow up , left  Carotid stenosis of 65% and 45 % on the right.  She has completely recovered. Given [name] [name] chronic a fib on ASA, I would not like to add any anticoagulation- she is at high fall risk.  MOCA today 08-02-14 at only 21 -30 points, not confused, but more delayed in [name] [name] responses. She lost vision since [name] [name] last retinal detachment which may account for some of the lower score. She has dry eyes, followed by [name] [name], OD. She forgot all 5 recall words. She feels not depressed. She cries sometimes with sad movies, but this is not true affect  incontinence/ pseudobulbar .    [name] [name] has been an established patient since 2011 in the [facility] practice. She was initially referred here for mild memory loss and hallucinations.  The hallucinations began after a hip replacement in March 2011, after which she moved to rehabilitation at [facility]. She had experienced a noticeable difference in [name] [name] handwriting and a left facial droop but a CT scan was negative for stroke. In August 2013 she left [employer],  and then returned home and now again lives alone.  [name] [name] Mini-Mental Status Examination was 29 of 30 points or more, was reaching last time  a 30 points AFT was 10 points. Fall risk was 10 points  in 2013 .  In December 2013 to return for follow up with another  MMSE., which documented 26/30 , followed by [facility] testing at  16/30 points.  I repeated the [employer]  at 13/30 points.  [name] [name] fall risk was  increased to 15 points, GDS (geriatric depression score) endorsed at 5 points. The patient underwent another eye surgery on May 8th of this year. She had decreasing visual acuity , [name] [name] right eye remained red, puffy  and it burned. She has gotten confused when walking to the local bank, has been getting lost on familiar roads and she is not longer permitted to drive.  The 2011  hallucinations were described as " a strong sense that a snake was in the room " ,that there were snakes in [name] [name] house-  she also has heard a hissing  sounds and rales and feared that the snake was in [name] [name] room.. She called the police feel that somebody would break into a home,13 times- and as she recalled did not get a warm response. With the beginning of treatment with Seroquel , [name] [name]  hallucinations resolved. She still reports vivid and at  times crazy dreams. REM BD.  There is no documented nocturnal activity for the last 12 months, but the patient also lives along and has no witnesses.  Review of Systems: Out of a complete 14 system review, the patient complains of only the following symptoms, and all other reviewed systems are negative.   02-12-2016 Vision loss, fall risk high, walks  Still on 4 pod cane. Often uses wheelchair for longer distance. Memory loss, high degree of fatigue, vision is limited, burning pain in the right eye,  Had retinal detachment. She has bowel and urinary incontinence for 24 month .  Current Outpatient Prescriptions  Medication Sig Dispense Refill  . acetaminophen (TYLENOL) 500 MG tablet Take 500 mg by mouth daily.     . Ascorbic Acid (VITAMIN C) 1000 MG tablet Take 1,000 mg by mouth 3 (three) times a week.     [name] aspirin EC 81 MG tablet Take 81 mg by mouth daily.    . cholecalciferol (VITAMIN D)  1000 UNITS tablet Take 1,000 Units by mouth See admin instructions. On Monday, Tuesday, Wednesday, Friday, Saturday.    . COMBIGAN 0.2-0.5 % ophthalmic solution Place 1-2 drops into both eyes daily.   0  . ergocalciferol (VITAMIN D2) 50000 UNITS capsule Take 50,000 Units by mouth 2 (two) times a week. On Thursday and Sunday    . ezetimibe (ZETIA) 10 MG tablet Take 1 tablet (10 mg total) by mouth daily. 30 tablet 6  . felodipine (PLENDIL) 10 MG 24 hr tablet Take 2 tablets (20 mg total) by mouth daily. Patient is overdue for a one year follow up appointment. Please call to schedule (Patient  taking differently: Take 10 mg by mouth daily. Patient is overdue for a one year follow up appointment. Please call to schedule) 60 tablet 0  . FLUoxetine (PROZAC) 20 MG capsule Take 20 mg by mouth every morning.     [name] glipiZIDE (GLUCOTROL XL) 5 MG 24 hr tablet Take 5 mg by mouth at bedtime.     . Insulin Glargine (LANTUS SOLOSTAR) 100 UNIT/ML SOPN Inject 10 Units into the skin at bedtime.    [name] levothyroxine (SYNTHROID, LEVOTHROID) 125 MCG tablet Take 125 mcg by mouth daily before breakfast.     . memantine (NAMENDA) 10 MG tablet Take 1 tablet (10 mg total) by mouth 2 (two) times daily. 60 tablet 6  . metFORMIN (GLUCOPHAGE-XR) 500 MG 24 hr tablet Take 500 mg by mouth 2 (two) times daily.  1  . metoprolol tartrate (LOPRESSOR) 25 MG tablet Take 25 mg by mouth 2 (two) times daily.      . mirabegron ER (MYRBETRIQ) 50 MG TB24 Take 50 mg by mouth every evening.    . mupirocin ointment (BACTROBAN) 2 % Apply to each nostril BID x 5 days 22 g 0  . omeprazole (PRILOSEC) 20 MG capsule Take 20 mg by mouth daily.    . pioglitazone (ACTOS) 30 MG tablet Take 30 mg by mouth every morning.     . pravastatin (PRAVACHOL) 80 MG tablet take 1 tablet by mouth once daily 60 tablet 0  . prednisoLONE sodium phosphate (INFLAMASE FORTE) 1 % ophthalmic solution Place 1 drop into the right eye 3 (three) times daily.    . QUEtiapine (SEROQUEL)  25 MG tablet TAKE 1 TABLET BY MOUTH AT BEDTIME; MAY REPEAT ONCE IF NEEDED 60 tablet 6  . silver sulfADIAZINE (SILVADENE) 1 % cream Apply 1 application topically daily. 50 g 0  . traMADol (ULTRAM) 50 MG tablet Take 1 tablet (50 mg total) by mouth every 6 (six) hours as needed for moderate pain. 30 tablet 0  . [DISCONTINUED] amiodarone (PACERONE) 200 MG tablet Take 200 mg by mouth 3 (three) times a week.      No current facility-administered medications for this visit.    Allergies as of 02/12/2016 - Review Complete 02/12/2016  Allergen Reaction Noted  . Vicodin [hydrocodone-acetaminophen] Other (See Comments) 05/09/2011  . Polysaccharide iron complex Hives, Itching, and Rash 05/12/2011  . Amitriptyline  09/27/2013  . Vesicare [solifenacin]  09/27/2013    Vitals: BP 138/82 mmHg  Pulse 76  Resp 20  Ht 5' (1.524 m)  Wt 193 lb (87.544 kg)  BMI 37.69 kg/m2 Last Weight:  Wt Readings from Last 1 Encounters:  02/12/16 193 lb (87.544 kg)   Last Height:   Ht Readings from Last 1 Encounters:  02/12/16 5' (1.524 m)    Physical exam:  General: The patient is awake, alert and appears not in acute distress. The patient is well groomed. Head: Normocephalic, atraumatic. Neck is supple.  Cardiovascular:  irregular rate and rhythm without carotid bruit, and without distended neck veins. Respiratory: Lungs are clear to auscultation. Skin:  Without evidence of edema, or rash Trunk: BMI is elevated . The  patient has fallen in June 2014, none since, uses 4 prong cane.    Neurologic exam : The patient is awake and alert, oriented to place and time.  Memory subjective  described as declining ,  MOCA last visit  was remarkable at 24 points. Today 08-02-14 , 21-30 points.   There is no longer a normal attention span & concentration  ability.  Speech is fluent without dysarthria, dysphonia or aphasia.  Mood and affect are appropriate. She is happy.  Cranial nerves: Pupils are equal and briskly  reactive to light. [name] [name] right eye is reddish swollen. Extraocular movements  in vertical and horizontal planes intact and without nystagmus. Visual fields by finger perimetry are intact. Hearing to finger rub intact.  Facial sensation intact to fine touch.  Facial motor strength is symmetric - there is a hint of left facial droop - and tongue and uvula move midline.  Motor exam:    normal strength in upper extremities. Good grip strength. She has restored [name] [name] ROM for the shoulder.   Sensory:  Fine touch, pinprick and vibration were tested in all extremities normal. No numbness today.    Coordination: Rapid alternating movements in the fingers/hands are slow .  Finger-to-nose maneuver tested and normal without evidence of ataxia, dysmetria or tremor.   Gait and station: Patient walks with assistive device, very slow and leaning to the left.    Deep tendon reflexes: in the  upper and lower extremities are symmetric and intact. Babinski maneuver response is still downgoing.   Assessment:  Advancing memory los, inability to multitask, delay in motor responses and poor vision.   The patient is not allowed to drive any longer.    DEMENTIA versus mild cognitive impairment. Lewy body or frontal lobe dementia were suspected, but she no longer has hallucinations of auditory character on medication.   [name] [name] MOCA today was much better than the last, which can be seen in day to day variabilty in lewy body. She lacks the cogwheeling.   Dicussed driving restrictions - she should not even drive shopping or in [name] [name] residential area.  She did have orthostatic dizziness at times. Wheel chair prevents falls.      Audel Coakley, MD

## 2016-02-13 ENCOUNTER — Encounter (INDEPENDENT_AMBULATORY_CARE_PROVIDER_SITE_OTHER): Payer: Medicare Other | Admitting: Podiatry

## 2016-02-13 NOTE — Progress Notes (Signed)
This encounter was created in error - please disregard.

## 2016-02-21 ENCOUNTER — Other Ambulatory Visit: Payer: Self-pay | Admitting: Interventional Cardiology

## 2016-03-26 ENCOUNTER — Other Ambulatory Visit: Payer: Self-pay | Admitting: Interventional Cardiology

## 2016-04-01 ENCOUNTER — Telehealth: Payer: Self-pay | Admitting: Interventional Cardiology

## 2016-04-01 MED ORDER — EZETIMIBE 10 MG PO TABS: 10.0000 mg | ORAL_TABLET | Freq: Every day | ORAL | Status: DC

## 2016-04-01 NOTE — Telephone Encounter (Signed)
New message    *STAT* If patient is at the pharmacy, call can be transferred to refill team.   1. Which medications need to be refilled? (please list name of each medication and dose if known) Ezetimibe 10mg   2. Which pharmacy/location (including street and city if local pharmacy) is medication to be sent to? [facility] [place]  3. Do they need a 30 day or 90 day supply?  Not sure

## 2016-04-02 ENCOUNTER — Ambulatory Visit: Payer: Medicare Other | Admitting: Podiatry

## 2016-04-23 ENCOUNTER — Other Ambulatory Visit: Payer: Self-pay | Admitting: Interventional Cardiology

## 2016-04-28 ENCOUNTER — Ambulatory Visit (INDEPENDENT_AMBULATORY_CARE_PROVIDER_SITE_OTHER): Payer: Medicare Other | Admitting: Ophthalmology

## 2016-04-28 DIAGNOSIS — E11319 Type 2 diabetes mellitus with unspecified diabetic retinopathy without macular edema: Secondary | ICD-10-CM | POA: Diagnosis not present

## 2016-04-28 DIAGNOSIS — H43813 Vitreous degeneration, bilateral: Secondary | ICD-10-CM

## 2016-04-28 DIAGNOSIS — I10 Essential (primary) hypertension: Secondary | ICD-10-CM

## 2016-04-28 DIAGNOSIS — H35033 Hypertensive retinopathy, bilateral: Secondary | ICD-10-CM | POA: Diagnosis not present

## 2016-04-28 DIAGNOSIS — E113593 Type 2 diabetes mellitus with proliferative diabetic retinopathy without macular edema, bilateral: Secondary | ICD-10-CM

## 2016-04-28 DIAGNOSIS — H338 Other retinal detachments: Secondary | ICD-10-CM

## 2016-04-30 ENCOUNTER — Encounter: Payer: Self-pay | Admitting: Podiatry

## 2016-04-30 ENCOUNTER — Ambulatory Visit (INDEPENDENT_AMBULATORY_CARE_PROVIDER_SITE_OTHER): Payer: Medicare Other | Admitting: Podiatry

## 2016-04-30 VITALS — BP 133/86 | HR 88 | Resp 12

## 2016-04-30 DIAGNOSIS — L89891 Pressure ulcer of other site, stage 1: Secondary | ICD-10-CM | POA: Diagnosis not present

## 2016-04-30 DIAGNOSIS — L97521 Non-pressure chronic ulcer of other part of left foot limited to breakdown of skin: Secondary | ICD-10-CM

## 2016-04-30 NOTE — Patient Instructions (Signed)
Apply Silvadene cream to the small skin ulcers on your left big toe, fourth left toe, and the side of the left foot daily Attach the gauze pad to the toes with 1 inch Coflex tape do not overtighten Use paper tape to attach the pad on the side of the left foot Wear the surgical shoe on the left foot If you develop any sudden increase in pain, swelling, redness in the left foot present to the emergency department

## 2016-05-01 NOTE — Progress Notes (Signed)
Patient ID: [name] [name], female   DOB: 1934/08/15, 82 y.o.   MRN: [id number]   Subjective: This patient presents today from the follow-up visit of 01/16/2016. Patient has a skin ulcer on the lateral aspect base fifth left metatarsal with initial symptoms in January 2016. Patient has had ongoing local wound care, applying Silvadene cream to this area. The skin ulcer has remained small and persistent over time. The cause of the skin lesions patient's peripheral arterial disease and has been managed by vascular surgeon with a history of vascular surgery on the left leg, SFA angioplasty in 2013. Patient has ongoing monitoring with vascular surgeon. Since the visit of 01/16/2016 patient has notice additional wounds on the left foot including left hallux in the dorsal aspect left foot. She's had no specific treatment for these new areas. She denies applying any Silvadene cream or even notify our office her vascular surgeon's office The patient has a friend present in the treatment room  Objective: Orientated 3  Vascular: No calf edema or calf tenderness bilaterally DP and PT pulses 0/4 bilaterally Capillary reflex delayed bilaterally  Neurological: Sensation to 10 g monofilament wire intact 2/5 bilaterally Vibratory sensation reactive bilaterally Ankle reflexes reactive bilaterally  Dermatological: 1 mm skin lesion lateral base fifth metatarsal crusted that there remains open after debridement. There is no surrounding erythema, edema, drainage surrounding this area  New superficial skin ulcer 1 mm medial left hallux. The base appears granular. There is no surrounding erythema, edema, drainage  Dorsal fourth left MPJ has eschar/crossed over the dorsal PIPJ that appears to be closed after debridement. There is no surrounding drainage, erythema, warmth from this area  Assessment: Peripheral arterial disease Skin ulcerations 2 left Eschar fourth left toe potentially  pre-ulcerative  Plan: Today I reviewed the results of exam with patient and patient's friend in the treatment room. I made him aware that there were 2 open skin lesions and 1 area on the fourth left toe that potentially could become a skin ulcer. The ulcers were debrided and dressed with Silvadene. The patient's friend today is going to help patient dress the wounds with Silvadene and gauze dressings at least 3-4 times a week. Surgical shoe dispensed to wear and left foot  Reappoint 2 weeks or sooner if patient has a concern Patient was advised to observe these areas if she noted increaseinpain,swelling,redness,fevertopresenttotheemergencydepartment

## 2016-05-09 ENCOUNTER — Encounter: Payer: Self-pay | Admitting: Vascular Surgery

## 2016-05-13 ENCOUNTER — Ambulatory Visit (INDEPENDENT_AMBULATORY_CARE_PROVIDER_SITE_OTHER): Payer: Medicare Other | Admitting: Vascular Surgery

## 2016-05-13 ENCOUNTER — Ambulatory Visit (HOSPITAL_COMMUNITY)
Admission: RE | Admit: 2016-05-13 | Discharge: 2016-05-13 | Disposition: A | Discharge status: Home / Self Care | Payer: Medicare Other | Source: Ambulatory Visit | Attending: Vascular Surgery | Admitting: Vascular Surgery

## 2016-05-13 ENCOUNTER — Encounter: Payer: Self-pay | Admitting: Vascular Surgery

## 2016-05-13 VITALS — BP 139/66 | HR 70 | Temp 99.0°F | Resp 18 | Ht 61.0 in | Wt 198.7 lb

## 2016-05-13 DIAGNOSIS — I6523 Occlusion and stenosis of bilateral carotid arteries: Secondary | ICD-10-CM

## 2016-05-13 DIAGNOSIS — I739 Peripheral vascular disease, unspecified: Secondary | ICD-10-CM | POA: Diagnosis not present

## 2016-05-13 NOTE — Progress Notes (Signed)
[facility] [place]  Patient name: [name] [name] MRN: [id number] DOB: Aug 31, 1934 Sex: female  REASON FOR VISIT: Follow-up  HPI: [name] [name] is a 82 y.o. female who presents for follow-up of her bilateral carotid disease. She underwent left carotid endarterectomy with resection of redundant internal carotid artery on 10/01/2015. This was done for high-grade asymptomatic stenosis. The patient denies any amaurosis fugax, sudden onset weakness or numbness of the extremities, expressive or receptive aphasia.   The patient previously has been followed for PAD with ulceration to the left lateral foot.  Her arteriogram from 05/16/2015 showed left SFA and popliteal artery occlusion with runoff via below-knee popliteal and anterior tibial artery. Her ulcer has since healed with conservative management.Her ABIs were 0.4 on the left and 0.8 on the right in December 2016.  She does have a small ulceration to the medial aspect of her left great toe and on top of the left fourth toe that has been present for about 3 weeks. She has been followed by podiatrist Dr. Amalia Hailey. She states that these are improving. He does complain of numbness of her feet bilaterally. She denies any rest pain. She denies any claudication, although she does not ambulate enough to elicit claudication.  She takes a daily aspirin. She has been recently diagnosed with dementia. She lives at home alone.  Past Medical History:  Diagnosis Date  . Anemia   . Anxiety   . Atrial fibrillation ([place])   . Bilateral carotid artery stenosis   . Bowel incontinence   . CAD (coronary artery disease)   . Chronic lower back pain    "back is very stiff because of fusion; can't stand up straight more than few moments"  . Closed fracture of unspecified part of neck of femur   . Complication of anesthesia    anesth. meds caused hallucinations   . Cough    no fever,   . Degenerative joint disease   . Depression   .  Depressive disorder, not elsewhere classified   . Diverticulitis   . Endometriosis   . Fall at home 05/15/2013  . Female stress incontinence   . GERD (gastroesophageal reflux disease)   . Headache   . Hiatal hernia   . HLD (hyperlipidemia)   . Hx of cardiovascular stress test    Lexiscan Myoview (11/15):  Low risk stress nuclear study with a small, mild fixed apical septal perfusion defect. EF is 52% with mild septal hypokinesis. No ischemia.  [name] Hx of echocardiogram    Echo (11/15):  Mod LVH, severe septal hypertrophy (suggestive HCM), no LVOT obstruction, rest gradient 15 mmHg across AV, vigorous LVF, EF 65-70%, no RWMA, Gr 1 DD, Ao sclerosis (no stenosis), mod LAE, mild RAE, mod TR, PASP 42 mmHg  . Hypertension   . Hypertonicity of bladder   . Hypertrophic cardiomyopathy ([place])   . Hypothyroidism   . Lumbago   . Muscle weakness (generalized)   . Obesity   . Osteoarthrosis, unspecified whether generalized or localized, unspecified site   . Progressive dementia with uncertain etiology [id number]   Suspect Lewy body .  [name] Reflux esophagitis   . Stroke [facility]) 2011   "old very small TIA/neurologist"  . Type II diabetes mellitus ([place])   . Unspecified glaucoma     Family History  Problem Relation Age of Onset  . Cancer Paternal Aunt   . Cancer Cousin   . Heart attack Mother   . Hypertension Mother   .  Heart attack Father   . Heart disease Father     Before age 71  . Hyperlipidemia Father   . Hypertension Father     SOCIAL HISTORY: Social History  Substance Use Topics  . Smoking status: Former Smoker    Packs/day: 1.50    Types: Cigarettes    Quit date: 09/16/1987  . Smokeless tobacco: Never Used  . Alcohol use No    Allergies  Allergen Reactions  . Vicodin [Hydrocodone-Acetaminophen] Other (See Comments)    Hallucinations - do NOT verify Vicodin orders  . Polysaccharide Iron Complex Hives, Itching and Rash    Patient can tolerate Ferrous Sulfate  . Amitriptyline      hallucinations  . Vesicare [Solifenacin]     hallucinations    Current Outpatient Prescriptions  Medication Sig Dispense Refill  . acetaminophen (TYLENOL) 500 MG tablet Take 500 mg by mouth daily.     . Ascorbic Acid (VITAMIN C) 1000 MG tablet Take 1,000 mg by mouth 3 (three) times a week.     [name] aspirin EC 81 MG tablet Take 81 mg by mouth daily.    . cholecalciferol (VITAMIN D) 1000 UNITS tablet Take 1,000 Units by mouth See admin instructions. On Monday, Tuesday, Wednesday, Friday, Saturday.    . COMBIGAN 0.2-0.5 % ophthalmic solution Place 1-2 drops into both eyes daily.   0  . donepezil (ARICEPT) 5 MG tablet Take 1 tablet (5 mg total) by mouth at bedtime. 30 tablet 5  . ergocalciferol (VITAMIN D2) 50000 UNITS capsule Take 50,000 Units by mouth 2 (two) times a week. On Thursday and Sunday    . ezetimibe (ZETIA) 10 MG tablet Take 1 tablet (10 mg total) by mouth daily. 60 tablet 0  . felodipine (PLENDIL) 10 MG 24 hr tablet Take 2 tablets (20 mg total) by mouth daily. Please keep 06/02/16 appointment with Dr Irish Lack 60 tablet 1  . FLUoxetine (PROZAC) 20 MG capsule Take 20 mg by mouth every morning.     [name] glipiZIDE (GLUCOTROL XL) 5 MG 24 hr tablet Take 5 mg by mouth at bedtime.     . Insulin Glargine (LANTUS SOLOSTAR) 100 UNIT/ML SOPN Inject 10 Units into the skin at bedtime. Patient states she forgot to take her insulin injection last night before bedtime.    [name] levothyroxine (SYNTHROID, LEVOTHROID) 125 MCG tablet Take 125 mcg by mouth daily before breakfast.     . memantine (NAMENDA) 10 MG tablet Take 1 tablet (10 mg total) by mouth 2 (two) times daily. 60 tablet 6  . metFORMIN (GLUCOPHAGE-XR) 500 MG 24 hr tablet Take 500 mg by mouth 2 (two) times daily.  1  . metoprolol tartrate (LOPRESSOR) 25 MG tablet Take 25 mg by mouth 2 (two) times daily.      . mirabegron ER (MYRBETRIQ) 50 MG TB24 Take 50 mg by mouth every evening.    . mupirocin ointment (BACTROBAN) 2 % Apply to each nostril BID x 5  days 22 g 0  . omeprazole (PRILOSEC) 20 MG capsule Take 20 mg by mouth daily.    . pioglitazone (ACTOS) 30 MG tablet Take 30 mg by mouth every morning.     . pravastatin (PRAVACHOL) 80 MG tablet take 1 tablet by mouth once daily 60 tablet 0  . prednisoLONE sodium phosphate (INFLAMASE FORTE) 1 % ophthalmic solution Place 1 drop into the right eye 3 (three) times daily.    . QUEtiapine (SEROQUEL) 25 MG tablet TAKE 1 TABLET BY MOUTH AT BEDTIME; MAY  REPEAT ONCE IF NEEDED 60 tablet 6  . silver sulfADIAZINE (SILVADENE) 1 % cream Apply 1 application topically daily. 50 g 0  . traMADol (ULTRAM) 50 MG tablet Take 1 tablet (50 mg total) by mouth every 6 (six) hours as needed for moderate pain. 30 tablet 0   No current facility-administered medications for this visit.     REVIEW OF SYSTEMS:  [X]  denotes positive finding, [ ]  denotes negative finding Cardiac  Comments:  Chest pain or chest pressure:    Shortness of breath upon exertion:    Short of breath when lying flat:    Irregular heart rhythm:        Vascular    Pain in calf, thigh, or hip brought on by ambulation:    Pain in feet at night that wakes you up from your sleep:     Blood clot in your veins:    Leg swelling:         Pulmonary    Oxygen at home:    Productive cough:     Wheezing:         Neurologic    Sudden weakness in arms or legs:     Sudden numbness in arms or legs:     Sudden onset of difficulty speaking or slurred speech:    Temporary loss of vision in one eye:     Problems with dizziness:         Gastrointestinal    Blood in stool:     Vomited blood:         Genitourinary    Burning when urinating:     Blood in urine:        Psychiatric    Major depression:         Hematologic    Bleeding problems:    Problems with blood clotting too easily:        Skin    Rashes or ulcers:        Constitutional    Fever or chills:      PHYSICAL EXAM: Vitals:   05/13/16 1401 05/13/16 1405 05/13/16 1406  BP:  132/66 (!) 150/75 139/66  Pulse: 70    Resp: 18    Temp: 99 F (37.2 C)    TempSrc: Oral    SpO2: 98%    Weight: 198 lb 11.2 oz (90.1 kg)    Height: 5\' 1"  (1.549 m)      GENERAL: The patient is a well-nourished female, in no acute distress. The vital signs are documented above. CARDIAC: Irregular rhythm, no carotid bruits. VASCULAR: 2+ radial pulses bilaterally. Nonpalpable pedal pulses. PULMONARY: There is good air exchange bilaterally without wheezing or rales. MUSCULOSKELETAL: There are no major deformities or cyanosis. NEUROLOGIC: Some numbness to feet bilaterally. SKIN: Small 2 mm ulceration to medial aspect of left great toe and dorsum of left fourth toe. PSYCHIATRIC: The patient has a normal affect.  DATA:  Carotid duplex 05/13/2016  Patent left carotid endarterectomy site. 40-59% right internal carotid artery stenosis. Vertebral arteries are patent with antegrade flow bilaterally.  MEDICAL ISSUES: Asymptomatic 40-59% right internal carotid artery stenosis Patent left carotid endarterectomy site  The patient has been asymptomatic without any TIA or stroke symptoms. She will follow up in 6 months with repeat carotid duplex. She is on maximal medical management with aspirin and a statin.  Peripheral arterial disease  The patient has 2 small superficial skin lesions on her left foot. She is being followed by Dr. Amalia Hailey with  podiatry. She is not at risk for limb loss. She states that these wounds are improving. She knows to return sooner if these wounds worsen.    Virgina Jock, PA-C [facility] [place]     I have examined the patient, reviewed and agree with above.  Curt Jews, MD 05/13/2016 4:06 PM

## 2016-05-13 NOTE — Progress Notes (Signed)
Vitals:   05/13/16 1401 05/13/16 1405 05/13/16 1406  BP: 132/66 (!) 150/75 139/66  Pulse: 70    Resp: 18    Temp: 99 F (37.2 C)    TempSrc: Oral    SpO2: 98%    Weight: 198 lb 11.2 oz (90.1 kg)    Height: 5\' 1"  (1.549 m)

## 2016-05-14 ENCOUNTER — Ambulatory Visit: Payer: Medicare Other | Admitting: Podiatry

## 2016-05-15 ENCOUNTER — Encounter: Payer: Self-pay | Admitting: *Deleted

## 2016-05-20 ENCOUNTER — Ambulatory Visit (INDEPENDENT_AMBULATORY_CARE_PROVIDER_SITE_OTHER): Payer: Medicare Other | Admitting: Podiatry

## 2016-05-20 ENCOUNTER — Encounter: Payer: Self-pay | Admitting: Podiatry

## 2016-05-20 VITALS — BP 116/54 | HR 85 | Temp 99.0°F | Resp 16

## 2016-05-20 DIAGNOSIS — L89891 Pressure ulcer of other site, stage 1: Secondary | ICD-10-CM | POA: Diagnosis not present

## 2016-05-20 DIAGNOSIS — L97521 Non-pressure chronic ulcer of other part of left foot limited to breakdown of skin: Secondary | ICD-10-CM

## 2016-05-20 NOTE — Patient Instructions (Addendum)
Apply Silvadene cream daily to the skin ulcers on the left big toe, fourth left toe, and the side of the left foot. Cover all these wounds with a small amount of gauze. Always wear the surgical shoe on the left foot If you develop any sudden pain, swelling, redness, fever in the left foot/leg present to the emergency department  Also, your complaining of soreness in the back your left heel without any open wounds. I suggested a heel protector to wear in the left heel at bedtime. These are meant to be worn only in a seated or lying down position and are not meant to walking. Another option is to place a pillow under your left calf to elevate your left leg      Diabetes and Foot Care Diabetes may cause you to have problems because of poor blood supply (circulation) to your feet and legs. This may cause the skin on your feet to become thinner, break easier, and heal more slowly. Your skin may become dry, and the skin may peel and crack. You may also have nerve damage in your legs and feet causing decreased feeling in them. You may not notice minor injuries to your feet that could lead to infections or more serious problems. Taking care of your feet is one of the most important things you can do for yourself.  HOME CARE INSTRUCTIONS  Wear shoes at all times, even in the house. Do not go barefoot. Bare feet are easily injured.  Check your feet daily for blisters, cuts, and redness. If you cannot see the bottom of your feet, use a mirror or ask someone for help.  Wash your feet with warm water (do not use hot water) and mild soap. Then pat your feet and the areas between your toes until they are completely dry. Do not soak your feet as this can dry your skin.  Apply a moisturizing lotion or petroleum jelly (that does not contain alcohol and is unscented) to the skin on your feet and to dry, brittle toenails. Do not apply lotion between your toes.  Trim your toenails straight across. Do not dig under  them or around the cuticle. File the edges of your nails with an emery board or nail file.  Do not cut corns or calluses or try to remove them with medicine.  Wear clean socks or stockings every day. Make sure they are not too tight. Do not wear knee-high stockings since they may decrease blood flow to your legs.  Wear shoes that fit properly and have enough cushioning. To break in new shoes, wear them for just a few hours a day. This prevents you from injuring your feet. Always look in your shoes before you put them on to be sure there are no objects inside.  Do not cross your legs. This may decrease the blood flow to your feet.  If you find a minor scrape, cut, or break in the skin on your feet, keep it and the skin around it clean and dry. These areas may be cleansed with mild soap and water. Do not cleanse the area with peroxide, alcohol, or iodine.  When you remove an adhesive bandage, be sure not to damage the skin around it.  If you have a wound, look at it several times a day to make sure it is healing.  Do not use heating pads or hot water bottles. They may burn your skin. If you have lost feeling in your feet or legs, you  may not know it is happening until it is too late.  Make sure your health care provider performs a complete foot exam at least annually or more often if you have foot problems. Report any cuts, sores, or bruises to your health care provider immediately. SEEK MEDICAL CARE IF:   You have an injury that is not healing.  You have cuts or breaks in the skin.  You have an ingrown nail.  You notice redness on your legs or feet.  You feel burning or tingling in your legs or feet.  You have pain or cramps in your legs and feet.  Your legs or feet are numb.  Your feet always feel cold. SEEK IMMEDIATE MEDICAL CARE IF:   There is increasing redness, swelling, or pain in or around a wound.  There is a red line that goes up your leg.  Pus is coming from a  wound.  You develop a fever or as directed by your health care provider.  You notice a bad smell coming from an ulcer or wound.   This information is not intended to replace advice given to you by your health care provider. Make sure you discuss any questions you have with your health care provider.   Document Released: 08/29/2000 Document Revised: 05/04/2013 Document Reviewed: 02/08/2013 Elsevier Interactive Patient Education [employer].

## 2016-05-20 NOTE — Progress Notes (Signed)
Patient ID: [name] [name], female   DOB: [birth year], 82 y.o.   MRN: [id number]    Subjective: As patient presents on 05/20/2016 with her driver present the treatment room. The left foot has no gauze dressings on the 3 wounds and patient is not wearing the surgical shoe on the left foot This patient presents today from the follow-up visit of 01/16/2016. Patient has a skin ulcer on the lateral aspect base fifth left metatarsal with initial symptoms in January 2016. Patient has had ongoing local wound care, applying Silvadene cream to this area. The skin ulcer has remained small and persistent over time. The cause of the skin lesions patient's peripheral arterial disease and has been managed by vascular surgeon with a history of vascular surgery on the left leg, SFA angioplasty in 2013. Patient has ongoing monitoring with vascular surgeon. Since the visit of 01/16/2016 patient has notice additional wounds on the left foot including left hallux in the dorsal aspect left foot. She's had no specific treatment for these new areas. She denies applying any Silvadene cream or even notify our office her vascular surgeon's office The patient has a friend present in the treatment room  Objective: Orientated 3  Vascular: No calf edema or calf tenderness bilaterally DP and PT pulses 0/4 bilaterally Capillary reflex delayed bilaterally  Neurological: Sensation to 10 g monofilament wire intact 2/5 bilaterally Vibratory sensation reactive bilaterally Ankle reflexes reactive bilaterally  Dermatological: 9mm skin lesion lateral base fifth metatarsal crusted that there remains open after debridement. There is mild erythema, edema out any drainage surrounding this area  New superficial skin ulcer 2 mm medial left hallux. The base appears granular. There is no surrounding erythema, edema, drainage  Dorsal fourth left MPJ has a crust after debridement debridement.here is a slight serous drainage without any  erythema, warmth or malodor from this area  Assessment: Diabetic Peripheral arterial disease Skin ulcerations 3 left foot without obvious clinical sign of infection Noncompliance patient without any dressings on the foot or wearing surgical shoe on the left foot    Plan: Today I reviewed the results of exam with patient and patient's friend in the treatment room. I made him aware that there were 3 open skin lesions  The ulcers were debrided and dressed with Silvadene. The patient's friend today is going to help patient dress the wounds with Silvadene and gauze dressings at least 3-4 times a week. Surgical shoe dispensed to wear and left foot  Reappoint 2 weeks or sooner if patient has a concern Patient was advised to observe these areas if she noted increaseinpain,swelling,redness,fevertopresenttotheemergencydepartment

## 2016-05-20 NOTE — Progress Notes (Signed)
   Subjective:    Patient ID: [name] [name], female    DOB: 02-Aug-1934, 82 y.o.   MRN: [id number]  HPI     Review of Systems     Objective:   Physical Exam        Assessment & Plan:

## 2016-06-02 ENCOUNTER — Encounter: Payer: Self-pay | Admitting: Interventional Cardiology

## 2016-06-02 ENCOUNTER — Ambulatory Visit (INDEPENDENT_AMBULATORY_CARE_PROVIDER_SITE_OTHER): Payer: Medicare Other | Admitting: Interventional Cardiology

## 2016-06-02 VITALS — BP 115/60 | HR 67 | Ht 61.0 in | Wt 195.0 lb

## 2016-06-02 DIAGNOSIS — I739 Peripheral vascular disease, unspecified: Secondary | ICD-10-CM | POA: Diagnosis not present

## 2016-06-02 DIAGNOSIS — I10 Essential (primary) hypertension: Secondary | ICD-10-CM | POA: Diagnosis not present

## 2016-06-02 DIAGNOSIS — I4891 Unspecified atrial fibrillation: Secondary | ICD-10-CM

## 2016-06-02 DIAGNOSIS — I2510 Atherosclerotic heart disease of native coronary artery without angina pectoris: Secondary | ICD-10-CM | POA: Diagnosis not present

## 2016-06-02 MED ORDER — APIXABAN 5 MG PO TABS: 5.0000 mg | ORAL_TABLET | Freq: Two times a day (BID) | ORAL | 3 refills | Status: DC

## 2016-06-02 NOTE — Patient Instructions (Signed)
**Note De-Identified  Obfuscation** Medication Instructions:  Stop taking Aspirin. Start taking Eliquis 5 mg twice daily. All other medications remain the same.  Labwork: None  Testing/Procedures: None  Follow-Up: In 1 month with an Extender (APP).  Any Other Special Instructions Will Be Listed Below (If Applicable).     If you need a refill on your cardiac medications before your next appointment, please call your pharmacy.

## 2016-06-02 NOTE — Progress Notes (Signed)
Patient ID: [name] [name], female   DOB: 1933/10/24, 82 y.o.   MRN: [id number]     Cardiology Office Note   Date:  06/02/2016   ID:  [name] [name], DOB 09/19/1933, MRN [id number]  PCP:  [name] Argyle, MD  Cardiologist:  [name] Grooms, MD   No chief complaint on file. f/u CAD    History of Present Illness: [name] [name] is a 82 y.o. female who is status post CABG in 0000000 complicated by postoperative atrial fibrillation, HTN, HL, diabetes, GERD, carotid stenosis, PAD status post angioplasty to the left SFA in 02/2012 for non healing foot ulcer, dementia.  She had  A negative stress test in late 2015.  She has had some intermittent CP not related to exertion.  She walks slowly with a cane.  She notes that her memory is getting worse.   She follows with [name] [name] for carotid disease.   She has not been checking BP regularly outside of MD office.   Since the last visit, she had an arteriogram sowing an occluded left SFA.  This was conservatively managed.   Left leg ulcer healed.  No recent chest pain.      Past Medical History:  Diagnosis Date  . Anemia   . Anxiety   . Atrial fibrillation ([place])   . Bilateral carotid artery stenosis   . Bowel incontinence   . CAD (coronary artery disease)   . Chronic lower back pain    "back is very stiff because of fusion; can't stand up straight more than few moments"  . Closed fracture of unspecified part of neck of femur   . Complication of anesthesia    anesth. meds caused hallucinations   . Cough    no fever,   . Degenerative joint disease   . Depression   . Depressive disorder, not elsewhere classified   . Diverticulitis   . Endometriosis   . Fall at home 05/15/2013  . Female stress incontinence   . GERD (gastroesophageal reflux disease)   . Headache   . Hiatal hernia   . HLD (hyperlipidemia)   . Hx of cardiovascular stress test    Lexiscan Myoview (11/15):  Low risk stress nuclear study with a small, mild fixed  apical septal perfusion defect. EF is 52% with mild septal hypokinesis. No ischemia.  [name] Hx of echocardiogram    Echo (11/15):  Mod LVH, severe septal hypertrophy (suggestive HCM), no LVOT obstruction, rest gradient 15 mmHg across AV, vigorous LVF, EF 65-70%, no RWMA, Gr 1 DD, Ao sclerosis (no stenosis), mod LAE, mild RAE, mod TR, PASP 42 mmHg  . Hypertension   . Hypertonicity of bladder   . Hypertrophic cardiomyopathy ([place])   . Hypothyroidism   . Lumbago   . Muscle weakness (generalized)   . Obesity   . Osteoarthrosis, unspecified whether generalized or localized, unspecified site   . Progressive dementia with uncertain etiology [id number]   Suspect Lewy body .  [name] Reflux esophagitis   . Stroke [facility]) 2011   "old very small TIA/neurologist"  . Type II diabetes mellitus ([place])   . Unspecified glaucoma     Past Surgical History:  Procedure Laterality Date  . BACK SURGERY     X2  . CARPAL TUNNEL RELEASE Bilateral   . CATARACT EXTRACTION W/ INTRAOCULAR LENS  IMPLANT, BILATERAL    . CHOLECYSTECTOMY    . CORONARY ARTERY BYPASS GRAFT  04/17/2011   LT  internal mammary artery to left anterior descending,  saphenous vein graft first diagonal, saphenous  vein graft to obtuse marginal 1, saphenous vein graft to posterior descending  . DILATION AND CURETTAGE OF UTERUS     "for endometrosis"  . DILATION AND CURETTAGE OF UTERUS     ENDOMETRIOSIS  . ENDARTERECTOMY Left 10/01/2015   Procedure: Left CAROTID Endartarectomy;  Surgeon: Rosetta Posner, MD;  Location: [place];  Service: Vascular;  Laterality: Left;  . HAND SURGERY     "for arthritis; right hand"  . JOINT REPLACEMENT    . LASER PHOTO ABLATION Right 01/20/2013   Procedure: LASER PHOTO ABLATION;  Surgeon: Hayden Pedro, MD;  Location: [place];  Service: Ophthalmology;  Laterality: Right;  . LOWER EXTREMITY ANGIOGRAM N/A 03/10/2012   Procedure: LOWER EXTREMITY ANGIOGRAM;  Surgeon: Jettie Booze, MD;  Location: [facility] CATH LAB;  Service:  Cardiovascular;  Laterality: N/A;  . NEUROPLASTY / TRANSPOSITION MEDIAN NERVE AT CARPAL TUNNEL BILATERAL    . PARTIAL HIP ARTHROPLASTY  11/2009   left; "just the ball"  . PERIPHERAL VASCULAR CATHETERIZATION N/A 05/16/2015   Procedure: Abdominal Aortogram;  Surgeon: Elam Dutch, MD;  Location: [place] CV LAB;  Service: Cardiovascular;  Laterality: N/A;  . PERIPHERAL VASCULAR CATHETERIZATION Left 05/16/2015   Procedure: Lower Extremity Angiography;  Surgeon: Elam Dutch, MD;  Location: [place] CV LAB;  Service: Cardiovascular;  Laterality: Left;  . POSTERIOR FUSION LUMBAR SPINE     "removed L4-5, S1"  . PTCA  03/10/12   LLE  . RETINAL DETACHMENT SURGERY Right May 2014  . SCLERAL BUCKLE Right 01/20/2013   Procedure: SCLERAL BUCKLE;  Surgeon: Hayden Pedro, MD;  Location: [place];  Service: Ophthalmology;  Laterality: Right;  . TOTAL KNEE ARTHROPLASTY  ~ 2004   right  . TOTAL SHOULDER ARTHROPLASTY Right 10/07/2013   Procedure: TOTAL SHOULDER ARTHROPLASTY;  Surgeon: Alta Corning, MD;  Location: [place];  Service: Orthopedics;  Laterality: Right;     Current Outpatient Prescriptions  Medication Sig Dispense Refill  . acetaminophen (TYLENOL) 500 MG tablet Take 500 mg by mouth daily.     . Ascorbic Acid (VITAMIN C) 1000 MG tablet Take 1,000 mg by mouth 3 (three) times a week.     [name] aspirin EC 81 MG tablet Take 81 mg by mouth daily.    . cholecalciferol (VITAMIN D) 1000 UNITS tablet Take 1,000 Units by mouth See admin instructions. On Monday, Tuesday, Wednesday, Friday, Saturday.    . COMBIGAN 0.2-0.5 % ophthalmic solution Place 1-2 drops into both eyes daily.   0  . donepezil (ARICEPT) 5 MG tablet Take 1 tablet (5 mg total) by mouth at bedtime. 30 tablet 5  . ergocalciferol (VITAMIN D2) 50000 UNITS capsule Take 50,000 Units by mouth 2 (two) times a week. On Thursday and Sunday    . FLUoxetine (PROZAC) 20 MG capsule Take 20 mg by mouth every morning.     [name] glipiZIDE (GLUCOTROL XL) 5  MG 24 hr tablet Take 5 mg by mouth at bedtime.     . Insulin Glargine (LANTUS SOLOSTAR) 100 UNIT/ML SOPN Inject 10 Units into the skin at bedtime. Patient states she forgot to take her insulin injection last night before bedtime.    [name] levothyroxine (SYNTHROID, LEVOTHROID) 125 MCG tablet Take 125 mcg by mouth daily before breakfast.     . losartan (COZAAR) 25 MG tablet Take 25 mg by mouth daily.   0  . memantine (NAMENDA) 10 MG tablet Take 1 tablet (10 mg total)  by mouth 2 (two) times daily. 60 tablet 6  . metFORMIN (GLUCOPHAGE-XR) 500 MG 24 hr tablet Take 500 mg by mouth 2 (two) times daily.  1  . metoprolol tartrate (LOPRESSOR) 25 MG tablet Take 25 mg by mouth 2 (two) times daily.      . mirabegron ER (MYRBETRIQ) 50 MG TB24 Take 50 mg by mouth every evening.    . mupirocin ointment (BACTROBAN) 2 % Apply to each nostril BID x 5 days 22 g 0  . omeprazole (PRILOSEC) 20 MG capsule Take 20 mg by mouth daily.    . pioglitazone (ACTOS) 30 MG tablet Take 30 mg by mouth every morning.     . pravastatin (PRAVACHOL) 80 MG tablet take 1 tablet by mouth once daily 60 tablet 0  . prednisoLONE sodium phosphate (INFLAMASE FORTE) 1 % ophthalmic solution Place 1 drop into the right eye 3 (three) times daily.    . silver sulfADIAZINE (SILVADENE) 1 % cream Apply 1 application topically daily. 50 g 0  . traMADol (ULTRAM) 50 MG tablet Take 1 tablet (50 mg total) by mouth every 6 (six) hours as needed for moderate pain. 30 tablet 0   No current facility-administered medications for this visit.     Allergies:   Amitriptyline; Vesicare [solifenacin]; Vicodin [hydrocodone-acetaminophen]; and Polysaccharide iron complex    Social History:  The patient  reports that she quit smoking about 28 years ago. Her smoking use included Cigarettes. She smoked 1.50 packs per day. She has never used smokeless tobacco. She reports that she does not drink alcohol or use drugs.   Family History:  The patient's family history includes  Cancer in her cousin and paternal aunt; Heart attack in her father and mother; Heart disease in her father; Hyperlipidemia in her father; Hypertension in her father and mother.    ROS:  Please see the history of present illness.   Otherwise, review of systems are positive for arthritic joint pain- limits walking.   All other systems are reviewed and negative.    PHYSICAL EXAM: VS:  BP 115/60   Pulse 67   Ht 5\' 1"  (1.549 m)   Wt 195 lb (88.5 kg)   BMI 36.84 kg/m  , BMI Body mass index is 36.84 kg/m. GEN: Well nourished, well developed, in no acute distress  HEENT: normal  Neck: no JVD, carotid bruits, or masses Cardiac: *RRR; no murmurs, rubs, or gallops,no edema , diminished pedal pulses; dry skin area on the left foot Respiratory:  clear to auscultation bilaterally, normal work of breathing GI: soft, nontender, nondistended, + BS MS: no deformity or atrophy  Skin: warm and dry, no rash, dry skin on side of left foot Neuro:  Strength and sensation are intact Psych: euthymic mood, full affect   EKG:  EKG  ordered today, AFib, rate controlled..    Recent Labs: 09/28/2015: ALT 19 10/02/2015: BUN 15; Creatinine, Ser 1.02; Hemoglobin 11.7; Platelets 180; Potassium 4.1; Sodium 141    Lipid Panel    Component Value Date/Time   CHOL 157 06/26/2014 1132   TRIG 250.0 (H) 06/26/2014 1132   HDL 30.80 (L) 06/26/2014 1132   CHOLHDL 5 06/26/2014 1132   VLDL 50.0 (H) 06/26/2014 1132   LDLCALC 123 (H) 03/20/2014 1107   LDLDIRECT 73.3 06/26/2014 1132      Wt Readings from Last 3 Encounters:  06/02/16 195 lb (88.5 kg)  05/13/16 198 lb 11.2 oz (90.1 kg)  02/12/16 193 lb (87.5 kg)  Other studies Reviewed: Additional studies/ records that were reviewed today include: Cardiolite: no ischemia- 2015   ASSESSMENT AND PLAN:  1. Atrial fibrilllation: Unknown onset.  Stop aspirin.   Start Eliquis.  Rate controlled on current meds.  Discussed Risks and benefits of anticoagulation for  stroke prevention. She understands she has to be very careful to avoid falls.  Renal function is normal. Weight is high enough that she would need 5 mg twice a day of Eliquis.   2 Essential hypertension, benign  Continue Metoprolol Tartrate Tablet, 25 MG, take 1 tablet by mouth twice a day for hypertension Increased Felodipine Tablet Extended Release 24 Hour, 20 MG, 1 tablet, Orally, Once a day Notes: Elevated today. continue to follow blood pressure at home. If She continues to get elevated readings over 150/90, would have to adjust medication further. Next BP check with PMD.  SHe can call us with home results.   3. Coronary artery disease  Notes: No angina. status post bypass surgery in 2012. Her coronary artery disease was discovered during part of her preoperative workup for shoulder surgery.  Negative stress test in 2015.   4. Peripheral Vascular Disease  Notes: Left SFA angioplasty for foot ulcer in 2013. Ulcer has healed but she has some area of dry skin on the left side of the foot. No breakdown of skin apparent.  Left SFA occluded by 2016 angiogram.  Medical therapy per [name] [name].  5. Hyperlipidemia: LDL 132 in 12/14. Increased pravastatin to 80 mg daily. Better control. In 10/15, LDL down to 73.  Checked most recently with Dr. Felipa Eth. Preventive Medicine  Adult topics discussed:  Diet: healthy diet.  Exercise: 5 days a week, at least 30 minutes of aerobic exercise. Avoid falling            Current medicines are reviewed at length with the patient today.  The patient has concerns regarding medicines.  The following changes have been made:  Increase felodipine   Labs/ tests ordered today include: none No orders of the defined types were placed in this encounter.    Disposition:   FU with me in 1 year   Signed, [name] Grooms, MD  06/02/2016 12:16 PM    [facility] [address], [place], [place]  [address] Phone: [phone];  Fax: [phone]

## 2016-06-03 ENCOUNTER — Ambulatory Visit (INDEPENDENT_AMBULATORY_CARE_PROVIDER_SITE_OTHER): Payer: Medicare Other | Admitting: Podiatry

## 2016-06-03 ENCOUNTER — Telehealth: Payer: Self-pay

## 2016-06-03 ENCOUNTER — Telehealth: Payer: Self-pay | Admitting: Interventional Cardiology

## 2016-06-03 ENCOUNTER — Encounter: Payer: Self-pay | Admitting: Podiatry

## 2016-06-03 VITALS — BP 111/60 | HR 86 | Resp 14

## 2016-06-03 DIAGNOSIS — L97521 Non-pressure chronic ulcer of other part of left foot limited to breakdown of skin: Secondary | ICD-10-CM

## 2016-06-03 DIAGNOSIS — L89891 Pressure ulcer of other site, stage 1: Secondary | ICD-10-CM

## 2016-06-03 NOTE — Progress Notes (Signed)
Patient ID: [name] [name], female   DOB: 10/25/1933, 82 y.o.   MRN: [id number]  Subjective: Patient presents for ongoing local wound care for skin ulcers on the left foot with initial systems on January 2016. Patient has a history of peripheral arterial disease managed by vascular surgeon with a history of vascular surgery on the left leg in 2013 and ongoing surveillance by the vascular surgeon. Patient over this time is also develop 2 smaller skin ulcers on the left foot.  Objective: Pleasant orientated 3 Patient wearing surgical shoe on left foot  Vascular: No calf edema or calf tenderness bilaterally DP and PT pulses 0/4 bilaterally Capillary reflex delayed bilaterally  Neurological: Sensation to 10 g monofilament wire intact 2/5 bilaterally Vibratory sensation reactive bilaterally Ankle reflexes reactive bilaterally  Dermatological: 28mm (previous size 5 mm) skin lesion lateral base fifth metatarsal crusted that there remains open after debridement. There is mild erythema, edema out any drainage surrounding this area  New superficial skin ulcer 1 mm (previous size 2 mm) medial left hallux. The base appears granular. There is no surrounding erythema, edema, drainage  Dorsal fourth left MPJ has a crust after debridement debridement.here is a slight serous drainage without any erythema, warmth or malodor from this area with a 2 mm superficial ulce   Assessment: Diabetic Peripheral arterial disease Skin ulcerations 3 left foot without obvious clinical sign of infection  Plan: Debride superficial ulcers 3 Apply Silvadene cream to all 3 ulcer sites in the left foot daily and cover with gauze Wear the surgical shoe on left foot  Patient instructed that if she knows any sudden increase in pain, swelling, redness, fever to present to the emergency department  Reappoint 2 weeks

## 2016-06-03 NOTE — Patient Instructions (Signed)
Apply Silvadene cream to the skin ulcers on your left great toe, fourth left toe and the side of your left foot daily and cover with gauze Wear the surgical shoe on your left foot If you notice sudden deterioration redness, swelling, redness, drainage, warmth, fever present to the emergency department  Diabetes and Foot Care Diabetes may cause you to have problems because of poor blood supply (circulation) to your feet and legs. This may cause the skin on your feet to become thinner, break easier, and heal more slowly. Your skin may become dry, and the skin may peel and crack. You may also have nerve damage in your legs and feet causing decreased feeling in them. You may not notice minor injuries to your feet that could lead to infections or more serious problems. Taking care of your feet is one of the most important things you can do for yourself.  HOME CARE INSTRUCTIONS  Wear shoes at all times, even in the house. Do not go barefoot. Bare feet are easily injured.  Check your feet daily for blisters, cuts, and redness. If you cannot see the bottom of your feet, use a mirror or ask someone for help.  Wash your feet with warm water (do not use hot water) and mild soap. Then pat your feet and the areas between your toes until they are completely dry. Do not soak your feet as this can dry your skin.  Apply a moisturizing lotion or petroleum jelly (that does not contain alcohol and is unscented) to the skin on your feet and to dry, brittle toenails. Do not apply lotion between your toes.  Trim your toenails straight across. Do not dig under them or around the cuticle. File the edges of your nails with an emery board or nail file.  Do not cut corns or calluses or try to remove them with medicine.  Wear clean socks or stockings every day. Make sure they are not too tight. Do not wear knee-high stockings since they may decrease blood flow to your legs.  Wear shoes that fit properly and have enough  cushioning. To break in new shoes, wear them for just a few hours a day. This prevents you from injuring your feet. Always look in your shoes before you put them on to be sure there are no objects inside.  Do not cross your legs. This may decrease the blood flow to your feet.  If you find a minor scrape, cut, or break in the skin on your feet, keep it and the skin around it clean and dry. These areas may be cleansed with mild soap and water. Do not cleanse the area with peroxide, alcohol, or iodine.  When you remove an adhesive bandage, be sure not to damage the skin around it.  If you have a wound, look at it several times a day to make sure it is healing.  Do not use heating pads or hot water bottles. They may burn your skin. If you have lost feeling in your feet or legs, you may not know it is happening until it is too late.  Make sure your health care provider performs a complete foot exam at least annually or more often if you have foot problems. Report any cuts, sores, or bruises to your health care provider immediately. SEEK MEDICAL CARE IF:   You have an injury that is not healing.  You have cuts or breaks in the skin.  You have an ingrown nail.  You  notice redness on your legs or feet.  You feel burning or tingling in your legs or feet.  You have pain or cramps in your legs and feet.  Your legs or feet are numb.  Your feet always feel cold. SEEK IMMEDIATE MEDICAL CARE IF:   There is increasing redness, swelling, or pain in or around a wound.  There is a red line that goes up your leg.  Pus is coming from a wound.  You develop a fever or as directed by your health care provider.  You notice a bad smell coming from an ulcer or wound.   This information is not intended to replace advice given to you by your health care provider. Make sure you discuss any questions you have with your health care provider.   Document Released: 08/29/2000 Document Revised: 05/04/2013  Document Reviewed: 02/08/2013 Elsevier Interactive Patient Education [employer].

## 2016-06-03 NOTE — Telephone Encounter (Signed)
Hold one dose of Eliquis.  THen resume it.  Discontinue aspirin.

## 2016-06-03 NOTE — Telephone Encounter (Signed)
[name] [name] is calling because she started on Eliquis on yesterday 06/02/16 and after she took it she had a strong nose bleed and is concerned that the medication made that happen , She states that she never have nose bleeds . Please call   Thanks

## 2016-06-03 NOTE — Telephone Encounter (Signed)
Prior auth for Eliquis 5 mg submitted to [facility]. 

## 2016-06-03 NOTE — Telephone Encounter (Signed)
Pt states she took an Eliquis shortly after leaving the office yesterday around noon and then took another at night along with ASA and had a nose bleed just before bed.  Pt states it was a moderate amount of bleeding lasting 5 mins.  Took Eliquis this morning with no bleeding noted.  Daughter fixes meds in pill box weekly and pt has not removed ASA at this time as she is unsure which med is the ASA.  Advised pt to contact daughter so that ASA can be removed since it was d/c'ed at [facility] yesterday.  Pt has had no bleeding issues at all today.  Advised pt to take night dose of Eliquis as planned but no ASA.  Advised if another nose bleed occurs to please let us know and that I will send this message to Dr. Irish Lack for review and we will call back if any further recommendations.  Pt verbalized understanding and was in agreement with this plan.

## 2016-06-04 NOTE — Telephone Encounter (Signed)
Spoke with pt and advised her of recommendations per Dr. Irish Lack.  Pt verbalized understanding and was in agreement with this plan.  Pt states daughter removed ASA from pill box.

## 2016-06-09 ENCOUNTER — Telehealth: Payer: Self-pay | Admitting: Interventional Cardiology

## 2016-06-09 NOTE — Telephone Encounter (Signed)
New message       Pt c/o medication issue:  1. Name of Medication: eliquis 2. How are you currently taking this medication (dosage and times per day)? 5mg  3. Are you having a reaction (difficulty breathing--STAT)? no  4. What is your medication issue? Have questions to ask the nurse about this medication

## 2016-06-09 NOTE — Telephone Encounter (Signed)
Daughter, [name] calling.  Do not have DPR on file.  She states she is POA for Ms. [name] [name].  Called [name] who gave permission to speak w/her daughter, Ledon Snare wanted to discuss why she needed to be on Eliquis instead of just an ASA.  Advised that ASA did not prevent the effectiveness of being on Eliquis for stroke prevention. States her mother had an episode last week of nose bleed but was controlled after couple of minutes.  Advised that she may experience nose bleeds and /or bruising if hits arm or leg on an object. She verbalizes understanding and will convey to her mother.

## 2016-06-11 ENCOUNTER — Other Ambulatory Visit: Payer: Self-pay | Admitting: Interventional Cardiology

## 2016-06-12 ENCOUNTER — Telehealth: Payer: Self-pay

## 2016-06-12 NOTE — Telephone Encounter (Signed)
Prior auth for Eliquis 5 mg obtained from [place]- MD:[id number]. Good through 09/14/2016.

## 2016-06-17 ENCOUNTER — Ambulatory Visit (INDEPENDENT_AMBULATORY_CARE_PROVIDER_SITE_OTHER): Payer: Medicare Other | Admitting: Podiatry

## 2016-06-17 ENCOUNTER — Encounter: Payer: Self-pay | Admitting: Podiatry

## 2016-06-17 VITALS — BP 100/56 | HR 66 | Resp 14

## 2016-06-17 DIAGNOSIS — L89891 Pressure ulcer of other site, stage 1: Secondary | ICD-10-CM

## 2016-06-17 DIAGNOSIS — L97521 Non-pressure chronic ulcer of other part of left foot limited to breakdown of skin: Secondary | ICD-10-CM

## 2016-06-17 NOTE — Progress Notes (Signed)
Patient ID: [name] [name], female   DOB: Dec 01, 1933, 82 y.o.   MRN: [id number]    Subjective: Patient presents for ongoing local wound care for skin ulcers on the left foot with initial systems on January 2016. Patient has a history of peripheral arterial disease managed by vascular surgeon with a history of vascular surgery on the left leg in 2013 and ongoing surveillance by the vascular surgeon. Patient over this time is also develop 2 smaller skin ulcers on the left foot. Patient has a friend in the treatment room  Objective: Pleasant orientated 3 Patient wearing surgical shoe on left foot  Vascular: No calf edema or calf tenderness bilaterally DP and PT pulses 0/4 bilaterally Capillary reflex delayed bilaterally  Neurological: Sensation to 10 g monofilament wire intact 2/5 bilaterally Vibratory sensation reactive bilaterally Ankle reflexes reactive bilaterally  Dermatological: 66mm (previous size 5 mm) skin lesion lateral base fifth metatarsal crusted that there remains open after debridement. There is no erythema, edema out anydrainage surrounding this area.   New superficial skin ulcer 63mm (previous size 2 mm) medial left hallux. The base appears granular. There is no surrounding erythema, edema, drainage  Dorsal fourth left MPJ  has a superficial 2 mm ulcer with a moist base. There is no surrounding erythema, edema, drainage, warmth from the wound  Assessment: Diabetic Peripheral arterial disease Skin ulcerations 3 left foot without obvious clinical sign of infection  Plan: Debride superficial ulcers 3 Apply Silvadene cream to all 3 ulcer sites in the left foot daily and cover with gauze Wear the surgical shoe on left foot  Patient instructed that if she knows any sudden increase in pain, swelling, redness, fever to present to the emergency department  Reappoint 2 weeks

## 2016-06-17 NOTE — Patient Instructions (Signed)
Apply a small amount of Silvadene cream to the skin ulcer on your left big toe, fourth left toe and the side of your left foot daily and cover with gauze Wear the surgical shoe on the left foot If you develop any sudden increase in pain, swelling, redness, warmth, fever presents to the emergency department  Diabetes and Foot Care Diabetes may cause you to have problems because of poor blood supply (circulation) to your feet and legs. This may cause the skin on your feet to become thinner, break easier, and heal more slowly. Your skin may become dry, and the skin may peel and crack. You may also have nerve damage in your legs and feet causing decreased feeling in them. You may not notice minor injuries to your feet that could lead to infections or more serious problems. Taking care of your feet is one of the most important things you can do for yourself.  HOME CARE INSTRUCTIONS  Wear shoes at all times, even in the house. Do not go barefoot. Bare feet are easily injured.  Check your feet daily for blisters, cuts, and redness. If you cannot see the bottom of your feet, use a mirror or ask someone for help.  Wash your feet with warm water (do not use hot water) and mild soap. Then pat your feet and the areas between your toes until they are completely dry. Do not soak your feet as this can dry your skin.  Apply a moisturizing lotion or petroleum jelly (that does not contain alcohol and is unscented) to the skin on your feet and to dry, brittle toenails. Do not apply lotion between your toes.  Trim your toenails straight across. Do not dig under them or around the cuticle. File the edges of your nails with an emery board or nail file.  Do not cut corns or calluses or try to remove them with medicine.  Wear clean socks or stockings every day. Make sure they are not too tight. Do not wear knee-high stockings since they may decrease blood flow to your legs.  Wear shoes that fit properly and have  enough cushioning. To break in new shoes, wear them for just a few hours a day. This prevents you from injuring your feet. Always look in your shoes before you put them on to be sure there are no objects inside.  Do not cross your legs. This may decrease the blood flow to your feet.  If you find a minor scrape, cut, or break in the skin on your feet, keep it and the skin around it clean and dry. These areas may be cleansed with mild soap and water. Do not cleanse the area with peroxide, alcohol, or iodine.  When you remove an adhesive bandage, be sure not to damage the skin around it.  If you have a wound, look at it several times a day to make sure it is healing.  Do not use heating pads or hot water bottles. They may burn your skin. If you have lost feeling in your feet or legs, you may not know it is happening until it is too late.  Make sure your health care provider performs a complete foot exam at least annually or more often if you have foot problems. Report any cuts, sores, or bruises to your health care provider immediately. SEEK MEDICAL CARE IF:   You have an injury that is not healing.  You have cuts or breaks in the skin.  You have an  ingrown nail.  You notice redness on your legs or feet.  You feel burning or tingling in your legs or feet.  You have pain or cramps in your legs and feet.  Your legs or feet are numb.  Your feet always feel cold. SEEK IMMEDIATE MEDICAL CARE IF:   There is increasing redness, swelling, or pain in or around a wound.  There is a red line that goes up your leg.  Pus is coming from a wound.  You develop a fever or as directed by your health care provider.  You notice a bad smell coming from an ulcer or wound.   This information is not intended to replace advice given to you by your health care provider. Make sure you discuss any questions you have with your health care provider.   Document Released: 08/29/2000 Document Revised:  05/04/2013 Document Reviewed: 02/08/2013 Elsevier Interactive Patient Education [employer].

## 2016-06-18 ENCOUNTER — Other Ambulatory Visit: Payer: Self-pay | Admitting: Interventional Cardiology

## 2016-06-19 NOTE — Telephone Encounter (Signed)
Pt's Rx was sent to pt's pharmacy as requested. Confirmation received.  °

## 2016-06-19 NOTE — Telephone Encounter (Signed)
**Note De-Identified  Obfuscation** Ok to fill Zetia 10 mg daily Thanks.

## 2016-06-19 NOTE — Telephone Encounter (Signed)
Pharmacy requesting a refill on Ezetimibe 10 mg tablet. This medication is not on pt's med list now and there is no documentation why the medication was D/C. Please advise

## 2016-06-23 ENCOUNTER — Encounter: Payer: Self-pay | Admitting: Physician Assistant

## 2016-07-02 ENCOUNTER — Encounter: Payer: Self-pay | Admitting: Podiatry

## 2016-07-02 ENCOUNTER — Ambulatory Visit (INDEPENDENT_AMBULATORY_CARE_PROVIDER_SITE_OTHER): Payer: Medicare Other | Admitting: Podiatry

## 2016-07-02 VITALS — BP 140/65 | HR 72 | Temp 97.9°F | Resp 20

## 2016-07-02 DIAGNOSIS — L97521 Non-pressure chronic ulcer of other part of left foot limited to breakdown of skin: Secondary | ICD-10-CM

## 2016-07-02 DIAGNOSIS — L89891 Pressure ulcer of other site, stage 1: Secondary | ICD-10-CM | POA: Diagnosis not present

## 2016-07-02 DIAGNOSIS — E1151 Type 2 diabetes mellitus with diabetic peripheral angiopathy without gangrene: Secondary | ICD-10-CM

## 2016-07-02 MED ORDER — SILVER SULFADIAZINE 1 % EX CREA: 1.0000 "application " | TOPICAL_CREAM | Freq: Every day | CUTANEOUS | 0 refills | Status: DC

## 2016-07-02 NOTE — Progress Notes (Signed)
   Subjective:    Patient ID: [name] [name], female    DOB: 01-Sep-1934, 82 y.o.   MRN: [id number]  HPI "It's doing fine.    Review of Systems     Objective:   Physical Exam        Assessment & Plan:

## 2016-07-02 NOTE — Progress Notes (Signed)
Patient ID: [name] [name], female   DOB: Sep 15, 1934, 82 y.o.   MRN: [id number]     Subjective: Patient presents for ongoing local wound care for skin ulcers on the left foot with initial systems on January 2016. Patient has a history of peripheral arterial disease managed by vascular surgeon with a history of vascular surgery on the left leg in 2013 and ongoing surveillance by the vascular surgeon. Patient over this time is also develop 2 smaller skin ulcers on the left foot. Patient has a friend, Denyse Amass in the treatment room  Objective: Pleasant orientated 3 Patient is not wearing surgical shoe on left foot and there are no gauze dressings over the wounds on the left foot  Vascular: No calf edema or calf tenderness bilaterally DP and PT pulses 0/4 bilaterally Capillary reflex delayed bilaterally  Neurological: Sensation to 10 g monofilament wire intact 2/5 bilaterally Vibratory sensation reactive bilaterally Ankle reflexes reactive bilaterally  Dermatological: 58mm (previous size 5 mm) skin lesion lateral base fifth metatarsal crusted that there remains open after debridement. There is no erythema, edema out anydrainage surrounding this area.   New superficial skin ulcer 23mm(previous size 2 mm)medial left hallux. The base appears granular/dry. There is no surrounding erythema, edema, drainage  Dorsal fourth left MPJ  has a superficial 2 mm ulcer with a moist base. There is a low-grade erythema without any active drainage, malodor, warmth  Assessment: Noncompliance of patient today not wearing a surgical shoe and no dressings over the wounds Diabetic Peripheral arterial disease Skin ulcerations 3 left foot without obvious clinical sign of infection  Plan: Debride superficial ulcers 3 Apply Silvadene cream to all 3 ulcer sites in the left foot daily and cover with gauze. Patient with a friend in room is instructed to apply Silvadene cream to the 3 skin ulcers on the left  foot and cover with gauze daily. Wear the surgical shoe on left foot  Patient instructed that if she knows any sudden increase in pain, swelling, redness, fever to present to the emergency department  Reappoint 2 weeks

## 2016-07-02 NOTE — Patient Instructions (Signed)
Continue to apply Silvadene cream to the skin ulcers on the left great toe, fourth left toe, and the side base of the fifth toe area cover with gauze Continue wear surgical shoe on left foot at all times If you notice any sudden increase in pain, swelling, redness, fever present to the emergency department  Diabetes and Foot Care Diabetes may cause you to have problems because of poor blood supply (circulation) to your feet and legs. This may cause the skin on your feet to become thinner, break easier, and heal more slowly. Your skin may become dry, and the skin may peel and crack. You may also have nerve damage in your legs and feet causing decreased feeling in them. You may not notice minor injuries to your feet that could lead to infections or more serious problems. Taking care of your feet is one of the most important things you can do for yourself.  HOME CARE INSTRUCTIONS  Wear shoes at all times, even in the house. Do not go barefoot. Bare feet are easily injured.  Check your feet daily for blisters, cuts, and redness. If you cannot see the bottom of your feet, use a mirror or ask someone for help.  Wash your feet with warm water (do not use hot water) and mild soap. Then pat your feet and the areas between your toes until they are completely dry. Do not soak your feet as this can dry your skin.  Apply a moisturizing lotion or petroleum jelly (that does not contain alcohol and is unscented) to the skin on your feet and to dry, brittle toenails. Do not apply lotion between your toes.  Trim your toenails straight across. Do not dig under them or around the cuticle. File the edges of your nails with an emery board or nail file.  Do not cut corns or calluses or try to remove them with medicine.  Wear clean socks or stockings every day. Make sure they are not too tight. Do not wear knee-high stockings since they may decrease blood flow to your legs.  Wear shoes that fit properly and have  enough cushioning. To break in new shoes, wear them for just a few hours a day. This prevents you from injuring your feet. Always look in your shoes before you put them on to be sure there are no objects inside.  Do not cross your legs. This may decrease the blood flow to your feet.  If you find a minor scrape, cut, or break in the skin on your feet, keep it and the skin around it clean and dry. These areas may be cleansed with mild soap and water. Do not cleanse the area with peroxide, alcohol, or iodine.  When you remove an adhesive bandage, be sure not to damage the skin around it.  If you have a wound, look at it several times a day to make sure it is healing.  Do not use heating pads or hot water bottles. They may burn your skin. If you have lost feeling in your feet or legs, you may not know it is happening until it is too late.  Make sure your health care provider performs a complete foot exam at least annually or more often if you have foot problems. Report any cuts, sores, or bruises to your health care provider immediately. SEEK MEDICAL CARE IF:   You have an injury that is not healing.  You have cuts or breaks in the skin.  You have an ingrown  nail.  You notice redness on your legs or feet.  You feel burning or tingling in your legs or feet.  You have pain or cramps in your legs and feet.  Your legs or feet are numb.  Your feet always feel cold. SEEK IMMEDIATE MEDICAL CARE IF:   There is increasing redness, swelling, or pain in or around a wound.  There is a red line that goes up your leg.  Pus is coming from a wound.  You develop a fever or as directed by your health care provider.  You notice a bad smell coming from an ulcer or wound.   This information is not intended to replace advice given to you by your health care provider. Make sure you discuss any questions you have with your health care provider.   Document Released: 08/29/2000 Document Revised:  05/04/2013 Document Reviewed: 02/08/2013 Elsevier Interactive Patient Education [employer].

## 2016-07-03 ENCOUNTER — Encounter: Payer: Self-pay | Admitting: Physician Assistant

## 2016-07-03 ENCOUNTER — Ambulatory Visit: Payer: Medicare Other | Admitting: Physician Assistant

## 2016-07-03 NOTE — Progress Notes (Deleted)
Cardiology Office Note    Date:  07/03/2016  ID:  VALLON KUSEL, DOB March 18, 1934, MRN [id number] PCP:  Mathews Argyle, MD  Cardiologist: Irish Lack   Chief Complaint: f/u afib  History of Present Illness:  [name] [name] is a 82 y.o. female with history of CAD s/p CABG 04/2011 with post-op AF, recent recurrence of AF, HTN, HLD, DM, GERD, carotid disease, PAD s/p angioplasty of L SFA 2013 for non-healing ulcer (with subsequent occlusion managed medically - vasc disease followed by Dr. Donnetta Hutching), anxiety, depression, dementia, chronic back pain, hiatal hernia, hypothyroidism, reflux esophagitis, stroke vs TIA, hypertrophic cardiomyopathy who presents for f/u of afib. 2D echo 07/2014: mod LVH, severe septal hypertrophy suggestive of hypertrophic cardiomyopathy, no LVOT obstruction, EF 65-70%, grade 1 DD, no RWMA, aortic sclerosis iwthout stenosis, mod LAE, mild RAE, mod TR, PASP 42. Last nuc 07/2014 was normal. Holter 07/2014: NSR with occ PACs, PVCs. She was seen in the office 06/02/16 and found to be in recurrent atrial fib with controlled rate. Aspirin was discontinued and Eliquis was started. She accidentally took this along with aspirin and had a nose bleed. No mention of DCCV in notes 06/02/16. CHADSVASC 7. Last labs 09/2015 showed Cr 1.02, Hgb 11.7.  *** dementia?   cbc, bmet, tsh 2d echo  Past Medical History:  Diagnosis Date  . Anemia   . Anxiety   . Atrial fibrillation ([place])   . Bilateral carotid artery stenosis   . Bowel incontinence   . CAD (coronary artery disease)    a. s/p CABG 2012.  [name] Chronic lower back pain    "back is very stiff because of fusion; can't stand up straight more than few moments"  . Closed fracture of unspecified part of neck of femur   . Complication of anesthesia    anesth. meds caused hallucinations   . Cough    no fever,   . Degenerative joint disease   . Depression   . Depressive disorder, not elsewhere classified   . Diverticulitis   .  Endometriosis   . Fall at home 05/15/2013  . Female stress incontinence   . GERD (gastroesophageal reflux disease)   . Headache   . Hiatal hernia   . HLD (hyperlipidemia)   . Hx of cardiovascular stress test    Lexiscan Myoview (11/15):  Low risk stress nuclear study with a small, mild fixed apical septal perfusion defect. EF is 52% with mild septal hypokinesis. No ischemia.  [name] Hx of echocardiogram    Echo (11/15):  Mod LVH, severe septal hypertrophy (suggestive HCM), no LVOT obstruction, rest gradient 15 mmHg across AV, vigorous LVF, EF 65-70%, no RWMA, Gr 1 DD, Ao sclerosis (no stenosis), mod LAE, mild RAE, mod TR, PASP 42 mmHg  . Hypertension   . Hypertonicity of bladder   . Hypertrophic cardiomyopathy ([place])   . Hypothyroidism   . Lumbago   . Muscle weakness (generalized)   . Obesity   . Osteoarthrosis, unspecified whether generalized or localized, unspecified site   . Progressive dementia with uncertain etiology [id number]   Suspect Lewy body .  [name] Reflux esophagitis   . Stroke [facility]) 2011   "old very small TIA/neurologist"  . Type II diabetes mellitus ([place])   . Unspecified glaucoma(365.9)     Past Surgical History:  Procedure Laterality Date  . BACK SURGERY     X2  . CARPAL TUNNEL RELEASE Bilateral   . CATARACT EXTRACTION W/ INTRAOCULAR LENS  IMPLANT, BILATERAL    .  CHOLECYSTECTOMY    . CORONARY ARTERY BYPASS GRAFT  04/17/2011   LT  internal mammary artery to left anterior descending, saphenous vein graft first diagonal, saphenous  vein graft to obtuse marginal 1, saphenous vein graft to posterior descending  . DILATION AND CURETTAGE OF UTERUS     "for endometrosis"  . DILATION AND CURETTAGE OF UTERUS     ENDOMETRIOSIS  . ENDARTERECTOMY Left 10/01/2015   Procedure: Left CAROTID Endartarectomy;  Surgeon: Rosetta Posner, MD;  Location: [place];  Service: Vascular;  Laterality: Left;  . HAND SURGERY     "for arthritis; right hand"  . JOINT REPLACEMENT    . LASER PHOTO ABLATION  Right 01/20/2013   Procedure: LASER PHOTO ABLATION;  Surgeon: Hayden Pedro, MD;  Location: [place];  Service: Ophthalmology;  Laterality: Right;  . LOWER EXTREMITY ANGIOGRAM N/A 03/10/2012   Procedure: LOWER EXTREMITY ANGIOGRAM;  Surgeon: Jettie Booze, MD;  Location[facility] CATH LAB;  Service: Cardiovascular;  Laterality: N/A;  . NEUROPLASTY / TRANSPOSITION MEDIAN NERVE AT CARPAL TUNNEL BILATERAL    . PARTIAL HIP ARTHROPLASTY  11/2009   left; "just the ball"  . PERIPHERAL VASCULAR CATHETERIZATION N/A 05/16/2015   Procedure: Abdominal Aortogram;  Surgeon: Elam Dutch, MD;  Location: [place] CV LAB;  Service: Cardiovascular;  Laterality: N/A;  . PERIPHERAL VASCULAR CATHETERIZATION Left 05/16/2015   Procedure: Lower Extremity Angiography;  Surgeon: Elam Dutch, MD;  Location: [place] CV LAB;  Service: Cardiovascular;  Laterality: Left;  . POSTERIOR FUSION LUMBAR SPINE     "removed L4-5, S1"  . PTCA  03/10/12   LLE  . RETINAL DETACHMENT SURGERY Right May 2014  . SCLERAL BUCKLE Right 01/20/2013   Procedure: SCLERAL BUCKLE;  Surgeon: Hayden Pedro, MD;  Location: [place];  Service: Ophthalmology;  Laterality: Right;  . TOTAL KNEE ARTHROPLASTY  ~ 2004   right  . TOTAL SHOULDER ARTHROPLASTY Right 10/07/2013   Procedure: TOTAL SHOULDER ARTHROPLASTY;  Surgeon: Alta Corning, MD;  Location: [place];  Service: Orthopedics;  Laterality: Right;    Current Medications: Current Outpatient Prescriptions  Medication Sig Dispense Refill  . acetaminophen (TYLENOL) 500 MG tablet Take 500 mg by mouth daily.     [name] apixaban (ELIQUIS) 5 MG TABS tablet Take 1 tablet (5 mg total) by mouth 2 (two) times daily. 60 tablet 3  . Ascorbic Acid (VITAMIN C) 1000 MG tablet Take 1,000 mg by mouth 3 (three) times a week.     . cholecalciferol (VITAMIN D) 1000 UNITS tablet Take 1,000 Units by mouth See admin instructions. On Monday, Tuesday, Wednesday, Friday, Saturday.    . COMBIGAN 0.2-0.5 % ophthalmic solution  Place 1-2 drops into both eyes daily.   0  . donepezil (ARICEPT) 5 MG tablet Take 1 tablet (5 mg total) by mouth at bedtime. 30 tablet 5  . ergocalciferol (VITAMIN D2) 50000 UNITS capsule Take 50,000 Units by mouth 2 (two) times a week. On Thursday and Sunday    . ezetimibe (ZETIA) 10 MG tablet take 1 tablet by mouth once daily 60 tablet 11  . FLUoxetine (PROZAC) 20 MG capsule Take 20 mg by mouth every morning.     [name] glipiZIDE (GLUCOTROL XL) 5 MG 24 hr tablet Take 5 mg by mouth at bedtime.     . Insulin Glargine (LANTUS SOLOSTAR) 100 UNIT/ML SOPN Inject 10 Units into the skin at bedtime. Patient states she forgot to take her insulin injection last night before bedtime.    [name]  levothyroxine (SYNTHROID, LEVOTHROID) 125 MCG tablet Take 125 mcg by mouth daily before breakfast.     . losartan (COZAAR) 25 MG tablet Take 25 mg by mouth daily.   0  . memantine (NAMENDA) 10 MG tablet Take 1 tablet (10 mg total) by mouth 2 (two) times daily. 60 tablet 6  . metFORMIN (GLUCOPHAGE-XR) 500 MG 24 hr tablet Take 500 mg by mouth 2 (two) times daily.  1  . metoprolol tartrate (LOPRESSOR) 25 MG tablet Take 25 mg by mouth 2 (two) times daily.      . mirabegron ER (MYRBETRIQ) 50 MG TB24 Take 50 mg by mouth every evening.    . mupirocin ointment (BACTROBAN) 2 % Apply to each nostril BID x 5 days 22 g 0  . omeprazole (PRILOSEC) 20 MG capsule Take 20 mg by mouth daily.    . pioglitazone (ACTOS) 30 MG tablet Take 30 mg by mouth every morning.     . pravastatin (PRAVACHOL) 80 MG tablet take 1 tablet by mouth once daily 90 tablet 3  . prednisoLONE sodium phosphate (INFLAMASE FORTE) 1 % ophthalmic solution Place 1 drop into the right eye 3 (three) times daily.    . silver sulfADIAZINE (SILVADENE) 1 % cream Apply 1 application topically daily. 50 g 0  . traMADol (ULTRAM) 50 MG tablet Take 1 tablet (50 mg total) by mouth every 6 (six) hours as needed for moderate pain. 30 tablet 0   No current facility-administered medications  for this visit.      Allergies:   Amitriptyline; Vesicare [solifenacin]; Vicodin [hydrocodone-acetaminophen]; and Polysaccharide iron complex   Social History   Social History  . Marital status: Divorced    Spouse name: N/A  . Number of children: 2  . Years of education: COLLEGE   Occupational History  .      retired Pharmacist, hospital   Social History Main Topics  . Smoking status: Former Smoker    Packs/day: 1.50    Types: Cigarettes    Quit date: 09/16/1987  . Smokeless tobacco: Never Used  . Alcohol use No  . Drug use: No  . Sexual activity: No   Other Topics Concern  . Not on file   Social History Narrative   Patient is divorced and lives alone.   Patient has two adult children.   Patient is a retired Pharmacist, hospital.   Patient has a college education.   Patient is left-handed.   Patient does not drink any caffeine.     Family History:  The patient's family history includes Cancer in her cousin and paternal aunt; Heart attack in her father and mother; Heart disease in her father; Hyperlipidemia in her father; Hypertension in her father and mother. ***  ROS:   Please see the history of present illness. Otherwise, review of systems is positive for ***.  All other systems are reviewed and otherwise negative.    PHYSICAL EXAM:   VS:  There were no vitals taken for this visit.  BMI: There is no height or weight on file to calculate BMI. GEN: Well nourished, well developed, in no acute distress HEENT: normocephalic, atraumatic Neck: no JVD, carotid bruits, or masses Cardiac: ***RRR; no murmurs, rubs, or gallops, no edema  Respiratory:  clear to auscultation bilaterally, normal work of breathing GI: soft, nontender, nondistended, + BS MS: no deformity or atrophy Skin: warm and dry, no rash Neuro:  Alert and Oriented x 3, Strength and sensation are intact, follows commands Psych: euthymic mood, full affect  Wt Readings from Last 3 Encounters:  06/02/16 195 lb (88.5 kg)  05/13/16  198 lb 11.2 oz (90.1 kg)  02/12/16 193 lb (87.5 kg)      Studies/Labs Reviewed:   EKG:  EKG was ordered today and personally reviewed by me and demonstrates *** EKG was not ordered today.***  Recent Labs: 09/28/2015: ALT 19 10/02/2015: BUN 15; Creatinine, Ser 1.02; Hemoglobin 11.7; Platelets 180; Potassium 4.1; Sodium 141   Lipid Panel    Component Value Date/Time   CHOL 157 06/26/2014 1132   TRIG 250.0 (H) 06/26/2014 1132   HDL 30.80 (L) 06/26/2014 1132   CHOLHDL 5 06/26/2014 1132   VLDL 50.0 (H) 06/26/2014 1132   LDLCALC 123 (H) 03/20/2014 1107   LDLDIRECT 73.3 06/26/2014 1132    Additional studies/ records that were reviewed today include: Summarized above.***    ASSESSMENT & PLAN:   1. PAF 2. CAD s/p CABG 3. HTN 4. Hypertrophic cardiomyopathy  Disposition: F/u with ***   Medication Adjustments/Labs and Tests Ordered: Current medicines are reviewed at length with the patient today.  Concerns regarding medicines are outlined above. Medication changes, Labs and Tests ordered today are summarized above and listed in the Patient Instructions accessible in Encounters.   Raechel Ache PA-C  07/03/2016 7:51 AM    [address], [place], [place]  [address] Phone: [phone]; Fax: [phone]

## 2016-07-15 ENCOUNTER — Ambulatory Visit (INDEPENDENT_AMBULATORY_CARE_PROVIDER_SITE_OTHER): Payer: Medicare Other | Admitting: Podiatry

## 2016-07-15 ENCOUNTER — Encounter: Payer: Self-pay | Admitting: Podiatry

## 2016-07-15 VITALS — BP 122/73 | HR 68 | Resp 16

## 2016-07-15 DIAGNOSIS — E1151 Type 2 diabetes mellitus with diabetic peripheral angiopathy without gangrene: Secondary | ICD-10-CM

## 2016-07-15 DIAGNOSIS — L97521 Non-pressure chronic ulcer of other part of left foot limited to breakdown of skin: Secondary | ICD-10-CM

## 2016-07-15 NOTE — Progress Notes (Signed)
Patient ID: [name] [name], female   DOB: 19-Mar-1934, 82 y.o.   MRN: [id number]    Subjective: Patient presents for ongoing local wound care for skin ulcers on the left foot with initial systems on January 2016. Patient has a history of peripheral arterial disease managed by vascular surgeon with a history of vascular surgery on the left leg in 2013 and ongoing surveillance by the vascular surgeon. Patient over this time is also develop 2 smaller skin ulcers on the left foot.Patient has a friend, [name] [name] in the treatment room  Objective: Pleasant orientated 3 Patient is not wearing surgical shoe on left foot and there are no gauze dressings over the wounds on the left foot  Vascular: No calf edema or calf tenderness bilaterally DP and PT pulses 0/4 bilaterally Capillary reflex delayed bilaterally  Neurological: Sensation to 10 g monofilament wire intact 2/5 bilaterally Vibratory sensation reactive bilaterally Ankle reflexes reactive bilaterally  Dermatological: 1mm (previous size 5 mm) skin lesion lateral base fifth metatarsal crusted that there remains open after debridement. There is no erythema, edema out anydrainage surrounding this area.   New superficial skin ulcer 81mm(previous size 2 mm)medial left hallux. The base appears granular/dry. There is no surrounding erythema, edema, drainage  Dorsal fourth left MPJ has a superficial 2 mm ulcer with a moist base. There is a low-grade erythema without any active drainage, malodor, warmth  Assessment: Noncompliance of patient today not wearing a surgical shoe and no dressings over the wounds Diabetic Peripheral arterial disease Skin ulcerations 3 left foot without obvious clinical sign of infection  Plan: Debride superficial ulcers 3 Apply Silvadene cream to all 3 ulcer sites in the left foot daily and cover with gauze. Patient with a friend in room is instructed to apply Silvadene cream to the 3 skin ulcers on the left  foot and cover with gauze daily. Wear the surgical shoe on left foot  Patient instructed that if she knows any sudden increase in pain, swelling, redness, fever to present to the emergency department  Reappoint 2 weeks

## 2016-07-15 NOTE — Patient Instructions (Signed)
Apply Silvadene cream to the 3 skin ulcers on the left foot daily and cover with gauze Wear the surgical shoe on your left foot daily You notice any sudden weakness of pain, swelling, redness, warmth, fever present to the emergency department  Diabetes and Foot Care Diabetes may cause you to have problems because of poor blood supply (circulation) to your feet and legs. This may cause the skin on your feet to become thinner, break easier, and heal more slowly. Your skin may become dry, and the skin may peel and crack. You may also have nerve damage in your legs and feet causing decreased feeling in them. You may not notice minor injuries to your feet that could lead to infections or more serious problems. Taking care of your feet is one of the most important things you can do for yourself.  HOME CARE INSTRUCTIONS  Wear shoes at all times, even in the house. Do not go barefoot. Bare feet are easily injured.  Check your feet daily for blisters, cuts, and redness. If you cannot see the bottom of your feet, use a mirror or ask someone for help.  Wash your feet with warm water (do not use hot water) and mild soap. Then pat your feet and the areas between your toes until they are completely dry. Do not soak your feet as this can dry your skin.  Apply a moisturizing lotion or petroleum jelly (that does not contain alcohol and is unscented) to the skin on your feet and to dry, brittle toenails. Do not apply lotion between your toes.  Trim your toenails straight across. Do not dig under them or around the cuticle. File the edges of your nails with an emery board or nail file.  Do not cut corns or calluses or try to remove them with medicine.  Wear clean socks or stockings every day. Make sure they are not too tight. Do not wear knee-high stockings since they may decrease blood flow to your legs.  Wear shoes that fit properly and have enough cushioning. To break in new shoes, wear them for just a few  hours a day. This prevents you from injuring your feet. Always look in your shoes before you put them on to be sure there are no objects inside.  Do not cross your legs. This may decrease the blood flow to your feet.  If you find a minor scrape, cut, or break in the skin on your feet, keep it and the skin around it clean and dry. These areas may be cleansed with mild soap and water. Do not cleanse the area with peroxide, alcohol, or iodine.  When you remove an adhesive bandage, be sure not to damage the skin around it.  If you have a wound, look at it several times a day to make sure it is healing.  Do not use heating pads or hot water bottles. They may burn your skin. If you have lost feeling in your feet or legs, you may not know it is happening until it is too late.  Make sure your health care provider performs a complete foot exam at least annually or more often if you have foot problems. Report any cuts, sores, or bruises to your health care provider immediately. SEEK MEDICAL CARE IF:   You have an injury that is not healing.  You have cuts or breaks in the skin.  You have an ingrown nail.  You notice redness on your legs or feet.  You feel  burning or tingling in your legs or feet.  You have pain or cramps in your legs and feet.  Your legs or feet are numb.  Your feet always feel cold. SEEK IMMEDIATE MEDICAL CARE IF:   There is increasing redness, swelling, or pain in or around a wound.  There is a red line that goes up your leg.  Pus is coming from a wound.  You develop a fever or as directed by your health care provider.  You notice a bad smell coming from an ulcer or wound.   This information is not intended to replace advice given to you by your health care provider. Make sure you discuss any questions you have with your health care provider.   Document Released: 08/29/2000 Document Revised: 05/04/2013 Document Reviewed: 02/08/2013 Elsevier Interactive Patient  Education [employer].

## 2016-07-23 ENCOUNTER — Other Ambulatory Visit: Payer: Self-pay | Admitting: Nurse Practitioner

## 2016-07-30 ENCOUNTER — Ambulatory Visit (INDEPENDENT_AMBULATORY_CARE_PROVIDER_SITE_OTHER): Payer: Medicare Other | Admitting: Podiatry

## 2016-07-30 ENCOUNTER — Encounter: Payer: Self-pay | Admitting: Podiatry

## 2016-07-30 VITALS — BP 137/63 | HR 84 | Temp 98.1°F | Resp 18

## 2016-07-30 DIAGNOSIS — E1151 Type 2 diabetes mellitus with diabetic peripheral angiopathy without gangrene: Secondary | ICD-10-CM

## 2016-07-30 DIAGNOSIS — L97521 Non-pressure chronic ulcer of other part of left foot limited to breakdown of skin: Secondary | ICD-10-CM | POA: Diagnosis not present

## 2016-07-30 NOTE — Patient Instructions (Signed)
By Silvadene cream to skin ulcers 3 on the left foot daily or or every other day and cover with gauze. The ulcer size of instable and I will extend visits from 2 week intervals to 4 week intervals Continue wearing the surgical shoe on the left foot daily If you notice any sudden increase in pain, swelling, redness present to the emergency department  Diabetes and Foot Care Diabetes may cause you to have problems because of poor blood supply (circulation) to your feet and legs. This may cause the skin on your feet to become thinner, break easier, and heal more slowly. Your skin may become dry, and the skin may peel and crack. You may also have nerve damage in your legs and feet causing decreased feeling in them. You may not notice minor injuries to your feet that could lead to infections or more serious problems. Taking care of your feet is one of the most important things you can do for yourself. Follow these instructions at home:  Wear shoes at all times, even in the house. Do not go barefoot. Bare feet are easily injured.  Check your feet daily for blisters, cuts, and redness. If you cannot see the bottom of your feet, use a mirror or ask someone for help.  Wash your feet with warm water (do not use hot water) and mild soap. Then pat your feet and the areas between your toes until they are completely dry. Do not soak your feet as this can dry your skin.  Apply a moisturizing lotion or petroleum jelly (that does not contain alcohol and is unscented) to the skin on your feet and to dry, brittle toenails. Do not apply lotion between your toes.  Trim your toenails straight across. Do not dig under them or around the cuticle. File the edges of your nails with an emery board or nail file.  Do not cut corns or calluses or try to remove them with medicine.  Wear clean socks or stockings every day. Make sure they are not too tight. Do not wear knee-high stockings since they may decrease blood flow to  your legs.  Wear shoes that fit properly and have enough cushioning. To break in new shoes, wear them for just a few hours a day. This prevents you from injuring your feet. Always look in your shoes before you put them on to be sure there are no objects inside.  Do not cross your legs. This may decrease the blood flow to your feet.  If you find a minor scrape, cut, or break in the skin on your feet, keep it and the skin around it clean and dry. These areas may be cleansed with mild soap and water. Do not cleanse the area with peroxide, alcohol, or iodine.  When you remove an adhesive bandage, be sure not to damage the skin around it.  If you have a wound, look at it several times a day to make sure it is healing.  Do not use heating pads or hot water bottles. They may burn your skin. If you have lost feeling in your feet or legs, you may not know it is happening until it is too late.  Make sure your health care provider performs a complete foot exam at least annually or more often if you have foot problems. Report any cuts, sores, or bruises to your health care provider immediately. Contact a health care provider if:  You have an injury that is not healing.  You have  cuts or breaks in the skin.  You have an ingrown nail.  You notice redness on your legs or feet.  You feel burning or tingling in your legs or feet.  You have pain or cramps in your legs and feet.  Your legs or feet are numb.  Your feet always feel cold. Get help right away if:  There is increasing redness, swelling, or pain in or around a wound.  There is a red line that goes up your leg.  Pus is coming from a wound.  You develop a fever or as directed by your health care provider.  You notice a bad smell coming from an ulcer or wound. This information is not intended to replace advice given to you by your health care provider. Make sure you discuss any questions you have with your health care  provider. Document Released: 08/29/2000 Document Revised: 02/07/2016 Document Reviewed: 02/08/2013 Elsevier Interactive Patient Education  2017 [employer].

## 2016-07-31 NOTE — Progress Notes (Signed)
Patient ID: [name] [name], female   DOB: 1934-03-03, 82 y.o.   MRN: [id number]     Subjective: Patient presents for ongoing local wound care for skin ulcers on the left foot with initial systems on January 2016. Patient has a history of peripheral arterial disease managed by vascular surgeon with a history of vascular surgery on the left leg in 2013 and ongoing surveillance by the vascular surgeon. Patient over this time is also develop 2 smaller skin ulcers on the left foot.Patient has a friend,Jessein the treatment room  Objective: Pleasant orientated 3 Patient is  wearing surgical shoe on left footand  no gauze dressings over the wounds on the left foot  Vascular: No calf edema or calf tenderness bilaterally DP and PT pulses 0/4 bilaterally Capillary reflex delayed bilaterally  Neurological: Sensation to 10 g monofilament wire intact 2/5 bilaterally Vibratory sensation reactive bilaterally Ankle reflexes reactive bilaterally  Dermatological: 72mm (previous size 5 mm) skin lesion lateral base fifth metatarsal crusted that there remains open after debridement. There is no erythema, edema out anydrainage surrounding this area. There is no purulent drainage  New superficial skin ulcer 60mm(previous size 2 mm)medial left hallux. The base appears granular/dry. There is no surrounding erythema, edema, or purulent drainage  Dorsal fourth left MPJ has a superficial 2 mm ulcer with a moist base. There is a low-grade erythema without any active drainage, malodor, warmth  Assessment: Noncompliance of patient today  with no gauze dressings over the wounds Diabetic peripheral arterial disease Diabetic peripheral neuropathy, mild Skin ulcerations 3 left foot without obvious clinical sign of infection  Plan: Debride superficial ulcers 3 Apply Silvadene cream to all 3 ulcer sites in the left foot daily and cover with gauze.Patient with a friend in room is instructed to apply  Silvadene cream to the 3 skin ulcers on the left foot and cover with gauze daily. Wear the surgical shoe on left foot  Patient instructed that if she knows any sudden increase in pain, swelling, redness, fever to present to the emergency department.  Wounds are stable over a long period of time reevaluate 4 weeks or sooner if patient has a concern

## 2016-07-31 NOTE — Progress Notes (Signed)
Patient ID: [name] [name], female   DOB: 1934-02-25, 81 y.o.   MRN: [id number]     Cardiology Office Note   Date:  08/01/2016   ID:  DEMETRISS KO, DOB 1934/01/29, MRN [id number]  PCP:  Mathews Argyle, MD  Cardiologist:  Larae Grooms, MD   Chief Complaint  Patient presents with  . Follow-up    AFib  f/u CAD    History of Present Illness: [name] [name] is a 82 y.o. female who is status post CABG in 0000000 complicated by postoperative atrial fibrillation, HTN, HL, diabetes, GERD, carotid stenosis, PAD status post angioplasty to the left SFA in 02/2012 for non healing foot ulcer, dementia.  She had  A negative stress test in late 2015.  She has had some intermittent CP not related to exertion.  She walks slowly with a cane.  She notes that her memory is getting worse.   She follows with Dr. Donnetta Hutching for carotid disease.   She has not been checking BP regularly outside of MD office.   Since the last visit, she had an arteriogram sowing an occluded left SFA.  This was conservatively managed.   Left leg ulcer healed.  No recent chest pain.      Past Medical History:  Diagnosis Date  . Anemia   . Anxiety   . Atrial fibrillation ([place])   . Bilateral carotid artery stenosis   . Bowel incontinence   . CAD (coronary artery disease)    a. s/p CABG 2012.  [name] Chronic lower back pain    "back is very stiff because of fusion; can't stand up straight more than few moments"  . Closed fracture of unspecified part of neck of femur   . Complication of anesthesia    anesth. meds caused hallucinations   . Cough    no fever,   . Degenerative joint disease   . Dementia   . Depression   . Depressive disorder, not elsewhere classified   . Diverticulitis   . Endometriosis   . Fall at home 05/15/2013  . Female stress incontinence   . GERD (gastroesophageal reflux disease)   . Headache   . Hiatal hernia   . HLD (hyperlipidemia)   . Hx of cardiovascular stress test    Lexiscan  Myoview (11/15):  Low risk stress nuclear study with a small, mild fixed apical septal perfusion defect. EF is 52% with mild septal hypokinesis. No ischemia.  [name] Hx of echocardiogram    Echo (11/15):  Mod LVH, severe septal hypertrophy (suggestive HCM), no LVOT obstruction, rest gradient 15 mmHg across AV, vigorous LVF, EF 65-70%, no RWMA, Gr 1 DD, Ao sclerosis (no stenosis), mod LAE, mild RAE, mod TR, PASP 42 mmHg  . Hypertension   . Hypertonicity of bladder   . Hypertrophic cardiomyopathy ([place])   . Hypothyroidism   . Lumbago   . Muscle weakness (generalized)   . Obesity   . Osteoarthrosis, unspecified whether generalized or localized, unspecified site   . Progressive dementia with uncertain etiology [id number]   Suspect Lewy body .  [name] Reflux esophagitis   . Stroke [facility]) 2011   "old very small TIA/neurologist"  . Type II diabetes mellitus ([place])   . Unspecified glaucoma(365.9)     Past Surgical History:  Procedure Laterality Date  . BACK SURGERY     X2  . CARPAL TUNNEL RELEASE Bilateral   . CATARACT EXTRACTION W/ INTRAOCULAR LENS  IMPLANT, BILATERAL    . CHOLECYSTECTOMY    .  CORONARY ARTERY BYPASS GRAFT  04/17/2011   LT  internal mammary artery to left anterior descending, saphenous vein graft first diagonal, saphenous  vein graft to obtuse marginal 1, saphenous vein graft to posterior descending  . DILATION AND CURETTAGE OF UTERUS     "for endometrosis"  . DILATION AND CURETTAGE OF UTERUS     ENDOMETRIOSIS  . ENDARTERECTOMY Left 10/01/2015   Procedure: Left CAROTID Endartarectomy;  Surgeon: Rosetta Posner, MD;  Location: [facility];  Service: Vascular;  Laterality: Left;  . HAND SURGERY     "for arthritis; right hand"  . JOINT REPLACEMENT    . LASER PHOTO ABLATION Right 01/20/2013   Procedure: LASER PHOTO ABLATION;  Surgeon: Hayden Pedro, MD;  Location: [place];  Service: Ophthalmology;  Laterality: Right;  . LOWER EXTREMITY ANGIOGRAM N/A 03/10/2012   Procedure: LOWER EXTREMITY  ANGIOGRAM;  Surgeon: Jettie Booze, MD;  Location: [facility] CATH LAB;  Service: Cardiovascular;  Laterality: N/A;  . NEUROPLASTY / TRANSPOSITION MEDIAN NERVE AT CARPAL TUNNEL BILATERAL    . PARTIAL HIP ARTHROPLASTY  11/2009   left; "just the ball"  . PERIPHERAL VASCULAR CATHETERIZATION N/A 05/16/2015   Procedure: Abdominal Aortogram;  Surgeon: Elam Dutch, MD;  Location: [place] CV LAB;  Service: Cardiovascular;  Laterality: N/A;  . PERIPHERAL VASCULAR CATHETERIZATION Left 05/16/2015   Procedure: Lower Extremity Angiography;  Surgeon: Elam Dutch, MD;  Location: [place] CV LAB;  Service: Cardiovascular;  Laterality: Left;  . POSTERIOR FUSION LUMBAR SPINE     "removed L4-5, S1"  . PTCA  03/10/12   LLE  . RETINAL DETACHMENT SURGERY Right May 2014  . SCLERAL BUCKLE Right 01/20/2013   Procedure: SCLERAL BUCKLE;  Surgeon: Hayden Pedro, MD;  Location: [facility];  Service: Ophthalmology;  Laterality: Right;  . TOTAL KNEE ARTHROPLASTY  ~ 2004   right  . TOTAL SHOULDER ARTHROPLASTY Right 10/07/2013   Procedure: TOTAL SHOULDER ARTHROPLASTY;  Surgeon: Alta Corning, MD;  Location: [place];  Service: Orthopedics;  Laterality: Right;     Current Outpatient Prescriptions  Medication Sig Dispense Refill  . acetaminophen (TYLENOL) 500 MG tablet Take 500 mg by mouth daily.     [name] apixaban (ELIQUIS) 5 MG TABS tablet Take 1 tablet (5 mg total) by mouth 2 (two) times daily. 60 tablet 3  . Ascorbic Acid (VITAMIN C) 1000 MG tablet Take 1,000 mg by mouth 3 (three) times a week.     . cholecalciferol (VITAMIN D) 1000 UNITS tablet Take 1,000 Units by mouth See admin instructions. On Monday, Tuesday, Wednesday, Friday, Saturday.    . COMBIGAN 0.2-0.5 % ophthalmic solution Place 1-2 drops into both eyes daily.   0  . donepezil (ARICEPT) 5 MG tablet Take 1 tablet (5 mg total) by mouth at bedtime. 30 tablet 5  . ergocalciferol (VITAMIN D2) 50000 UNITS capsule Take 50,000 Units by mouth 2 (two) times a week.  On Thursday and Sunday    . ezetimibe (ZETIA) 10 MG tablet take 1 tablet by mouth once daily 60 tablet 11  . FLUoxetine (PROZAC) 20 MG capsule Take 20 mg by mouth every morning.     [name] glipiZIDE (GLUCOTROL XL) 5 MG 24 hr tablet Take 5 mg by mouth at bedtime.     . Insulin Glargine (LANTUS SOLOSTAR) 100 UNIT/ML SOPN Inject 10 Units into the skin at bedtime. Patient states she forgot to take her insulin injection last night before bedtime.    [name] levothyroxine (SYNTHROID, LEVOTHROID) 125  MCG tablet Take 125 mcg by mouth daily before breakfast.     . losartan (COZAAR) 25 MG tablet Take 25 mg by mouth daily.   0  . memantine (NAMENDA) 10 MG tablet take 1 tablet by mouth twice a day 60 tablet 6  . metFORMIN (GLUCOPHAGE-XR) 500 MG 24 hr tablet Take 500 mg by mouth 2 (two) times daily.  1  . metoprolol tartrate (LOPRESSOR) 25 MG tablet Take 25 mg by mouth 2 (two) times daily.      . mirabegron ER (MYRBETRIQ) 50 MG TB24 Take 50 mg by mouth every evening.    . mupirocin ointment (BACTROBAN) 2 % Apply to each nostril BID x 5 days 22 g 0  . omeprazole (PRILOSEC) 20 MG capsule Take 20 mg by mouth daily.    . pioglitazone (ACTOS) 30 MG tablet Take 30 mg by mouth every morning.     . pravastatin (PRAVACHOL) 80 MG tablet take 1 tablet by mouth once daily 90 tablet 3  . prednisoLONE sodium phosphate (INFLAMASE FORTE) 1 % ophthalmic solution Place 1 drop into the right eye 3 (three) times daily.    . silver sulfADIAZINE (SILVADENE) 1 % cream Apply 1 application topically daily. 50 g 0  . traMADol (ULTRAM) 50 MG tablet Take 1 tablet (50 mg total) by mouth every 6 (six) hours as needed for moderate pain. 30 tablet 0   No current facility-administered medications for this visit.     Allergies:   Amitriptyline; Vesicare [solifenacin]; Vicodin [hydrocodone-acetaminophen]; and Polysaccharide iron complex    Social History:  The patient  reports that she quit smoking about 28 years ago. Her smoking use included  Cigarettes. She smoked 1.50 packs per day. She has never used smokeless tobacco. She reports that she does not drink alcohol or use drugs.   Family History:  The patient's family history includes Cancer in her cousin and paternal aunt; Heart attack in her father and mother; Heart disease in her father; Hyperlipidemia in her father; Hypertension in her father and mother.    ROS:  Please see the history of present illness.   Otherwise, review of systems are positive for arthritic joint pain- limits walking, nosebleeds.   All other systems are reviewed and negative.    PHYSICAL EXAM: VS:  BP (!) 106/58   Pulse 76   Ht 5' (1.524 m)   Wt 87.9 kg (193 lb 12.8 oz)   BMI 37.85 kg/m  , BMI Body mass index is 37.85 kg/m. GEN: Well nourished, well developed, in no acute distress  HEENT: normal  Neck: no JVD, carotid bruits, or masses Cardiac: *RRR; no murmurs, rubs, or gallops,no edema , diminished pedal pulses; dry skin area on the left foot Respiratory:  clear to auscultation bilaterally, normal work of breathing GI: soft, nontender, nondistended, + BS MS: no deformity or atrophy , left foot in a brace; bandage on left side of left foot Skin: warm and dry, no rash, dry skin on side of left foot Neuro:  Strength and sensation are intact Psych: euthymic mood, full affect   EKG:  EKG  ordered today, AFib, rate controlled..    Recent Labs: 09/28/2015: ALT 19 10/02/2015: BUN 15; Creatinine, Ser 1.02; Hemoglobin 11.7; Platelets 180; Potassium 4.1; Sodium 141    Lipid Panel    Component Value Date/Time   CHOL 157 06/26/2014 1132   TRIG 250.0 (H) 06/26/2014 1132   HDL 30.80 (L) 06/26/2014 1132   CHOLHDL 5 06/26/2014 1132   VLDL  50.0 (H) 06/26/2014 1132   LDLCALC 123 (H) 03/20/2014 1107   LDLDIRECT 73.3 06/26/2014 1132      Wt Readings from Last 3 Encounters:  08/01/16 87.9 kg (193 lb 12.8 oz)  06/02/16 88.5 kg (195 lb)  05/13/16 90.1 kg (198 lb 11.2 oz)      Other studies  Reviewed: Additional studies/ records that were reviewed today include: Cardiolite: no ischemia- 2015   ASSESSMENT AND PLAN:  1. Atrial fibrilllation: Started Eliquis a few months ago.  Occasional nose bleeds.  Humidifier recommended to help with nosebleeds.  Rate controlled on current meds.  Overall, tolerating Eliquis well. She understands she has to be very careful to avoid falls.  Renal function is normal. Weight is high enough that she would need 5 mg twice a day of Eliquis.   2 Essential hypertension, benign  Continue Metoprolol Tartrate Tablet, 25 MG, take 1 tablet by mouth twice a day for hypertension Increased Felodipine Tablet Extended Release 24 Hour, 20 MG, 1 tablet, Orally, Once a day Notes: Elevated today. continue to follow blood pressure at home. If She continues to get elevated readings over 150/90, would have to adjust medication further. Next BP check with PMD.  SHe can call us with home results.   3. Coronary artery disease  Notes: No angina. status post bypass surgery in 2012. Her coronary artery disease was discovered during part of her preoperative workup for shoulder surgery.  Negative stress test in 2015.   4. Peripheral Vascular Disease  Notes: Left SFA angioplasty for foot ulcer in 2013. Ulcer has healed but she has some area of dry skin on the left side of the foot. No breakdown of skin apparent.  Left SFA occluded by 2016 angiogram.  Medical therapy per Dr. Donnetta Hutching.  Following up with Dr. Amalia Hailey for left foot wound.   5. Hyperlipidemia: LDL 132 in 12/14. Increased pravastatin to 80 mg daily. Better control. In 10/15, LDL down to 73.  Checked most recently with Dr. Felipa Eth. Preventive Medicine  Adult topics discussed:  Diet: healthy diet.  Exercise: 5 days a week, at least 30 minutes of aerobic exercise. Avoid falling            Current medicines are reviewed at length with the patient today.    The following changes have been made:    Labs/  tests ordered today include: none  Orders Placed This Encounter  Procedures  . EKG 12-Lead     Disposition:   FU with me in 1 year   Signed, Larae Grooms, MD  08/01/2016 12:48 PM    [facility] [address], [place], [place]  [address] Phone: [phone]; Fax: [phone]

## 2016-08-01 ENCOUNTER — Encounter: Payer: Self-pay | Admitting: Interventional Cardiology

## 2016-08-01 ENCOUNTER — Ambulatory Visit (INDEPENDENT_AMBULATORY_CARE_PROVIDER_SITE_OTHER): Payer: Medicare Other | Admitting: Interventional Cardiology

## 2016-08-01 VITALS — BP 106/58 | HR 76 | Ht 60.0 in | Wt 193.8 lb

## 2016-08-01 DIAGNOSIS — I2510 Atherosclerotic heart disease of native coronary artery without angina pectoris: Secondary | ICD-10-CM

## 2016-08-01 DIAGNOSIS — I481 Persistent atrial fibrillation: Secondary | ICD-10-CM | POA: Diagnosis not present

## 2016-08-01 DIAGNOSIS — I4819 Other persistent atrial fibrillation: Secondary | ICD-10-CM

## 2016-08-01 DIAGNOSIS — I10 Essential (primary) hypertension: Secondary | ICD-10-CM

## 2016-08-01 NOTE — Patient Instructions (Signed)
**Note De-Identified  Obfuscation** Medication Instructions:  Same-no changes  Labwork: None  Testing/Procedures: None  Follow-Up: Your physician wants you to follow-up in: 9 months. You will receive a reminder letter in the mail two months in advance. If you don't receive a letter, please call our office to schedule the follow-up appointment.   Any Other Special Instructions Will Be Listed Below (If Applicable). Dr Irish Lack recommends that you get a humidifier to help reduce your nose bleeds     If you need a refill on your cardiac medications before your next appointment, please call your pharmacy.

## 2016-08-19 ENCOUNTER — Ambulatory Visit (INDEPENDENT_AMBULATORY_CARE_PROVIDER_SITE_OTHER): Payer: Medicare Other | Admitting: Neurology

## 2016-08-19 ENCOUNTER — Encounter: Payer: Self-pay | Admitting: Neurology

## 2016-08-19 VITALS — BP 140/66 | HR 78 | Resp 18 | Ht 60.0 in | Wt 198.0 lb

## 2016-08-19 DIAGNOSIS — F0390 Unspecified dementia without behavioral disturbance: Secondary | ICD-10-CM | POA: Diagnosis not present

## 2016-08-19 MED ORDER — MEMANTINE HCL 10 MG PO TABS: 10.0000 mg | ORAL_TABLET | Freq: Two times a day (BID) | ORAL | 3 refills | Status: DC

## 2016-08-19 MED ORDER — DONEPEZIL HCL 10 MG PO TABS: 10.0000 mg | ORAL_TABLET | Freq: Every day | ORAL | 3 refills | Status: DC

## 2016-08-19 NOTE — Progress Notes (Signed)
[facility]  Provider:  Larey Seat, M D  Referring Provider: Lajean Manes, MD Primary Care Physician:  Mathews Argyle, MD  Dementia   HPI: [name] has been an established patient since 2011 in the [facility] practice. She was initially referred here for mild memory loss and hallucinations.  The hallucinations began after a hip replacement in March 2011, after which she moved to rehabilitation at [facility]. She had experienced a noticeable difference in her handwriting and a left facial droop but a CT scan was negative for stroke. In August 2013 she left [facility],  and then returned home and now again lives alone.  Her Mini-Mental Status Examination was 29 of 30 points or more, was reaching last time  a 30 points AFT was 10 points. Fall risk was 10 points in 2013 .  In December 2013 to return for follow up with another  MMSE., which documented 26/30 , followed by [facility] testing at  16/30 points.  I repeated the [place]  at 13/30 points.  Her fall risk was  increased to 15 points, GDS (geriatric depression score) endorsed at 5 points. The patient underwent another eye surgery on May 8th of this year. She had decreasing visual acuity , her right eye remained red, puffy  and it burned. She has gotten confused when walking to the local bank, has been getting lost on familiar roads and she is not longer permitted to drive.  The 2011  hallucinations were described as " a strong sense that a snake was in the room " ,that there were snakes in her house-  she also has heard a hissing  sounds and rales and feared that the snake was in her room.. She called the police feel that somebody would break into a home,13 times- and as she recalled did not get a warm response. With the beginning of treatment with Seroquel , her  hallucinations resolved. She still reports vivid and at  times crazy dreams. REM BD.  There is no documented nocturnal activity for the last 12 months, but  the patient also lives along and has no witnesses.    [name] [name] is a 82 y.o. female  Is seen here as a routine revisit, interval history from 08-19-2016.  [name] has recovered mostly from her shoulder surgery affecting the right dominant shoulder. Incidentally , she presents in a boot , having graduated from a wheelchair last visit, 01-18-2016. with her right leg slightly propped up. She reports that she gets more tired these days and she is just slower but usually she can manage well with a 4 pronged cane. The patient has a known history of memory impairment and today score 26 out of 30 points on the Mini-Mental Status Examination by Lillia Corporal.  She did very well with clock drawing 4/ 4 points, word flow and repeating sentences, numbers and able to copy to an image without difficulties. She lost vision since her last retinal detachment which may account for some of the lower score. She has dry eyes, followed by [place], OD. She does not longer report hallucinations at night, all visual in the past. Crazy dreams, buy not acting out.  She feels not depressed. She cries sometimes with sad movies, but this is not true affect incontinence/ pseudobulbar .      MMSE - Mini Mental State Exam 08/19/2016 02/12/2016 08/13/2015  Orientation to time 4 4 4   Orientation to Place 4 4 5   Registration 3 3  3  Attention/ Calculation 5 1 4   Recall 1 1 1   Language- name 2 objects 2 2 2   Language- repeat 1 0 1  Language- follow 3 step command 3 3 3   Language- read & follow direction 1 1 1   Write a sentence 1 1 1   Copy design 1 0 1  Total score 26 20 26      CM - visit  11- 2015 .This patient had undergone a shoulder replacement in January 2015 and went again to Rehab at [facility] for a 2 month period, followed by out patient therapy until last week.  [name] [name] [place] has followed her for eye-care, and she reported her right eye remained irritated and itching. Her eyes are very dry. She has used  Restasis for several years, but stopped because of the price. She is on Lumigan eye drops still. She is conversant and pleasant. The patient was not longer aware that she had gotten lost several times. The daughter reminded her that these events truly took place. She returned home and in September was seen by her cardiologist for a possible TIA , numbness of the left arm- she presented to urgent care first ( Dr. Michail Sermon is PCP) . In the past she had a transient left facial droop. She had carotid doppler study with Dr. Irish Lack in September, those were less than 70% , Dr Early agreed and  suggested q 6 month follow up , left  Carotid stenosis of 65% and 45 % on the right.  She has completely recovered. Given her chronic a fib on ASA, I would not like to add any anticoagulation- she is at high fall risk.  MOCA today 08-02-14 at only 21 -30 points, not confused, but more delayed in her responses.    Review of Systems: Out of a complete 14 system review, the patient complains of only the following symptoms, and all other reviewed systems are negative.   02-12-2016 Vision loss, fall risk high, walks  Still on 4 pod cane. Often uses wheelchair for longer distance. Memory loss, high degree of fatigue, vision is limited, burning pain in the right eye,  Had retinal detachment. She has bowel and urinary incontinence for 24 month .  Current Outpatient Prescriptions  Medication Sig Dispense Refill  . acetaminophen (TYLENOL) 500 MG tablet Take 500 mg by mouth daily.     [name] apixaban (ELIQUIS) 5 MG TABS tablet Take 1 tablet (5 mg total) by mouth 2 (two) times daily. 60 tablet 3  . Ascorbic Acid (VITAMIN C) 1000 MG tablet Take 1,000 mg by mouth 3 (three) times a week.     . cholecalciferol (VITAMIN D) 1000 UNITS tablet Take 1,000 Units by mouth See admin instructions. On Monday, Tuesday, Wednesday, Friday, Saturday.    . COMBIGAN 0.2-0.5 % ophthalmic solution Place 1-2 drops into both eyes daily.   0  . donepezil  (ARICEPT) 5 MG tablet Take 1 tablet (5 mg total) by mouth at bedtime. 30 tablet 5  . ergocalciferol (VITAMIN D2) 50000 UNITS capsule Take 50,000 Units by mouth 2 (two) times a week. On Thursday and Sunday    . ezetimibe (ZETIA) 10 MG tablet take 1 tablet by mouth once daily 60 tablet 11  . FLUoxetine (PROZAC) 20 MG capsule Take 20 mg by mouth every morning.     [name] glipiZIDE (GLUCOTROL XL) 5 MG 24 hr tablet Take 5 mg by mouth at bedtime.     . Insulin Glargine (LANTUS SOLOSTAR) 100 UNIT/ML SOPN Inject  10 Units into the skin at bedtime. Patient states she forgot to take her insulin injection last night before bedtime.    [name] JANUVIA 100 MG tablet     . levothyroxine (SYNTHROID, LEVOTHROID) 125 MCG tablet Take 125 mcg by mouth daily before breakfast.     . losartan (COZAAR) 25 MG tablet Take 25 mg by mouth daily.   0  . memantine (NAMENDA) 10 MG tablet take 1 tablet by mouth twice a day 60 tablet 6  . metoprolol tartrate (LOPRESSOR) 25 MG tablet Take 25 mg by mouth 2 (two) times daily.      . mirabegron ER (MYRBETRIQ) 50 MG TB24 Take 50 mg by mouth every evening.    . mupirocin ointment (BACTROBAN) 2 % Apply to each nostril BID x 5 days 22 g 0  . omeprazole (PRILOSEC) 20 MG capsule Take 20 mg by mouth daily.    . pioglitazone (ACTOS) 30 MG tablet Take 30 mg by mouth every morning.     . pravastatin (PRAVACHOL) 80 MG tablet take 1 tablet by mouth once daily 90 tablet 3  . prednisoLONE sodium phosphate (INFLAMASE FORTE) 1 % ophthalmic solution Place 1 drop into the right eye 3 (three) times daily.    . silver sulfADIAZINE (SILVADENE) 1 % cream Apply 1 application topically daily. 50 g 0  . traMADol (ULTRAM) 50 MG tablet Take 1 tablet (50 mg total) by mouth every 6 (six) hours as needed for moderate pain. 30 tablet 0   No current facility-administered medications for this visit.     Allergies as of 08/19/2016 - Review Complete 08/19/2016  Allergen Reaction Noted  . Amitriptyline Other (See Comments)  09/27/2013  . Vesicare [solifenacin] Other (See Comments) 09/27/2013  . Vicodin [hydrocodone-acetaminophen] Other (See Comments) 05/09/2011  . Polysaccharide iron complex Hives, Itching, and Rash 05/12/2011    Vitals: BP 140/66   Pulse 78   Resp 18   Ht 5' (1.524 m)   Wt 198 lb (89.8 kg)   BMI 38.67 kg/m  Last Weight:  Wt Readings from Last 1 Encounters:  08/19/16 198 lb (89.8 kg)   Last Height:   Ht Readings from Last 1 Encounters:  08/19/16 5' (1.524 m)    Physical exam:  General: The patient is awake, alert and appears not in acute distress. The patient is well groomed. Head: Normocephalic, atraumatic. Neck is supple.  Cardiovascular:  irregular rate and rhythm without carotid bruit, and without distended neck veins. Respiratory: Lungs are clear to auscultation. Skin:  Without evidence of edema, or rash Trunk: BMI is elevated . The  patient has fallen in June 2014, none since, uses 4 prong cane.    Neurologic exam : The patient is awake and alert, oriented to place and time.  Memory subjective described as declining , but not rapidly.   Barron l11-18-15 , 21-30 points. MMSE 26-30 points 08-19-2016.   There is no longer a normal attention span & concentration ability.  Speech is fluent without dysarthria, mild dysphonia and no  aphasia.  Mood and affect are appropriate. She is happy.  Cranial nerves: Pupils are equal and briskly reactive to light. Her right eye is reddish swollen. Extraocular movements  in vertical and horizontal planes intact and without nystagmus. Visual fields by finger perimetry are intact. Hearing to finger rub intact.  Facial sensation intact to fine touch.  Facial motor strength is symmetric - there is a hint of left facial droop - and tongue and uvula move midline. Motor  exam:    normal strength in upper extremities. Good grip strength.  Sensory:  Fine touch, pinprick and vibration were tested in all extremities normal.  Coordination: Rapid  alternating movements in the fingers/hands are slow . Finger-to-nose maneuver tested and normal without evidence of ataxia, dysmetria or tremor. Gait and station: Patient walks with assistive device, very slow and leaning to the left.  Boot on the left foot.   Deep tendon reflexes: in the  upper and lower extremities are symmetric and intact.  Assessment:   1) Advancing memory los, inability to multitask, delay in motor responses and poor vision. The patient is not allowed to drive any longer.  Early stage dementia MMSE 26-30  versus mild cognitive impairment. Lewy body or frontal lobe dementia were suspected, but she no longer has hallucinations of auditory character on medication.   Discussed and repeated driving restriction - she should not even drive shopping or in her residential area.  She did have orthostatic dizziness at times. Wheelchair prevents falls.   Urge incontinence. Urine. Scott McDiarmitt, MD pelvic floor exercises?    Nathanyal Ashmead, MD

## 2016-08-27 ENCOUNTER — Ambulatory Visit: Payer: Medicare Other | Admitting: Podiatry

## 2016-09-08 ENCOUNTER — Emergency Department (HOSPITAL_COMMUNITY)
Admission: EM | Admit: 2016-09-08 | Discharge: 2016-09-08 | Disposition: A | Discharge status: Home / Self Care | Payer: Medicare Other | Attending: Emergency Medicine | Admitting: Emergency Medicine

## 2016-09-08 ENCOUNTER — Emergency Department (HOSPITAL_COMMUNITY): Payer: Medicare Other

## 2016-09-08 ENCOUNTER — Encounter (HOSPITAL_COMMUNITY): Payer: Self-pay | Admitting: Emergency Medicine

## 2016-09-08 DIAGNOSIS — Z7901 Long term (current) use of anticoagulants: Secondary | ICD-10-CM | POA: Diagnosis not present

## 2016-09-08 DIAGNOSIS — S32511A Fracture of superior rim of right pubis, initial encounter for closed fracture: Secondary | ICD-10-CM | POA: Insufficient documentation

## 2016-09-08 DIAGNOSIS — Z794 Long term (current) use of insulin: Secondary | ICD-10-CM | POA: Insufficient documentation

## 2016-09-08 DIAGNOSIS — I2510 Atherosclerotic heart disease of native coronary artery without angina pectoris: Secondary | ICD-10-CM | POA: Insufficient documentation

## 2016-09-08 DIAGNOSIS — Z96651 Presence of right artificial knee joint: Secondary | ICD-10-CM | POA: Insufficient documentation

## 2016-09-08 DIAGNOSIS — I10 Essential (primary) hypertension: Secondary | ICD-10-CM | POA: Diagnosis not present

## 2016-09-08 DIAGNOSIS — X58XXXA Exposure to other specified factors, initial encounter: Secondary | ICD-10-CM | POA: Diagnosis not present

## 2016-09-08 DIAGNOSIS — Z8673 Personal history of transient ischemic attack (TIA), and cerebral infarction without residual deficits: Secondary | ICD-10-CM | POA: Diagnosis not present

## 2016-09-08 DIAGNOSIS — Y939 Activity, unspecified: Secondary | ICD-10-CM | POA: Insufficient documentation

## 2016-09-08 DIAGNOSIS — S79911A Unspecified injury of right hip, initial encounter: Secondary | ICD-10-CM | POA: Diagnosis present

## 2016-09-08 DIAGNOSIS — Z96642 Presence of left artificial hip joint: Secondary | ICD-10-CM | POA: Diagnosis not present

## 2016-09-08 DIAGNOSIS — E119 Type 2 diabetes mellitus without complications: Secondary | ICD-10-CM | POA: Diagnosis not present

## 2016-09-08 DIAGNOSIS — G309 Alzheimer's disease, unspecified: Secondary | ICD-10-CM | POA: Diagnosis not present

## 2016-09-08 DIAGNOSIS — E039 Hypothyroidism, unspecified: Secondary | ICD-10-CM | POA: Diagnosis not present

## 2016-09-08 DIAGNOSIS — Y929 Unspecified place or not applicable: Secondary | ICD-10-CM | POA: Insufficient documentation

## 2016-09-08 DIAGNOSIS — Z96611 Presence of right artificial shoulder joint: Secondary | ICD-10-CM | POA: Insufficient documentation

## 2016-09-08 DIAGNOSIS — Z951 Presence of aortocoronary bypass graft: Secondary | ICD-10-CM | POA: Diagnosis not present

## 2016-09-08 DIAGNOSIS — Y999 Unspecified external cause status: Secondary | ICD-10-CM | POA: Insufficient documentation

## 2016-09-08 DIAGNOSIS — Z79899 Other long term (current) drug therapy: Secondary | ICD-10-CM | POA: Diagnosis not present

## 2016-09-08 DIAGNOSIS — S32592A Other specified fracture of left pubis, initial encounter for closed fracture: Secondary | ICD-10-CM

## 2016-09-08 LAB — CBC WITH DIFFERENTIAL/PLATELET
BASOS ABS: 0 10*3/uL (ref 0.0–0.1)
Basophils Relative: 0 %
Eosinophils Absolute: 0.2 10*3/uL (ref 0.0–0.7)
Eosinophils Relative: 3 %
HEMATOCRIT: 37.3 % (ref 36.0–46.0)
Hemoglobin: 12.4 g/dL (ref 12.0–15.0)
LYMPHS ABS: 1 10*3/uL (ref 0.7–4.0)
LYMPHS PCT: 13 %
MCH: 32.6 pg (ref 26.0–34.0)
MCHC: 33.2 g/dL (ref 30.0–36.0)
MCV: 98.2 fL (ref 78.0–100.0)
MONO ABS: 1 10*3/uL (ref 0.1–1.0)
Monocytes Relative: 13 %
NEUTROS ABS: 5.9 10*3/uL (ref 1.7–7.7)
Neutrophils Relative %: 71 %
Platelets: 182 10*3/uL (ref 150–400)
RBC: 3.8 MIL/uL — AB (ref 3.87–5.11)
RDW: 13.9 % (ref 11.5–15.5)
WBC: 8.3 10*3/uL (ref 4.0–10.5)

## 2016-09-08 LAB — URINALYSIS, ROUTINE W REFLEX MICROSCOPIC
BILIRUBIN URINE: NEGATIVE
Glucose, UA: NEGATIVE mg/dL
KETONES UR: NEGATIVE mg/dL
NITRITE: NEGATIVE
PROTEIN: NEGATIVE mg/dL
SPECIFIC GRAVITY, URINE: 1.01 (ref 1.005–1.030)
pH: 6 (ref 5.0–8.0)

## 2016-09-08 LAB — URINALYSIS, MICROSCOPIC (REFLEX)

## 2016-09-08 LAB — BASIC METABOLIC PANEL
ANION GAP: 10 (ref 5–15)
BUN: 20 mg/dL (ref 6–20)
CO2: 27 mmol/L (ref 22–32)
Calcium: 9.5 mg/dL (ref 8.9–10.3)
Chloride: 103 mmol/L (ref 101–111)
Creatinine, Ser: 0.98 mg/dL (ref 0.44–1.00)
GFR calc Af Amer: >60 mL/min (ref >60)
GFR calc non Af Amer: 53 mL/min — ABNORMAL LOW (ref >60)
GLUCOSE: 175 mg/dL — AB (ref 65–99)
POTASSIUM: 4.2 mmol/L (ref 3.5–5.1)
Sodium: 140 mmol/L (ref 135–145)

## 2016-09-08 MED ORDER — ACETAMINOPHEN-CODEINE #3 300-30 MG PO TABS
1.0000 | ORAL_TABLET | Freq: Once | ORAL | Status: AC
  Administered 2016-09-08: 1 via ORAL
  Filled 2016-09-08: qty 1

## 2016-09-08 MED ORDER — ACETAMINOPHEN 325 MG PO TABS
650.0000 mg | ORAL_TABLET | Freq: Once | ORAL | Status: AC
  Administered 2016-09-08: 650 mg via ORAL
  Filled 2016-09-08: qty 2

## 2016-09-08 MED ORDER — ACETAMINOPHEN-CODEINE #3 300-30 MG PO TABS: 1.0000 | ORAL_TABLET | Freq: Four times a day (QID) | ORAL | 0 refills | Status: DC | PRN

## 2016-09-08 NOTE — ED Notes (Signed)
Pt given scrub pants

## 2016-09-08 NOTE — ED Notes (Signed)
Pt. And RN attempted to contact family to pick patient up. No answer at this time. Will attempt again.

## 2016-09-08 NOTE — ED Notes (Signed)
Spoke a person who states they are Ms streeter"s sister ( pts daughter), who is states Ms Durward Parcel is sick and cannot came and get pt, when I asked about ms streeters husband cpould come and get pt , person states  That the husband has gone to have xmas with his family,  And that I did not understand situation, I  explanied she had not told me the situation, I also asked about other family members that could come, she said no one else in area,  I could barely understand her I  And told her that several times  . She wanted to know what d/c instructions said and I told  She was getting rx for tylenol  With codeine, when she asked about other instructions I said that I would give those to person who comes and gets her, , She asked how pt was going to get around at home and I said that I know that pt lives by herself and that she would get around as she had before.   Told her I was trying to get pt home, she hung up on me

## 2016-09-08 NOTE — ED Notes (Signed)
Patient transported to X-ray 

## 2016-09-08 NOTE — ED Notes (Signed)
Pt assisted to bedside commode and back into bed. Pt then assisted back into gown from home.

## 2016-09-08 NOTE — Discharge Instructions (Signed)
Take the pain medication as prescribed. Followup with your doctor. You may bear weight as tolerated. Return to the ED if you develop new or worsening symptoms.

## 2016-09-08 NOTE — ED Provider Notes (Signed)
[facility] DEPT Provider Note   CSN: [id number] Arrival date & time: 09/08/16  [id number]  By signing my name below, I, [name] [name], attest that this documentation has been prepared under the direction and in the presence of [name] Essex, MD . Electronically Signed: Jeanell [name], Scribe. 09/08/2016. 3:43 AM.  History   Chief Complaint Chief Complaint  Patient presents with  . Hip Pain   The history is provided by the patient. No language interpreter was used.   HPI Comments: [name] [name] is a 82 y.o. female who presents to the Emergency Department complaining of intermittent moderate right hip pain that started about 3 days ago. She states the pain was onset without any known trauma, and her pain worsened tonight. She reports that the pain is unrelieved by tylenol and exacerbated by movement. She reports associated back pain. She usually ambulates with a walker. She admits to daily use of Eliquis. She states she had 2 episodes of episodes of epistaxis earlier this week. She denies any prior hx of similar complaint, neck pain, abdominal pain, urinary symptoms, chest pain, headache, SOB, or other complaints.     PCP: Mathews Argyle, MD  Past Medical History:  Diagnosis Date  . Anemia   . Anxiety   . Atrial fibrillation ([place])   . Bilateral carotid artery stenosis   . Bowel incontinence   . CAD (coronary artery disease)    a. s/p CABG 2012.  [name] Chronic lower back pain    "back is very stiff because of fusion; can't stand up straight more than few moments"  . Closed fracture of unspecified part of neck of femur   . Complication of anesthesia    anesth. meds caused hallucinations   . Cough    no fever,   . Degenerative joint disease   . Dementia   . Depression   . Depressive disorder, not elsewhere classified   . Diverticulitis   . Endometriosis   . Fall at home 05/15/2013  . Female stress incontinence   . GERD (gastroesophageal reflux disease)   . Headache   .  Hiatal hernia   . HLD (hyperlipidemia)   . Hx of cardiovascular stress test    Lexiscan Myoview (11/15):  Low risk stress nuclear study with a small, mild fixed apical septal perfusion defect. EF is 52% with mild septal hypokinesis. No ischemia.  [name] Hx of echocardiogram    Echo (11/15):  Mod LVH, severe septal hypertrophy (suggestive HCM), no LVOT obstruction, rest gradient 15 mmHg across AV, vigorous LVF, EF 65-70%, no RWMA, Gr 1 DD, Ao sclerosis (no stenosis), mod LAE, mild RAE, mod TR, PASP 42 mmHg  . Hypertension   . Hypertonicity of bladder   . Hypertrophic cardiomyopathy ([place])   . Hypothyroidism   . Lumbago   . Muscle weakness (generalized)   . Obesity   . Osteoarthrosis, unspecified whether generalized or localized, unspecified site   . Progressive dementia with uncertain etiology [id number]   Suspect Lewy body .  [name] Reflux esophagitis   . Stroke [facility]) 2011   "old very small TIA/neurologist"  . Type II diabetes mellitus ([place])   . Unspecified glaucoma(365.9)     Patient Active Problem List   Diagnosis Date Noted  . Late onset Alzheimer's disease without behavioral disturbance 02/12/2016  . Atrial fibrillation ([place]) 02/12/2016  . Gastroparesis due to DM ([place]) 02/12/2016  . Diabetes mellitus due to underlying condition with diabetic retinopathy 02/12/2016  . Carotid stenosis 10/01/2015  . Abnormality of gait  08/13/2015  . Hypertrophic cardiomyopathy ([place]) 08/30/2014  . Other specified transient cerebral ischemias 08/02/2014  . Chronic atrial fibrillation ([place]) 08/02/2014  . Lewy body dementia with behavioral disturbance 02/21/2014  . Peripheral vascular disease ([place]) 01/18/2014  . [place] post total shoulder arthroplasty 12/15/2013  . DM2 (diabetes mellitus, type 2) ([place]) 12/08/2013  . Constipation 12/08/2013  . Hyperlipemia 12/08/2013  . Dementia 12/08/2013  . Unspecified hypothyroidism 10/30/2013  . Osteoarthritis of right shoulder 10/07/2013  . Fall at home 05/15/2013   . Moderate vision impairment-both eyes 05/15/2013  . Progressive dementia with uncertain etiology [id number]  . Obesity (BMI 30-39.9) 05/15/2013  . Rhegmatogenous retinal detachment of right eye 01/10/2013  . Hypertension   . Thyroid disease   . Depression   . IDDM (insulin dependent diabetes mellitus) ([place])   . GERD (gastroesophageal reflux disease)   . CAD (coronary artery disease)   . Bilateral carotid artery stenosis   . Obesity   . Degenerative joint disease   . Hiatal hernia     Past Surgical History:  Procedure Laterality Date  . BACK SURGERY     X2  . CARPAL TUNNEL RELEASE Bilateral   . CATARACT EXTRACTION W/ INTRAOCULAR LENS  IMPLANT, BILATERAL    . CHOLECYSTECTOMY    . CORONARY ARTERY BYPASS GRAFT  04/17/2011   LT  internal mammary artery to left anterior descending, saphenous vein graft first diagonal, saphenous  vein graft to obtuse marginal 1, saphenous vein graft to posterior descending  . DILATION AND CURETTAGE OF UTERUS     "for endometrosis"  . DILATION AND CURETTAGE OF UTERUS     ENDOMETRIOSIS  . ENDARTERECTOMY Left 10/01/2015   Procedure: Left CAROTID Endartarectomy;  Surgeon: Rosetta Posner, MD;  Location: [facility];  Service: Vascular;  Laterality: Left;  . HAND SURGERY     "for arthritis; right hand"  . JOINT REPLACEMENT    . LASER PHOTO ABLATION Right 01/20/2013   Procedure: LASER PHOTO ABLATION;  Surgeon: Hayden Pedro, MD;  Location: [facility];  Service: Ophthalmology;  Laterality: Right;  . LOWER EXTREMITY ANGIOGRAM N/A 03/10/2012   Procedure: LOWER EXTREMITY ANGIOGRAM;  Surgeon: Jettie Booze, MD;  Location: [facility] CATH LAB;  Service: Cardiovascular;  Laterality: N/A;  . NEUROPLASTY / TRANSPOSITION MEDIAN NERVE AT CARPAL TUNNEL BILATERAL    . PARTIAL HIP ARTHROPLASTY  11/2009   left; "just the ball"  . PERIPHERAL VASCULAR CATHETERIZATION N/A 05/16/2015   Procedure: Abdominal Aortogram;  Surgeon: Elam Dutch, MD;  Location: [facility] CV LAB;  Service:  Cardiovascular;  Laterality: N/A;  . PERIPHERAL VASCULAR CATHETERIZATION Left 05/16/2015   Procedure: Lower Extremity Angiography;  Surgeon: Elam Dutch, MD;  Location: [facility] CV LAB;  Service: Cardiovascular;  Laterality: Left;  . POSTERIOR FUSION LUMBAR SPINE     "removed L4-5, S1"  . PTCA  03/10/12   LLE  . RETINAL DETACHMENT SURGERY Right May 2014  . SCLERAL BUCKLE Right 01/20/2013   Procedure: SCLERAL BUCKLE;  Surgeon: Hayden Pedro, MD;  Location: [facility];  Service: Ophthalmology;  Laterality: Right;  . TOTAL KNEE ARTHROPLASTY  ~ 2004   right  . TOTAL SHOULDER ARTHROPLASTY Right 10/07/2013   Procedure: TOTAL SHOULDER ARTHROPLASTY;  Surgeon: Alta Corning, MD;  Location: [facility];  Service: Orthopedics;  Laterality: Right;    OB History    No data available       Home Medications    Prior to Admission medications   Medication Sig Start Date  End Date Taking? Authorizing Provider  acetaminophen (TYLENOL) 500 MG tablet Take 500 mg by mouth daily.     Historical Provider, MD  apixaban (ELIQUIS) 5 MG TABS tablet Take 1 tablet (5 mg total) by mouth 2 (two) times daily. 06/02/16   Jettie Booze, MD  Ascorbic Acid (VITAMIN C) 1000 MG tablet Take 1,000 mg by mouth 3 (three) times a week.     Historical Provider, MD  cholecalciferol (VITAMIN D) 1000 UNITS tablet Take 1,000 Units by mouth See admin instructions. On Monday, Tuesday, Wednesday, Friday, Saturday.    Historical Provider, MD  COMBIGAN 0.2-0.5 % ophthalmic solution Place 1-2 drops into both eyes daily.  01/09/15   Historical Provider, MD  donepezil (ARICEPT) 10 MG tablet Take 1 tablet (10 mg total) by mouth at bedtime. 08/19/16   Asencion Partridge Dohmeier, MD  ergocalciferol (VITAMIN D2) 50000 UNITS capsule Take 50,000 Units by mouth 2 (two) times a week. On Thursday and Sunday    Historical Provider, MD  ezetimibe (ZETIA) 10 MG tablet take 1 tablet by mouth once daily 06/19/16   Jettie Booze, MD  FLUoxetine (PROZAC) 20 MG  capsule Take 20 mg by mouth every morning.     Historical Provider, MD  glipiZIDE (GLUCOTROL XL) 5 MG 24 hr tablet Take 5 mg by mouth at bedtime.     Historical Provider, MD  Insulin Glargine (LANTUS SOLOSTAR) 100 UNIT/ML SOPN Inject 10 Units into the skin at bedtime. Patient states she forgot to take her insulin injection last night before bedtime.    Historical Provider, MD  JANUVIA 100 MG tablet  08/14/16   Historical Provider, MD  levothyroxine (SYNTHROID, LEVOTHROID) 125 MCG tablet Take 125 mcg by mouth daily before breakfast.     Historical Provider, MD  losartan (COZAAR) 25 MG tablet Take 25 mg by mouth daily.  05/22/16   Historical Provider, MD  memantine (NAMENDA) 10 MG tablet Take 1 tablet (10 mg total) by mouth 2 (two) times daily. 08/19/16   Asencion Partridge Dohmeier, MD  metoprolol tartrate (LOPRESSOR) 25 MG tablet Take 25 mg by mouth 2 (two) times daily.      Historical Provider, MD  mirabegron ER (MYRBETRIQ) 50 MG TB24 Take 50 mg by mouth every evening.    Historical Provider, MD  mupirocin ointment (BACTROBAN) 2 % Apply to each nostril BID x 5 days 09/29/15   Conrad [place], MD  omeprazole (PRILOSEC) 20 MG capsule Take 20 mg by mouth daily. 12/23/13   Historical Provider, MD  pioglitazone (ACTOS) 30 MG tablet Take 30 mg by mouth every morning.     Historical Provider, MD  pravastatin (PRAVACHOL) 80 MG tablet take 1 tablet by mouth once daily 06/11/16   Jettie Booze, MD  prednisoLONE sodium phosphate (INFLAMASE FORTE) 1 % ophthalmic solution Place 1 drop into the right eye 3 (three) times daily.    Historical Provider, MD  silver sulfADIAZINE (SILVADENE) 1 % cream Apply 1 application topically daily. 07/02/16   Richard Joelene Millin, DPM  traMADol (ULTRAM) 50 MG tablet Take 1 tablet (50 mg total) by mouth every 6 (six) hours as needed for moderate pain. 10/01/15   Ulyses Amor, PA-C    Family History Family History  Problem Relation Age of Onset  . Cancer Cousin   . Heart attack Mother   .  Hypertension Mother   . Heart attack Father   . Heart disease Father     Before age 25  . Hyperlipidemia Father   .  Hypertension Father   . Cancer Paternal Aunt     Social History Social History  Substance Use Topics  . Smoking status: Former Smoker    Packs/day: 1.50    Types: Cigarettes    Quit date: 09/16/1987  . Smokeless tobacco: Never Used  . Alcohol use No     Allergies   Amitriptyline; Vesicare [solifenacin]; Vicodin [hydrocodone-acetaminophen]; and Polysaccharide iron complex   Review of Systems Review of Systems A complete 10 system review of systems was obtained and all systems are negative except as noted in the HPI and PMH.   Physical Exam Updated Vital Signs BP 163/75 (BP Location: Right Arm)   Pulse 86   Temp 98.6 F (37 C) (Oral)   Resp 15   SpO2 98%   Physical Exam  Constitutional: She is oriented to person, place, and time. She appears well-developed and well-nourished. No distress.  HENT:  Head: Normocephalic and atraumatic.  Mouth/Throat: Oropharynx is clear and moist. No oropharyngeal exudate.  Eyes: Conjunctivae and EOM are normal. Pupils are equal, round, and reactive to light.  Neck: Normal range of motion. Neck supple.  No meningismus.  Cardiovascular: Normal rate, normal heart sounds and intact distal pulses.  An irregular rhythm present.  No murmur heard. Pulmonary/Chest: Effort normal and breath sounds normal. No respiratory distress.  Abdominal: Soft. There is no tenderness. There is no rebound and no guarding.  Musculoskeletal: Normal range of motion. She exhibits tenderness. She exhibits no edema.  TTP of the right lateral hip. Unable to range. Intact DP and PT pulses.   Neurological: She is alert and oriented to person, place, and time. No cranial nerve deficit. She exhibits normal muscle tone. Coordination normal.   5/5 strength throughout. CN 2-12 intact.Equal grip strength.   Skin: Skin is warm.  Psychiatric: She has a normal  mood and affect. Her behavior is normal.  Nursing note and vitals reviewed.    ED Treatments / Results  DIAGNOSTIC STUDIES: Oxygen Saturation is 98% on RA, normal by my interpretation.    COORDINATION OF CARE: 3:47 AM- Pt advised of plan for treatment and pt agrees.  Labs (all labs ordered are listed, but only abnormal results are displayed) Labs Reviewed  URINALYSIS, ROUTINE W REFLEX MICROSCOPIC - Abnormal; Notable for the following:       Result Value   Hgb urine dipstick SMALL (*)    Leukocytes, UA TRACE (*)    All other components within normal limits  CBC WITH DIFFERENTIAL/PLATELET - Abnormal; Notable for the following:    RBC 3.80 (*)    All other components within normal limits  BASIC METABOLIC PANEL - Abnormal; Notable for the following:    Glucose, Bld 175 (*)    GFR calc non Af Amer 53 (*)    All other components within normal limits  URINALYSIS, MICROSCOPIC (REFLEX) - Abnormal; Notable for the following:    Bacteria, UA RARE (*)    Squamous Epithelial / LPF 0-5 (*)    All other components within normal limits    EKG  EKG Interpretation None       Radiology Ct Head Wo Contrast  Result Date: 09/08/2016 CLINICAL DATA:  Trauma EXAM: CT HEAD WITHOUT CONTRAST TECHNIQUE: Contiguous axial images were obtained from the base of the skull through the vertex without intravenous contrast. COMPARISON:  Head CT 06/06/2013 FINDINGS: Brain: No mass lesion, intraparenchymal hemorrhage or extra-axial collection. No evidence of acute cortical infarct. There is periventricular hypoattenuation compatible with chronic microvascular disease. Vascular:  No hyperdense vessel or unexpected calcification. Skull: Normal visualized skull base, calvarium and extracranial soft tissues. Sinuses/Orbits: No sinus fluid levels or advanced mucosal thickening. No mastoid effusion. Right scleral banding. IMPRESSION: Chronic microvascular ischemia without acute intracranial abnormality. Electronically  Signed   By: Ulyses Jarred M.D.   On: 09/08/2016 06:24   Ct Pelvis Wo Contrast  Result Date: 09/08/2016 CLINICAL DATA:  Right hip pain. EXAM: CT OF THE PELVIS/BILATERAL HIPS WITHOUT CONTRAST TECHNIQUE: Multidetector CT imaging of the pelvis/bilateral hips was performed according to the standard protocol. Multiplanar CT image reconstructions were also generated. COMPARISON:  Radiographs earlier this day FINDINGS: Bones/Joint/Cartilage Right inferior pubic rami fracture is remote with osseous remottling. Probable remote superior pubic ramus fracture at the acetabular junction. Right femoral head is well seated in the acetabulum with mild to moderate osteoarthritis. Pubic symphysis and sacroiliac joints are congruent. Left hip arthroplasty in place. Posterior lumbar spine fusion is partially included. No evidence hip joint effusion. Ligaments Suboptimally assessed by CT. Muscles and Tendons No intramuscular hematoma. Muscle bulk is preserved. Visualized iliopsoas muscles are intact. Soft tissues No focal hematoma. Included intrapelvic organs are unremarkable. Atherosclerosis of the distal abdominal aorta is noted. IMPRESSION: Right pubic rami fractures appear remote. No evidence of acute fracture of the pelvis or right hip. Mild to moderate right hip osteoarthritis. Electronically Signed   By: Jeb Levering M.D.   On: 09/08/2016 06:24   Dg Hip Unilat W Or Wo Pelvis 2-3 Views Right  Result Date: 09/08/2016 CLINICAL DATA:  Right hip pain for 1 day. EXAM: DG HIP (WITH OR WITHOUT PELVIS) 2-3V RIGHT COMPARISON:  None. FINDINGS: Minimally displaced fractures of right superior and inferior pubic rami. No extension of the pubic symphysis. Moderate osteoarthritis of the right hip. The bones are under mineralized. Left hip prosthesis is partially included. Lumbar spine hardware is partially included. IMPRESSION: Minimally displaced fractures of right superior and inferior pubic rami. Electronically Signed   By:  Jeb Levering M.D.   On: 09/08/2016 04:39    Procedures Procedures (including critical care time)  Medications Ordered in ED Medications - No data to display   Initial Impression / Assessment and Plan / ED Course  I have reviewed the triage vital signs and the nursing notes.  Pertinent labs & imaging results that were available during my care of the patient were reviewed by me and considered in my medical decision making (see chart for details).  Clinical Course   3 days of R hip and groin pain. No falls but has been moving things to prepare for Christmas.  X ray shows nondisplaced pubic rami fractures. UA negative.  Patient able to transfer to commode and ambulate some.  Will continue pain control.   Fractures possibly remote on CT.  Patient wishing to go home and feels her pain is ok with tylenol 3 and doesn't want anything stronger.  She is attempting to contact her daughter for a ride home. followup with PCP and orthopedics.  Return precautions discussed.  Final Clinical Impressions(s) / ED Diagnoses   Final diagnoses:  Closed fracture of left inferior pubic ramus, initial encounter [facility])    New Prescriptions New Prescriptions   No medications on file   I personally performed the services described in this documentation, which was scribed in my presence. The recorded information has been reviewed and is accurate.    [name] Essex, MD 09/08/16 [phone]

## 2016-09-08 NOTE — ED Notes (Signed)
Pt assisted to bedside commode and into paper scrub pants.

## 2016-09-08 NOTE — ED Notes (Signed)
Attempted to contact daughter. Left a message on voicemail.

## 2016-09-08 NOTE — ED Triage Notes (Signed)
Pt arrives to ED by [facility] for right hip pain that she has had for the past three days. The pain is a 9/10 with exertion and 1/10 at rest. Pt denies any trauma, but stated she did several chores around the house that may have caused the pain. Vitals stable.

## 2016-09-08 NOTE — ED Notes (Signed)
EDP at bedside  

## 2016-09-19 ENCOUNTER — Other Ambulatory Visit: Payer: Self-pay | Admitting: Interventional Cardiology

## 2016-09-19 NOTE — Telephone Encounter (Signed)
Rx refill sent to pharmacy. 

## 2016-10-22 ENCOUNTER — Encounter: Payer: Self-pay | Admitting: Podiatry

## 2016-10-22 ENCOUNTER — Ambulatory Visit (INDEPENDENT_AMBULATORY_CARE_PROVIDER_SITE_OTHER): Payer: Medicare Other | Admitting: Podiatry

## 2016-10-22 VITALS — BP 131/62 | HR 96 | Temp 98.0°F | Resp 18

## 2016-10-22 DIAGNOSIS — E1151 Type 2 diabetes mellitus with diabetic peripheral angiopathy without gangrene: Secondary | ICD-10-CM

## 2016-10-22 DIAGNOSIS — L97521 Non-pressure chronic ulcer of other part of left foot limited to breakdown of skin: Secondary | ICD-10-CM | POA: Diagnosis not present

## 2016-10-22 NOTE — Patient Instructions (Signed)
Apply silvadene cream to the skin ulces on the left foot daily  And cover with gauze  wear the surgical shoe on th left foot  if here is any sudden increase in swelling , redness, drainage , pain  Go to the emergency room  Diabetes and Foot Care Diabetes may cause you to have problems because of poor blood supply (circulation) to your feet and legs. This may cause the skin on your feet to become thinner, break easier, and heal more slowly. Your skin may become dry, and the skin may peel and crack. You may also have nerve damage in your legs and feet causing decreased feeling in them. You may not notice minor injuries to your feet that could lead to infections or more serious problems. Taking care of your feet is one of the most important things you can do for yourself. Follow these instructions at home:  Wear shoes at all times, even in the house. Do not go barefoot. Bare feet are easily injured.  Check your feet daily for blisters, cuts, and redness. If you cannot see the bottom of your feet, use a mirror or ask someone for help.  Wash your feet with warm water (do not use hot water) and mild soap. Then pat your feet and the areas between your toes until they are completely dry. Do not soak your feet as this can dry your skin.  Apply a moisturizing lotion or petroleum jelly (that does not contain alcohol and is unscented) to the skin on your feet and to dry, brittle toenails. Do not apply lotion between your toes.  Trim your toenails straight across. Do not dig under them or around the cuticle. File the edges of your nails with an emery board or nail file.  Do not cut corns or calluses or try to remove them with medicine.  Wear clean socks or stockings every day. Make sure they are not too tight. Do not wear knee-high stockings since they may decrease blood flow to your legs.  Wear shoes that fit properly and have enough cushioning. To break in new shoes, wear them for just a few hours a  day. This prevents you from injuring your feet. Always look in your shoes before you put them on to be sure there are no objects inside.  Do not cross your legs. This may decrease the blood flow to your feet.  If you find a minor scrape, cut, or break in the skin on your feet, keep it and the skin around it clean and dry. These areas may be cleansed with mild soap and water. Do not cleanse the area with peroxide, alcohol, or iodine.  When you remove an adhesive bandage, be sure not to damage the skin around it.  If you have a wound, look at it several times a day to make sure it is healing.  Do not use heating pads or hot water bottles. They may burn your skin. If you have lost feeling in your feet or legs, you may not know it is happening until it is too late.  Make sure your health care provider performs a complete foot exam at least annually or more often if you have foot problems. Report any cuts, sores, or bruises to your health care provider immediately. Contact a health care provider if:  You have an injury that is not healing.  You have cuts or breaks in the skin.  You have an ingrown nail.  You notice redness on your  legs or feet.  You feel burning or tingling in your legs or feet.  You have pain or cramps in your legs and feet.  Your legs or feet are numb.  Your feet always feel cold. Get help right away if:  There is increasing redness, swelling, or pain in or around a wound.  There is a red line that goes up your leg.  Pus is coming from a wound.  You develop a fever or as directed by your health care provider.  You notice a bad smell coming from an ulcer or wound. This information is not intended to replace advice given to you by your health care provider. Make sure you discuss any questions you have with your health care provider. Document Released: 08/29/2000 Document Revised: 02/07/2016 Document Reviewed: 02/08/2013 Elsevier Interactive Patient Education   2017 [employer].

## 2016-10-22 NOTE — Progress Notes (Signed)
Patient ID: [name] [name], female   DOB: November 04, 1933, 83 y.o.   MRN: [id number]    Subjective: Patient presents for follow-up care for ongoing chronic diabetic skin ulcers left foot, with initial symptoms on January 2016. Patient was last evaluated on 07/30/2016 and was scheduled for follow-up 4 weeks from that date.. Patient did not present until the visit today for follow-up care. She denies any significant changes in the skin ulcers on the left foot. Patient is recovering from pelvic fracture.   Patient has a history of peripheral arterial disease managed by vascular surgeon with a history of vascular surgery on the left leg in 2013 and ongoing surveillance by the vascular surgeon. Patient over this time is also develop 2 smaller skin ulcers on the left foot.Patient has a friend,Jessein the treatment room  Objective: Pleasant orientated 3 Patient is  wearing surgical shoe on left footand  no gauze dressings over the wounds on the left foot  Vascular: No calf edema or calf tenderness bilaterally DP and PT pulses 0/4 bilaterally Capillary reflex delayed bilaterally  Neurological: Sensation to 10 g monofilament wire intact 2/5 bilaterally Vibratory sensation reactive bilaterally Ankle reflexes reactive bilaterally  Dermatological: Atrophic skin bilaterally 66mm (previous size 5 mm) skin lesion lateral base fifth metatarsal crusted with slight serous drainage and keratoses surrounding the wound  There is no erythema, edema out anydrainage surrounding this area. There is no purulent drainage  Musculoskeletal: Patient walks with assistance of walker  New superficial skin ulcer 71mmmedial left hallux. The base appears granular/dry. There is no surrounding erythema, edema, or purulent drainage  Dorsal fourth left MPJ has a superficial 2 mm ulcer with a moist base. There is a low-grade erythema without any active drainage, malodor, warmth  Assessment: Noncompliance of patient  today  with no gauze dressings over the wounds Diabetic peripheral arterial disease Diabetic peripheral neuropathy, mild Skin ulcerations 3 left foot without obvious clinical sign of infection  Plan: Debride superficial ulcers 3 Apply Silvadene cream to all 3 ulcer sites in the left foot daily and cover with gauze.Patient with a friend in room is instructed to apply Silvadene cream to the 3 skin ulcers on the left foot and cover with gauze daily. Wear the surgical shoe on left foot  Patient instructed that if she notices any sudden increase in pain, swelling, redness, fever to present to the emergency department.  Wounds are stable over a long period of time reevaluate 4 weeks or sooner if patient has a concern Issued as pending evaluation with vascular surgeon  Reappoint 4 weeks

## 2016-10-22 NOTE — Progress Notes (Signed)
   Subjective:    Patient ID: [name] [name], female    DOB: 06-10-1934, 83 y.o.   MRN: [id number]  HPI   I have some new places that have popped up on my left foot and I broke my pelvis back at christmas time and went to [facility] and had x-rays done    Review of Systems  All other systems reviewed and are negative.      Objective:   Physical Exam        Assessment & Plan:

## 2016-10-29 ENCOUNTER — Ambulatory Visit (INDEPENDENT_AMBULATORY_CARE_PROVIDER_SITE_OTHER): Payer: Medicare Other | Admitting: Ophthalmology

## 2016-10-29 DIAGNOSIS — E11319 Type 2 diabetes mellitus with unspecified diabetic retinopathy without macular edema: Secondary | ICD-10-CM | POA: Diagnosis not present

## 2016-10-29 DIAGNOSIS — H338 Other retinal detachments: Secondary | ICD-10-CM

## 2016-10-29 DIAGNOSIS — H35033 Hypertensive retinopathy, bilateral: Secondary | ICD-10-CM

## 2016-10-29 DIAGNOSIS — I10 Essential (primary) hypertension: Secondary | ICD-10-CM

## 2016-10-29 DIAGNOSIS — H43813 Vitreous degeneration, bilateral: Secondary | ICD-10-CM

## 2016-10-29 DIAGNOSIS — E113593 Type 2 diabetes mellitus with proliferative diabetic retinopathy without macular edema, bilateral: Secondary | ICD-10-CM | POA: Diagnosis not present

## 2016-11-11 ENCOUNTER — Encounter: Payer: Self-pay | Admitting: Family

## 2016-11-17 ENCOUNTER — Ambulatory Visit: Payer: Medicare Other | Admitting: Family

## 2016-11-19 ENCOUNTER — Ambulatory Visit (INDEPENDENT_AMBULATORY_CARE_PROVIDER_SITE_OTHER): Payer: Medicare Other | Admitting: Podiatry

## 2016-11-19 ENCOUNTER — Telehealth: Payer: Self-pay | Admitting: *Deleted

## 2016-11-19 DIAGNOSIS — E1151 Type 2 diabetes mellitus with diabetic peripheral angiopathy without gangrene: Secondary | ICD-10-CM | POA: Diagnosis not present

## 2016-11-19 DIAGNOSIS — L97521 Non-pressure chronic ulcer of other part of left foot limited to breakdown of skin: Secondary | ICD-10-CM

## 2016-11-19 MED ORDER — SILVER SULFADIAZINE 1 % EX CREA: 1.0000 "application " | TOPICAL_CREAM | Freq: Every day | CUTANEOUS | 0 refills | Status: AC

## 2016-11-19 MED ORDER — SILVER SULFADIAZINE 1 % EX CREA: 1.0000 "application " | TOPICAL_CREAM | Freq: Every day | CUTANEOUS | 0 refills | Status: DC

## 2016-11-19 MED ORDER — SILVER SULFADIAZINE 1 % EX CREA
1.0000 | TOPICAL_CREAM | Freq: Every day | CUTANEOUS | 0 refills | Status: DC
Start: 2016-11-19 — End: 2016-11-19

## 2016-11-19 NOTE — Telephone Encounter (Signed)
Called patient and I left a message stating I need the pharmacy name. [name] [name]

## 2016-11-19 NOTE — Progress Notes (Signed)
Patient ID: [name] [name], female   DOB: 12/29/1933, 83 y.o.   MRN: [id number]    Subjective: Patient presents for follow-up care for ongoing chronic diabetic skin ulcers left foot, with initial symptoms on January 2016. Patient was last evaluated on 07/30/2016 and was scheduled for follow-up 4 weeks from that date.. Patient did not present until the visit today for follow-up care until the visit of 10/22/2016. She denies any significant changes in the skin ulcers on the left foot. Patient states she has an appointment with vascular surgeon doctor early next week.   Patient is recovering from pelvic fracture. Patient's friend is present in the treatment room  Patient has a history of peripheral arterial disease managed by vascular surgeon with a history of vascular surgery on the left leg in 2013 and ongoing surveillance by the vascular surgeon. Patient over this time is also develop 2 smaller skin ulcers on the left foot.Patient has a friend,Jessein the treatment room  Objective: Pleasant orientated 3 Patient is not wearing surgical shoe on left footand no gauze dressings over the wounds on the left foot  Vascular: No calf edema or calf tenderness bilaterally DP and PT pulses 0/4 bilaterally Capillary reflex delayed bilaterally  Neurological: Sensation to 10 g monofilament wire intact 2/5 bilaterally Vibratory sensation reactive bilaterally Ankle reflexes reactive bilaterally  Dermatological: Atrophic skin bilaterally 48mm (previous size 5 mm) skin lesion lateral base fifth metatarsal crusted with slight serous drainage and keratoses surrounding the wound  There is no erythema, edema out anydrainage surrounding this area. There is no purulent drainage   superficial skin ulcer 17mmmedial left hallux. The base appears granular/dry. There is no surrounding erythema, edema,or purulentdrainage  Dorsal fourth left MPJ has a superficial 2 mm ulcer with a moist base. There is  a low-grade erythema without any active drainage, malodor, warmth 2 mm superficial ulcer dorsal second left toe without surrounding erythema or edema or active drainage  Musculoskeletal: Patient walks with assistance of walker   Assessment: Noncompliance of patient today with no gauzedressings over the wounds and not wearing surgical shoe Diabetic peripheral arterial disease Diabetic peripheral neuropathy, mild Skin ulcerations 4 left foot without obvious clinical sign of infection  Plan: Debride superficial ulcers 4 Apply Silvadene cream to all 4 ulcer sites in the left foot daily and cover with gauze.Patient with a friend in room is instructed to apply Silvadene cream to the 4 skin ulcers on the left foot and cover with gauze daily. Wear the surgical shoe on left foot  Patient instructed that if she notices any sudden increase in pain, swelling, redness, fever to present to the emergency department.  Wounds are stable over a long period of time reevaluate 6 weeks or sooner if patient has a concern Issued as pending evaluation with vascular surgeon  Reappoint 6 weeks

## 2016-11-19 NOTE — Patient Instructions (Signed)
Apply Silvadene cream to the skin ulcers on the left big toe, second left toe, fourth left toe and the side of the left foot every other day or daily. Wear the surgical shoe on the left foot on ongoing continuous basis Please see Dr. early answer next scheduled visit If you notice any sudden increase in pain, swelling, redness, drainage, fever present to the emergency department   Diabetes and Foot Care Diabetes may cause you to have problems because of poor blood supply (circulation) to your feet and legs. This may cause the skin on your feet to become thinner, break easier, and heal more slowly. Your skin may become dry, and the skin may peel and crack. You may also have nerve damage in your legs and feet causing decreased feeling in them. You may not notice minor injuries to your feet that could lead to infections or more serious problems. Taking care of your feet is one of the most important things you can do for yourself. Follow these instructions at home:  Wear shoes at all times, even in the house. Do not go barefoot. Bare feet are easily injured.  Check your feet daily for blisters, cuts, and redness. If you cannot see the bottom of your feet, use a mirror or ask someone for help.  Wash your feet with warm water (do not use hot water) and mild soap. Then pat your feet and the areas between your toes until they are completely dry. Do not soak your feet as this can dry your skin.  Apply a moisturizing lotion or petroleum jelly (that does not contain alcohol and is unscented) to the skin on your feet and to dry, brittle toenails. Do not apply lotion between your toes.  Trim your toenails straight across. Do not dig under them or around the cuticle. File the edges of your nails with an emery board or nail file.  Do not cut corns or calluses or try to remove them with medicine.  Wear clean socks or stockings every day. Make sure they are not too tight. Do not wear knee-high stockings since  they may decrease blood flow to your legs.  Wear shoes that fit properly and have enough cushioning. To break in new shoes, wear them for just a few hours a day. This prevents you from injuring your feet. Always look in your shoes before you put them on to be sure there are no objects inside.  Do not cross your legs. This may decrease the blood flow to your feet.  If you find a minor scrape, cut, or break in the skin on your feet, keep it and the skin around it clean and dry. These areas may be cleansed with mild soap and water. Do not cleanse the area with peroxide, alcohol, or iodine.  When you remove an adhesive bandage, be sure not to damage the skin around it.  If you have a wound, look at it several times a day to make sure it is healing.  Do not use heating pads or hot water bottles. They may burn your skin. If you have lost feeling in your feet or legs, you may not know it is happening until it is too late.  Make sure your health care provider performs a complete foot exam at least annually or more often if you have foot problems. Report any cuts, sores, or bruises to your health care provider immediately. Contact a health care provider if:  You have an injury that is not  healing.  You have cuts or breaks in the skin.  You have an ingrown nail.  You notice redness on your legs or feet.  You feel burning or tingling in your legs or feet.  You have pain or cramps in your legs and feet.  Your legs or feet are numb.  Your feet always feel cold. Get help right away if:  There is increasing redness, swelling, or pain in or around a wound.  There is a red line that goes up your leg.  Pus is coming from a wound.  You develop a fever or as directed by your health care provider.  You notice a bad smell coming from an ulcer or wound. This information is not intended to replace advice given to you by your health care provider. Make sure you discuss any questions you have with  your health care provider. Document Released: 08/29/2000 Document Revised: 02/07/2016 Document Reviewed: 02/08/2013 Elsevier Interactive Patient Education  2017 [employer].

## 2016-11-27 ENCOUNTER — Telehealth: Payer: Self-pay | Admitting: Interventional Cardiology

## 2016-11-27 ENCOUNTER — Encounter: Payer: Self-pay | Admitting: Vascular Surgery

## 2016-11-27 NOTE — Telephone Encounter (Signed)
New message     Request for surgical clearance:  1. What type of surgery is being performed? Botox injection   2. When is this surgery scheduled? 12/02/16   3. Are there any medications that need to be held prior to surgery and how long?Eliquis stop this 11/28/16  4. Name of physician performing surgery? Dr Matilde Sprang  5. What is your office phone and fax number? Fax [phone]

## 2016-11-28 ENCOUNTER — Other Ambulatory Visit: Payer: Self-pay | Admitting: Urology

## 2016-11-28 NOTE — Telephone Encounter (Signed)
Follow up   2nd call Per [name] they needs this authorization faxed over today for the patient to hold the Eliquis

## 2016-11-28 NOTE — Telephone Encounter (Signed)
Spoke with [name] at [facility].  Pt having Botox injection into her urethra on 12/02/16 and instructions are needed in regards to holding Eliquis.  Pt has not yet taken her AM dose today, according to [facility].  Only need instruction on Eliquis No surgical clearance is needed.

## 2016-11-28 NOTE — Telephone Encounter (Signed)
Called and informed [name] at [facility] and informed of instructions per PharmD.  She will call patient's daughter to instruct on Eliquis.  Faxed copy of this documentation to [facility] [phone]

## 2016-11-28 NOTE — Telephone Encounter (Signed)
I agree with this recommendation.

## 2016-11-28 NOTE — Telephone Encounter (Signed)
Given hx of stroke and afib (CHADS2 score of 5 and CHADS2VASC score of 8 - HTN, DM, TIA, age, CAD, sex) recommend that pt only hold her Eliquis for 24 hours prior to procedure and resume as soon as it's safe after.

## 2016-12-01 ENCOUNTER — Encounter (HOSPITAL_COMMUNITY)
Admission: RE | Admit: 2016-12-01 | Discharge: 2016-12-01 | Disposition: A | Discharge status: Home / Self Care | Payer: Medicare Other | Source: Ambulatory Visit | Attending: Urology | Admitting: Urology

## 2016-12-01 ENCOUNTER — Encounter (HOSPITAL_COMMUNITY): Payer: Self-pay

## 2016-12-01 DIAGNOSIS — Z951 Presence of aortocoronary bypass graft: Secondary | ICD-10-CM | POA: Diagnosis not present

## 2016-12-01 DIAGNOSIS — H409 Unspecified glaucoma: Secondary | ICD-10-CM | POA: Diagnosis not present

## 2016-12-01 DIAGNOSIS — E039 Hypothyroidism, unspecified: Secondary | ICD-10-CM | POA: Diagnosis not present

## 2016-12-01 DIAGNOSIS — E1151 Type 2 diabetes mellitus with diabetic peripheral angiopathy without gangrene: Secondary | ICD-10-CM | POA: Diagnosis not present

## 2016-12-01 DIAGNOSIS — E7800 Pure hypercholesterolemia, unspecified: Secondary | ICD-10-CM | POA: Diagnosis not present

## 2016-12-01 DIAGNOSIS — Z794 Long term (current) use of insulin: Secondary | ICD-10-CM | POA: Diagnosis not present

## 2016-12-01 DIAGNOSIS — Z7982 Long term (current) use of aspirin: Secondary | ICD-10-CM | POA: Diagnosis not present

## 2016-12-01 DIAGNOSIS — F329 Major depressive disorder, single episode, unspecified: Secondary | ICD-10-CM | POA: Diagnosis not present

## 2016-12-01 DIAGNOSIS — I2510 Atherosclerotic heart disease of native coronary artery without angina pectoris: Secondary | ICD-10-CM | POA: Diagnosis not present

## 2016-12-01 DIAGNOSIS — Z8673 Personal history of transient ischemic attack (TIA), and cerebral infarction without residual deficits: Secondary | ICD-10-CM | POA: Diagnosis not present

## 2016-12-01 DIAGNOSIS — Z792 Long term (current) use of antibiotics: Secondary | ICD-10-CM | POA: Diagnosis not present

## 2016-12-01 DIAGNOSIS — Z79899 Other long term (current) drug therapy: Secondary | ICD-10-CM | POA: Diagnosis not present

## 2016-12-01 DIAGNOSIS — I10 Essential (primary) hypertension: Secondary | ICD-10-CM | POA: Diagnosis not present

## 2016-12-01 DIAGNOSIS — F419 Anxiety disorder, unspecified: Secondary | ICD-10-CM | POA: Diagnosis not present

## 2016-12-01 DIAGNOSIS — N3946 Mixed incontinence: Secondary | ICD-10-CM | POA: Diagnosis not present

## 2016-12-01 DIAGNOSIS — Z87891 Personal history of nicotine dependence: Secondary | ICD-10-CM | POA: Diagnosis not present

## 2016-12-01 HISTORY — DX: Reserved for concepts with insufficient information to code with codable children: IMO0002

## 2016-12-01 LAB — CBC
HEMATOCRIT: 34.5 % — AB (ref 36.0–46.0)
Hemoglobin: 11.1 g/dL — ABNORMAL LOW (ref 12.0–15.0)
MCH: 32.7 pg (ref 26.0–34.0)
MCHC: 32.2 g/dL (ref 30.0–36.0)
MCV: 101.8 fL — AB (ref 78.0–100.0)
Platelets: 189 10*3/uL (ref 150–400)
RBC: 3.39 MIL/uL — AB (ref 3.87–5.11)
RDW: 14.5 % (ref 11.5–15.5)
WBC: 6.8 10*3/uL (ref 4.0–10.5)

## 2016-12-01 LAB — BASIC METABOLIC PANEL
Anion gap: 5 (ref 5–15)
BUN: 24 mg/dL — ABNORMAL HIGH (ref 6–20)
CHLORIDE: 107 mmol/L (ref 101–111)
CO2: 29 mmol/L (ref 22–32)
Calcium: 9.4 mg/dL (ref 8.9–10.3)
Creatinine, Ser: 1.27 mg/dL — ABNORMAL HIGH (ref 0.44–1.00)
GFR calc non Af Amer: 39 mL/min — ABNORMAL LOW (ref >60)
GFR, EST AFRICAN AMERICAN: 45 mL/min — AB (ref >60)
Glucose, Bld: 77 mg/dL (ref 65–99)
POTASSIUM: 5.3 mmol/L — AB (ref 3.5–5.1)
SODIUM: 141 mmol/L (ref 135–145)

## 2016-12-01 LAB — SURGICAL PCR SCREEN
MRSA, PCR: POSITIVE — AB
Staphylococcus aureus: POSITIVE — AB

## 2016-12-01 LAB — GLUCOSE, CAPILLARY: Glucose-Capillary: 72 mg/dL (ref 65–99)

## 2016-12-01 NOTE — H&P (Signed)
CC/HPI: [name] [name] has urge incontinence and high volume enuresis. Her incontinence was worse when I saw her last week. [name] memory issues may or may not have been from [place]. She is hoping not to try more antimuscarinics. She did have a leak-point pressure in [name] past of 57 cmH2O and she does leak with coughing and sneezing. She is still on Myrbetriq as a partial responder.   When I saw [name] [name] in April 2017, I had added Myrbetriq and her culture was positive. Her Botox was on December 26, 2015.   [name] [name] is starting having more urge incontinence. She is wearing now 4 pads a day on [name] myrbetriq.   importantly [name] [name] was quite tender with her Botox treatments today. She clinically was not infected. She is on antibiotic. Hassan Rowan will have her try to stop [name] myrbetriq and speak to her daughter.   [name] [name] had Botox 2 weeks ago.  [name] [name] on myrbetriq thinks Botox only helped some. She is not having bedwetting or high-volume episodes but still uses 4 pads a day often mild or moderately wet. Clinically she was not infected but I sent her urine for culture   Her residual is 54 mL   I will reassess her in 1 month. [name] treatment was very tender last time but otherwise went well   [name]. Thurlow went back in [name]. Clinically she is not infected today. She basically is dry at night which I think is a big improvement but she still uses 4 or 5 pads per day which are damp. Overall [name] double therapy has improvement. She's not sure she would do [name] treatment again. Last time was quite tender as well   Reassess with a residual in 4 months   Today  Clinically [name] [name] was not infected but I sent her urine for culture. She is still on myrbetriq. She is starting to wet [name] bed. She does report some mild or dementia but she did very well today on history. She fell and now uses a walker. She does not want another Botox under local anesthesia because it was so painful. On further review she has  had bedwetting before.   Because of mobility issues I will not get a culture 1 week prior but I gave her a 5 day prescription for ciprofloxacin to start 2 days prior and I will also give her Ancef on [name] day of [name] procedure   [name] [name] actually has high treatment goals and she would rather have [name] procedure done under anesthesia. We will schedule this. Her residual today was 0 mL      ALLERGIES: Amitriptyline HCl TABS Polysaccharide Iron Complex CAPS VESIcare TABS Vicodin TABS    MEDICATIONS: Myrbetriq 50 mg tablet, extended release 24 hr 1 tablet PO Daily  Actos 30 MG Oral Tablet Oral  Aspirin 81 MG TABS Oral  Ciprofloxacin HCl - 250 MG Oral Tablet 1 Oral  Combigan 0.2 %-0.5 % drops Ophthalmic  Crestor 5 mg tablet Oral  Felodipine ER 5 MG Oral Tablet Extended Release 24 Hour Oral  Fluoxetine Hcl 20 mg capsule Oral  GlipiZIDE 5 MG Oral Tablet Oral  Lantus SOLN Subcutaneous  Levothyroxine Sodium 125 MCG Oral Tablet Oral  MetFORMIN HCl - 500 MG Oral Tablet Oral  Metoprolol Succinate ER 25 MG Oral Tablet Extended Release 24 Hour Oral  Namenda 10 mg tablet Oral  Omeprazole 20 MG Oral Capsule Delayed Release Oral  Pravastatin Sodium 40 MG Oral Tablet Oral  Prednisolone Acetate 1 % suspension, drops Ophthalmic  SEROquel 25 MG Oral Tablet Oral  TraMADol HCl - 50 MG Oral Tablet Oral  Tylenol TABS Oral  Vitamin C TABS Oral  Vitamin D2 2,000 unit tablet Oral  Vitamin D3 CAPS Oral  Zetia 10 mg tablet Oral     GU PSH: Cystourethroscopy, W/Injection For Chemodenervation Of Bladder - 05/22/2016     PSH Notes: Gynecologic Services     NON-GU PSH: CABG (coronary artery bypass grafting) - Dec 22, 2012 Cataract Surgery.. Cholecystectomy (open) Hand/finger Surgery Revise Knee Joint - 12/23/2007     GU PMH: Urge incontinence, Urge incontinence of urine - 01/30/2016 Urinary Frequency, Increased urinary frequency - 01/30/2016 Urinary Tract Inf, Unspec site, Urinary tract infection -  01/14/2016 Mixed incontinence, Urge and stress incontinence - 01/09/2016 Enuresis, Nocturnal, Enuresis, nocturnal only - 12/01/2014 Urinary Urgency, Urinary urgency - 12-22-13     PMH Notes:  2008-03-30 12:04:54 - Note: Arthritis   NON-GU PMH: Encounter for general adult medical examination without abnormal findings, Encounter for preventive health examination - 11/26/2015 Muscle weakness (generalized), Muscle weakness - 12-22-2013 Other lack of coordination, Muscular incoordination - 2013/12/22 Anxiety, Anxiety - Dec 22, 2012 Cerebral infarction, unspecified, Stroke Syndrome - Dec 22, 2012 Personal history of other diseases of [name] circulatory system, History of hypertension - December 22, 2012 Personal history of other diseases of [name] nervous system and sense organs, History of glaucoma - 22-Dec-2012 Personal history of other endocrine, nutritional and metabolic disease, History of hypercholesterolemia - Dec 22, 2012, History of type 2 diabetes mellitus, - 12-22-2012, History of hypothyroidism, - 22-Dec-2012 Personal history of other mental and behavioral disorders, History of depression - 22-Dec-2012 Personal history of other specified conditions, History of heartburn - 12-22-2012    FAMILY HISTORY: Death In [name] Family Father - Father Death In [name] Family Mother - Mother Family Health Status Number - Runs In Family Heart Disease - Mother Stroke Syndrome - Father   SOCIAL HISTORY: Marital Status: Divorced Drinks 2 caffeinated drinks per day.    REVIEW OF SYSTEMS:    GU Review Female:  [name] reports get up at night to urinate and leakage of urine. [name] denies frequent urination, hard to postpone urination, burning /pain with urination, stream starts and stops, trouble starting your stream, have to strain to urinate, and currently pregnant.  Gastrointestinal (Upper):  [name] denies nausea, vomiting, and indigestion/ heartburn.  Gastrointestinal (Lower):  [name] denies diarrhea and constipation.  Constitutional:  [name] denies fever, night sweats, weight loss, and  fatigue.  Skin:  [name] denies skin rash/ lesion and itching.  Eyes:  [name] denies blurred vision and double vision.  Ears/ Nose/ Throat:  [name] denies sore throat and sinus problems.  Hematologic/Lymphatic:  [name] denies swollen glands and easy bruising.  Cardiovascular:  [name] denies leg swelling and chest pains.  Respiratory:  [name] denies cough and shortness of breath.  Endocrine:  [name] denies excessive thirst.  Musculoskeletal:  [name] denies back pain and joint pain.  Neurological:  [name] denies headaches and dizziness.  Psychologic:  [name] denies depression and anxiety.   VITAL SIGNS:     11/10/2016 12:04 PM  Weight 187 lb / 84.82 kg  Height 60 in / 152.4 cm  BP 117/68 mmHg  Pulse 74 /min  Temperature 98.8 F / 37 C  BMI 36.5 kg/m   PAST DATA REVIEWED:   Source Of History:  [name]   PROCEDURES:    Urinalysis w/Scope  Dipstick Dipstick Cont'd Micro  Color: Yellow Bilirubin: Neg WBC/hpf: >60/hpf  Appearance: Cloudy Ketones: Neg RBC/hpf:  3 - 10/hpf  Specific Gravity: 1.020 Blood: 2+ Bacteria: Many (>50/hpf)  pH: 5.5 Protein: 1+ Cystals: NS (Not Seen)  Glucose: Neg Urobilinogen: 0.2 Casts: NS (Not Seen)   Nitrites: Neg Trichomonas: Not Present   Leukocyte Esterase: 3+ Mucous: Not Present    Epithelial Cells: 0 - 5/hpf    Yeast: NS (Not Seen)    Sperm: Not Present    ASSESSMENT:     ICD-10 Details  1 GU:  Enuresis, Nocturnal - N39.44   2  Urge incontinence - N39.41    After a thorough review of [name] management options for [name] [name]'s condition [name] [name]  elected to proceed with surgical therapy as noted above. We have discussed [name] potential benefits and risks of [name] procedure, side effects of [name] proposed treatment, [name] likelihood of [name] [name] achieving [name] goals of [name] procedure, and any potential problems that might occur during [name] procedure or recuperation. Informed consent has been obtained.

## 2016-12-01 NOTE — Patient Instructions (Addendum)
AVIAN GREENAWALT  12/01/2016   Your procedure is scheduled on: 12/02/2016    Report to [facility]  Entrance take [facility]  elevators to 3rd floor to  [place] at    1000 AM.  Call this number if you have problems the morning of surgery [phone]   Remember: ONLY 1 PERSON MAY GO WITH YOU TO SHORT STAY TO GET  READY MORNING OF [place].  Do not eat food or drink liquids :After Midnight.  Eat a good healthy snack prior to bedtime.    Take these medicines the morning of surgery with A SIP OF WATER: Eye drops as usual, Plendil ( Felodipine), Fluoxetine ( PRozac), Namenda, Metoprolol ( Lopressor), Prilosec ( Omeprazole), Tramadol ( Ultram) if needed , Levothyroxine (Synthroid) DO NOT TAKE ANY DIABETIC MEDICATIONS DAY OF YOUR SURGERY                               You may not have any metal on your body including hair pins and              piercings  Do not wear jewelry, make-up, lotions, powders or perfumes, deodorant             Do not wear nail polish.  Do not shave  48 hours prior to surgery.             Do not bring valuables to the hospital. [place].  Contacts, dentures or bridgework may not be worn into surgery.      Patients discharged the day of surgery will not be allowed to drive home.  Name and phone number of your driver: bob hendrix boyfrined pt will bring cell number tomorrow                Please read over the following fact sheets you were given: _____________________________________________________________________             [facility] - Preparing for Surgery Before surgery, you can play an important role.  Because skin is not sterile, your skin needs to be as free of germs as possible.  You can reduce the number of germs on your skin by washing with CHG (chlorahexidine gluconate) soap before surgery.  CHG is an antiseptic cleaner which kills germs and bonds with the skin to  continue killing germs even after washing. Please DO NOT use if you have an allergy to CHG or antibacterial soaps.  If your skin becomes reddened/irritated stop using the CHG and inform your nurse when you arrive at Short Stay. Do not shave (including legs and underarms) for at least 48 hours prior to the first CHG shower.  You may shave your face/neck. Please follow these instructions carefully:  1.  Shower with CHG Soap the night before surgery and the  morning of Surgery.  2.  If you choose to wash your hair, wash your hair first as usual with your  normal  shampoo.  3.  After you shampoo, rinse your hair and body thoroughly to remove the  shampoo.                           4.  Use CHG  as you would any other liquid soap.  You can apply chg directly  to the skin and wash                       Gently with a scrungie or clean washcloth.  5.  Apply the CHG Soap to your body ONLY FROM THE NECK DOWN.   Do not use on face/ open                           Wound or open sores. Avoid contact with eyes, ears mouth and genitals (private parts).                       Wash face,  Genitals (private parts) with your normal soap.             6.  Wash thoroughly, paying special attention to the area where your surgery  will be performed.  7.  Thoroughly rinse your body with warm water from the neck down.  8.  DO NOT shower/wash with your normal soap after using and rinsing off  the CHG Soap.                9.  Pat yourself dry with a clean towel.            10.  Wear clean pajamas.            11.  Place clean sheets on your bed the night of your first shower and do not  sleep with pets. Day of Surgery : Do not apply any lotions/deodorants the morning of surgery.  Please wear clean clothes to the hospital/surgery center.  FAILURE TO FOLLOW THESE INSTRUCTIONS MAY RESULT IN THE CANCELLATION OF YOUR SURGERY PATIENT SIGNATURE_________________________________  NURSE  SIGNATURE__________________________________  ________________________________________________________________________ How to Manage Your Diabetes Before and After Surgery  Why is it important to control my blood sugar before and after surgery? . Improving blood sugar levels before and after surgery helps healing and can limit problems. . A way of improving blood sugar control is eating a healthy diet by: o  Eating less sugar and carbohydrates o  Increasing activity/exercise o  Talking with your doctor about reaching your blood sugar goals . High blood sugars (greater than 180 mg/dL) can raise your risk of infections and slow your recovery, so you will need to focus on controlling your diabetes during the weeks before surgery. . Make sure that the doctor who takes care of your diabetes knows about your planned surgery including the date and location.  How do I manage my blood sugar before surgery? . Check your blood sugar at least 4 times a day, starting 2 days before surgery, to make sure that the level is not too high or low. o Check your blood sugar the morning of your surgery when you wake up and every 2 hours until you get to the Short Stay unit. . If your blood sugar is less than 70 mg/dL, you will need to treat for low blood sugar: o Do not take insulin. o Treat a low blood sugar (less than 70 mg/dL) with  cup of clear juice (cranberry or apple), 4 glucose tablets, OR glucose gel. o Recheck blood sugar in 15 minutes after treatment (to make sure it is greater than 70 mg/dL). If your blood sugar is not greater than 70 mg/dL on recheck, call [phone] for further instructions. [name]  Report your blood sugar to the short stay nurse when you get to Short Stay.  . If you are admitted to the hospital after surgery: o Your blood sugar will be checked by the staff and you will probably be given insulin after surgery (instead of oral diabetes medicines) to make sure you have good blood sugar  levels. o The goal for blood sugar control after surgery is 80-180 mg/dL.   WHAT DO I DO ABOUT MY DIABETES MEDICATION?  [name] Do not take oral diabetes medicines (pills) the morning of surgery. [name] TAKE ONLY MORNING DOSE OF GLUCOTROL DAY{ BEFORE SURGERY.   . THE NIGHT BEFORE SURGERY, take 1/2 of pm dose of Lantus Insulin            .   Patient Signature:  Date:   Nurse Signature:  Date:    [facility] Patient Education Committee, August 2015

## 2016-12-02 ENCOUNTER — Ambulatory Visit (HOSPITAL_COMMUNITY)
Admission: RE | Admit: 2016-12-02 | Discharge: 2016-12-02 | Disposition: A | Discharge status: Home / Self Care | Payer: Medicare Other | Source: Ambulatory Visit | Attending: Urology | Admitting: Urology

## 2016-12-02 ENCOUNTER — Ambulatory Visit (HOSPITAL_COMMUNITY): Payer: Medicare Other | Admitting: Certified Registered"

## 2016-12-02 ENCOUNTER — Encounter (HOSPITAL_COMMUNITY)
Admission: RE | Disposition: A | Discharge status: Home / Self Care | Payer: Self-pay | Source: Ambulatory Visit | Attending: Urology

## 2016-12-02 ENCOUNTER — Encounter (HOSPITAL_COMMUNITY): Payer: Self-pay | Admitting: *Deleted

## 2016-12-02 DIAGNOSIS — H409 Unspecified glaucoma: Secondary | ICD-10-CM | POA: Diagnosis not present

## 2016-12-02 DIAGNOSIS — I2510 Atherosclerotic heart disease of native coronary artery without angina pectoris: Secondary | ICD-10-CM | POA: Insufficient documentation

## 2016-12-02 DIAGNOSIS — Z794 Long term (current) use of insulin: Secondary | ICD-10-CM | POA: Insufficient documentation

## 2016-12-02 DIAGNOSIS — E039 Hypothyroidism, unspecified: Secondary | ICD-10-CM | POA: Insufficient documentation

## 2016-12-02 DIAGNOSIS — Z7982 Long term (current) use of aspirin: Secondary | ICD-10-CM | POA: Insufficient documentation

## 2016-12-02 DIAGNOSIS — I10 Essential (primary) hypertension: Secondary | ICD-10-CM | POA: Insufficient documentation

## 2016-12-02 DIAGNOSIS — E1151 Type 2 diabetes mellitus with diabetic peripheral angiopathy without gangrene: Secondary | ICD-10-CM | POA: Insufficient documentation

## 2016-12-02 DIAGNOSIS — Z951 Presence of aortocoronary bypass graft: Secondary | ICD-10-CM | POA: Insufficient documentation

## 2016-12-02 DIAGNOSIS — Z792 Long term (current) use of antibiotics: Secondary | ICD-10-CM | POA: Insufficient documentation

## 2016-12-02 DIAGNOSIS — F419 Anxiety disorder, unspecified: Secondary | ICD-10-CM | POA: Insufficient documentation

## 2016-12-02 DIAGNOSIS — N3946 Mixed incontinence: Secondary | ICD-10-CM | POA: Diagnosis not present

## 2016-12-02 DIAGNOSIS — E7800 Pure hypercholesterolemia, unspecified: Secondary | ICD-10-CM | POA: Insufficient documentation

## 2016-12-02 DIAGNOSIS — Z79899 Other long term (current) drug therapy: Secondary | ICD-10-CM | POA: Insufficient documentation

## 2016-12-02 DIAGNOSIS — Z8673 Personal history of transient ischemic attack (TIA), and cerebral infarction without residual deficits: Secondary | ICD-10-CM | POA: Insufficient documentation

## 2016-12-02 DIAGNOSIS — Z87891 Personal history of nicotine dependence: Secondary | ICD-10-CM | POA: Insufficient documentation

## 2016-12-02 DIAGNOSIS — F329 Major depressive disorder, single episode, unspecified: Secondary | ICD-10-CM | POA: Insufficient documentation

## 2016-12-02 HISTORY — PX: BOTOX INJECTION: SHX5754

## 2016-12-02 HISTORY — DX: Personal history of other specified conditions: Z87.898

## 2016-12-02 HISTORY — DX: Personal history of other medical treatment: Z92.89

## 2016-12-02 HISTORY — PX: CYSTOSCOPY: SHX5120

## 2016-12-02 LAB — GLUCOSE, CAPILLARY
GLUCOSE-CAPILLARY: 90 mg/dL (ref 65–99)
Glucose-Capillary: 110 mg/dL — ABNORMAL HIGH (ref 65–99)

## 2016-12-02 SURGERY — CYSTOSCOPY
Anesthesia: Monitor Anesthesia Care

## 2016-12-02 MED ORDER — LACTATED RINGERS IV SOLN
INTRAVENOUS | Status: DC
  Administered 2016-12-02: 11:00:00 via INTRAVENOUS

## 2016-12-02 MED ORDER — PROPOFOL 10 MG/ML IV BOLUS
INTRAVENOUS | Status: DC | PRN
  Administered 2016-12-02: 60 mg via INTRAVENOUS
  Administered 2016-12-02: 20 mg via INTRAVENOUS

## 2016-12-02 MED ORDER — ONABOTULINUMTOXINA 100 UNITS IJ SOLR
100.0000 [IU] | Freq: Once | INTRAMUSCULAR | Status: AC
  Administered 2016-12-02: 100 [IU] via INTRAMUSCULAR
  Filled 2016-12-02: qty 100

## 2016-12-02 MED ORDER — CEFAZOLIN SODIUM-DEXTROSE 2-4 GM/100ML-% IV SOLN
2.0000 g | INTRAVENOUS | Status: AC
  Administered 2016-12-02: 2 g via INTRAVENOUS
  Filled 2016-12-02: qty 100

## 2016-12-02 MED ORDER — FENTANYL CITRATE (PF) 100 MCG/2ML IJ SOLN: 25.0000 ug | INTRAMUSCULAR | Status: DC | PRN

## 2016-12-02 MED ORDER — SODIUM CHLORIDE 0.9 % IJ SOLN
INTRAMUSCULAR | Status: DC | PRN
  Administered 2016-12-02: 10 mL

## 2016-12-02 MED ORDER — SODIUM CHLORIDE 0.9 % IJ SOLN
INTRAMUSCULAR | Status: AC
  Filled 2016-12-02: qty 10

## 2016-12-02 MED ORDER — FENTANYL CITRATE (PF) 100 MCG/2ML IJ SOLN
INTRAMUSCULAR | Status: AC
  Filled 2016-12-02: qty 2

## 2016-12-02 MED ORDER — MIDAZOLAM HCL 2 MG/2ML IJ SOLN
INTRAMUSCULAR | Status: AC
  Filled 2016-12-02: qty 2

## 2016-12-02 MED ORDER — FENTANYL CITRATE (PF) 100 MCG/2ML IJ SOLN
INTRAMUSCULAR | Status: DC
Start: 2016-12-02 — End: 2016-12-02
  Filled 2016-12-02: qty 2

## 2016-12-02 MED ORDER — FENTANYL CITRATE (PF) 100 MCG/2ML IJ SOLN
INTRAMUSCULAR | Status: DC | PRN
  Administered 2016-12-02: 25 ug via INTRAVENOUS

## 2016-12-02 MED ORDER — SODIUM CHLORIDE 0.9 % IR SOLN
Status: DC | PRN
  Administered 2016-12-02: 100 mL via INTRAVESICAL

## 2016-12-02 MED ORDER — PROPOFOL 10 MG/ML IV BOLUS
INTRAVENOUS | Status: AC
  Filled 2016-12-02: qty 40

## 2016-12-02 SURGICAL SUPPLY — 7 items
BAG URO CATCHER STRL LF (MISCELLANEOUS) ×3 IMPLANT
CLOTH BEACON ORANGE TIMEOUT ST (SAFETY) IMPLANT
GLOVE BIOGEL M STRL SZ7.5 (GLOVE) ×6 IMPLANT
GOWN STRL REUS W/TWL XL LVL3 (GOWN DISPOSABLE) ×6 IMPLANT
PACK CYSTO (CUSTOM PROCEDURE TRAY) ×3 IMPLANT
TUBING CONNECTING 10 (TUBING) ×2 IMPLANT
TUBING CONNECTING 10' (TUBING) ×1

## 2016-12-02 NOTE — Progress Notes (Signed)
Chest xray 12-11-15 epic Echo 07-26-14 Carotid US  05-13-16  ekg 08-01-16 epic lov dr Ellwood Sayers cardio 08-01-16 epic

## 2016-12-02 NOTE — Discharge Instructions (Signed)
General Anesthesia, Adult, Care After These instructions provide you with information about caring for yourself after your procedure. Your health care provider may also give you more specific instructions. Your treatment has been planned according to current medical practices, but problems sometimes occur. Call your health care provider if you have any problems or questions after your procedure. What can I expect after the procedure? After the procedure, it is common to have:  Vomiting.  A sore throat.  Mental slowness. It is common to feel:  Nauseous.  Cold or shivery.  Sleepy.  Tired.  Sore or achy, even in parts of your body where you did not have surgery. Follow these instructions at home: For at least 24 hours after the procedure:   Do not:  Participate in activities where you could fall or become injured.  Drive.  Use heavy machinery.  Drink alcohol.  Take sleeping pills or medicines that cause drowsiness.  Make important decisions or sign legal documents.  Take care of children on your own.  Rest. Eating and drinking   If you vomit, drink water, juice, or soup when you can drink without vomiting.  Drink enough fluid to keep your urine clear or pale yellow.  Make sure you have little or no nausea before eating solid foods.  Follow the diet recommended by your health care provider. General instructions   Have a responsible adult stay with you until you are awake and alert.  Return to your normal activities as told by your health care provider. Ask your health care provider what activities are safe for you.  Take over-the-counter and prescription medicines only as told by your health care provider.  If you smoke, do not smoke without supervision.  Keep all follow-up visits as told by your health care provider. This is important. Contact a health care provider if:  You continue to have nausea or vomiting at home, and medicines are not helpful.  You  cannot drink fluids or start eating again.  You cannot urinate after 8-12 hours.  You develop a skin rash.  You have fever.  You have increasing redness at the site of your procedure. Get help right away if:  You have difficulty breathing.  You have chest pain.  You have unexpected bleeding.  You feel that you are having a life-threatening or urgent problem. This information is not intended to replace advice given to you by your health care provider. Make sure you discuss any questions you have with your health care provider. Document Released: 12/08/2000 Document Revised: 02/04/2016 Document Reviewed: 08/16/2015 Elsevier Interactive Patient Education  2017 [place].      Botulinum Toxin Bladder Injection, Care After Refer to this sheet in the next few weeks. These instructions provide you with information about caring for yourself after your procedure. Your health care provider may also give you more specific instructions. Your treatment has been planned according to current medical practices, but problems sometimes occur. Call your health care provider if you have any problems or questions after your procedure. What can I expect after the procedure? After the procedure, it is common to have:  Blood-tinged urine.  Burning or soreness when you pass urine. Follow these instructions at home:   Do not drive for 24 hours if you received a sedative.  Take over-the-counter and prescription medicines only as told by your health care provider.  If you were prescribed an antibiotic medicine, take it as told by your health care provider. Do not stop taking the  antibiotic even if you start to feel better.  Drink enough fluid to keep your urine clear or pale yellow.  Return to your normal activities as told by your health care provider. Ask your health care provider what activities are safe for you.  Keep all follow-up visits as told by your health care provider. This is  important. Contact a health care provider if:  You have a fever or chills.  You have blood-tinged urine for more than one day after your procedure.  You have worsening pain or burning when you pass urine.  You have pain or burning when passing urine for more than two days after your procedure.  You have trouble emptying your bladder. Get help right away if:  You have bright red blood in your urine.  You are unable to pass urine. This information is not intended to replace advice given to you by your health care provider. Make sure you discuss any questions you have with your health care provider. Document Released: 05/23/2015 Document Revised: 03/21/2016 Document Reviewed: 02/28/2015 Elsevier Interactive Patient Education  2017 [employer].

## 2016-12-02 NOTE — Progress Notes (Signed)
Called Dr Matilde Sprang that patient has voided and is ready for discharge. He states he will not be coming to see patient. He asks Probation officer to tell patient that surgery went well and his office will call her tomorrow.

## 2016-12-02 NOTE — Op Note (Signed)
Preoperative Diagnosis:  Urge urinary incontinence  Postoperative Diagnosis:  Same  Procedure(s) Performed:   1. Cystourethroscopy 2. Intravesical instillation of botox, 100U  Teaching Surgeon:  Scott MacDiarmid, M.D.  Resident Surgeon:   , MD  Assistant(s):  None  Anesthesia:  Conscious sedation  Fluids:  See anesthesia record  Estimated blood loss:  Minimal  Specimens:  None   Complications:  None  Indications: 82yo female with history of UUI and high volume enuresis currently on myrbetriq & intravesical botox. She did not tolerate her last in-office intravesical botox well due to discomfort and pain, therefore presents today for repeat botox instillation under sedation. She was treated preop with keflex for a positive urine culture and has been on ciprofloxacin perioperative antibiotics as well.  Findings: No intravesical tumors, foreign bodies, stones. Squamous metaplasia of trigone. Bilateral ureteral orifices noted and not involved (nor was trigone) in botox instillation. Excellent hemostasis, no requirement for electrocautery. Minor hypervascularity to bladder mucosa throughout bladder.   Description:  The patient was correctly identified in the preop holding area where written informed consent as well potential risks and complications were reviewed. She agreed. She was brought back to the operative suite. Once correct information was verified, general anesthesia was induced via conscious sedation. They were then gently placed into lithotomy position and prepped and draped in the usual sterile fashion.  Antibiotics were administered and a timeout was then performed.    We inserted the rigid cystoscope was findings noted above. 100U in 10cc normal saline was instilled in 0.5cc increments x 20 instillations into the posterior and inferior bladder wall. Hemostasis was excellent. The bladder was emptied and all instrumentation was removed. The patient tolerated the  procedure well, although did react to almost every single instillation site. She was taken to the recovery unit for routine postoperative care.  Plan: 1. Discharge home when PACU criteria met and voids x 1 2. Follow up as scheduled with Dr. MacDiarmid 3. Resume eliquis tomorrow or if hematuria present wait until POD2 4. Complete entire perioperative course of cipro  Attestation:  Dr. MacDiarmid was present and scrubbed for the entirety of the procedure.       

## 2016-12-02 NOTE — Interval H&P Note (Signed)
History and Physical Interval Note:  12/02/2016 11:05 AM  [name] [name]  has presented today for surgery, with the diagnosis of URGE INCONTINENCE  The various methods of treatment have been discussed with the patient and family. After consideration of risks, benefits and other options for treatment, the patient has consented to  Procedure(s): CYSTOSCOPY (N/A) BOTOX INJECTION (N/A) as a surgical intervention .  The patient's history has been reviewed, patient examined, no change in status, stable for surgery.  I have reviewed the patient's chart and labs.  Questions were answered to the patient's satisfaction.     [name]

## 2016-12-02 NOTE — H&P (Signed)
HPI: [name] [name] has urge incontinence and high volume enuresis. Her incontinence was worse when I saw her last week. [name] memory issues may or may not have been from [place]. She is hoping not to try more antimuscarinics. She did have a leak-point pressure in [name] past of 57 cmH2O and she does leak with coughing and sneezing. She is still on Myrbetriq as a partial responder.   When I saw [name] [name] in April 2017, I had added Myrbetriq and her culture was positive. Her Botox was on December 26, 2015.   [name] [name] is starting having more urge incontinence. She is wearing now 4 pads a day on [name] myrbetriq.   importantly [name] [name] was quite tender with her Botox treatments today. She clinically was not infected. She is on antibiotic. Hassan Rowan will have her try to stop [name] myrbetriq and speak to her daughter.   [name] [name] had Botox 2 weeks ago.  [name] [name] on myrbetriq thinks Botox only helped some. She is not having bedwetting or high-volume episodes but still uses 4 pads a day often mild or moderately wet. Clinically she was not infected but I sent her urine for culture   Her residual is 54 mL   I will reassess her in 1 month. [name] treatment was very tender last time but otherwise went well   [name]. [place] went back in [name] [place]. Clinically she is not infected today. She basically is dry at night which I think is a big improvement but she still uses 4 or 5 pads per day which are damp. Overall [name] double therapy has improvement. She's not sure she would do [name] treatment again. Last time was quite tender as well   Reassess with a residual in 4 months   Today  Clinically [name] [name] was not infected but I sent her urine for culture. She is still on myrbetriq. She is starting to wet [name] bed. She does report some mild or dementia but she did very well today on history. She fell and now uses a walker. She does not want another Botox under local anesthesia because it was so painful. On further review she has had  bedwetting before.   Because of mobility issues I will not get a culture 1 week prior but I gave her a 5 day prescription for ciprofloxacin to start 2 days prior and I will also give her Ancef on [name] day of [name] procedure   [name] [name] actually has high treatment goals and she would rather have [name] procedure done under anesthesia. We will schedule this. Her residual today was 0 mL      ALLERGIES: Amitriptyline HCl TABS Polysaccharide Iron Complex CAPS VESIcare TABS Vicodin TABS    MEDICATIONS: Myrbetriq 50 mg tablet, extended release 24 hr 1 tablet PO Daily  Actos 30 MG Oral Tablet Oral  Aspirin 81 MG TABS Oral  Ciprofloxacin HCl - 250 MG Oral Tablet 1 Oral  Combigan 0.2 %-0.5 % drops Ophthalmic  Crestor 5 mg tablet Oral  Felodipine ER 5 MG Oral Tablet Extended Release 24 Hour Oral  Fluoxetine Hcl 20 mg capsule Oral  GlipiZIDE 5 MG Oral Tablet Oral  Lantus SOLN Subcutaneous  Levothyroxine Sodium 125 MCG Oral Tablet Oral  MetFORMIN HCl - 500 MG Oral Tablet Oral  Metoprolol Succinate ER 25 MG Oral Tablet Extended Release 24 Hour Oral  Namenda 10 mg tablet Oral  Omeprazole 20 MG Oral Capsule Delayed Release Oral  Pravastatin Sodium 40 MG Oral Tablet Oral  Prednisolone Acetate 1 % suspension, drops Ophthalmic  SEROquel 25 MG Oral Tablet Oral  TraMADol HCl - 50 MG Oral Tablet Oral  Tylenol TABS Oral  Vitamin C TABS Oral  Vitamin D2 2,000 unit tablet Oral  Vitamin D3 CAPS Oral  Zetia 10 mg tablet Oral     GU PSH: Cystourethroscopy, W/Injection For Chemodenervation Of Bladder - 05/22/2016     PSH Notes: Gynecologic Services     NON-GU PSH: CABG (coronary artery bypass grafting) - Dec 23, 2012 Cataract Surgery.. Cholecystectomy (open) Hand/finger Surgery Revise Knee Joint - December 24, 2007     GU PMH: Urge incontinence, Urge incontinence of urine - 01/30/2016 Urinary Frequency, Increased urinary frequency - 01/30/2016 Urinary Tract Inf, Unspec site, Urinary tract infection -  01/14/2016 Mixed incontinence, Urge and stress incontinence - 01/09/2016 Enuresis, Nocturnal, Enuresis, nocturnal only - 12/01/2014 Urinary Urgency, Urinary urgency - Dec 23, 2013     PMH Notes:  2008-03-30 12:04:54 - Note: Arthritis   NON-GU PMH: Encounter for general adult medical examination without abnormal findings, Encounter for preventive health examination - 11/26/2015 Muscle weakness (generalized), Muscle weakness - 12-23-13 Other lack of coordination, Muscular incoordination - Dec 23, 2013 Anxiety, Anxiety - 2012-12-23 Cerebral infarction, unspecified, Stroke Syndrome - 23-Dec-2012 Personal history of other diseases of [name] circulatory system, History of hypertension - 23-Dec-2012 Personal history of other diseases of [name] nervous system and sense organs, History of glaucoma - 12-23-2012 Personal history of other endocrine, nutritional and metabolic disease, History of hypercholesterolemia - 12-23-2012, History of type 2 diabetes mellitus, - 2012/12/23, History of hypothyroidism, - 12-23-12 Personal history of other mental and behavioral disorders, History of depression - 12-23-12 Personal history of other specified conditions, History of heartburn - 2012-12-23    FAMILY HISTORY: Death In [name] Family Father - Father Death In [name] Family Mother - Mother Family Health Status Number - Runs In Family Heart Disease - Mother Stroke Syndrome - Father   SOCIAL HISTORY: Marital Status: Divorced Drinks 2 caffeinated drinks per day.    REVIEW OF SYSTEMS:    GU Review Female:  [name] reports get up at night to urinate and leakage of urine. [name] denies frequent urination, hard to postpone urination, burning /pain with urination, stream starts and stops, trouble starting your stream, have to strain to urinate, and currently pregnant.  Gastrointestinal (Upper):  [name] denies nausea, vomiting, and indigestion/ heartburn.  Gastrointestinal (Lower):  [name] denies diarrhea and constipation.  Constitutional:  [name] denies fever, night sweats, weight  loss, and fatigue.  Skin:  [name] denies skin rash/ lesion and itching.  Eyes:  [name] denies blurred vision and double vision.  Ears/ Nose/ Throat:  [name] denies sore throat and sinus problems.  Hematologic/Lymphatic:  [name] denies swollen glands and easy bruising.  Cardiovascular:  [name] denies leg swelling and chest pains.  Respiratory:  [name] denies cough and shortness of breath.  Endocrine:  [name] denies excessive thirst.  Musculoskeletal:  [name] denies back pain and joint pain.  Neurological:  [name] denies headaches and dizziness.  Psychologic:  [name] denies depression and anxiety.   VITAL SIGNS:         11/10/2016 12:04 PM   Weight 187 lb / 84.82 kg   Height 60 in / 152.4 cm   BP 117/68 mmHg   Pulse 74 /min   Temperature 98.8 F / 37 C   BMI 36.5 kg/m   PAST DATA REVIEWED:   Source Of History:  [name]   PROCEDURES:    Urinalysis w/Scope  Dipstick Dipstick Cont'd Micro  Color: Yellow  Bilirubin: Neg WBC/hpf: >60/hpf  Appearance: Cloudy Ketones: Neg RBC/hpf: 3 - 10/hpf  Specific Gravity: 1.020 Blood: 2+ Bacteria: Many (>50/hpf)  pH: 5.5 Protein: 1+ Cystals: NS (Not Seen)  Glucose: Neg Urobilinogen: 0.2 Casts: NS (Not Seen)   Nitrites: Neg Trichomonas: Not Present   Leukocyte Esterase: 3+ Mucous: Not Present    Epithelial Cells: 0 - 5/hpf    Yeast: NS (Not Seen)    Sperm: Not Present    ASSESSMENT:     ICD-10 Details  1 GU:  Enuresis, Nocturnal - N39.44   2  Urge incontinence - N39.41    After a thorough review of [name] management options for [name] [name]'s condition [name] [name]  elected to proceed with surgical therapy as noted above. We have discussed [name] potential benefits and risks of [name] procedure, side effects of [name] proposed treatment, [name] likelihood of [name] [name] achieving [name] goals of [name] procedure, and any potential problems that might occur during [name] procedure or recuperation. Informed consent has been  obtained.

## 2016-12-02 NOTE — Anesthesia Postprocedure Evaluation (Addendum)
Anesthesia Post Note  Patient: [name] [name]  Procedure(s) Performed: Procedure(s) (LRB): CYSTOSCOPY (N/A) BOTOX INJECTION (N/A)  Patient location during evaluation: PACU Anesthesia Type: MAC Level of consciousness: awake and alert Pain management: pain level controlled Vital Signs Assessment: post-procedure vital signs reviewed and stable Respiratory status: spontaneous breathing, nonlabored ventilation, respiratory function stable and patient connected to nasal cannula oxygen Cardiovascular status: stable and blood pressure returned to baseline Anesthetic complications: no       Last Vitals:  Vitals:   12/02/16 1329 12/02/16 1418  BP: (!) 130/55 130/77  Pulse: (!) 53 (!) 57  Resp: 18 18  Temp: 36.4 C 36.6 C    Last Pain:  Vitals:   12/02/16 1418  TempSrc: Oral  PainSc: 0-No pain                 Lyrick Lagrand,JAMES TERRILL

## 2016-12-02 NOTE — Interval H&P Note (Signed)
History and Physical Interval Note:  12/02/2016 11:05 AM  [name] [name]  has presented today for surgery, with the diagnosis of URGE INCONTINENCE  The various methods of treatment have been discussed with the patient and family. After consideration of risks, benefits and other options for treatment, the patient has consented to  Procedure(s): CYSTOSCOPY (N/A) BOTOX INJECTION (N/A) as a surgical intervention .  The patient's history has been reviewed, patient examined, no change in status, stable for surgery.  I have reviewed the patient's chart and labs.  Questions were answered to the patient's satisfaction.     Joshalyn Ancheta A

## 2016-12-02 NOTE — Anesthesia Preprocedure Evaluation (Addendum)
Anesthesia Evaluation  Patient identified by MRN, date of birth, ID band Patient awake    Reviewed: Allergy & Precautions, NPO status , Patient's Chart, lab work & pertinent test results  Airway Mallampati: II  TM Distance: >3 FB Neck ROM: Full    Dental no notable dental hx.    Pulmonary former smoker,    breath sounds clear to auscultation       Cardiovascular hypertension, + CAD and + Peripheral Vascular Disease   Rhythm:Regular Rate:Normal     Neuro/Psych  Neuromuscular disease CVA    GI/Hepatic hiatal hernia, GERD  ,  Endo/Other  diabetesHypothyroidism   Renal/GU      Musculoskeletal  (+) Arthritis ,   Abdominal (+) + obese,   Peds  Hematology  (+) anemia ,   Anesthesia Other Findings   Reproductive/Obstetrics                            Anesthesia Physical Anesthesia Plan  ASA: III  Anesthesia Plan: MAC   Post-op Pain Management:    Induction:   Airway Management Planned: Natural Airway and Simple Face Mask  Additional Equipment:   Intra-op Plan:   Post-operative Plan:   Informed Consent: I have reviewed the patients History and Physical, chart, labs and discussed the procedure including the risks, benefits and alternatives for the proposed anesthesia with the patient or authorized representative who has indicated his/her understanding and acceptance.     Plan Discussed with:   Anesthesia Plan Comments:         Anesthesia Quick Evaluation

## 2016-12-02 NOTE — Transfer of Care (Signed)
Immediate Anesthesia Transfer of Care Note  Patient: [name] [name]  Procedure(s) Performed: Procedure(s): CYSTOSCOPY (N/A) BOTOX INJECTION (N/A)  Patient Location: PACU  Anesthesia Type:MAC combined with regional for post-op pain  Level of Consciousness: alert , oriented and patient cooperative  Airway & Oxygen Therapy: Patient connected to face mask oxygen  Post-op Assessment: Report given to RN and Post -op Vital signs reviewed and stable  Post vital signs: stable  Last Vitals:  Vitals:   12/02/16 1029  BP: (!) 115/46  Pulse: 100  Resp: 18  Temp: 36.6 C    Last Pain:  Vitals:   12/02/16 1048  TempSrc:   PainSc: 0-No pain         Complications: No apparent anesthesia complications

## 2016-12-03 ENCOUNTER — Encounter (HOSPITAL_COMMUNITY): Payer: Self-pay | Admitting: Urology

## 2016-12-03 LAB — HEMOGLOBIN A1C
Hgb A1c MFr Bld: 7.1 % — ABNORMAL HIGH (ref 4.8–5.6)
Mean Plasma Glucose: 157 mg/dL

## 2016-12-08 ENCOUNTER — Other Ambulatory Visit: Payer: Self-pay | Admitting: *Deleted

## 2016-12-08 DIAGNOSIS — I6523 Occlusion and stenosis of bilateral carotid arteries: Secondary | ICD-10-CM

## 2016-12-08 DIAGNOSIS — Z48812 Encounter for surgical aftercare following surgery on the circulatory system: Secondary | ICD-10-CM

## 2016-12-09 ENCOUNTER — Ambulatory Visit (HOSPITAL_COMMUNITY)
Admission: RE | Admit: 2016-12-09 | Discharge: 2016-12-09 | Disposition: A | Discharge status: Home / Self Care | Payer: Medicare Other | Source: Ambulatory Visit | Attending: Vascular Surgery | Admitting: Vascular Surgery

## 2016-12-09 ENCOUNTER — Encounter: Payer: Self-pay | Admitting: Vascular Surgery

## 2016-12-09 ENCOUNTER — Ambulatory Visit (INDEPENDENT_AMBULATORY_CARE_PROVIDER_SITE_OTHER): Payer: Medicare Other | Admitting: Vascular Surgery

## 2016-12-09 VITALS — BP 140/67 | HR 81 | Temp 98.1°F | Resp 18 | Ht 60.0 in | Wt 190.8 lb

## 2016-12-09 DIAGNOSIS — I6523 Occlusion and stenosis of bilateral carotid arteries: Secondary | ICD-10-CM

## 2016-12-09 DIAGNOSIS — Z48812 Encounter for surgical aftercare following surgery on the circulatory system: Secondary | ICD-10-CM

## 2016-12-09 LAB — VAS US CAROTID
LCCADDIAS: 23 cm/s
LCCAPSYS: 84 cm/s
Left CCA dist sys: 88 cm/s
Left CCA prox dias: 20 cm/s
Left ICA dist dias: -25 cm/s
Left ICA dist sys: -70 cm/s
Left ICA prox dias: -36 cm/s
Left ICA prox sys: -100 cm/s
RCCADSYS: -75 cm/s
RIGHT CCA MID DIAS: 14 cm/s
RIGHT ECA DIAS: 7 cm/s
Right CCA prox dias: 16 cm/s
Right CCA prox sys: 93 cm/s

## 2016-12-09 NOTE — Progress Notes (Signed)
[facility] [place]  Patient name: [name] [name] MRN: [id number] DOB: August 25, 1934 Sex: female  REASON FOR VISIT: Follow-up carotid disease.  HPI: [name] [name] is a 83 y.o. female ear today for follow-up of carotid disease. She underwent left carotid endarterectomy and resection of redundant internal carotid artery by myself in January 2017. She is here today with her caregiver. She does have a somewhat progressive dementia. She is able to function with assistance. She has known peripheral arterial disease and this had for years chronic small ulcerations over her feet which she has been able to heal with conservative treatment. She continues to see Dr.Tuchman for this. She has no arterial rest pain.  Past Medical History:  Diagnosis Date  . Anemia   . Anxiety   . Atrial fibrillation ([place])   . Bilateral carotid artery stenosis   . Bowel incontinence   . CAD (coronary artery disease)    a. s/p CABG 2012.  [name] Chronic lower back pain    "back is very stiff because of fusion; can't stand up straight more than few moments"  . Closed fracture of unspecified part of neck of femur   . Complication of anesthesia    anesth. meds caused hallucinations   . Cough    no fever, chronic  . Degenerative joint disease   . Dementia   . Depression   . Depressive disorder, not elsewhere classified   . Diverticulitis   . Endometriosis   . Fall at home 05/15/2013  . Female stress incontinence   . GERD (gastroesophageal reflux disease)   . Headache   . Hiatal hernia   . History of blood transfusion   . History of bronchitis   . History of wheezing   . HLD (hyperlipidemia)   . Hx of cardiovascular stress test    Lexiscan Myoview (11/15):  Low risk stress nuclear study with a small, mild fixed apical septal perfusion defect. EF is 52% with mild septal hypokinesis. No ischemia.  [name] Hx of echocardiogram    Echo (11/15):  Mod LVH, severe septal  hypertrophy (suggestive HCM), no LVOT obstruction, rest gradient 15 mmHg across AV, vigorous LVF, EF 65-70%, no RWMA, Gr 1 DD, Ao sclerosis (no stenosis), mod LAE, mild RAE, mod TR, PASP 42 mmHg  . Hypertension   . Hypertonicity of bladder   . Hypertrophic cardiomyopathy ([place])   . Hypothyroidism   . Lumbago   . Muscle weakness (generalized)   . Obesity   . Osteoarthrosis, unspecified whether generalized or localized, unspecified site   . Progressive dementia with uncertain etiology 6/78/9381   Suspect Lewy body .  [name] Reflux esophagitis   . Stroke [facility]) 2011   "old very small TIA/neurologist"  . Type II diabetes mellitus ([place])   . Ulcer ([place])    left foot x 2 years sees dr Little Ishikawa podiastrist for wound staying same tiny wart appearing areas no drainage changes dressing every 3 days  . Unspecified glaucoma(365.9)     Family History  Problem Relation Age of Onset  . Cancer Cousin   . Heart attack Mother   . Hypertension Mother   . Heart attack Father   . Heart disease Father     Before age 74  . Hyperlipidemia Father   . Hypertension Father   . Cancer Paternal Aunt     SOCIAL HISTORY: Social History  Substance Use Topics  . Smoking status: Former Smoker    Packs/day: 1.50  Types: Cigarettes    Quit date: 09/16/1987  . Smokeless tobacco: Never Used  . Alcohol use No    Allergies  Allergen Reactions  . Amitriptyline Other (See Comments)    hallucinations  . Vesicare [Solifenacin] Other (See Comments)    hallucinations  . Vicodin [Hydrocodone-Acetaminophen] Other (See Comments)    Hallucinations - do NOT verify Vicodin orders  . Polysaccharide Iron Complex Hives, Itching and Rash    Patient can tolerate Ferrous Sulfate    Current Outpatient Prescriptions  Medication Sig Dispense Refill  . acetaminophen (TYLENOL) 650 MG CR tablet Take 650 mg by mouth every 8 (eight) hours as needed for pain.    [name] apixaban (ELIQUIS) 5 MG TABS tablet Take 1 tablet (5 mg total) by  mouth 2 (two) times daily. 60 tablet 3  . Ascorbic Acid (VITAMIN C) 1000 MG tablet Take 1,000 mg by mouth 3 (three) times a week.     . cholecalciferol (VITAMIN D) 1000 UNITS tablet Take 1,000 Units by mouth See admin instructions. On Monday, Tuesday, Wednesday, Friday, Saturday.    . COMBIGAN 0.2-0.5 % ophthalmic solution Place 1-2 drops into both eyes daily.   0  . donepezil (ARICEPT) 10 MG tablet Take 1 tablet (10 mg total) by mouth at bedtime. (Patient taking differently: Take 5 mg by mouth at bedtime. ) 90 tablet 3  . ergocalciferol (VITAMIN D2) 50000 UNITS capsule Take 50,000 Units by mouth 2 (two) times a week. On Thursday and Sunday    . felodipine (PLENDIL) 10 MG 24 hr tablet Take 10 mg by mouth daily.    [name] FLUoxetine (PROZAC) 20 MG capsule Take 20 mg by mouth every morning.     [name] glipiZIDE (GLUCOTROL XL) 5 MG 24 hr tablet Take 5 mg by mouth at bedtime.     . Insulin Glargine (LANTUS SOLOSTAR) 100 UNIT/ML SOPN Inject 10 Units into the skin at bedtime.     [name] JANUVIA 100 MG tablet Take 100 mg by mouth every morning.     [name] levothyroxine (SYNTHROID, LEVOTHROID) 125 MCG tablet Take 125 mcg by mouth daily before breakfast.     . losartan (COZAAR) 25 MG tablet Take 25 mg by mouth daily.   0  . memantine (NAMENDA) 10 MG tablet Take 1 tablet (10 mg total) by mouth 2 (two) times daily. 180 tablet 3  . metoprolol tartrate (LOPRESSOR) 25 MG tablet Take 25 mg by mouth 2 (two) times daily.      . mirabegron ER (MYRBETRIQ) 50 MG TB24 Take 50 mg by mouth every evening.    . pioglitazone (ACTOS) 30 MG tablet Take 30 mg by mouth every morning.     . pravastatin (PRAVACHOL) 80 MG tablet take 1 tablet by mouth once daily 90 tablet 3  . prednisoLONE sodium phosphate (INFLAMASE FORTE) 1 % ophthalmic solution Place 1 drop into the right eye 3 (three) times daily.    . QUEtiapine (SEROQUEL) 25 MG tablet Take 25 mg by mouth at bedtime.    . silver sulfADIAZINE (SILVADENE) 1 % cream Apply 1 application topically  daily. 50 g 0   No current facility-administered medications for this visit.     REVIEW OF SYSTEMS:  [X]  denotes positive finding, [ ]  denotes negative finding Cardiac  Comments:  Chest pain or chest pressure:    Shortness of breath upon exertion:    Short of breath when lying flat:    Irregular heart rhythm:        Vascular  Pain in calf, thigh, or hip brought on by ambulation:    Pain in feet at night that wakes you up from your sleep:     Blood clot in your veins:    Leg swelling:           PHYSICAL EXAM: Vitals:   12/09/16 1055 12/09/16 1057  BP: (!) 151/65 140/67  Pulse: 81   Resp: 18   Temp: 98.1 F (36.7 C)   TempSrc: Oral   SpO2: 97%   Weight: 190 lb 12.8 oz (86.5 kg)   Height: 5' (1.524 m)     GENERAL: The patient is a well-nourished female, in no acute distress. The vital signs are documented above. CARDIOVASCULAR: Well-healed left carotid incision with no bruits bilaterally. 2+ radial pulses bilaterally PULMONARY: There is good air exchange  MUSCULOSKELETAL: There are no major deformities or cyanosis. NEUROLOGIC: No focal weakness or paresthesias are detected. SKIN: There are no ulcers or rashes noted. PSYCHIATRIC: The patient has a normal affect.  DATA:  Carotid duplex today reveals no change with a widely patent endarterectomy on the left and moderate 40-59% stenosis in the right internal carotid artery  MEDICAL ISSUES: Stable overall. We'll continue her usual activities. We'll see her again in one year with repeat carotid duplex follow-up. She'll notify should she develop any neurologic deficits    Rosetta Posner, MD [facility] Tel [phone] Pager [phone]

## 2016-12-31 ENCOUNTER — Ambulatory Visit: Payer: Medicare Other | Admitting: Podiatry

## 2017-01-07 ENCOUNTER — Telehealth: Payer: Self-pay | Admitting: Neurology

## 2017-01-07 NOTE — Telephone Encounter (Signed)
I called [name] back and left a message. It would be best if patient could be evaluated by Dr. Brett Fairy. I stated that as of right now Dr. Brett Fairy has an opening on 5/1. I asked her to call back and see if we can get her in sooner.

## 2017-01-07 NOTE — Telephone Encounter (Signed)
Patients daughter [name] (listed on DPR) called office in reference to patient having increased nightmares and hallucinations over the past month month and half.  Patient currently taking QUEtiapine (SEROQUEL) 25 MG tablet.  Per daughter nightmares seem to be on a more recurring basis.  Please call

## 2017-01-20 ENCOUNTER — Emergency Department (HOSPITAL_COMMUNITY): Payer: Medicare Other

## 2017-01-20 ENCOUNTER — Encounter (HOSPITAL_COMMUNITY): Payer: Self-pay | Admitting: Emergency Medicine

## 2017-01-20 ENCOUNTER — Emergency Department (HOSPITAL_COMMUNITY)
Admission: EM | Admit: 2017-01-20 | Discharge: 2017-01-20 | Disposition: A | Discharge status: Home / Self Care | Payer: Medicare Other | Attending: Emergency Medicine | Admitting: Emergency Medicine

## 2017-01-20 DIAGNOSIS — Y92009 Unspecified place in unspecified non-institutional (private) residence as the place of occurrence of the external cause: Secondary | ICD-10-CM | POA: Insufficient documentation

## 2017-01-20 DIAGNOSIS — S7002XA Contusion of left hip, initial encounter: Secondary | ICD-10-CM | POA: Insufficient documentation

## 2017-01-20 DIAGNOSIS — Z7901 Long term (current) use of anticoagulants: Secondary | ICD-10-CM | POA: Insufficient documentation

## 2017-01-20 DIAGNOSIS — W19XXXA Unspecified fall, initial encounter: Secondary | ICD-10-CM

## 2017-01-20 DIAGNOSIS — Z794 Long term (current) use of insulin: Secondary | ICD-10-CM | POA: Insufficient documentation

## 2017-01-20 DIAGNOSIS — Z96651 Presence of right artificial knee joint: Secondary | ICD-10-CM | POA: Insufficient documentation

## 2017-01-20 DIAGNOSIS — Y9389 Activity, other specified: Secondary | ICD-10-CM | POA: Insufficient documentation

## 2017-01-20 DIAGNOSIS — Z87891 Personal history of nicotine dependence: Secondary | ICD-10-CM | POA: Insufficient documentation

## 2017-01-20 DIAGNOSIS — Z79899 Other long term (current) drug therapy: Secondary | ICD-10-CM | POA: Insufficient documentation

## 2017-01-20 DIAGNOSIS — Y999 Unspecified external cause status: Secondary | ICD-10-CM | POA: Insufficient documentation

## 2017-01-20 DIAGNOSIS — R51 Headache: Secondary | ICD-10-CM | POA: Insufficient documentation

## 2017-01-20 DIAGNOSIS — S79912A Unspecified injury of left hip, initial encounter: Secondary | ICD-10-CM | POA: Diagnosis present

## 2017-01-20 DIAGNOSIS — I2510 Atherosclerotic heart disease of native coronary artery without angina pectoris: Secondary | ICD-10-CM | POA: Diagnosis not present

## 2017-01-20 DIAGNOSIS — Z8673 Personal history of transient ischemic attack (TIA), and cerebral infarction without residual deficits: Secondary | ICD-10-CM | POA: Insufficient documentation

## 2017-01-20 DIAGNOSIS — Z96611 Presence of right artificial shoulder joint: Secondary | ICD-10-CM | POA: Insufficient documentation

## 2017-01-20 DIAGNOSIS — W1839XA Other fall on same level, initial encounter: Secondary | ICD-10-CM | POA: Diagnosis not present

## 2017-01-20 DIAGNOSIS — E119 Type 2 diabetes mellitus without complications: Secondary | ICD-10-CM | POA: Diagnosis not present

## 2017-01-20 DIAGNOSIS — S20212A Contusion of left front wall of thorax, initial encounter: Secondary | ICD-10-CM

## 2017-01-20 NOTE — Discharge Instructions (Signed)
Today your evaluated after fall in your home.  Your x-rays rolled within normal parameters.  You were ambulated in the emergency department with a walker, UR sore, but there are no fractures or broken bones.  Please continue take Tylenol for discomfort as needed.  Follow-up with your primary care physician.

## 2017-01-20 NOTE — ED Provider Notes (Signed)
[facility] DEPT Provider Note   CSN: [id number] Arrival date & time: 01/20/17  3329     History   Chief Complaint Chief Complaint  Patient presents with  . Fall    HPI [name] [name] is a 83 y.o. female.  .  This is an 83 year old female who uses a walker in her home.  She got her feet tangled in the walker when she tried to walk between the wall and a chair.  She sat down hard.  She was unable to get up on her and she scoo a phone and called for help [facility] arrived and transported for further evaluation. .  She is complaining of left rib and left hip pain.  Denies hitting her head or back.       Past Medical History:  Diagnosis Date  . Anemia   . Anxiety   . Atrial fibrillation ([place])   . Bilateral carotid artery stenosis   . Bowel incontinence   . CAD (coronary artery disease)    a. s/p CABG 2012.  [name] Chronic lower back pain    "back is very stiff because of fusion; can't stand up straight more than few moments"  . Closed fracture of unspecified part of neck of femur   . Complication of anesthesia    anesth. meds caused hallucinations   . Cough    no fever, chronic  . Degenerative joint disease   . Dementia   . Depression   . Depressive disorder, not elsewhere classified   . Diverticulitis   . Endometriosis   . Fall at home 05/15/2013  . Female stress incontinence   . GERD (gastroesophageal reflux disease)   . Headache   . Hiatal hernia   . History of blood transfusion   . History of bronchitis   . History of wheezing   . HLD (hyperlipidemia)   . Hx of cardiovascular stress test    Lexiscan Myoview (11/15):  Low risk stress nuclear study with a small, mild fixed apical septal perfusion defect. EF is 52% with mild septal hypokinesis. No ischemia.  [name] Hx of echocardiogram    Echo (11/15):  Mod LVH, severe septal hypertrophy (suggestive HCM), no LVOT obstruction, rest gradient 15 mmHg across AV, vigorous LVF, EF 65-70%, no RWMA, Gr 1 DD, Ao sclerosis (no  stenosis), mod LAE, mild RAE, mod TR, PASP 42 mmHg  . Hypertension   . Hypertonicity of bladder   . Hypertrophic cardiomyopathy ([place])   . Hypothyroidism   . Lumbago   . Muscle weakness (generalized)   . Obesity   . Osteoarthrosis, unspecified whether generalized or localized, unspecified site   . Progressive dementia with uncertain etiology 01/31/8415   Suspect Lewy body .  [name] Reflux esophagitis   . Stroke [facility]) 2011   "old very small TIA/neurologist"  . Type II diabetes mellitus ([place])   . Ulcer    left foot x 2 years sees dr Little Ishikawa podiastrist for wound staying same tiny wart appearing areas no drainage changes dressing every 3 days  . Unspecified glaucoma(365.9)     Patient Active Problem List   Diagnosis Date Noted  . Late onset Alzheimer's disease without behavioral disturbance 02/12/2016  . Atrial fibrillation ([place]) 02/12/2016  . Gastroparesis due to DM ([place]) 02/12/2016  . Diabetes mellitus due to underlying condition with diabetic retinopathy 02/12/2016  . Carotid stenosis 10/01/2015  . Abnormality of gait 08/13/2015  . Hypertrophic cardiomyopathy ([place]) 08/30/2014  . Other specified transient cerebral ischemias 08/02/2014  .  Chronic atrial fibrillation ([place]) 08/02/2014  . Lewy body dementia with behavioral disturbance 02/21/2014  . Peripheral vascular disease ([place]) 01/18/2014  . [place] post total shoulder arthroplasty 12/15/2013  . DM2 (diabetes mellitus, type 2) ([place]) 12/08/2013  . Constipation 12/08/2013  . Hyperlipemia 12/08/2013  . Dementia 12/08/2013  . Unspecified hypothyroidism 10/30/2013  . Osteoarthritis of right shoulder 10/07/2013  . Fall at home 05/15/2013  . Moderate vision impairment-both eyes 05/15/2013  . Progressive dementia with uncertain etiology 16/06/9603  . Obesity (BMI 30-39.9) 05/15/2013  . Rhegmatogenous retinal detachment of right eye 01/10/2013  . Hypertension   . Thyroid disease   . Depression   . IDDM (insulin dependent diabetes  mellitus) ([place])   . GERD (gastroesophageal reflux disease)   . CAD (coronary artery disease)   . Bilateral carotid artery stenosis   . Obesity   . Degenerative joint disease   . Hiatal hernia     Past Surgical History:  Procedure Laterality Date  . BACK SURGERY     X2 lower  . BOTOX INJECTION N/A 12/02/2016   Procedure: BOTOX INJECTION;  Surgeon: Bjorn Loser, MD;  Location: [facility];  Service: Urology;  Laterality: N/A;  . CARDIAC CATHETERIZATION    . CARPAL TUNNEL RELEASE Bilateral   . CATARACT EXTRACTION W/ INTRAOCULAR LENS  IMPLANT, BILATERAL    . CHOLECYSTECTOMY    . CORONARY ARTERY BYPASS GRAFT  04/17/2011   LT  internal mammary artery to left anterior descending, saphenous vein graft first diagonal, saphenous  vein graft to obtuse marginal 1, saphenous vein graft to posterior descending  . CYSTOSCOPY N/A 12/02/2016   Procedure: CYSTOSCOPY;  Surgeon: Bjorn Loser, MD;  Location: [facility];  Service: Urology;  Laterality: N/A;  . DILATION AND CURETTAGE OF UTERUS     "for endometrosis"  . DILATION AND CURETTAGE OF UTERUS     ENDOMETRIOSIS  . ENDARTERECTOMY Left 10/01/2015   Procedure: Left CAROTID Endartarectomy;  Surgeon: Rosetta Posner, MD;  Location: [facility];  Service: Vascular;  Laterality: Left;  . EYE SURGERY    . HAND SURGERY     "for arthritis; right hand"  . JOINT REPLACEMENT    . LASER PHOTO ABLATION Right 01/20/2013   Procedure: LASER PHOTO ABLATION;  Surgeon: Hayden Pedro, MD;  Location: [facility];  Service: Ophthalmology;  Laterality: Right;  . LOWER EXTREMITY ANGIOGRAM N/A 03/10/2012   Procedure: LOWER EXTREMITY ANGIOGRAM;  Surgeon: Jettie Booze, MD;  Location: [facility] CATH LAB;  Service: Cardiovascular;  Laterality: N/A;  . NEUROPLASTY / TRANSPOSITION MEDIAN NERVE AT CARPAL TUNNEL BILATERAL    . PARTIAL HIP ARTHROPLASTY  11/2009   left; "just the ball"  . PERIPHERAL VASCULAR CATHETERIZATION N/A 05/16/2015   Procedure: Abdominal Aortogram;  Surgeon: Elam Dutch,  MD;  Location: [facility] CV LAB;  Service: Cardiovascular;  Laterality: N/A;  . PERIPHERAL VASCULAR CATHETERIZATION Left 05/16/2015   Procedure: Lower Extremity Angiography;  Surgeon: Elam Dutch, MD;  Location: [facility] CV LAB;  Service: Cardiovascular;  Laterality: Left;  . POSTERIOR FUSION LUMBAR SPINE     "removed L4-5, S1"  . PTCA  03/10/12   LLE  . RETINAL DETACHMENT SURGERY Right May 2014  . SCLERAL BUCKLE Right 01/20/2013   Procedure: SCLERAL BUCKLE;  Surgeon: Hayden Pedro, MD;  Location: [facility];  Service: Ophthalmology;  Laterality: Right;  . TOTAL KNEE ARTHROPLASTY  ~ 2004   right  . TOTAL SHOULDER ARTHROPLASTY Right 10/07/2013   Procedure: TOTAL SHOULDER ARTHROPLASTY;  Surgeon:  Alta Corning, MD;  Location: [facility];  Service: Orthopedics;  Laterality: Right;    OB History    No data available       Home Medications    Prior to Admission medications   Medication Sig Start Date End Date Taking? Authorizing Provider  acetaminophen (TYLENOL) 650 MG CR tablet Take 650 mg by mouth every 8 (eight) hours as needed for pain.    [provider]  apixaban (ELIQUIS) 5 MG TABS tablet Take 1 tablet (5 mg total) by mouth 2 (two) times daily. 06/02/16   Jettie Booze, MD  Ascorbic Acid (VITAMIN C) 1000 MG tablet Take 1,000 mg by mouth 3 (three) times a week.     [provider]  cholecalciferol (VITAMIN D) 1000 UNITS tablet Take 1,000 Units by mouth See admin instructions. On Monday, Tuesday, Wednesday, Friday, Saturday.    [provider]  COMBIGAN 0.2-0.5 % ophthalmic solution Place 1-2 drops into both eyes daily.  01/09/15   [provider]  donepezil (ARICEPT) 10 MG tablet Take 1 tablet (10 mg total) by mouth at bedtime. Patient taking differently: Take 5 mg by mouth at bedtime.  08/19/16   Dohmeier, Asencion Partridge, MD  ergocalciferol (VITAMIN D2) 50000 UNITS capsule Take 50,000 Units by mouth 2 (two) times a week. On Thursday and Sunday     [provider]  felodipine (PLENDIL) 10 MG 24 hr tablet Take 10 mg by mouth daily.    [provider]  FLUoxetine (PROZAC) 20 MG capsule Take 20 mg by mouth every morning.     [provider]  glipiZIDE (GLUCOTROL XL) 5 MG 24 hr tablet Take 5 mg by mouth at bedtime.     [provider]  Insulin Glargine (LANTUS SOLOSTAR) 100 UNIT/ML SOPN Inject 10 Units into the skin at bedtime.     [provider]  JANUVIA 100 MG tablet Take 100 mg by mouth every morning.  08/14/16   [provider]  levothyroxine (SYNTHROID, LEVOTHROID) 125 MCG tablet Take 125 mcg by mouth daily before breakfast.     [provider]  losartan (COZAAR) 25 MG tablet Take 25 mg by mouth daily.  05/22/16   [provider]  memantine (NAMENDA) 10 MG tablet Take 1 tablet (10 mg total) by mouth 2 (two) times daily. 08/19/16   Dohmeier, Asencion Partridge, MD  metoprolol tartrate (LOPRESSOR) 25 MG tablet Take 25 mg by mouth 2 (two) times daily.      [provider]  mirabegron ER (MYRBETRIQ) 50 MG TB24 Take 50 mg by mouth every evening.    [provider]  pioglitazone (ACTOS) 30 MG tablet Take 30 mg by mouth every morning.     [provider]  pravastatin (PRAVACHOL) 80 MG tablet take 1 tablet by mouth once daily 06/11/16   Jettie Booze, MD  prednisoLONE sodium phosphate (INFLAMASE FORTE) 1 % ophthalmic solution Place 1 drop into the right eye 3 (three) times daily.    [provider]  QUEtiapine (SEROQUEL) 25 MG tablet Take 25 mg by mouth at bedtime.    [provider]  silver sulfADIAZINE (SILVADENE) 1 % cream Apply 1 application topically daily. 11/19/16   Gean Birchwood, DPM    Family History Family History  Problem Relation Age of Onset  . Cancer Cousin   . Heart attack Mother   . Hypertension Mother   . Heart attack Father   . Heart disease Father     Before  age 54  . Hyperlipidemia Father   . Hypertension  Father   . Cancer Paternal Aunt     Social History Social History  Substance Use Topics  . Smoking status: Former Smoker    Packs/day: 1.50    Types: Cigarettes    Quit date: 09/16/1987  . Smokeless tobacco: Never Used  . Alcohol use No     Allergies   Amitriptyline; Vesicare [solifenacin]; Vicodin [hydrocodone-acetaminophen]; and Polysaccharide iron complex   Review of Systems Review of Systems  Constitutional: Negative for fever.  Respiratory: Negative for shortness of breath.   Cardiovascular: Positive for chest pain.  Musculoskeletal: Negative for back pain, joint swelling and neck pain.  Skin: Negative for wound.  Neurological: Negative for dizziness and headaches.  All other systems reviewed and are negative.    Physical Exam Updated Vital Signs BP (!) 153/64   Pulse 60   Temp 97.6 F (36.4 C) (Oral)   Resp 15   Ht 5' (1.524 m)   Wt 84.8 kg   SpO2 97%   BMI 36.52 kg/m   Physical Exam  Constitutional: She appears well-developed and well-nourished.  HENT:  Head: Normocephalic.  Eyes: Pupils are equal, round, and reactive to light.  Neck: Normal range of motion.  Cardiovascular: Normal rate.   Pulmonary/Chest: Effort normal.  Abdominal: Soft.  Musculoskeletal: Normal range of motion. She exhibits no tenderness.       Arms:      Legs: Neurological: She is alert.  Psychiatric: She has a normal mood and affect.  Nursing note and vitals reviewed.    ED Treatments / Results  Labs (all labs ordered are listed, but only abnormal results are displayed) Labs Reviewed - No data to display  EKG  EKG Interpretation None       Radiology Dg Chest 2 View  Result Date: 01/20/2017 CLINICAL DATA:  Patient fell tonight at home.  Left hip pain. EXAM: CHEST  2 VIEW COMPARISON:  12/11/2015 FINDINGS: Postoperative changes in the mediastinum. Cardiac enlargement. Pulmonary vascularity is normal. Probable emphysematous changes in the lungs with peribronchial  thickening and central interstitial changes consistent with chronic bronchitis. No focal consolidation or airspace disease. No blunting of costophrenic angles. No pneumothorax. Calcified and tortuous aorta. Postoperative changes in the right shoulder and lumbar spine. IMPRESSION: Emphysematous and chronic bronchitic changes in the lungs. No evidence of active pulmonary disease. Electronically Signed   By: Lucienne Capers M.D.   On: 01/20/2017 05:02   Ct Head Wo Contrast  Result Date: 01/20/2017 CLINICAL DATA:  83 y/o F; the status post fall. History of diabetes, stroke, hypertension, dementia, atrial fibrillation, and left carotid endarterectomy. EXAM: CT HEAD WITHOUT CONTRAST CT CERVICAL SPINE WITHOUT CONTRAST TECHNIQUE: Multidetector CT imaging of the head and cervical spine was performed following the standard protocol without intravenous contrast. Multiplanar CT image reconstructions of the cervical spine were also generated. COMPARISON:  09/08/2016 CT of the head. 12/24/2010 MRI of the cervical spine. FINDINGS: CT HEAD FINDINGS Brain: No evidence of acute infarction, hemorrhage, hydrocephalus, extra-axial collection or mass lesion/mass effect. Stable moderate chronic microvascular ischemic changes and parenchymal volume loss of the brain. Chronic right pontomesencephalic junction infarction. Vascular: Extensive calcific atherosclerosis of cavernous and paraclinoid internal carotid arteries. Skull: Stable right temporal bone osteoma. No displaced calvarial fracture. Sinuses/Orbits: Mild anterior ethmoid mucosal thickening. Visualized paranasal sinuses and mastoid air cells are normally aerated. Other: Right ocular banding. Bilateral intra-ocular lens replacement. CT CERVICAL SPINE FINDINGS Alignment: C7-T1 grade 1 anterolisthesis. Skull base  and vertebrae: No acute fracture. No primary bone lesion or focal pathologic process. Soft tissues and spinal canal: No prevertebral fluid or swelling. No visible canal  hematoma. Disc levels: Moderate multilevel disc and facet degenerative changes. No high-grade bony canal stenosis. Multilevel bony foraminal narrowing greatest at the left C3-4 and left C5-6 levels. Upper chest: Negative Other: Extensive calcific atherosclerosis of the right carotid bifurcation. Postsurgical changes related to left carotid endarterectomy. IMPRESSION: 1. No acute intracranial abnormality or displaced calvarial fracture. 2. Stable moderate chronic microvascular ischemic changes and moderate parenchymal volume loss of the brain. 3. Stable chronic lacunar infarct within the right pontomesencephalic junction. 4. No acute fracture or dislocation of the cervical spine. 5. Moderate cervical spondylosis and extensive right carotid bifurcation calcific atherosclerosis. Electronically Signed   By: Kristine Garbe M.D.   On: 01/20/2017 05:12   Ct Cervical Spine Wo Contrast  Result Date: 01/20/2017 CLINICAL DATA:  83 y/o F; the status post fall. History of diabetes, stroke, hypertension, dementia, atrial fibrillation, and left carotid endarterectomy. EXAM: CT HEAD WITHOUT CONTRAST CT CERVICAL SPINE WITHOUT CONTRAST TECHNIQUE: Multidetector CT imaging of the head and cervical spine was performed following the standard protocol without intravenous contrast. Multiplanar CT image reconstructions of the cervical spine were also generated. COMPARISON:  09/08/2016 CT of the head. 12/24/2010 MRI of the cervical spine. FINDINGS: CT HEAD FINDINGS Brain: No evidence of acute infarction, hemorrhage, hydrocephalus, extra-axial collection or mass lesion/mass effect. Stable moderate chronic microvascular ischemic changes and parenchymal volume loss of the brain. Chronic right pontomesencephalic junction infarction. Vascular: Extensive calcific atherosclerosis of cavernous and paraclinoid internal carotid arteries. Skull: Stable right temporal bone osteoma. No displaced calvarial fracture. Sinuses/Orbits: Mild  anterior ethmoid mucosal thickening. Visualized paranasal sinuses and mastoid air cells are normally aerated. Other: Right ocular banding. Bilateral intra-ocular lens replacement. CT CERVICAL SPINE FINDINGS Alignment: C7-T1 grade 1 anterolisthesis. Skull base and vertebrae: No acute fracture. No primary bone lesion or focal pathologic process. Soft tissues and spinal canal: No prevertebral fluid or swelling. No visible canal hematoma. Disc levels: Moderate multilevel disc and facet degenerative changes. No high-grade bony canal stenosis. Multilevel bony foraminal narrowing greatest at the left C3-4 and left C5-6 levels. Upper chest: Negative Other: Extensive calcific atherosclerosis of the right carotid bifurcation. Postsurgical changes related to left carotid endarterectomy. IMPRESSION: 1. No acute intracranial abnormality or displaced calvarial fracture. 2. Stable moderate chronic microvascular ischemic changes and moderate parenchymal volume loss of the brain. 3. Stable chronic lacunar infarct within the right pontomesencephalic junction. 4. No acute fracture or dislocation of the cervical spine. 5. Moderate cervical spondylosis and extensive right carotid bifurcation calcific atherosclerosis. Electronically Signed   By: Kristine Garbe M.D.   On: 01/20/2017 05:12   Dg Hip Unilat W Or Wo Pelvis 2-3 Views Left  Result Date: 01/20/2017 CLINICAL DATA:  Left hip pain after a fall tonight. EXAM: DG HIP (WITH OR WITHOUT PELVIS) 2-3V LEFT COMPARISON:  09/08/2016 FINDINGS: Postoperative left hip hemiarthroplasty. No evidence of acute fracture or dislocation of the left hip. No focal bone lesion or bone destruction. Pelvis and right hip appear intact. SI joints and symphysis pubis are not displaced. Old healed fracture deformities of the right inferior pubic ramus. Prominent degenerative changes in the right hip with remodeling of the superior right femoral head. Postoperative changes in the lower lumbar  spine. Vascular calcifications. IMPRESSION: Previous left hip hemiarthroplasty. No acute fracture or dislocation. Prominent degenerative changes in the right hip. Electronically Signed   By: Gwyndolyn Saxon  Gerilyn Nestle M.D.   On: 01/20/2017 05:04    Procedures Procedures (including critical care time)  Medications Ordered in ED Medications - No data to display   Initial Impression / Assessment and Plan / ED Course  I have reviewed the triage vital signs and the nursing notes.  Pertinent labs & imaging results that were available during my care of the patient were reviewed by me and considered in my medical decision making (see chart for details).      [name]  She was evaluated.  Status post mechanical fall, head and neck CT scans are all within normal parameters.  Chest x-ray and hip x-ray show no fractures.  Patient has been ambulated with the assistance of a walker in the emergency department.  She is sore but able to ambulate.  She's been instructed to continue taking regular doses of Tylenol for discomfort.  Follow-up with her primary care physician as needed.  Final Clinical Impressions(s) / ED Diagnoses   Final diagnoses:  Fall in home, initial encounter  Rib contusion, left, initial encounter  Contusion of left hip, initial encounter    New Prescriptions New Prescriptions   No medications on file     Junius Creamer, NP 01/20/17 2620    Ezequiel Essex, MD 01/20/17 1731

## 2017-01-20 NOTE — ED Notes (Signed)
E-signature not available. Pt states she understands her d/c instructions.

## 2017-01-20 NOTE — ED Notes (Signed)
Pt placed on 2L for dropping into the 80s.[name]

## 2017-01-20 NOTE — ED Notes (Signed)
Pt denies hitting her head. Pt states she landed on her left hip. Pt currently belching/dry heaving, feeling nauseous upon arrival from EMS.

## 2017-01-20 NOTE — ED Notes (Signed)
Pt ambulated from bed to door frame with walker. Pt slow to walk. Pt reports soreness but that she is glad she can walk. This RN explained to pt to expect soreness for a few days.

## 2017-01-20 NOTE — ED Triage Notes (Signed)
Per EMS, pt from home, c/o fall while walking from the bathroom. Pt has multiple pain complaints, left hip, left flank/rib pain, left knee pain, and left ankle swelling. Pt given 100 mcg fentanyl PTA. Upon arrival to this ED, pt diaphoretic with dry heaving. EMS vitals: BP-155/78, HR-49, SpO2-98% room air

## 2017-01-20 NOTE — ED Notes (Signed)
Patient transported to X-ray 

## 2017-01-20 NOTE — ED Provider Notes (Signed)
By signing my name below, I, Dyke Brackett, attest that this documentation has been prepared under the direction and in the presence of Shiva Sahagian, Annie Main, MD . Electronically Signed: Dyke Brackett, Scribe. 01/20/2017. 3:50 AM.  [name] [name] is a 83 y.o. female brought in by ambulance who presents to the Emergency Department complaining of sudden onset, constant, moderate left lateral rib pain s/p mechanical fall tonight PTA. Pt states she was using her walker when she "got tangled up" and fell. She reports associated left lateral hip pain, left hip pain, left ankle swelling, nausea, or vomiting. She was administered 100 mcg Fentanyl en route to the ED with no significant relief of pain, Pt is on Eliquis. No head injury or LOC. She had no vomiting prior to fentanyl administration.  Exam:  No c-spine tenderness. Irregular rhythm. No crepitance to chest wall. No ecchymosis. FROM of left hip without pain. Left rib tenderness.  Abdomen soft.  No ecchymosis to chest or abdomen.  No T or L spine tenderness.  Xrays, CT head.  I personally performed the services described in this documentation, which was scribed in my presence. The recorded information has been reviewed and is accurate.       Ezequiel Essex, MD 01/20/17 1731

## 2017-01-20 NOTE — ED Notes (Signed)
Patient transported to CT 

## 2017-01-21 ENCOUNTER — Telehealth: Payer: Self-pay | Admitting: Neurology

## 2017-01-21 NOTE — Telephone Encounter (Signed)
I called pt's daughter, [name], per DPR. She explains to me that pt recently has had a drastic change in memory and behavior. Pt is "combative" , "argumentative", and has begun "lying" to cover up the memory lapses. [name] thinks that the MMSE will not show the true picture of what is going on, so wanted to let Dr. Brett Fairy know of these issues prior to the appt tomorrow. [name] is worried that the pt will not like her daughter to bring up these issues in the appt. For example, pt's daughter and pt discussed hiring a Engineer, building services, however, another lawn company advertised at the [employer] and pt hired them right away and seemingly forgot that they were already contracted with another [employer]. When pt's daughter approached her about this, the pt became combative and defensive.  [name] just wants me to make Dr. Brett Fairy aware of this prior to the appt tomorrow.

## 2017-01-21 NOTE — Telephone Encounter (Signed)
I have taken notice of the patient's daughter's concerns, I will bring the memory lapses up so she doesn't have to. CD

## 2017-01-21 NOTE — Telephone Encounter (Signed)
Pt daughter wants to make Dr Dohmeier aware of a few things before the appointment: she wants her aware that there has been a drastic change, daughter says she has taken checks , credit cards, from pt. she has become combative. She is also having strong memory issues. Daughter wants to know if meds  may need to be adjusted. Pt daughter would like a call back from Dr Brett Fairy before appointment on [place].

## 2017-01-22 ENCOUNTER — Ambulatory Visit (INDEPENDENT_AMBULATORY_CARE_PROVIDER_SITE_OTHER): Payer: Medicare Other | Admitting: Neurology

## 2017-01-22 ENCOUNTER — Encounter: Payer: Self-pay | Admitting: Neurology

## 2017-01-22 VITALS — BP 136/69 | HR 86

## 2017-01-22 DIAGNOSIS — R441 Visual hallucinations: Secondary | ICD-10-CM

## 2017-01-22 DIAGNOSIS — F0390 Unspecified dementia without behavioral disturbance: Secondary | ICD-10-CM | POA: Diagnosis not present

## 2017-01-22 MED ORDER — QUETIAPINE FUMARATE 25 MG PO TABS: 25.0000 mg | ORAL_TABLET | Freq: Every day | ORAL | 5 refills | Status: DC

## 2017-01-22 MED ORDER — DONEPEZIL HCL 10 MG PO TABS: 10.0000 mg | ORAL_TABLET | Freq: Every day | ORAL | 3 refills | Status: DC

## 2017-01-22 MED ORDER — MEMANTINE HCL 10 MG PO TABS: 10.0000 mg | ORAL_TABLET | Freq: Two times a day (BID) | ORAL | 3 refills | Status: DC

## 2017-01-22 NOTE — Progress Notes (Signed)
[facility]  Provider:  Larey Seat, M D  Referring Provider: Lajean Manes, MD Primary Care Physician:  Lajean Manes, MD     HPI: 01-22-2017,  [name] [name] has been an established patient since 2011 in the [facility] practice. She was initially referred here for mild memory loss and hallucinations.  The hallucinations began after a hip replacement in March 2011, after which she moved to rehabilitation at [facility]. She had experienced a noticeable difference in her handwriting and a left facial droop but a CT scan was negative for stroke. In August 2013 she left [employer],  and then returned home and now again lives alone.  Her Mini-Mental Status Examination was 29 of 30 points or more, was reaching last time  a 30 points AFT was 10 points. Fall risk was 10 points in 2013 .  In December 2013 to return for follow up with another  MMSE., which documented 26/30 , followed by [facility] testing at  16/30 points.  I repeated the [place]  at 13/30 points.  Her fall risk was  increased to 15 points, GDS (geriatric depression score) endorsed at 5 points. The patient underwent another eye surgery on May 8th of this year. She had decreasing visual acuity , her right eye remained red, puffy  and it burned. She has gotten confused when walking to the local bank, has been getting lost on familiar roads and she is not longer permitted to drive.  The 2011  hallucinations were described as " a strong sense that a snake was in the room " ,that there were snakes in her house-  she also has heard a hissing  sounds and rales and feared that the snake was in her room.. She called the police feel that somebody would break into a home,13 times- and as she recalled did not get a warm response. This was likely related to the use of pain medication, inducing hallucinations.  With the beginning of treatment with Seroquel , her  hallucinations resolved. She still reports vivid and at  times crazy dreams.  REM BD.  There is no documented nocturnal activity for the last 12 months, but the patient also lives along and has no witnesses. Her occupational and physical therapists have had visits after she fell Christmas 2017, changed furniture and removed rugs.  She fell on 01-20-2017 after midnight, trying to manage waling through a dark hallway. She couldn't reach the light. She fell and injured her left chest /ribcage, and fell into her walker, it may have gotten cought in furniture. She was recently seen by Dr. Felipa Eth and found the diagnosis of alzheimer's listed, which she had never heard from me. We discuss safety and diagnosis with her daughter present.      [name] [name] is a 83 y.o. female  Is seen here as a routine revisit, interval history from 08-19-2016. [name] has recovered mostly from her shoulder surgery affecting the right dominant shoulder. Incidentally , she presents in a boot , having graduated from a wheelchair last visit, 01-18-2016. with her right leg slightly propped up. She reports that she gets more tired these days and she is just slower but usually she can manage well with a 4 pronged cane. The patient has a known history of memory impairment and today score 26 out of 30 points on the Mini-Mental Status Examination by Lillia Corporal.  She did very well with clock drawing 4/ 4 points, word flow and repeating sentences, numbers and able  to copy to an image without difficulties. She lost vision since her last retinal detachment which may account for some of the lower score. She has dry eyes, followed by [place], OD. She does not longer report hallucinations at night, all visual in the past. Crazy dreams, buy not acting out.  She feels not depressed. She cries sometimes with sad movies, but this is not true affect incontinence/ pseudobulbar .      MMSE - Mini Mental State Exam 01/22/2017 08/19/2016 02/12/2016  Orientation to time 5 4 4   Orientation to Place 5 4 4   Registration 3 3 3    Attention/ Calculation 2 5 1   Recall 1 1 1   Language- name 2 objects 2 2 2   Language- repeat 0 1 0  Language- follow 3 step command 3 3 3   Language- read & follow direction 1 1 1   Write a sentence 1 1 1   Copy design 0 1 0  Total score 23 26 20      CM - visit  11- 2015 .This patient had undergone a shoulder replacement in January 2015 and went again to Rehab at [facility] for a 2 month period, followed by out patient therapy until last week.  [name] [name] [place] has followed her for eye-care, and she reported her right eye remained irritated and itching. Her eyes are very dry. She has used Restasis for several years, but stopped because of the price. She is on Lumigan eye drops still. She is conversant and pleasant. The patient was not longer aware that she had gotten lost several times. The daughter reminded her that these events truly took place. She returned home and in September was seen by her cardiologist for a possible TIA , numbness of the left arm- she presented to urgent care first ( Dr. Michail Sermon is PCP) . In the past she had a transient left facial droop. She had carotid doppler study with Dr. Irish Lack in September, those were less than 70% , Dr Early agreed and  suggested q 6 month follow up , left  Carotid stenosis of 65% and 45 % on the right.  She has completely recovered. Given her chronic a fib on ASA, I would not like to add any anticoagulation- she is at high fall risk.  MOCA today 08-02-14 at only 21 -30 points, not confused, but more delayed in her responses.    Review of Systems: Out of a complete 14 system review, the patient complains of only the following symptoms, and all other reviewed systems are negative.    Vision loss, fall risk high, does not walk today , but is seated in a wheelchair.   Memory loss, high degree of fatigue, vision is limited, burning pain in the right eye,  had retinal detachment. She has bowel and urinary incontinence for 30 month . No recent  hallucinations. On anticoagulation.   Current Outpatient Prescriptions  Medication Sig Dispense Refill  . acetaminophen (TYLENOL) 650 MG CR tablet Take 650 mg by mouth every 8 (eight) hours as needed for pain.    [name] apixaban (ELIQUIS) 5 MG TABS tablet Take 1 tablet (5 mg total) by mouth 2 (two) times daily. 60 tablet 3  . Ascorbic Acid (VITAMIN C) 1000 MG tablet Take 1,000 mg by mouth 3 (three) times a week.     . cholecalciferol (VITAMIN D) 1000 UNITS tablet Take 1,000 Units by mouth See admin instructions. On Monday, Tuesday, Wednesday, Friday, Saturday.    . COMBIGAN 0.2-0.5 % ophthalmic solution Place  1-2 drops into both eyes daily.   0  . donepezil (ARICEPT) 10 MG tablet Take 1 tablet (10 mg total) by mouth at bedtime. (Patient taking differently: Take 5 mg by mouth at bedtime. ) 90 tablet 3  . ergocalciferol (VITAMIN D2) 50000 UNITS capsule Take 50,000 Units by mouth 2 (two) times a week. On Thursday and Sunday    . felodipine (PLENDIL) 10 MG 24 hr tablet Take 10 mg by mouth daily.    [name] FLUoxetine (PROZAC) 20 MG capsule Take 20 mg by mouth every morning.     [name] glipiZIDE (GLUCOTROL XL) 5 MG 24 hr tablet Take 5 mg by mouth at bedtime.     . Insulin Glargine (LANTUS SOLOSTAR) 100 UNIT/ML SOPN Inject 10 Units into the skin at bedtime.     [name] JANUVIA 100 MG tablet Take 100 mg by mouth every morning.     [name] levothyroxine (SYNTHROID, LEVOTHROID) 125 MCG tablet Take 125 mcg by mouth daily before breakfast.     . losartan (COZAAR) 25 MG tablet Take 25 mg by mouth daily.   0  . memantine (NAMENDA) 10 MG tablet Take 1 tablet (10 mg total) by mouth 2 (two) times daily. 180 tablet 3  . metoprolol tartrate (LOPRESSOR) 25 MG tablet Take 25 mg by mouth 2 (two) times daily.      . mirabegron ER (MYRBETRIQ) 50 MG TB24 Take 50 mg by mouth every evening.    . pioglitazone (ACTOS) 30 MG tablet Take 30 mg by mouth every morning.     . pravastatin (PRAVACHOL) 80 MG tablet take 1 tablet by mouth once daily 90  tablet 3  . prednisoLONE sodium phosphate (INFLAMASE FORTE) 1 % ophthalmic solution Place 1 drop into the right eye 3 (three) times daily.    . QUEtiapine (SEROQUEL) 25 MG tablet Take 25 mg by mouth at bedtime.    . silver sulfADIAZINE (SILVADENE) 1 % cream Apply 1 application topically daily. 50 g 0   No current facility-administered medications for this visit.     Allergies as of 01/22/2017 - Review Complete 01/22/2017  Allergen Reaction Noted  . Amitriptyline Other (See Comments) 09/27/2013  . Vesicare [solifenacin] Other (See Comments) 09/27/2013  . Vicodin [hydrocodone-acetaminophen] Other (See Comments) 05/09/2011  . Polysaccharide iron complex Hives, Itching, and Rash 05/12/2011    Vitals: BP 136/69   Pulse 86  Last Weight:  Wt Readings from Last 1 Encounters:  01/20/17 187 lb (84.8 kg)   Last Height:   Ht Readings from Last 1 Encounters:  01/20/17 5' (1.524 m)    Physical exam:  General: The patient is awake, alert and appears not in acute distress. The patient is well groomed. Head: Normocephalic, atraumatic. Neck is supple.  Cardiovascular:  irregular rate and rhythm without carotid bruit, and without distended neck veins. Respiratory: Lungs are clear to auscultation. Skin:  Without evidence of edema, or rash Trunk: BMI is elevated . The  patient has fallen in June 2014, christmas 2017 and last week, 01-10-2017   Neurologic exam : The patient is awake and alert, oriented to place and time.  Memory subjective described as declining , but not rapidly.   There is no longer a normal attention span & concentration ability.  Speech is fluent without dysarthria, mild dysphonia  ( hoarseness ) and no  aphasia. Mood and affect are appropriate. She is happy.  Cranial nerves: Pupils are equal and briskly reactive to light. Her right eye is reddish swollen. Extraocular movements  in vertical and horizontal planes intact and without nystagmus. Hearing to finger rub intact.  Facial sensation intact to fine touch.  Facial motor strength is symmetric - there is still a hint of left facial droop . Tongue and uvula move midline. Motor exam:   normal strength in upper extremities. Good grip strength. Fine motor skills preserved.  Gait and station: Patient in wheelchair.  Deep tendon reflexes: in the  upper and lower extremities are symmetric and intact.  Assessment:   1) Advancing memory los, inability to multitask, delay in motor responses and poor vision. The patient is not allowed to drive any longer. Fell at night, denies that this was related to vivid dreams, but stumbled over a rug- Early stage dementia MMSE 26-30 , MOCA 23-30 , not longer mild cognitive impairment.   Lewy body or frontal lobe dementia were suspected, but she no longer has hallucinations of auditory character on Seroquel medication.   Discussed and repeated driving restriction - she should not even drive shopping or in her residential area.  Wheelchair prevents falls outdoors and longer distance, but she needs to get back on her feet with walker.  Fall risk assessment of home suggested, PT will be re-ordered.    Loralee Weitzman, MD

## 2017-02-03 ENCOUNTER — Ambulatory Visit (INDEPENDENT_AMBULATORY_CARE_PROVIDER_SITE_OTHER): Payer: Medicare Other | Admitting: Podiatry

## 2017-02-03 ENCOUNTER — Encounter: Payer: Self-pay | Admitting: Podiatry

## 2017-02-03 ENCOUNTER — Telehealth: Payer: Self-pay | Admitting: *Deleted

## 2017-02-03 DIAGNOSIS — L97521 Non-pressure chronic ulcer of other part of left foot limited to breakdown of skin: Secondary | ICD-10-CM | POA: Diagnosis not present

## 2017-02-03 DIAGNOSIS — E1151 Type 2 diabetes mellitus with diabetic peripheral angiopathy without gangrene: Secondary | ICD-10-CM

## 2017-02-03 NOTE — Progress Notes (Signed)
Patient ID: [name] [name], female   DOB: [birth year], 83 y.o.   MRN: [id number]    Subjective: Patient presents for follow-up care for ongoing chronic diabetic skin ulcers left foot, with initial symptoms on January 2016. Patient has periodic surveillance with vascular surgeon. Patient is a diabetic with known peripheral arterial disease, as well as vascular surgery left leg in 2013 Patient has a history of pelvic fracture Patient's friend [name] [name] is present in the treatment room    Objective: Pleasant orientated 3 Patient is not wearing surgical shoe on left footand no gauze dressings over the wounds on the left foot  Vascular: No calf edema or calf tenderness bilaterally DP and PT pulses 0/4 bilaterally Capillary reflex delayed bilaterally  Neurological: Sensation to 10 g monofilament wire intact 2/5 bilaterally Vibratory sensation reactive bilaterally Ankle reflexes reactive bilaterally  Dermatological: Atrophic skin bilaterally 4 mm superficial ulceration medial left hallux without any surrounding erythema or edema 4 mm superficial ulcer lateral border left foot without any surrounding erythema or edema Closed ulcer fourth left toe Close ulcer second left toe  Assessment: Diabetic peripheral arterial disease Diabetic peripheral neuropathy Chronic noninfected skin ulcerations 2 left foot  Plan: Debrided ulcer sites 2 and apply Silvadene dressings Again and encouraged patient to wear surgical shoe on the left foot Patient instructed apply Silvadene to skin ulcers daily and cover with gauze We'll contact the wound care center for follow-up for chronic skin ulcers on the left foot and in the meantime pending transfer to wound care reschedule patient for follow-up care Wounds are chronic, however stable for long periods of time  Reappoint 6 weeks

## 2017-02-03 NOTE — Telephone Encounter (Signed)
-----   Message from Tonye Pearson, RMA sent at 02/03/2017 11:53 AM EDT ----- Regarding: Wound Care Per Dr Amalia Hailey, patient is to be scheduled for appointment with wound care for: left foot, multiple ulcers

## 2017-02-03 NOTE — Patient Instructions (Signed)
Apply Silvadene to the skin ulcer on the left great toe and side of the left foot daily and cover with Band-Aids or gauze If you develop any sudden increase in pain, swelling, redness, fever present to the emergency department Will contact the wound care center to arrange a follow-up visit. Until that time we will reschedule you for a follow-up 6 weeks  Diabetes and Foot Care Diabetes may cause you to have problems because of poor blood supply (circulation) to your feet and legs. This may cause the skin on your feet to become thinner, break easier, and heal more slowly. Your skin may become dry, and the skin may peel and crack. You may also have nerve damage in your legs and feet causing decreased feeling in them. You may not notice minor injuries to your feet that could lead to infections or more serious problems. Taking care of your feet is one of the most important things you can do for yourself. Follow these instructions at home:  Wear shoes at all times, even in the house. Do not go barefoot. Bare feet are easily injured.  Check your feet daily for blisters, cuts, and redness. If you cannot see the bottom of your feet, use a mirror or ask someone for help.  Wash your feet with warm water (do not use hot water) and mild soap. Then pat your feet and the areas between your toes until they are completely dry. Do not soak your feet as this can dry your skin.  Apply a moisturizing lotion or petroleum jelly (that does not contain alcohol and is unscented) to the skin on your feet and to dry, brittle toenails. Do not apply lotion between your toes.  Trim your toenails straight across. Do not dig under them or around the cuticle. File the edges of your nails with an emery board or nail file.  Do not cut corns or calluses or try to remove them with medicine.  Wear clean socks or stockings every day. Make sure they are not too tight. Do not wear knee-high stockings since they may decrease blood flow  to your legs.  Wear shoes that fit properly and have enough cushioning. To break in new shoes, wear them for just a few hours a day. This prevents you from injuring your feet. Always look in your shoes before you put them on to be sure there are no objects inside.  Do not cross your legs. This may decrease the blood flow to your feet.  If you find a minor scrape, cut, or break in the skin on your feet, keep it and the skin around it clean and dry. These areas may be cleansed with mild soap and water. Do not cleanse the area with peroxide, alcohol, or iodine.  When you remove an adhesive bandage, be sure not to damage the skin around it.  If you have a wound, look at it several times a day to make sure it is healing.  Do not use heating pads or hot water bottles. They may burn your skin. If you have lost feeling in your feet or legs, you may not know it is happening until it is too late.  Make sure your health care provider performs a complete foot exam at least annually or more often if you have foot problems. Report any cuts, sores, or bruises to your health care provider immediately. Contact a health care provider if:  You have an injury that is not healing.  You have cuts  or breaks in the skin.  You have an ingrown nail.  You notice redness on your legs or feet.  You feel burning or tingling in your legs or feet.  You have pain or cramps in your legs and feet.  Your legs or feet are numb.  Your feet always feel cold. Get help right away if:  There is increasing redness, swelling, or pain in or around a wound.  There is a red line that goes up your leg.  Pus is coming from a wound.  You develop a fever or as directed by your health care provider.  You notice a bad smell coming from an ulcer or wound. This information is not intended to replace advice given to you by your health care provider. Make sure you discuss any questions you have with your health care  provider. Document Released: 08/29/2000 Document Revised: 02/07/2016 Document Reviewed: 02/08/2013 Elsevier Interactive Patient Education  2017 [employer].

## 2017-02-03 NOTE — Telephone Encounter (Signed)
Faxed required form, clinicals and demographics to [facility].

## 2017-02-10 ENCOUNTER — Telehealth: Payer: Self-pay | Admitting: Neurology

## 2017-02-10 DIAGNOSIS — F0280 Dementia in other diseases classified elsewhere without behavioral disturbance: Secondary | ICD-10-CM

## 2017-02-10 NOTE — Telephone Encounter (Signed)
Patients daughter [name] (listed on DPR) called office in reference to PT referral.  [name] would like patient to be able to have PT at home due to not driving.  Please call

## 2017-02-10 NOTE — Addendum Note (Signed)
Addended by: Larey Seat on: 02/10/2017 04:22 PM   Modules accepted: Orders

## 2017-02-11 ENCOUNTER — Ambulatory Visit: Payer: Medicare Other

## 2017-02-13 NOTE — Telephone Encounter (Signed)
Referral sent to [facility]. Thanks Hinton Dyer.

## 2017-02-13 NOTE — Addendum Note (Signed)
Addendum  created 02/13/17 1238 by Rica Koyanagi, MD   Sign clinical note

## 2017-02-17 ENCOUNTER — Ambulatory Visit: Payer: Medicare Other | Admitting: Neurology

## 2017-02-18 ENCOUNTER — Telehealth: Payer: Self-pay | Admitting: Neurology

## 2017-02-18 NOTE — Telephone Encounter (Signed)
Home health referral placed by Dr. Brett Fairy on 02/10/17 d/t frequent falls. Returned TC to [facility] agency for approval of PT visits twice a week for 4 wks.

## 2017-02-18 NOTE — Telephone Encounter (Signed)
[facility] [phone] request VO for PT 2 x 4. Please call

## 2017-02-18 NOTE — Telephone Encounter (Signed)
Anderson Malta  is this a FYI ?

## 2017-02-20 ENCOUNTER — Encounter (HOSPITAL_BASED_OUTPATIENT_CLINIC_OR_DEPARTMENT_OTHER): Payer: Medicare Other | Attending: Internal Medicine

## 2017-02-20 DIAGNOSIS — F0390 Unspecified dementia without behavioral disturbance: Secondary | ICD-10-CM | POA: Insufficient documentation

## 2017-02-20 DIAGNOSIS — E11621 Type 2 diabetes mellitus with foot ulcer: Secondary | ICD-10-CM | POA: Diagnosis present

## 2017-02-20 DIAGNOSIS — E1142 Type 2 diabetes mellitus with diabetic polyneuropathy: Secondary | ICD-10-CM | POA: Insufficient documentation

## 2017-02-20 DIAGNOSIS — I10 Essential (primary) hypertension: Secondary | ICD-10-CM | POA: Insufficient documentation

## 2017-02-20 DIAGNOSIS — L97522 Non-pressure chronic ulcer of other part of left foot with fat layer exposed: Secondary | ICD-10-CM | POA: Diagnosis not present

## 2017-02-20 DIAGNOSIS — I2510 Atherosclerotic heart disease of native coronary artery without angina pectoris: Secondary | ICD-10-CM | POA: Insufficient documentation

## 2017-02-20 DIAGNOSIS — E1151 Type 2 diabetes mellitus with diabetic peripheral angiopathy without gangrene: Secondary | ICD-10-CM | POA: Diagnosis not present

## 2017-02-24 ENCOUNTER — Ambulatory Visit: Payer: Medicare Other | Admitting: Neurology

## 2017-02-26 NOTE — Addendum Note (Signed)
Addended by: Lianne Cure A on: 02/26/2017 10:34 AM   Modules accepted: Orders

## 2017-03-04 ENCOUNTER — Other Ambulatory Visit: Payer: Self-pay | Admitting: Interventional Cardiology

## 2017-03-05 ENCOUNTER — Other Ambulatory Visit: Payer: Self-pay | Admitting: *Deleted

## 2017-03-05 DIAGNOSIS — E11621 Type 2 diabetes mellitus with foot ulcer: Secondary | ICD-10-CM | POA: Diagnosis not present

## 2017-03-05 MED ORDER — APIXABAN 5 MG PO TABS: 5.0000 mg | ORAL_TABLET | Freq: Two times a day (BID) | ORAL | 6 refills | Status: DC

## 2017-03-05 NOTE — Telephone Encounter (Signed)
Pt last saw Dr Irish Lack on 08/01/16, last labs 12/01/16 Creat 1.27, weight 84.8, age 83, based on specified criteria pt is on appropriate dosage of Eliquis 5mg  BID.  Will refill rx.

## 2017-03-19 ENCOUNTER — Encounter (HOSPITAL_BASED_OUTPATIENT_CLINIC_OR_DEPARTMENT_OTHER): Payer: Medicare Other | Attending: Internal Medicine

## 2017-03-19 ENCOUNTER — Ambulatory Visit: Payer: Medicare Other | Admitting: Neurology

## 2017-03-19 DIAGNOSIS — E11621 Type 2 diabetes mellitus with foot ulcer: Secondary | ICD-10-CM | POA: Insufficient documentation

## 2017-03-19 DIAGNOSIS — I10 Essential (primary) hypertension: Secondary | ICD-10-CM | POA: Insufficient documentation

## 2017-03-19 DIAGNOSIS — E1151 Type 2 diabetes mellitus with diabetic peripheral angiopathy without gangrene: Secondary | ICD-10-CM | POA: Insufficient documentation

## 2017-03-19 DIAGNOSIS — I2510 Atherosclerotic heart disease of native coronary artery without angina pectoris: Secondary | ICD-10-CM | POA: Insufficient documentation

## 2017-03-19 DIAGNOSIS — E1142 Type 2 diabetes mellitus with diabetic polyneuropathy: Secondary | ICD-10-CM | POA: Insufficient documentation

## 2017-03-19 DIAGNOSIS — F0390 Unspecified dementia without behavioral disturbance: Secondary | ICD-10-CM | POA: Insufficient documentation

## 2017-03-19 DIAGNOSIS — L97522 Non-pressure chronic ulcer of other part of left foot with fat layer exposed: Secondary | ICD-10-CM | POA: Insufficient documentation

## 2017-03-20 ENCOUNTER — Encounter: Payer: Self-pay | Admitting: Vascular Surgery

## 2017-03-23 ENCOUNTER — Telehealth: Payer: Self-pay | Admitting: Neurology

## 2017-03-23 NOTE — Telephone Encounter (Signed)
Patient's daughter calling. Patient is forgetting things and daughter wonders if her medications need to be tweaked. She is also lashing out more than usual. Please call and discuss.

## 2017-03-24 NOTE — Telephone Encounter (Addendum)
Spoke to patient's dgt on HIPAA, [employer] [phone]).  She is concerned about the following progressing symptoms she has noticed in her mother:  1) increased forgetfulness during the day 2) restlessness during the night along with vivid dreams 3) more argumentative and irrational 4) visual hallucinations (see bugs coming out of walls/on her pillow - thinking two men broke into her home)  Her mother lives alone but the daughter says she is only 8 minutes away.  She visits her home frequently and speaks with her on the phone daily.  She has continued taking Aricpet, Namenda and Seroquel as prescribed.  Her daughter would like to know if any adjustments can be made to her medication regimen to help alleviate some of her symptoms.

## 2017-03-24 NOTE — Telephone Encounter (Signed)
Left message for a return call

## 2017-03-24 NOTE — Telephone Encounter (Addendum)
Per vo by Dr. Brett Fairy, she can try Seroquel 50mg , qhs to help with hallucinations.  However, she would like for this increased dosage to be tried when a family member is available to stay with her.  This is to be sure she is able to tolerate the medication without adverse side effects.  I returned the call to her dgt, [employer], who was agreeable to this plan.  States she would like to try the higher dose with the medication she already has at home to make sure it will be helpful.  She will call back to let us know if the higher dose works and a new rx can be sent at that time.

## 2017-03-25 ENCOUNTER — Ambulatory Visit: Payer: Medicare Other | Admitting: Podiatry

## 2017-03-30 DIAGNOSIS — I10 Essential (primary) hypertension: Secondary | ICD-10-CM | POA: Diagnosis not present

## 2017-03-30 DIAGNOSIS — F0390 Unspecified dementia without behavioral disturbance: Secondary | ICD-10-CM | POA: Diagnosis not present

## 2017-03-30 DIAGNOSIS — E11621 Type 2 diabetes mellitus with foot ulcer: Secondary | ICD-10-CM | POA: Diagnosis present

## 2017-03-30 DIAGNOSIS — L97522 Non-pressure chronic ulcer of other part of left foot with fat layer exposed: Secondary | ICD-10-CM | POA: Diagnosis not present

## 2017-03-30 DIAGNOSIS — E1142 Type 2 diabetes mellitus with diabetic polyneuropathy: Secondary | ICD-10-CM | POA: Diagnosis not present

## 2017-03-30 DIAGNOSIS — I2510 Atherosclerotic heart disease of native coronary artery without angina pectoris: Secondary | ICD-10-CM | POA: Diagnosis not present

## 2017-03-30 DIAGNOSIS — E1151 Type 2 diabetes mellitus with diabetic peripheral angiopathy without gangrene: Secondary | ICD-10-CM | POA: Diagnosis not present

## 2017-03-31 ENCOUNTER — Encounter: Payer: Self-pay | Admitting: Vascular Surgery

## 2017-03-31 ENCOUNTER — Ambulatory Visit (INDEPENDENT_AMBULATORY_CARE_PROVIDER_SITE_OTHER): Payer: Medicare Other | Admitting: Vascular Surgery

## 2017-03-31 ENCOUNTER — Other Ambulatory Visit: Payer: Self-pay

## 2017-03-31 VITALS — BP 133/69 | HR 67 | Temp 98.2°F | Resp 16 | Ht 60.0 in | Wt 175.0 lb

## 2017-03-31 DIAGNOSIS — I739 Peripheral vascular disease, unspecified: Secondary | ICD-10-CM

## 2017-03-31 DIAGNOSIS — I998 Other disorder of circulatory system: Secondary | ICD-10-CM

## 2017-03-31 DIAGNOSIS — I70229 Atherosclerosis of native arteries of extremities with rest pain, unspecified extremity: Secondary | ICD-10-CM

## 2017-03-31 NOTE — Progress Notes (Signed)
[facility] [place]  Patient name: [name] [name] MRN: [id number] DOB: 1934/03/17 Sex: female  REASON FOR VISIT: Evaluation of lower external ureter insufficiency and nonhealing ulceration left lateral foot  HPI: [name] [name] is a 83 y.o. female here today for evaluation of nonhealing wound in her left lateral foot. She is well-known to me from prior evaluation of peripheral vascular disease and carotid disease. She had undergone a formal arteriogram 2016. That time she was found to have occlusion of her superficial femoral artery with reconstitution of below-knee popliteal artery. Is felt that conservative treatment was warranted at that time. She now reports progressive severe intolerable pain in her left foot and ulceration over the left lateral foot and also over excoriation in her great toe. She does have dementia but this is stable and is actually quite remarkable in her memory on most things. She is here to get today with her long-term caregiver.  Past Medical History:  Diagnosis Date  . Anemia   . Anxiety   . Atrial fibrillation ([place])   . Bilateral carotid artery stenosis   . Bowel incontinence   . CAD (coronary artery disease)    a. s/p CABG 2012.  [name] Chronic lower back pain    "back is very stiff because of fusion; can't stand up straight more than few moments"  . Closed fracture of unspecified part of neck of femur   . Complication of anesthesia    anesth. meds caused hallucinations   . Cough    no fever, chronic  . Degenerative joint disease   . Dementia   . Depression   . Depressive disorder, not elsewhere classified   . Diverticulitis   . Endometriosis   . Fall at home 05/15/2013  . Female stress incontinence   . GERD (gastroesophageal reflux disease)   . Headache   . Hiatal hernia   . History of blood transfusion   . History of bronchitis   . History of wheezing   . HLD (hyperlipidemia)   . Hx of  cardiovascular stress test    Lexiscan Myoview (11/15):  Low risk stress nuclear study with a small, mild fixed apical septal perfusion defect. EF is 52% with mild septal hypokinesis. No ischemia.  [name] Hx of echocardiogram    Echo (11/15):  Mod LVH, severe septal hypertrophy (suggestive HCM), no LVOT obstruction, rest gradient 15 mmHg across AV, vigorous LVF, EF 65-70%, no RWMA, Gr 1 DD, Ao sclerosis (no stenosis), mod LAE, mild RAE, mod TR, PASP 42 mmHg  . Hypertension   . Hypertonicity of bladder   . Hypertrophic cardiomyopathy ([place])   . Hypothyroidism   . Lumbago   . Muscle weakness (generalized)   . Obesity   . Osteoarthrosis, unspecified whether generalized or localized, unspecified site   . Progressive dementia with uncertain etiology 9/76/7341   Suspect Lewy body .  [name] Reflux esophagitis   . Stroke [facility]) 2011   "old very small TIA/neurologist"  . Type II diabetes mellitus ([place])   . Ulcer    left foot x 2 years sees dr Little Ishikawa podiastrist for wound staying same tiny wart appearing areas no drainage changes dressing every 3 days  . Unspecified glaucoma(365.9)     Family History  Problem Relation Age of Onset  . Cancer Cousin   . Heart attack Mother   . Hypertension Mother   . Heart attack Father   . Heart disease Father  Before age 40  . Hyperlipidemia Father   . Hypertension Father   . Cancer Paternal Aunt     SOCIAL HISTORY: Social History  Substance Use Topics  . Smoking status: Former Smoker    Packs/day: 1.50    Types: Cigarettes    Quit date: 09/16/1987  . Smokeless tobacco: Never Used  . Alcohol use No    Allergies  Allergen Reactions  . Amitriptyline Other (See Comments)    hallucinations  . Vesicare [Solifenacin] Other (See Comments)    hallucinations  . Vicodin [Hydrocodone-Acetaminophen] Other (See Comments)    Hallucinations - do NOT verify Vicodin orders  . Polysaccharide Iron Complex Hives, Itching and Rash    Patient can tolerate Ferrous  Sulfate    Current Outpatient Prescriptions  Medication Sig Dispense Refill  . acetaminophen (TYLENOL) 650 MG CR tablet Take 650 mg by mouth every 8 (eight) hours as needed for pain.    [name] apixaban (ELIQUIS) 5 MG TABS tablet Take 1 tablet (5 mg total) by mouth 2 (two) times daily. 60 tablet 6  . Ascorbic Acid (VITAMIN C) 1000 MG tablet Take 1,000 mg by mouth 3 (three) times a week.     . cholecalciferol (VITAMIN D) 1000 UNITS tablet Take 1,000 Units by mouth See admin instructions. On Monday, Tuesday, Wednesday, Friday, Saturday.    . COMBIGAN 0.2-0.5 % ophthalmic solution Place 1-2 drops into both eyes daily.   0  . donepezil (ARICEPT) 10 MG tablet Take 1 tablet (10 mg total) by mouth at bedtime. 90 tablet 3  . ergocalciferol (VITAMIN D2) 50000 UNITS capsule Take 50,000 Units by mouth 2 (two) times a week. On Thursday and Sunday    . felodipine (PLENDIL) 10 MG 24 hr tablet Take 10 mg by mouth daily.    [name] FLUoxetine (PROZAC) 20 MG capsule Take 20 mg by mouth every morning.     [name] glipiZIDE (GLUCOTROL XL) 5 MG 24 hr tablet Take 5 mg by mouth at bedtime.     . Insulin Glargine (LANTUS SOLOSTAR) 100 UNIT/ML SOPN Inject 10 Units into the skin at bedtime.     [name] JANUVIA 100 MG tablet Take 100 mg by mouth every morning.     [name] levothyroxine (SYNTHROID, LEVOTHROID) 125 MCG tablet Take 125 mcg by mouth daily before breakfast.     . losartan (COZAAR) 25 MG tablet Take 25 mg by mouth daily.   0  . memantine (NAMENDA) 10 MG tablet Take 1 tablet (10 mg total) by mouth 2 (two) times daily. 180 tablet 3  . metoprolol tartrate (LOPRESSOR) 25 MG tablet Take 25 mg by mouth 2 (two) times daily.      . mirabegron ER (MYRBETRIQ) 50 MG TB24 Take 50 mg by mouth every evening.    . pioglitazone (ACTOS) 30 MG tablet Take 30 mg by mouth every morning.     . pravastatin (PRAVACHOL) 80 MG tablet take 1 tablet by mouth once daily 90 tablet 3  . prednisoLONE sodium phosphate (INFLAMASE FORTE) 1 % ophthalmic solution Place 1  drop into the right eye 3 (three) times daily.    . QUEtiapine (SEROQUEL) 25 MG tablet Take 1 tablet (25 mg total) by mouth at bedtime. 30 tablet 5  . silver sulfADIAZINE (SILVADENE) 1 % cream Apply 1 application topically daily. 50 g 0  . XIIDRA 5 % SOLN   0   No current facility-administered medications for this visit.     REVIEW OF SYSTEMS:  [X]  denotes positive finding, [ ]   denotes negative finding Cardiac  Comments:  Chest pain or chest pressure:    Shortness of breath upon exertion:    Short of breath when lying flat:    Irregular heart rhythm:        Vascular    Pain in calf, thigh, or hip brought on by ambulation:    Pain in feet at night that wakes you up from your sleep:  x   Blood clot in your veins:    Leg swelling:           PHYSICAL EXAM: Vitals:   03/31/17 1118  BP: 133/69  Pulse: 67  Resp: 16  Temp: 98.2 F (36.8 C)  SpO2: 97%  Weight: 175 lb (79.4 kg)  Height: 5' (1.524 m)    GENERAL: The patient is a well-nourished female, in no acute distress. The vital signs are documented above. CARDIOVASCULAR: Palpable radial and palpable femoral pulses. Absent left popliteal and distal pulses. PULMONARY: There is good air exchange  MUSCULOSKELETAL: There are no major deformities or cyanosis. NEUROLOGIC: No focal weakness or paresthesias are detected. SKIN: 1 cm ulceration of her lateral left foot with irregular edges. PSYCHIATRIC: The patient has a normal affect.  DATA:  [age]-year-old noninvasive lower extremity study and her arteriogram. Reveals left superficial femoral artery occlusion  MEDICAL ISSUES: Difficult problem. Patient has progressive tissue loss despite local wound care. She also is having severe intolerable rest pain. Feel her only option would be arteriography for further evaluation and probable plan for bypass. She apparently has had vein harvest from both legs from her knees proximally with the appearance of the endoscopic vein harvest incision  in her proximal calves bilaterally. Will proceed first with arteriography to determine what options she has available for revascularization    Rosetta Posner, MD [facility] Tel [phone] Pager [phone]

## 2017-04-01 ENCOUNTER — Telehealth: Payer: Self-pay

## 2017-04-01 ENCOUNTER — Telehealth: Payer: Self-pay | Admitting: Pharmacist

## 2017-04-01 NOTE — Telephone Encounter (Signed)
Received fax from [facility], Dr. Donzetta Matters for clearance for Eliquis. Pt on Eliquis for Afib with history of stroke. Per protocol ok to hold 24 hour prior to procedure and resume ASAP given history of stroke. Recommendations faxed back to number provided.

## 2017-04-01 NOTE — Telephone Encounter (Signed)
Rec'd recommendation per Tana Coast, Pharm D re: holding Eliquis 24 hrs. prior to procedure, instead of 48 hrs.  Notified pt's. Caregiver, Barbera Setters of the change in direction on Eliquis.  Instructed pt. To hold on 7/24, and AM of 7/25, prior to procedure.  Verb. Understanding.

## 2017-04-06 DIAGNOSIS — E11621 Type 2 diabetes mellitus with foot ulcer: Secondary | ICD-10-CM | POA: Diagnosis not present

## 2017-04-08 ENCOUNTER — Observation Stay (HOSPITAL_COMMUNITY)
Admission: RE | Admit: 2017-04-08 | Discharge: 2017-04-09 | Disposition: A | Discharge status: Home / Self Care | Payer: Medicare Other | Source: Ambulatory Visit | Attending: Vascular Surgery | Admitting: Vascular Surgery

## 2017-04-08 ENCOUNTER — Ambulatory Visit: Payer: Medicare Other | Admitting: Podiatry

## 2017-04-08 ENCOUNTER — Encounter (HOSPITAL_COMMUNITY): Payer: Self-pay | Admitting: General Practice

## 2017-04-08 ENCOUNTER — Encounter (HOSPITAL_COMMUNITY)
Admission: RE | Disposition: A | Discharge status: Home / Self Care | Payer: Self-pay | Source: Ambulatory Visit | Attending: Vascular Surgery

## 2017-04-08 DIAGNOSIS — L97529 Non-pressure chronic ulcer of other part of left foot with unspecified severity: Secondary | ICD-10-CM | POA: Diagnosis not present

## 2017-04-08 DIAGNOSIS — Z6835 Body mass index (BMI) 35.0-35.9, adult: Secondary | ICD-10-CM | POA: Insufficient documentation

## 2017-04-08 DIAGNOSIS — E11319 Type 2 diabetes mellitus with unspecified diabetic retinopathy without macular edema: Secondary | ICD-10-CM | POA: Insufficient documentation

## 2017-04-08 DIAGNOSIS — G8929 Other chronic pain: Secondary | ICD-10-CM | POA: Insufficient documentation

## 2017-04-08 DIAGNOSIS — G301 Alzheimer's disease with late onset: Secondary | ICD-10-CM | POA: Diagnosis not present

## 2017-04-08 DIAGNOSIS — I70245 Atherosclerosis of native arteries of left leg with ulceration of other part of foot: Secondary | ICD-10-CM | POA: Insufficient documentation

## 2017-04-08 DIAGNOSIS — K219 Gastro-esophageal reflux disease without esophagitis: Secondary | ICD-10-CM | POA: Diagnosis not present

## 2017-04-08 DIAGNOSIS — E039 Hypothyroidism, unspecified: Secondary | ICD-10-CM | POA: Insufficient documentation

## 2017-04-08 DIAGNOSIS — M1990 Unspecified osteoarthritis, unspecified site: Secondary | ICD-10-CM | POA: Insufficient documentation

## 2017-04-08 DIAGNOSIS — I2510 Atherosclerotic heart disease of native coronary artery without angina pectoris: Secondary | ICD-10-CM | POA: Insufficient documentation

## 2017-04-08 DIAGNOSIS — H33001 Unspecified retinal detachment with retinal break, right eye: Secondary | ICD-10-CM | POA: Insufficient documentation

## 2017-04-08 DIAGNOSIS — F0281 Dementia in other diseases classified elsewhere with behavioral disturbance: Secondary | ICD-10-CM | POA: Diagnosis not present

## 2017-04-08 DIAGNOSIS — E1151 Type 2 diabetes mellitus with diabetic peripheral angiopathy without gangrene: Principal | ICD-10-CM | POA: Insufficient documentation

## 2017-04-08 DIAGNOSIS — Z794 Long term (current) use of insulin: Secondary | ICD-10-CM | POA: Insufficient documentation

## 2017-04-08 DIAGNOSIS — I482 Chronic atrial fibrillation: Secondary | ICD-10-CM | POA: Insufficient documentation

## 2017-04-08 DIAGNOSIS — E1143 Type 2 diabetes mellitus with diabetic autonomic (poly)neuropathy: Secondary | ICD-10-CM | POA: Diagnosis not present

## 2017-04-08 DIAGNOSIS — F329 Major depressive disorder, single episode, unspecified: Secondary | ICD-10-CM | POA: Diagnosis not present

## 2017-04-08 DIAGNOSIS — F419 Anxiety disorder, unspecified: Secondary | ICD-10-CM | POA: Diagnosis not present

## 2017-04-08 DIAGNOSIS — Z951 Presence of aortocoronary bypass graft: Secondary | ICD-10-CM | POA: Insufficient documentation

## 2017-04-08 DIAGNOSIS — I97638 Postprocedural hematoma of a circulatory system organ or structure following other circulatory system procedure: Secondary | ICD-10-CM | POA: Diagnosis not present

## 2017-04-08 DIAGNOSIS — Y838 Other surgical procedures as the cause of abnormal reaction of the patient, or of later complication, without mention of misadventure at the time of the procedure: Secondary | ICD-10-CM | POA: Insufficient documentation

## 2017-04-08 DIAGNOSIS — E669 Obesity, unspecified: Secondary | ICD-10-CM | POA: Insufficient documentation

## 2017-04-08 DIAGNOSIS — E785 Hyperlipidemia, unspecified: Secondary | ICD-10-CM | POA: Diagnosis not present

## 2017-04-08 DIAGNOSIS — Z7901 Long term (current) use of anticoagulants: Secondary | ICD-10-CM | POA: Insufficient documentation

## 2017-04-08 DIAGNOSIS — K5900 Constipation, unspecified: Secondary | ICD-10-CM | POA: Diagnosis not present

## 2017-04-08 DIAGNOSIS — K3184 Gastroparesis: Secondary | ICD-10-CM | POA: Insufficient documentation

## 2017-04-08 DIAGNOSIS — E11621 Type 2 diabetes mellitus with foot ulcer: Secondary | ICD-10-CM | POA: Insufficient documentation

## 2017-04-08 DIAGNOSIS — M545 Low back pain: Secondary | ICD-10-CM | POA: Insufficient documentation

## 2017-04-08 DIAGNOSIS — I739 Peripheral vascular disease, unspecified: Secondary | ICD-10-CM | POA: Diagnosis not present

## 2017-04-08 DIAGNOSIS — Z8673 Personal history of transient ischemic attack (TIA), and cerebral infarction without residual deficits: Secondary | ICD-10-CM | POA: Insufficient documentation

## 2017-04-08 DIAGNOSIS — Z7982 Long term (current) use of aspirin: Secondary | ICD-10-CM | POA: Insufficient documentation

## 2017-04-08 DIAGNOSIS — R32 Unspecified urinary incontinence: Secondary | ICD-10-CM | POA: Insufficient documentation

## 2017-04-08 DIAGNOSIS — I422 Other hypertrophic cardiomyopathy: Secondary | ICD-10-CM | POA: Insufficient documentation

## 2017-04-08 DIAGNOSIS — I6523 Occlusion and stenosis of bilateral carotid arteries: Secondary | ICD-10-CM | POA: Diagnosis not present

## 2017-04-08 HISTORY — DX: Unspecified urinary incontinence: R32

## 2017-04-08 HISTORY — PX: PERIPHERAL VASCULAR INTERVENTION: CATH118257

## 2017-04-08 HISTORY — PX: ABDOMINAL AORTOGRAM W/LOWER EXTREMITY: CATH118223

## 2017-04-08 HISTORY — DX: Peripheral vascular disease, unspecified: I73.9

## 2017-04-08 HISTORY — DX: Unspecified osteoarthritis, unspecified site: M19.90

## 2017-04-08 LAB — POCT I-STAT, CHEM 8
BUN: 45 mg/dL — AB (ref 6–20)
CHLORIDE: 107 mmol/L (ref 101–111)
CREATININE: 1.1 mg/dL — AB (ref 0.44–1.00)
Calcium, Ion: 1.13 mmol/L — ABNORMAL LOW (ref 1.15–1.40)
Glucose, Bld: 133 mg/dL — ABNORMAL HIGH (ref 65–99)
HEMATOCRIT: 36 % (ref 36.0–46.0)
Hemoglobin: 12.2 g/dL (ref 12.0–15.0)
Potassium: 4.9 mmol/L (ref 3.5–5.1)
SODIUM: 141 mmol/L (ref 135–145)
TCO2: 30 mmol/L (ref 0–100)

## 2017-04-08 LAB — GLUCOSE, CAPILLARY
GLUCOSE-CAPILLARY: 125 mg/dL — AB (ref 65–99)
GLUCOSE-CAPILLARY: 93 mg/dL (ref 65–99)

## 2017-04-08 LAB — POCT ACTIVATED CLOTTING TIME
ACTIVATED CLOTTING TIME: 191 s
Activated Clotting Time: 241 seconds
Activated Clotting Time: 180 seconds

## 2017-04-08 SURGERY — ABDOMINAL AORTOGRAM W/LOWER EXTREMITY
Anesthesia: LOCAL

## 2017-04-08 MED ORDER — PANTOPRAZOLE SODIUM 40 MG PO TBEC
40.0000 mg | DELAYED_RELEASE_TABLET | Freq: Every day | ORAL | Status: DC
  Administered 2017-04-08 – 2017-04-09 (×2): 40 mg via ORAL
  Filled 2017-04-08 (×2): qty 1

## 2017-04-08 MED ORDER — TIMOLOL MALEATE 0.5 % OP SOLN
1.0000 [drp] | Freq: Every day | OPHTHALMIC | Status: DC
  Administered 2017-04-09: 1 [drp] via OPHTHALMIC
  Filled 2017-04-08: qty 5

## 2017-04-08 MED ORDER — GUAIFENESIN-DM 100-10 MG/5ML PO SYRP: 15.0000 mL | ORAL_SOLUTION | ORAL | Status: DC | PRN

## 2017-04-08 MED ORDER — DOCUSATE SODIUM 100 MG PO CAPS
100.0000 mg | ORAL_CAPSULE | Freq: Two times a day (BID) | ORAL | Status: DC
  Administered 2017-04-08 – 2017-04-09 (×2): 100 mg via ORAL
  Filled 2017-04-08 (×2): qty 1

## 2017-04-08 MED ORDER — HEPARIN SODIUM (PORCINE) 1000 UNIT/ML IJ SOLN
INTRAMUSCULAR | Status: AC
  Filled 2017-04-08: qty 1

## 2017-04-08 MED ORDER — SODIUM CHLORIDE 0.9 % IV SOLN
1.0000 mL/kg/h | INTRAVENOUS | Status: DC
  Administered 2017-04-08: 1 mL/kg/h via INTRAVENOUS

## 2017-04-08 MED ORDER — LIDOCAINE HCL (PF) 1 % IJ SOLN
INTRAMUSCULAR | Status: AC
  Filled 2017-04-08: qty 30

## 2017-04-08 MED ORDER — MORPHINE SULFATE (PF) 10 MG/ML IV SOLN: 2.0000 mg | INTRAVENOUS | Status: DC | PRN

## 2017-04-08 MED ORDER — HYDRALAZINE HCL 20 MG/ML IJ SOLN: 5.0000 mg | INTRAMUSCULAR | Status: DC | PRN

## 2017-04-08 MED ORDER — ALUM & MAG HYDROXIDE-SIMETH 200-200-20 MG/5ML PO SUSP: 15.0000 mL | ORAL | Status: DC | PRN

## 2017-04-08 MED ORDER — SODIUM CHLORIDE 0.9 % IV SOLN
INTRAVENOUS | Status: DC
  Administered 2017-04-08: 11:00:00 via INTRAVENOUS

## 2017-04-08 MED ORDER — OXYCODONE HCL 5 MG PO TABS
5.0000 mg | ORAL_TABLET | ORAL | Status: DC | PRN
  Administered 2017-04-09: 5 mg via ORAL
  Filled 2017-04-08: qty 1

## 2017-04-08 MED ORDER — METOPROLOL TARTRATE 5 MG/5ML IV SOLN: 2.0000 mg | INTRAVENOUS | Status: DC | PRN

## 2017-04-08 MED ORDER — VITAMIN D3 25 MCG (1000 UNIT) PO TABS: 1000.0000 [IU] | ORAL_TABLET | ORAL | Status: DC

## 2017-04-08 MED ORDER — ACETAMINOPHEN 325 MG RE SUPP
325.0000 mg | RECTAL | Status: DC | PRN
  Filled 2017-04-08: qty 2

## 2017-04-08 MED ORDER — ONDANSETRON HCL 4 MG/2ML IJ SOLN: 4.0000 mg | Freq: Four times a day (QID) | INTRAMUSCULAR | Status: DC | PRN

## 2017-04-08 MED ORDER — SODIUM CHLORIDE 0.9 % IV SOLN: 250.0000 mL | INTRAVENOUS | Status: DC | PRN

## 2017-04-08 MED ORDER — INSULIN ASPART 100 UNIT/ML ~~LOC~~ SOLN
0.0000 [IU] | Freq: Three times a day (TID) | SUBCUTANEOUS | Status: DC
  Administered 2017-04-09 (×2): 7 [IU] via SUBCUTANEOUS

## 2017-04-08 MED ORDER — LEVOTHYROXINE SODIUM 125 MCG PO TABS
125.0000 ug | ORAL_TABLET | Freq: Every day | ORAL | Status: DC
  Administered 2017-04-09: 125 ug via ORAL
  Filled 2017-04-08: qty 1

## 2017-04-08 MED ORDER — FLUOXETINE HCL 20 MG PO CAPS
20.0000 mg | ORAL_CAPSULE | Freq: Every morning | ORAL | Status: DC
  Administered 2017-04-09: 20 mg via ORAL
  Filled 2017-04-08: qty 1

## 2017-04-08 MED ORDER — MEMANTINE HCL 10 MG PO TABS
10.0000 mg | ORAL_TABLET | Freq: Two times a day (BID) | ORAL | Status: DC
  Administered 2017-04-08 – 2017-04-09 (×2): 10 mg via ORAL
  Filled 2017-04-08: qty 2
  Filled 2017-04-08 (×2): qty 1
  Filled 2017-04-08: qty 2

## 2017-04-08 MED ORDER — ASPIRIN EC 81 MG PO TBEC
81.0000 mg | DELAYED_RELEASE_TABLET | Freq: Once | ORAL | Status: AC
  Administered 2017-04-09: 81 mg via ORAL

## 2017-04-08 MED ORDER — VITAMIN D (ERGOCALCIFEROL) 1.25 MG (50000 UNIT) PO CAPS
50000.0000 [IU] | ORAL_CAPSULE | ORAL | Status: DC
  Administered 2017-04-09: 50000 [IU] via ORAL
  Filled 2017-04-08: qty 1

## 2017-04-08 MED ORDER — MIDAZOLAM HCL 2 MG/2ML IJ SOLN
INTRAMUSCULAR | Status: AC
  Filled 2017-04-08: qty 2

## 2017-04-08 MED ORDER — FENTANYL CITRATE (PF) 100 MCG/2ML IJ SOLN
INTRAMUSCULAR | Status: AC
  Filled 2017-04-08: qty 2

## 2017-04-08 MED ORDER — VITAMIN C 500 MG PO TABS: 1000.0000 mg | ORAL_TABLET | ORAL | Status: DC

## 2017-04-08 MED ORDER — MIRABEGRON ER 50 MG PO TB24
50.0000 mg | ORAL_TABLET | Freq: Every evening | ORAL | Status: DC
  Administered 2017-04-08: 50 mg via ORAL
  Filled 2017-04-08 (×3): qty 1

## 2017-04-08 MED ORDER — METOPROLOL TARTRATE 25 MG PO TABS
25.0000 mg | ORAL_TABLET | Freq: Two times a day (BID) | ORAL | Status: DC
  Administered 2017-04-08 – 2017-04-09 (×2): 25 mg via ORAL
  Filled 2017-04-08 (×2): qty 1

## 2017-04-08 MED ORDER — HEPARIN SODIUM (PORCINE) 1000 UNIT/ML IJ SOLN
INTRAMUSCULAR | Status: DC | PRN
  Administered 2017-04-08: 8000 [IU] via INTRAVENOUS

## 2017-04-08 MED ORDER — LABETALOL HCL 5 MG/ML IV SOLN: 10.0000 mg | INTRAVENOUS | Status: DC | PRN

## 2017-04-08 MED ORDER — BRIMONIDINE TARTRATE-TIMOLOL 0.2-0.5 % OP SOLN
1.0000 [drp] | Freq: Every day | OPHTHALMIC | Status: DC
  Filled 2017-04-08: qty 5

## 2017-04-08 MED ORDER — MORPHINE SULFATE (PF) 4 MG/ML IV SOLN
INTRAVENOUS | Status: AC
  Administered 2017-04-08: 2 mg via INTRAVENOUS
  Filled 2017-04-08: qty 1

## 2017-04-08 MED ORDER — LOSARTAN POTASSIUM 25 MG PO TABS
25.0000 mg | ORAL_TABLET | Freq: Every day | ORAL | Status: DC
Start: 2017-04-09 — End: 2017-04-09
  Administered 2017-04-09: 25 mg via ORAL
  Filled 2017-04-08: qty 1

## 2017-04-08 MED ORDER — MORPHINE SULFATE (PF) 2 MG/ML IV SOLN: 2.0000 mg | INTRAVENOUS | Status: DC | PRN

## 2017-04-08 MED ORDER — MIDAZOLAM HCL 2 MG/2ML IJ SOLN
INTRAMUSCULAR | Status: DC | PRN
  Administered 2017-04-08 (×3): 0.5 mg via INTRAVENOUS

## 2017-04-08 MED ORDER — BRIMONIDINE TARTRATE 0.2 % OP SOLN
1.0000 [drp] | Freq: Every day | OPHTHALMIC | Status: DC
  Administered 2017-04-09: 1 [drp] via OPHTHALMIC
  Filled 2017-04-08: qty 5

## 2017-04-08 MED ORDER — ACETAMINOPHEN 325 MG PO TABS: 325.0000 mg | ORAL_TABLET | ORAL | Status: DC | PRN

## 2017-04-08 MED ORDER — SODIUM CHLORIDE 0.9% FLUSH
3.0000 mL | Freq: Two times a day (BID) | INTRAVENOUS | Status: DC
  Administered 2017-04-09: 3 mL via INTRAVENOUS

## 2017-04-08 MED ORDER — HEPARIN (PORCINE) IN NACL 2-0.9 UNIT/ML-% IJ SOLN
INTRAMUSCULAR | Status: AC | PRN
  Administered 2017-04-08: 1000 mL

## 2017-04-08 MED ORDER — FENTANYL CITRATE (PF) 100 MCG/2ML IJ SOLN
INTRAMUSCULAR | Status: DC | PRN
  Administered 2017-04-08 (×5): 25 ug via INTRAVENOUS

## 2017-04-08 MED ORDER — DONEPEZIL HCL 10 MG PO TABS
10.0000 mg | ORAL_TABLET | Freq: Every day | ORAL | Status: DC
  Administered 2017-04-08: 10 mg via ORAL
  Filled 2017-04-08: qty 1

## 2017-04-08 MED ORDER — LIDOCAINE HCL (PF) 1 % IJ SOLN
INTRAMUSCULAR | Status: DC | PRN
  Administered 2017-04-08: 10 mL

## 2017-04-08 MED ORDER — FELODIPINE ER 10 MG PO TB24
10.0000 mg | ORAL_TABLET | Freq: Every day | ORAL | Status: DC
  Administered 2017-04-08 – 2017-04-09 (×2): 10 mg via ORAL
  Filled 2017-04-08 (×3): qty 1

## 2017-04-08 MED ORDER — SODIUM CHLORIDE 0.9% FLUSH: 3.0000 mL | INTRAVENOUS | Status: DC | PRN

## 2017-04-08 MED ORDER — QUETIAPINE FUMARATE 25 MG PO TABS
25.0000 mg | ORAL_TABLET | Freq: Every day | ORAL | Status: DC
  Administered 2017-04-08: 25 mg via ORAL
  Filled 2017-04-08: qty 1

## 2017-04-08 MED ORDER — MORPHINE SULFATE (PF) 4 MG/ML IV SOLN
2.0000 mg | INTRAVENOUS | Status: DC | PRN
  Administered 2017-04-08: 2 mg via INTRAVENOUS

## 2017-04-08 MED ORDER — IODIXANOL 320 MG/ML IV SOLN
INTRAVENOUS | Status: DC | PRN
  Administered 2017-04-08: 150 mL via INTRA_ARTERIAL

## 2017-04-08 MED ORDER — PHENOL 1.4 % MT LIQD: 1.0000 | OROMUCOSAL | Status: DC | PRN

## 2017-04-08 SURGICAL SUPPLY — 29 items
BALLN ARMADA 3.0X120X150 (BALLOONS) ×1
BALLN ARMADA 3X120X150 (BALLOONS) ×2
BALLN STERLING OTW 4X150X150 (BALLOONS) ×3
BALLOON ARMADA 3X120X150 (BALLOONS) ×2 IMPLANT
BALLOON STERLING OTW 4X150X150 (BALLOONS) ×2 IMPLANT
CATH ANGIO 5F BER2 65CM (CATHETERS) ×3 IMPLANT
CATH OMNI FLUSH 5F 65CM (CATHETERS) ×3 IMPLANT
CATH QUICKCROSS .018X135CM (MICROCATHETER) ×3 IMPLANT
CATH QUICKCROSS SUPP .035X90CM (MICROCATHETER) ×3 IMPLANT
CATH SOFT-VU 4F 65 STRAIGHT (CATHETERS) ×2 IMPLANT
CATH SOFT-VU STRAIGHT 4F 65CM (CATHETERS) ×1
DEVICE TORQUE .025-.038 (MISCELLANEOUS) ×3 IMPLANT
GUIDEWIRE ANGLED .035X150CM (WIRE) ×3 IMPLANT
KIT ENCORE 26 ADVANTAGE (KITS) ×3 IMPLANT
KIT MICROINTRODUCER STIFF 5F (SHEATH) ×3 IMPLANT
KIT PV (KITS) ×3 IMPLANT
SHEATH FLEXOR ANSEL 1 7F 45CM (SHEATH) ×3 IMPLANT
SHEATH PINNACLE 5F 10CM (SHEATH) ×3 IMPLANT
STENT VIABAHN 5X150X120 (Permanent Stent) ×3 IMPLANT
STOPCOCK MORSE 400PSI 3WAY (MISCELLANEOUS) ×3 IMPLANT
SYRINGE MEDRAD AVANTA MACH 7 (SYRINGE) ×3 IMPLANT
TRANSDUCER W/STOPCOCK (MISCELLANEOUS) ×3 IMPLANT
TRAY PV CATH (CUSTOM PROCEDURE TRAY) ×3 IMPLANT
TUBING CIL FLEX 10 FLL-RA (TUBING) ×3 IMPLANT
WIRE AMPLATZ SS-J .035X260CM (WIRE) ×3 IMPLANT
WIRE BENTSON .035X145CM (WIRE) ×3 IMPLANT
WIRE G V18X300CM (WIRE) ×3 IMPLANT
WIRE ROSEN-J .035X260CM (WIRE) ×3 IMPLANT
WIRE SPARTACORE .014X300CM (WIRE) ×3 IMPLANT

## 2017-04-08 NOTE — Op Note (Signed)
    Patient name: [name] [name] MRN: [id number] DOB: 1934-07-12 Sex: female  04/08/2017 Pre-operative Diagnosis: critical left lower extremity ischemia Post-operative diagnosis:  Same Surgeon:  Erlene Quan C. Donzetta Matters, MD Procedure Performed: 1.  US guided cannulation of right common femoral artery 2.  Stent of left popliteal artery with 5 x 111mm viabahn 3.  Moderate sedation with fentanyl and versed for 91 minutes  Indications:  83 year old female history of left foot ulceration with depressed ABIs. She is indicated for aortogram with possible intervention.  Findings: The aortoiliac segments are tortuous bilaterally. The takeoff of the left SFA had a 50% non-flow-limiting stenosis at the the middle SFA on the left. After the adductor hiatus there was an occlusion that reconstituted a below-knee popliteal artery. After crossing this in a subintimal plane we confirmed intraluminal access and primarily stented with viabahn leaving no residual occlusion/stenosis. At completion there was runoff via the left anterior tibial artery to dorsalis pedis artery at the foot.   Procedure:  The patient was identified in the holding area and taken to room 8.  The patient was then placed supine on the table and prepped and draped in the usual sterile fashion.  A time out was called.  Ultrasound was used to evaluate the right common femoral artery.  It was patent .  A digital ultrasound image was acquired.  A micropuncture needle was used to access the right common femoral artery under ultrasound guidance.  An 018 wire was advanced without resistance and a micropuncture sheath was placed.  The 018 wire was removed and a benson wire was placed.  The micropuncture sheath was exchanged for a 5 french sheath.  An omniflush catheter was advanced over the wire to the level of L-1.  An abdominal angiogram was obtained followed by bilateral lower extremity runoff and which the right lower extremity was incompletely evaluated but  didn't appear to have in-line flow to the foot although disease. We elected to intervene on the left SFA/popliteal artery occlusion. We crossed over the bifurcation using Glidewire and quick cross catheter and then placed a Rosen wire. A long 7 French sheath was then placed into the left common femoral artery. We then using the 18 wire and 018 quick cross catheter to cross in a subintimal plane and then we confirmed intraluminal access below the knee. We then balloon dilated the subintimal plane with 3 mm balloon and brought our viabahn to the same area and deployed it. We then ironed this out with 4 mm balloon throughout. Completion angiogram demonstrated 0% residual stenosis through the stent with runoff through the anterior tibial artery. There was a non-flow-limiting stenosis at the proximal anterior tibial artery. Satisfied the sheath was retracted into the right external iliac artery and catheters and wires removed. Patient tolerated procedure well without immediate complication.  Contrast: 150cc  Geneive Sandstrom C. Donzetta Matters, MD [facility] [place] Office: [phone] Pager: [phone]

## 2017-04-08 NOTE — Progress Notes (Signed)
Site area: Right groin a 7 french arterial sheath was removed  Site Prior to Removal:  Level 0  Pressure Applied For 20 MINUTES    Bedrest Beginning at 1720p Manual:   Yes.    Patient Status During Pull:  stable  Post Pull Groin Site:  Level 0  Post Pull Instructions Given:  Yes.    Post Pull Pulses Present:  Yes.    Dressing Applied:  Yes.    Comments:  VS remain stable during sheath pull.

## 2017-04-08 NOTE — Progress Notes (Signed)
Pt with bleeding from right groin puncture and hematoma.  Currently pressure being held. Pt not hypotensive.  Will admit for observation.  [name], MD [facility] [place] Office: [phone] Pager: [phone]

## 2017-04-08 NOTE — Progress Notes (Signed)
Pt with history of dementia, poor historian at times, spoke with daughter over phone (not here) for preop information when needed, work # [phone].

## 2017-04-08 NOTE — H&P (Signed)
   History and Physical Update  The patient was interviewed and re-examined.  The patient's previous History and Physical has been reviewed and is unchanged from Dr. Luther Parody note. Plan for Aortogram with ble runoff possible intervention on the left.   Brandon C. Donzetta Matters, MD [facility] [place] Office: [phone] Pager: [phone]   04/08/2017, 11:52 AM

## 2017-04-08 NOTE — Discharge Instructions (Signed)
Angiogram, Care After °This sheet gives you information about how to care for yourself after your procedure. Your health care provider may also give you more specific instructions. If you have problems or questions, contact your health care provider. °What can I expect after the procedure? °After the procedure, it is common to have bruising and tenderness at the catheter insertion area. °Follow these instructions at home: °Insertion site care  °· Follow instructions from your health care provider about how to take care of your insertion site. Make sure you: °¨ Wash your hands with soap and water before you change your bandage (dressing). If soap and water are not available, use hand sanitizer. °¨ Change your dressing as told by your health care provider. °¨ Leave stitches (sutures), skin glue, or adhesive strips in place. These skin closures may need to stay in place for 2 weeks or longer. If adhesive strip edges start to loosen and curl up, you may trim the loose edges. Do not remove adhesive strips completely unless your health care provider tells you to do that. °· Do not take baths, swim, or use a hot tub until your health care provider approves. °· You may shower 24-48 hours after the procedure or as told by your health care provider. °¨ Gently wash the site with plain soap and water. °¨ Pat the area dry with a clean towel. °¨ Do not rub the site. This may cause bleeding. °· Do not apply powder or lotion to the site. Keep the site clean and dry. °· Check your insertion site every day for signs of infection. Check for: °¨ Redness, swelling, or pain. °¨ Fluid or blood. °¨ Warmth. °¨ Pus or a bad smell. °Activity  °· Rest as told by your health care provider, usually for 1-2 days. °· Do not lift anything that is heavier than 10 lbs. (4.5 kg) or as told by your health care provider. °· Do not drive for 24 hours if you were given a medicine to help you relax (sedative). °· Do not drive or use heavy machinery while  taking prescription pain medicine. °General instructions  °· Return to your normal activities as told by your health care provider, usually in about a week. Ask your health care provider what activities are safe for you. °· If the catheter site starts bleeding, lie flat and put pressure on the site. If the bleeding does not stop, get help right away. This is a medical emergency. °· Drink enough fluid to keep your urine clear or pale yellow. This helps flush the contrast dye from your body. °· Take over-the-counter and prescription medicines only as told by your health care provider. °· Keep all follow-up visits as told by your health care provider. This is important. °Contact a health care provider if: °· You have a fever or chills. °· You have redness, swelling, or pain around your insertion site. °· You have fluid or blood coming from your insertion site. °· The insertion site feels warm to the touch. °· You have pus or a bad smell coming from your insertion site. °· You have bruising around the insertion site. °· You notice blood collecting in the tissue around the catheter site (hematoma). The hematoma may be painful to the touch. °Get help right away if: °· You have severe pain at the catheter insertion area. °· The catheter insertion area swells very fast. °· The catheter insertion area is bleeding, and the bleeding does not stop when you hold steady pressure on   the area. °· The area near or just beyond the catheter insertion site becomes pale, cool, tingly, or numb. °These symptoms may represent a serious problem that is an emergency. Do not wait to see if the symptoms will go away. Get medical help right away. Call your local emergency services (911 in the [place] Do not drive yourself to the hospital. °Summary °· After the procedure, it is common to have bruising and tenderness at the catheter insertion area. °· After the procedure, it is important to rest and drink plenty of fluids. °· Do not take baths,  swim, or use a hot tub until your health care provider says it is okay to do so. You may shower 24-48 hours after the procedure or as told by your health care provider. °· If the catheter site starts bleeding, lie flat and put pressure on the site. If the bleeding does not stop, get help right away. This is a medical emergency. °This information is not intended to replace advice given to you by your health care provider. Make sure you discuss any questions you have with your health care provider. °Document Released: 03/20/2005 Document Revised: 08/06/2016 Document Reviewed: 08/06/2016 °Elsevier Interactive Patient Education © 2017 [employer] ° °

## 2017-04-08 NOTE — Progress Notes (Addendum)
Pt was placed on bed pan, post urination pt had blood and hematoma, pressure dressing removed and pressure being held by Benita Gutter Rn , Dr Oneida Alar is on call and was paged via after hours from his office . waiting for orders.  Pressure was held by myself and Harriette Bouillon for 1 hour. Doppler right DP same as prior. Hematoma reduced, right groin is bruised and skin open near R groin ligament. Dr Oneida Alar came while pressure was being held and will be back to reassess.

## 2017-04-09 ENCOUNTER — Telehealth: Payer: Self-pay | Admitting: Vascular Surgery

## 2017-04-09 ENCOUNTER — Encounter (HOSPITAL_COMMUNITY): Payer: Self-pay | Admitting: Vascular Surgery

## 2017-04-09 DIAGNOSIS — E1151 Type 2 diabetes mellitus with diabetic peripheral angiopathy without gangrene: Secondary | ICD-10-CM | POA: Diagnosis not present

## 2017-04-09 LAB — CBC
HCT: 33.6 % — ABNORMAL LOW (ref 36.0–46.0)
Hemoglobin: 10.7 g/dL — ABNORMAL LOW (ref 12.0–15.0)
MCH: 32.2 pg (ref 26.0–34.0)
MCHC: 31.8 g/dL (ref 30.0–36.0)
MCV: 101.2 fL — ABNORMAL HIGH (ref 78.0–100.0)
PLATELETS: 163 10*3/uL (ref 150–400)
RBC: 3.32 MIL/uL — AB (ref 3.87–5.11)
RDW: 13.8 % (ref 11.5–15.5)
WBC: 7.4 10*3/uL (ref 4.0–10.5)

## 2017-04-09 LAB — BASIC METABOLIC PANEL
ANION GAP: 7 (ref 5–15)
BUN: 21 mg/dL — ABNORMAL HIGH (ref 6–20)
CALCIUM: 8.7 mg/dL — AB (ref 8.9–10.3)
CHLORIDE: 106 mmol/L (ref 101–111)
CO2: 25 mmol/L (ref 22–32)
Creatinine, Ser: 0.94 mg/dL (ref 0.44–1.00)
GFR calc non Af Amer: 55 mL/min — ABNORMAL LOW (ref >60)
GLUCOSE: 153 mg/dL — AB (ref 65–99)
Potassium: 3.8 mmol/L (ref 3.5–5.1)
Sodium: 138 mmol/L (ref 135–145)

## 2017-04-09 LAB — GLUCOSE, CAPILLARY: GLUCOSE-CAPILLARY: 231 mg/dL — AB (ref 65–99)

## 2017-04-09 LAB — MRSA PCR SCREENING: MRSA by PCR: POSITIVE — AB

## 2017-04-09 MED ORDER — ASPIRIN EC 81 MG PO TBEC
81.0000 mg | DELAYED_RELEASE_TABLET | Freq: Every day | ORAL | Status: DC
  Administered 2017-04-09: 81 mg via ORAL
  Filled 2017-04-09: qty 1

## 2017-04-09 MED ORDER — ASPIRIN 81 MG PO TBEC: 81.0000 mg | DELAYED_RELEASE_TABLET | Freq: Every day | ORAL | Status: DC

## 2017-04-09 NOTE — Discharge Summary (Signed)
Discharge Summary    [name] [name] [birth year] 83 y.o. female  [id number]  Admission Date: 04/08/2017  Discharge Date: 04/09/17  Physician: Thomes Lolling*  Admission Diagnosis: pvd with non-healing ulcer on LLE   HPI:   This is a 83 y.o. female  here today for evaluation of nonhealing wound in her left lateral foot. She is well-known to me from prior evaluation of peripheral vascular disease and carotid disease. She had undergone a formal arteriogram 2016. That time she was found to have occlusion of her superficial femoral artery with reconstitution of below-knee popliteal artery. Is felt that conservative treatment was warranted at that time. She now reports progressive severe intolerable pain in her left foot and ulceration over the left lateral foot and also over excoriation in her great toe. She does have dementia but this is stable and is actually quite remarkable in her memory on most things. She is here to get today with her long-term caregiver.  Hospital Course:  The patient was admitted to the hospital and taken to the operating room on 04/08/2017 and underwent: 1.  US guided cannulation of right common femoral artery 2.  Stent of left popliteal artery with 5 x 121mm viabahn 3.  Moderate sedation with fentanyl and versed for 91 minutes  Findings as follows: Findings: The aortoiliac segments are tortuous bilaterally. The takeoff of the left SFA had a 50% non-flow-limiting stenosis at the the middle SFA on the left. After the adductor hiatus there was an occlusion that reconstituted a below-knee popliteal artery. After crossing this in a subintimal plane we confirmed intraluminal access and primarily stented with viabahn leaving no residual occlusion/stenosis. At completion there was runoff via the left anterior tibial artery to dorsalis pedis artery at the foot.  The pt tolerated the procedure well and was transported to the PACU in good condition.   Later that  evening, pt had bleeding from right groin puncture and hematoma.  Pressure held and pt not hypotensive.  She was admitted for observation.  By POD 1, she was doing well.  There was a hematoma present in the right groin.  Right PT signal and left DP signal present.  She will resume her Eliquis on 04/10/17 if no evidence of bleeding.    The remainder of the hospital course consisted of increasing mobilization and increasing intake of solids without difficulty.  CBC    Component Value Date/Time   WBC 7.4 04/09/2017 0227   RBC 3.32 (L) 04/09/2017 0227   HGB 10.7 (L) 04/09/2017 0227   HCT 33.6 (L) 04/09/2017 0227   PLT 163 04/09/2017 0227   MCV 101.2 (H) 04/09/2017 0227   MCH 32.2 04/09/2017 0227   MCHC 31.8 04/09/2017 0227   RDW 13.8 04/09/2017 0227   LYMPHSABS 1.0 09/08/2016 0500   MONOABS 1.0 09/08/2016 0500   EOSABS 0.2 09/08/2016 0500   BASOSABS 0.0 09/08/2016 0500    BMET    Component Value Date/Time   NA 138 04/09/2017 0227   K 3.8 04/09/2017 0227   CL 106 04/09/2017 0227   CO2 25 04/09/2017 0227   GLUCOSE 153 (H) 04/09/2017 0227   BUN 21 (H) 04/09/2017 0227   CREATININE 0.94 04/09/2017 0227   CALCIUM 8.7 (L) 04/09/2017 0227   GFRNONAA 55 (L) 04/09/2017 0227   GFRAA >60 04/09/2017 0227      Discharge Instructions    Discharge instructions    Complete by:  As directed    Restart Eliquis on 04/10/17 if no evidence  of bleeding.   Discharge patient    Complete by:  As directed    Discharge disposition:  01-Home or Self Care   Discharge patient date:  04/09/2017      Discharge Diagnosis:  pvd with non-healing ulcer on LLE  Secondary Diagnosis: Patient Active Problem List   Diagnosis Date Noted  . PAD (peripheral artery disease) ([place]) 04/08/2017  . Late onset Alzheimer's disease without behavioral disturbance 02/12/2016  . Atrial fibrillation ([place]) 02/12/2016  . Gastroparesis due to DM ([place]) 02/12/2016  . Diabetes mellitus due to underlying condition with  diabetic retinopathy 02/12/2016  . Carotid stenosis 10/01/2015  . Abnormality of gait 08/13/2015  . Hypertrophic cardiomyopathy ([place]) 08/30/2014  . Other specified transient cerebral ischemias 08/02/2014  . Chronic atrial fibrillation ([place]) 08/02/2014  . Lewy body dementia with behavioral disturbance 02/21/2014  . Peripheral vascular disease ([place]) 01/18/2014  . Stautus post total shoulder arthroplasty 12/15/2013  . DM2 (diabetes mellitus, type 2) ([place]) 12/08/2013  . Constipation 12/08/2013  . Hyperlipemia 12/08/2013  . Dementia 12/08/2013  . Unspecified hypothyroidism 10/30/2013  . Osteoarthritis of right shoulder 10/07/2013  . Fall at home 05/15/2013  . Moderate vision impairment-both eyes 05/15/2013  . Progressive dementia with uncertain etiology 98/33/8250  . Obesity (BMI 30-39.9) 05/15/2013  . Rhegmatogenous retinal detachment of right eye 01/10/2013  . Hypertension   . Thyroid disease   . Depression   . IDDM (insulin dependent diabetes mellitus) ([place])   . GERD (gastroesophageal reflux disease)   . CAD (coronary artery disease)   . Bilateral carotid artery stenosis   . Obesity   . Degenerative joint disease   . Hiatal hernia    Past Medical History:  Diagnosis Date  . Anemia   . Anxiety   . Arthritis    "right wrist, lower back, maybe my right shoulder" (04/08/2017)  . Atrial fibrillation ([place])   . Bilateral carotid artery stenosis   . Bowel incontinence   . CAD (coronary artery disease)    a. s/p CABG 2012.  [name] Chronic lower back pain    "back is very stiff because of fusion; can't stand up straight more than few moments"  . Closed fracture of unspecified part of neck of femur   . Complication of anesthesia    anesth. meds caused hallucinations   . Cough    no fever, chronic  . Degenerative joint disease   . Dementia   . Depression   . Diverticulitis   . Endometriosis   . Fall at home 05/15/2013  . GERD (gastroesophageal reflux disease)   . Headache   .  Hiatal hernia   . History of blood transfusion   . History of wheezing   . HLD (hyperlipidemia)   . Hx of cardiovascular stress test    Lexiscan Myoview (11/15):  Low risk stress nuclear study with a small, mild fixed apical septal perfusion defect. EF is 52% with mild septal hypokinesis. No ischemia.  [name] Hx of echocardiogram    Echo (11/15):  Mod LVH, severe septal hypertrophy (suggestive HCM), no LVOT obstruction, rest gradient 15 mmHg across AV, vigorous LVF, EF 65-70%, no RWMA, Gr 1 DD, Ao sclerosis (no stenosis), mod LAE, mild RAE, mod TR, PASP 42 mmHg  . Hypertension   . Hypertonicity of bladder   . Hypertrophic cardiomyopathy ([place])   . Hypothyroidism   . Lumbago   . Muscle weakness (generalized)   . Obesity   . Osteoarthrosis, unspecified whether generalized or localized, unspecified site   .  Progressive dementia with uncertain etiology 9/98/3382   Suspect Lewy body .  [name] Reflux esophagitis   . Stroke [facility]) 2011   "old very small TIA/neurologist"  . Type II diabetes mellitus ([place])   . Ulcer    left foot x 2 years sees dr Little Ishikawa podiastrist for wound staying same tiny wart appearing areas no drainage changes dressing every 3 days  . Unspecified glaucoma(365.9)   . Urinary incontinence      Allergies as of 04/09/2017      Reactions   Amitriptyline Other (See Comments)   hallucinations   Vesicare [solifenacin] Other (See Comments)   hallucinations   Vicodin [hydrocodone-acetaminophen] Other (See Comments)   Hallucinations - do NOT verify Vicodin orders   Polysaccharide Iron Complex Hives, Itching, Rash   Patient can tolerate Ferrous Sulfate      Medication List    TAKE these medications   acetaminophen 650 MG CR tablet Commonly known as:  TYLENOL Take 650 mg by mouth every 8 (eight) hours as needed for pain.   apixaban 5 MG Tabs tablet Commonly known as:  ELIQUIS Take 1 tablet (5 mg total) by mouth 2 (two) times daily.   aspirin 81 MG EC tablet Take 1 tablet  (81 mg total) by mouth daily.   cholecalciferol 1000 units tablet Commonly known as:  VITAMIN D Take 1,000 Units by mouth See admin instructions. On Monday, Tuesday, Wednesday, Friday, Saturday.   COMBIGAN 0.2-0.5 % ophthalmic solution Generic drug:  brimonidine-timolol Place 1-2 drops into both eyes daily.   donepezil 10 MG tablet Commonly known as:  ARICEPT Take 1 tablet (10 mg total) by mouth at bedtime.   ergocalciferol 50000 units capsule Commonly known as:  VITAMIN D2 Take 50,000 Units by mouth 2 (two) times a week. On Thursday and Sunday   felodipine 10 MG 24 hr tablet Commonly known as:  PLENDIL Take 10 mg by mouth daily.   FLUoxetine 20 MG capsule Commonly known as:  PROZAC Take 20 mg by mouth every morning.   glipiZIDE 5 MG 24 hr tablet Commonly known as:  GLUCOTROL XL Take 5 mg by mouth at bedtime.   JANUVIA 100 MG tablet Generic drug:  sitaGLIPtin Take 100 mg by mouth every morning.   LANTUS SOLOSTAR 100 UNIT/ML Solostar Pen Generic drug:  Insulin Glargine Inject 10 Units into the skin at bedtime.   levothyroxine 125 MCG tablet Commonly known as:  SYNTHROID, LEVOTHROID Take 125 mcg by mouth daily before breakfast.   losartan 25 MG tablet Commonly known as:  COZAAR Take 25 mg by mouth daily.   memantine 10 MG tablet Commonly known as:  NAMENDA Take 1 tablet (10 mg total) by mouth 2 (two) times daily.   metoprolol tartrate 25 MG tablet Commonly known as:  LOPRESSOR Take 25 mg by mouth 2 (two) times daily.   mirabegron ER 50 MG Tb24 tablet Commonly known as:  MYRBETRIQ Take 50 mg by mouth every evening.   pioglitazone 30 MG tablet Commonly known as:  ACTOS Take 30 mg by mouth every morning.   pravastatin 80 MG tablet Commonly known as:  PRAVACHOL take 1 tablet by mouth once daily What changed:  See the new instructions.   prednisoLONE sodium phosphate 1 % ophthalmic solution Commonly known as:  INFLAMASE FORTE Place 1 drop into the right  eye 3 (three) times daily.   QUEtiapine 25 MG tablet Commonly known as:  SEROQUEL Take 1 tablet (25 mg total) by mouth at bedtime.  silver sulfADIAZINE 1 % cream Commonly known as:  SILVADENE Apply 1 application topically daily.   vitamin C 1000 MG tablet Take 1,000 mg by mouth 3 (three) times a week.       Prescriptions given: Start aspirin 81mg  daily (No Rx given)   Instructions:   [facility]  Discharge Instructions  Lower Extremity Angiogram; Angioplasty/Stenting  Please refer to the following instructions for your post-procedure care. Your surgeon or physician assistant will discuss any changes with you.  Activity  Avoid lifting more than 8 pounds (1 gallons of milk) for 72 hours (3 days) after your procedure. You may walk as much as you can tolerate. It's OK to drive after 72 hours.  Bathing/Showering  You may shower the day after your procedure. If you have a bandage, you may remove it at 24- 48 hours. Clean your incision site with mild soap and water. Pat the area dry with a clean towel.  Diet  Resume your pre-procedure diet. There are no special food restrictions following this procedure. All patients with peripheral vascular disease should follow a low fat/low cholesterol diet. In order to heal from your surgery, it is CRITICAL to get adequate nutrition. Your body requires vitamins, minerals, and protein. Vegetables are the best source of vitamins and minerals. Vegetables also provide the perfect balance of protein. Processed food has little nutritional value, so try to avoid this.  Medications  Resume taking all of your medications unless your doctor tells you not to. If your incision is causing pain, you may take over-the-counter pain relievers such as acetaminophen (Tylenol)  Follow Up  Follow up will be arranged at the time of your procedure. You may have an office visit scheduled or may be scheduled for surgery. Ask your  surgeon if you have any questions.  Please call us immediately for any of the following conditions: .Severe or worsening pain your legs or feet at rest or with walking. .Increased pain, redness, drainage at your groin puncture site. .Fever of 101 degrees or higher. .If you have any mild or slow bleeding from your puncture site: lie down, apply firm constant pressure over the area with a piece of gauze or a clean wash cloth for 30 minutes- no peeking!, call 911 right away if you are still bleeding after 30 minutes, or if the bleeding is heavy and unmanageable.  Reduce your risk factors of vascular disease:  Stop smoking. If you would like help call [facility] at 1-800-QUIT-NOW (915)722-6596) or [place] at 6313622006. Manage your cholesterol Maintain a desired weight Control your diabetes Keep your blood pressure down  If you have any questions, please call the office at [phone]  Disposition: home  Patient's condition: is Good  Follow up: 1. Dr. Donnetta Hutching in 4 weeks with duplex and abi's.   Leontine Locket, PA-C [facility] [phone] 04/09/2017  11:33 AM

## 2017-04-09 NOTE — Care Management Note (Addendum)
Case Management Note  Patient Details  Name: [name] [name] MRN: [id number] Date of Birth: 04/29/1934  Subjective/Objective: pt presented for PAD- plan for d.c home today. Pt is from home alone and has support of daughter. Pt was previously active with [facility] PT/ OT- RN to be added.                   Action/Plan: CM did call referral to [facility]- awaiting call back for confirmation of coverage for patient. CM did provide daughter with [address] [employer] for additional assistance in the home. Daughter [name] is aware this is an extra out of pocket cost. Daughter to pick the patient up at 2:30. No further needs from CM at this time.   Expected Discharge Date:  04/09/17               Expected Discharge Plan:  [place]  In-House Referral:  NA  Discharge planning Services  CM Consult  Post Acute Care Choice:  Home Health Choice offered to:  Patient, Adult Children  DME Arranged:  N/A DME Agency:  NA  HH Arranged:  RN, OT, PT [place] Agency:  Other - See comment ([facility])  Status of Service:  Completed, signed off  If discussed at [place] of Stay Meetings, dates discussed:    Additional Comments: 1547 04-09-17 Jacqlyn Krauss, RN,BSN [phone] CM did get confirmation for pt in regards to home Care. [facility] to f/u post 24-48 hours post d/c. No further needs from CM at this time.  Bethena Roys, RN 04/09/2017, 2:31 PM

## 2017-04-09 NOTE — Progress Notes (Signed)
Patient being discharged home.  Patient with history of confusion, dementia.  Daughter not able to come to bedside for discharge instructions.  Called daughter [name] on telephone to review discharge instructions, she stated her understanding.  Reviewed post angiogram instructions and when to notify the physician.  Patient discharged home with daughter via wheelchair.  Sanda Linger

## 2017-04-09 NOTE — Telephone Encounter (Signed)
Sched labs 05/26/17 at 3:00 and MD at 06/02/17 at 1:00. Lm on hm#.

## 2017-04-09 NOTE — Care Management Obs Status (Signed)
[facility] NOTIFICATION   Patient Details  Name: SHAMONE WINZER MRN: [id number] Date of Birth: 1933/12/10   Medicare Observation Status Notification Given:  Yes    Bethena Roys, RN 04/09/2017, 2:20 PM

## 2017-04-09 NOTE — Evaluation (Signed)
Physical Therapy Evaluation Patient Details Name: [name] [name] MRN: [id number] DOB: 1933-11-04 Today's Date: 04/09/2017   History of Present Illness  Pt is as 83 y/o s/p US guided cannulation of right common femoral artery and stent placement in L popliteal artery. PMH includes dementia, depression, L foot ulcer, PAD, HTN, DM, a fib, and CAD s/p CABG.  Clinical Impression  Pt s/p procedure above with deficits below. PTA, pt had an aide assist her one day/week, and was using RW for mobility. Upon eval, pt very sleepy, and falling asleep when being asked questions about home. Increased alertness when up and moving. Pt with weakness and slow motor planning when ambulating. Required increased time to perform short distance to bathroom. Required min guard for safety. Educated pt about having someone at home to assist her 24/7 upon d/c, however, unsure of daughters availability. Did have a friend she was going to call and ask about staying with her. Reports her daughter is working on getting her more time with an aide present throughout the week. Feel pt would benefit from SNF, however, pt wanting to go home. Will need HHPT, HHOT and HHaide to ensure safety. Will continue to follow acutely to maximize functional mobility independence.     Follow Up Recommendations Home health PT (HHOT, [facility])    Equipment Recommendations  None recommended by PT (Has all DME )    Recommendations for Other Services OT consult     Precautions / Restrictions Precautions Precautions: Fall Restrictions Weight Bearing Restrictions: No      Mobility  Bed Mobility               General bed mobility comments: In recliner upon entry.   Transfers Overall transfer level: Needs assistance Equipment used: Rolling walker (2 wheeled) Transfers: Sit to/from Stand Sit to Stand: Min guard         General transfer comment: Min guard for safety. Required cues for safe hand placement.    Ambulation/Gait Ambulation/Gait assistance: Min guard Ambulation Distance (Feet): 25 Feet Assistive device: Rolling walker (2 wheeled) Gait Pattern/deviations: Step-through pattern;Decreased stride length;Trunk flexed Gait velocity: Decreased Gait velocity interpretation: Below normal speed for age/gender General Gait Details: VERY slow, guarded gait. Mild unsteadiness, however, no LOB. Pt very drowsy, so ambulation tolerance limited. Verbal cues required for upright posture and appropriate sequencing with RW.   Stairs            Wheelchair Mobility    Modified Rankin (Stroke Patients Only)       Balance Overall balance assessment: Needs assistance Sitting-balance support: No upper extremity supported;Feet supported Sitting balance-Leahy Scale: Fair     Standing balance support: Bilateral upper extremity supported;During functional activity Standing balance-Leahy Scale: Poor Standing balance comment: Reliant on BUE on RW                              Pertinent Vitals/Pain Pain Assessment: Faces Faces Pain Scale: Hurts little more Pain Location: groin  Pain Descriptors / Indicators: Operative site guarding;Sore Pain Intervention(s): Limited activity within patient's tolerance;Monitored during session;Repositioned    Home Living Family/patient expects to be discharged to:: Private residence Living Arrangements: Alone Available Help at Discharge: Personal care attendant Type of Home: House Home Access: Stairs to enter Entrance Stairs-Rails: Right;Left;Can reach both Entrance Stairs-Number of Steps: 5 Home Layout: One level Home Equipment: Shower seat;Grab bars - tub/shower;Cane - single point Additional Comments: Has an aide that comes in on friday  for 6 hours. Looking to get more days.     Prior Function Level of Independence: Independent with assistive device(s)         Comments: Used RW at baseline      Hand Dominance         Extremity/Trunk Assessment   Upper Extremity Assessment Upper Extremity Assessment: Generalized weakness    Lower Extremity Assessment Lower Extremity Assessment: Generalized weakness    Cervical / Trunk Assessment Cervical / Trunk Assessment: Kyphotic  Communication   Communication: No difficulties  Cognition Arousal/Alertness: Suspect due to medications (Very sleepy this session ) Behavior During Therapy: WFL for tasks assessed/performed Overall Cognitive Status: History of cognitive impairments - at baseline                                 General Comments: Pt very sleepy. Has PMH of dementia and states increase in forgetfullness.       General Comments General comments (skin integrity, edema, etc.): RN cleared for participation. Discussed importance of having someone at home with her upon d/c to increase safety. Pt reports her grandson just got home from college, so her daughter may not be able to, but she has a gentleman friend that could. Unsure of ability to provide 24/7 assist secondary to no family present.     Exercises General Exercises - Lower Extremity Ankle Circles/Pumps: AROM;10 reps;Seated   Assessment/Plan    PT Assessment Patient needs continued PT services  PT Problem List Decreased strength;Decreased activity tolerance;Decreased balance;Decreased mobility;Decreased cognition;Decreased knowledge of use of DME;Decreased knowledge of precautions;Pain       PT Treatment Interventions DME instruction;Gait training;Stair training;Functional mobility training;Balance training;Therapeutic exercise;Therapeutic activities;Neuromuscular re-education;Patient/family education;Cognitive remediation    PT Goals (Current goals can be found in the Care Plan section)  Acute Rehab PT Goals Patient Stated Goal: to go home  PT Goal Formulation: With patient Time For Goal Achievement: 04/16/17 Potential to Achieve Goals: Good    Frequency Min 3X/week    Barriers to discharge Decreased caregiver support Lives alone, and only has aide 1 day/wk    Co-evaluation               AM-PAC PT "6 Clicks" Daily Activity  Outcome Measure Difficulty turning over in bed (including adjusting bedclothes, sheets and blankets)?: A Lot Difficulty moving from lying on back to sitting on the side of the bed? : A Lot Difficulty sitting down on and standing up from a chair with arms (e.g., wheelchair, bedside commode, etc,.)?: Total Help needed moving to and from a bed to chair (including a wheelchair)?: A Little Help needed walking in hospital room?: A Little Help needed climbing 3-5 steps with a railing? : A Lot 6 Click Score: 13    End of Session Equipment Utilized During Treatment: Gait belt Activity Tolerance: Patient limited by lethargy Patient left: in chair;with call bell/phone within reach Nurse Communication: Mobility status PT Visit Diagnosis: Unsteadiness on feet (R26.81);Muscle weakness (generalized) (M62.81)    Time: 0254-2706 PT Time Calculation (min) (ACUTE ONLY): 38 min   Charges:   PT Evaluation $PT Eval Moderate Complexity: 1 Procedure PT Treatments $Gait Training: 8-22 mins   PT G Codes:   PT G-Codes **NOT FOR INPATIENT CLASS** Functional Assessment Tool Used: AM-PAC 6 Clicks Basic Mobility Functional Limitation: Mobility: Walking and moving around Mobility: Walking and Moving Around Current Status (C3762): At least 40 percent but less than 60 percent impaired, limited  or restricted Mobility: Walking and Moving Around Goal Status [phone]): At least 1 percent but less than 20 percent impaired, limited or restricted    Leighton Ruff, PT, DPT  Acute Rehabilitation Services  Pager: [phone]   Rudean Hitt 04/09/2017, 2:21 PM

## 2017-04-09 NOTE — Progress Notes (Signed)
  Progress Note    04/09/2017 11:26 AM 1 Day Post-Op  Subjective:  Having right groin pain  Vitals:   04/09/17 0741 04/09/17 1126  BP: 132/62 130/60  Pulse: 78 77  Resp: 16 15  Temp: 98 F (36.7 C) 98.1 F (36.7 C)    Physical Exam: Awake and alert Non labored respirations Abdomen is soft Hematoma present in right groin Right pt signal, left dp  CBC    Component Value Date/Time   WBC 7.4 04/09/2017 0227   RBC 3.32 (L) 04/09/2017 0227   HGB 10.7 (L) 04/09/2017 0227   HCT 33.6 (L) 04/09/2017 0227   PLT 163 04/09/2017 0227   MCV 101.2 (H) 04/09/2017 0227   MCH 32.2 04/09/2017 0227   MCHC 31.8 04/09/2017 0227   RDW 13.8 04/09/2017 0227   LYMPHSABS 1.0 09/08/2016 0500   MONOABS 1.0 09/08/2016 0500   EOSABS 0.2 09/08/2016 0500   BASOSABS 0.0 09/08/2016 0500    BMET    Component Value Date/Time   NA 138 04/09/2017 0227   K 3.8 04/09/2017 0227   CL 106 04/09/2017 0227   CO2 25 04/09/2017 0227   GLUCOSE 153 (H) 04/09/2017 0227   BUN 21 (H) 04/09/2017 0227   CREATININE 0.94 04/09/2017 0227   CALCIUM 8.7 (L) 04/09/2017 0227   GFRNONAA 55 (L) 04/09/2017 0227   GFRAA >60 04/09/2017 0227    INR    Component Value Date/Time   INR 1.11 09/28/2015 1315     Intake/Output Summary (Last 24 hours) at 04/09/17 1126 Last data filed at 04/09/17 0008  Gross per 24 hour  Intake            48.96 ml  Output              600 ml  Net          -551.04 ml     Assessment:  83 y.o. female is s/p stenting of left popliteal artery complicated by groin hematoma that is stable with stable h/h  Plan: Discharge today Resume eliquis tomorrow 81mg  aspirin F/u with Dr. Donnetta Hutching 4 weeks with duplex/abi   [name] [name] C. Donzetta Matters, MD [facility] Office: [phone] Pager: [phone]  04/09/2017 11:26 AM

## 2017-04-09 NOTE — Telephone Encounter (Signed)
-----   Message from Mena Goes, RN sent at 04/08/2017  4:08 PM EDT ----- Regarding: 4 weeks    ----- Message ----- From: Waynetta Sandy, MD Sent: 04/08/2017   3:38 PM To: [address]  [name] [name] [id number] 07-30-1934  04/08/2017 Pre-operative Diagnosis: critical left lower extremity ischemia  Surgeon:  Eda Paschal. Donzetta Matters, MD  Procedure Performed: 1.  US guided cannulation of right common femoral artery 2.  Stent of left popliteal artery with 5 x 142mm viabahn 3.  Moderate sedation with fentanyl and versed for 91 minutes  F/u in 4 weeks with Dr. Donnetta Hutching with left lower extremity duplex and ABI

## 2017-04-10 LAB — GLUCOSE, CAPILLARY: Glucose-Capillary: 202 mg/dL — ABNORMAL HIGH (ref 65–99)

## 2017-04-20 ENCOUNTER — Encounter (HOSPITAL_BASED_OUTPATIENT_CLINIC_OR_DEPARTMENT_OTHER): Payer: Medicare Other | Attending: Internal Medicine

## 2017-04-20 DIAGNOSIS — I2510 Atherosclerotic heart disease of native coronary artery without angina pectoris: Secondary | ICD-10-CM | POA: Insufficient documentation

## 2017-04-20 DIAGNOSIS — E1140 Type 2 diabetes mellitus with diabetic neuropathy, unspecified: Secondary | ICD-10-CM | POA: Diagnosis not present

## 2017-04-20 DIAGNOSIS — E1151 Type 2 diabetes mellitus with diabetic peripheral angiopathy without gangrene: Secondary | ICD-10-CM | POA: Diagnosis not present

## 2017-04-20 DIAGNOSIS — E11621 Type 2 diabetes mellitus with foot ulcer: Secondary | ICD-10-CM | POA: Diagnosis present

## 2017-04-20 DIAGNOSIS — Z95820 Peripheral vascular angioplasty status with implants and grafts: Secondary | ICD-10-CM | POA: Diagnosis not present

## 2017-04-20 DIAGNOSIS — I10 Essential (primary) hypertension: Secondary | ICD-10-CM | POA: Insufficient documentation

## 2017-04-20 DIAGNOSIS — L97522 Non-pressure chronic ulcer of other part of left foot with fat layer exposed: Secondary | ICD-10-CM | POA: Insufficient documentation

## 2017-04-20 DIAGNOSIS — F0390 Unspecified dementia without behavioral disturbance: Secondary | ICD-10-CM | POA: Diagnosis not present

## 2017-04-27 DIAGNOSIS — E11621 Type 2 diabetes mellitus with foot ulcer: Secondary | ICD-10-CM | POA: Diagnosis not present

## 2017-04-29 ENCOUNTER — Ambulatory Visit (INDEPENDENT_AMBULATORY_CARE_PROVIDER_SITE_OTHER): Payer: Medicare Other | Admitting: Ophthalmology

## 2017-04-29 DIAGNOSIS — H35033 Hypertensive retinopathy, bilateral: Secondary | ICD-10-CM

## 2017-04-29 DIAGNOSIS — E11319 Type 2 diabetes mellitus with unspecified diabetic retinopathy without macular edema: Secondary | ICD-10-CM

## 2017-04-29 DIAGNOSIS — H338 Other retinal detachments: Secondary | ICD-10-CM | POA: Diagnosis not present

## 2017-04-29 DIAGNOSIS — I10 Essential (primary) hypertension: Secondary | ICD-10-CM | POA: Diagnosis not present

## 2017-04-29 DIAGNOSIS — E113593 Type 2 diabetes mellitus with proliferative diabetic retinopathy without macular edema, bilateral: Secondary | ICD-10-CM | POA: Diagnosis not present

## 2017-04-29 DIAGNOSIS — H43813 Vitreous degeneration, bilateral: Secondary | ICD-10-CM

## 2017-05-04 DIAGNOSIS — E11621 Type 2 diabetes mellitus with foot ulcer: Secondary | ICD-10-CM | POA: Diagnosis not present

## 2017-05-05 ENCOUNTER — Ambulatory Visit: Payer: Medicare Other | Admitting: Podiatry

## 2017-05-07 NOTE — Progress Notes (Signed)
Cardiology Office Note   Date:  05/08/2017   ID:  [name] [name], DOB Dec 08, 1933, MRN [id number]  PCP:  [name] Manes, MD    Chief Complaint  Patient presents with  . Follow-up  CAD   Wt Readings from Last 3 Encounters:  05/08/17 179 lb (81.2 kg)  04/08/17 180 lb (81.6 kg)  03/31/17 175 lb (79.4 kg)       History of Present Illness: [name] [name] is a 83 y.o. female  who is status post CABG in 05/8920 complicated by postoperative atrial fibrillation, HTN, HL, diabetes, GERD, carotid stenosis, PAD status post angioplasty to the left SFA in 02/2012 for non healing foot ulcer, dementia.  She had a negative stress test in late 2015.  She follows with Dr. Donnetta Hutching for carotid disease.  She had an arteriogram showing an occluded left SFA.  She had a covered stent to the left popliteal artery.  Denies : Chest pain. Dizziness.  Nitroglycerin use. Orthopnea. Palpitations. Paroxysmal nocturnal dyspnea. Shortness of breath. Syncope.   She reports mild leg swelling.  She feels that she gets fatigued.  She is not doing any regular exercise.  She walks in the house a lot and sometimes gets outdoors and walks in the driveway.  She does not have access to a stationary bike.    She reports incontinence as well.   She has been diagnosed with Alzheimers dementia.     Past Medical History:  Diagnosis Date  . Anemia   . Anxiety   . Arthritis    "right wrist, lower back, maybe my right shoulder" (04/08/2017)  . Atrial fibrillation ([place])   . Bilateral carotid artery stenosis   . Bowel incontinence   . CAD (coronary artery disease)    a. s/p CABG 2012.  [name] Chronic lower back pain    "back is very stiff because of fusion; can't stand up straight more than few moments"  . Closed fracture of unspecified part of neck of femur   . Complication of anesthesia    anesth. meds caused hallucinations   . Cough    no fever, chronic  . Degenerative joint disease   . Dementia   . Depression     . Diverticulitis   . Endometriosis   . Fall at home 05/15/2013  . GERD (gastroesophageal reflux disease)   . Headache   . Hiatal hernia   . History of blood transfusion   . History of wheezing   . HLD (hyperlipidemia)   . Hx of cardiovascular stress test    Lexiscan Myoview (11/15):  Low risk stress nuclear study with a small, mild fixed apical septal perfusion defect. EF is 52% with mild septal hypokinesis. No ischemia.  [name] Hx of echocardiogram    Echo (11/15):  Mod LVH, severe septal hypertrophy (suggestive HCM), no LVOT obstruction, rest gradient 15 mmHg across AV, vigorous LVF, EF 65-70%, no RWMA, Gr 1 DD, Ao sclerosis (no stenosis), mod LAE, mild RAE, mod TR, PASP 42 mmHg  . Hypertension   . Hypertonicity of bladder   . Hypertrophic cardiomyopathy ([place])   . Hypothyroidism   . Lumbago   . Muscle weakness (generalized)   . Obesity   . Osteoarthrosis, unspecified whether generalized or localized, unspecified site   . Progressive dementia with uncertain etiology 1/94/1740   Suspect Lewy body .  [name] Reflux esophagitis   . Stroke [facility]) 2011   "old very small TIA/neurologist"  . Type II diabetes mellitus ([place])   .  Ulcer    left foot x 2 years sees dr Little Ishikawa podiastrist for wound staying same tiny wart appearing areas no drainage changes dressing every 3 days  . Unspecified glaucoma(365.9)   . Urinary incontinence     Past Surgical History:  Procedure Laterality Date  . ABDOMINAL AORTOGRAM W/LOWER EXTREMITY N/A 04/08/2017   Procedure: Abdominal Aortogram w/Lower Extremity;  Surgeon: Waynetta Sandy, MD;  Location: [place] [facility];  Service: Cardiovascular;  Laterality: N/A;  . BACK SURGERY     X2 lower  . BOTOX INJECTION N/A 12/02/2016   Procedure: BOTOX INJECTION;  Surgeon: Bjorn Loser, MD;  Location: [facility];  Service: Urology;  Laterality: N/A;  . CARDIAC CATHETERIZATION    . CARPAL TUNNEL RELEASE Bilateral   . CATARACT EXTRACTION W/ INTRAOCULAR LENS   IMPLANT, BILATERAL Bilateral   . CHOLECYSTECTOMY    . CORONARY ARTERY BYPASS GRAFT  04/17/2011   LT  internal mammary artery to left anterior descending, saphenous vein graft first diagonal, saphenous  vein graft to obtuse marginal 1, saphenous vein graft to posterior descending  . CYSTOSCOPY N/A 12/02/2016   Procedure: CYSTOSCOPY;  Surgeon: Bjorn Loser, MD;  Location: [facility];  Service: Urology;  Laterality: N/A;  . DILATION AND CURETTAGE OF UTERUS     "for endometrosis"  . DILATION AND CURETTAGE OF UTERUS     ENDOMETRIOSIS  . ENDARTERECTOMY Left 10/01/2015   Procedure: Left CAROTID Endartarectomy;  Surgeon: Rosetta Posner, MD;  Location: [place];  Service: Vascular;  Laterality: Left;  . EYE SURGERY    . HAND SURGERY Right    "for arthritis;"  . JOINT REPLACEMENT    . LASER PHOTO ABLATION Right 01/20/2013   Procedure: LASER PHOTO ABLATION;  Surgeon: Hayden Pedro, MD;  Location: [place];  Service: Ophthalmology;  Laterality: Right;  . LOWER EXTREMITY ANGIOGRAM N/A 03/10/2012   Procedure: LOWER EXTREMITY ANGIOGRAM;  Surgeon: Jettie Booze, MD;  Location: [facility] C[facility];  Service: Cardiovascular;  Laterality: N/A;  . NEUROPLASTY / TRANSPOSITION MEDIAN NERVE AT CARPAL TUNNEL BILATERAL    . PARTIAL HIP ARTHROPLASTY Left 11/2009   "just the ball"  . PERIPHERAL VASCULAR CATHETERIZATION N/A 05/16/2015   Procedure: Abdominal Aortogram;  Surgeon: Elam Dutch, MD;  Location: [place] [facility];  Service: Cardiovascular;  Laterality: N/A;  . PERIPHERAL VASCULAR CATHETERIZATION Left 05/16/2015   Procedure: Lower Extremity Angiography;  Surgeon: Elam Dutch, MD;  Location: [place] [facility];  Service: Cardiovascular;  Laterality: Left;  . PERIPHERAL VASCULAR INTERVENTION  04/08/2017   Procedure: Peripheral Vascular Intervention;  Surgeon: Waynetta Sandy, MD;  Location: [facility] [facility];  Service: Cardiovascular;;  lt.Popliteal  . POSTERIOR FUSION LUMBAR SPINE     "removed L4-5,  S1"  . PTCA  03/10/12   LLE  . RETINAL DETACHMENT SURGERY Right May 2014  . SCLERAL BUCKLE Right 01/20/2013   Procedure: SCLERAL BUCKLE;  Surgeon: Hayden Pedro, MD;  Location: [place];  Service: Ophthalmology;  Laterality: Right;  . TOTAL KNEE ARTHROPLASTY  ~ 2004   right  . TOTAL SHOULDER ARTHROPLASTY Right 10/07/2013   Procedure: TOTAL SHOULDER ARTHROPLASTY;  Surgeon: Alta Corning, MD;  Location: [facility];  Service: Orthopedics;  Laterality: Right;     Current Outpatient Prescriptions  Medication Sig Dispense Refill  . acetaminophen (TYLENOL) 650 MG CR tablet Take 650 mg by mouth every 8 (eight) hours as needed for pain.    [name] apixaban (ELIQUIS) 5 MG TABS tablet Take  5 mg by mouth daily.    . Ascorbic Acid (VITAMIN C) 1000 MG tablet Take 1,000 mg by mouth 3 (three) times a week.     [name] aspirin EC 81 MG EC tablet Take 1 tablet (81 mg total) by mouth daily.    . cholecalciferol (VITAMIN D) 1000 UNITS tablet Take 1,000 Units by mouth See admin instructions. On Monday, Tuesday, Wednesday, Friday, Saturday.    . COMBIGAN 0.2-0.5 % ophthalmic solution Place 1-2 drops into both eyes daily.   0  . donepezil (ARICEPT) 5 MG tablet Take 5 mg by mouth at bedtime.    . ergocalciferol (VITAMIN D2) 50000 UNITS capsule Take 50,000 Units by mouth 2 (two) times a week. On Thursday and Sunday    . felodipine (PLENDIL) 10 MG 24 hr tablet Take 10 mg by mouth daily.    [name] FLUoxetine (PROZAC) 20 MG capsule Take 20 mg by mouth every morning.     [name] glipiZIDE (GLUCOTROL XL) 5 MG 24 hr tablet Take 5 mg by mouth at bedtime.     . Insulin Glargine (LANTUS SOLOSTAR) 100 UNIT/ML SOPN Inject 10 Units into the skin at bedtime.     [name] JANUVIA 100 MG tablet Take 100 mg by mouth every morning.     [name] levothyroxine (SYNTHROID, LEVOTHROID) 125 MCG tablet Take 125 mcg by mouth daily before breakfast.     . losartan (COZAAR) 25 MG tablet Take 25 mg by mouth daily.   0  . memantine (NAMENDA) 10 MG tablet Take 1 tablet (10 mg total) by  mouth 2 (two) times daily. 180 tablet 3  . metoprolol tartrate (LOPRESSOR) 25 MG tablet Take 25 mg by mouth 2 (two) times daily.      . mirabegron ER (MYRBETRIQ) 50 MG TB24 Take 50 mg by mouth every evening.    . pioglitazone (ACTOS) 30 MG tablet Take 30 mg by mouth every morning.     . pravastatin (PRAVACHOL) 80 MG tablet take 1 tablet by mouth once daily (Patient taking differently: take 80 mg by mouth once daily) 90 tablet 3  . prednisoLONE sodium phosphate (INFLAMASE FORTE) 1 % ophthalmic solution Place 1 drop into the right eye 3 (three) times daily.    . QUEtiapine (SEROQUEL) 25 MG tablet Take 1 tablet (25 mg total) by mouth at bedtime. 30 tablet 5  . silver sulfADIAZINE (SILVADENE) 1 % cream Apply 1 application topically daily. 50 g 0   No current facility-administered medications for this visit.     Allergies:   Amitriptyline; Vesicare [solifenacin]; Vicodin [hydrocodone-acetaminophen]; and Polysaccharide iron complex    Social History:  The patient  reports that she quit smoking about 29 years ago. Her smoking use included Cigarettes. She has a 57.00 pack-year smoking history. She has never used smokeless tobacco. She reports that she does not drink alcohol or use drugs.   Family History:  The patient's family history includes Cancer in her cousin and paternal aunt; Heart attack in her father and mother; Heart disease in her father; Hyperlipidemia in her father; Hypertension in her father and mother.    ROS:  Please see the history of present illness.   Otherwise, review of systems are positive for incontinence.   All other systems are reviewed and negative.    PHYSICAL EXAM: VS:  BP (!) 144/50   Pulse 74   Ht 5' (1.524 m)   Wt 179 lb (81.2 kg)   BMI 34.96 kg/m  , BMI Body mass index is  34.96 kg/m. GEN: Well nourished, well developed, in no acute distress  HEENT: normal  Neck: no JVD, carotid bruits, or masses Cardiac: irregularly irregular; no murmurs, rubs, or gallops,no  edema  Respiratory:  clear to auscultation bilaterally, normal work of breathing GI: soft, nontender, nondistended, + BS MS: no deformity or atrophy  Skin: warm and dry, no rash Neuro:  Strength and sensation are intact Psych: euthymic mood, full affect   EKG:   The ekg ordered today demonstrates AFib, rate control   Recent Labs: 04/09/2017: BUN 21; Creatinine, Ser 0.94; Hemoglobin 10.7; Platelets 163; Potassium 3.8; Sodium 138   Lipid Panel    Component Value Date/Time   CHOL 157 06/26/2014 1132   TRIG 250.0 (H) 06/26/2014 1132   HDL 30.80 (L) 06/26/2014 1132   CHOLHDL 5 06/26/2014 1132   VLDL 50.0 (H) 06/26/2014 1132   LDLCALC 123 (H) 03/20/2014 1107   LDLDIRECT 73.3 06/26/2014 1132     Other studies Reviewed: Additional studies/ records that were reviewed today with results demonstrating: .   ASSESSMENT AND PLAN:  1. CAD: No angina on current medical therapy.  Continue aggressive secondary prevention. 2. AFib: She should be on Eliquis 5 mg BID for stroke prevention.  Will call in new prescription. 3. PAD:  Followed with Dr. Donnetta Hutching. S/p recent popliteal artery stent. 4. HTN: COntrolled.  COntinue current meds. 5. Hyperlipidemia: LDL 86. Continue pravastatin. 6. DM: COntinue meds.  Followed by Dr. Felipa Eth.  A1C 6.8.    Current medicines are reviewed at length with the patient today.  The patient concerns regarding her medicines were addressed.  The following changes have been made:  No change  Labs/ tests ordered today include:  No orders of the defined types were placed in this encounter.   Recommend 150 minutes/week of aerobic exercise Low fat, low carb, high fiber diet recommended  Disposition:   FU in 1 year   Signed, Larae Grooms, MD  05/08/2017 11:49 AM    [facility] [place], [place], [place]  [address] Phone: [phone]; Fax: [phone]

## 2017-05-08 ENCOUNTER — Other Ambulatory Visit: Payer: Self-pay

## 2017-05-08 ENCOUNTER — Encounter: Payer: Self-pay | Admitting: Interventional Cardiology

## 2017-05-08 ENCOUNTER — Ambulatory Visit (INDEPENDENT_AMBULATORY_CARE_PROVIDER_SITE_OTHER): Payer: Medicare Other | Admitting: Interventional Cardiology

## 2017-05-08 VITALS — BP 144/50 | HR 74 | Ht 60.0 in | Wt 179.0 lb

## 2017-05-08 DIAGNOSIS — E118 Type 2 diabetes mellitus with unspecified complications: Secondary | ICD-10-CM | POA: Diagnosis not present

## 2017-05-08 DIAGNOSIS — I739 Peripheral vascular disease, unspecified: Secondary | ICD-10-CM

## 2017-05-08 DIAGNOSIS — I2510 Atherosclerotic heart disease of native coronary artery without angina pectoris: Secondary | ICD-10-CM

## 2017-05-08 DIAGNOSIS — I10 Essential (primary) hypertension: Secondary | ICD-10-CM

## 2017-05-08 DIAGNOSIS — I481 Persistent atrial fibrillation: Secondary | ICD-10-CM | POA: Diagnosis not present

## 2017-05-08 DIAGNOSIS — E782 Mixed hyperlipidemia: Secondary | ICD-10-CM | POA: Diagnosis not present

## 2017-05-08 DIAGNOSIS — I4819 Other persistent atrial fibrillation: Secondary | ICD-10-CM

## 2017-05-08 MED ORDER — APIXABAN 5 MG PO TABS: 5.0000 mg | ORAL_TABLET | Freq: Two times a day (BID) | ORAL | 11 refills | Status: DC

## 2017-05-08 NOTE — Patient Instructions (Signed)
Medication Instructions:  You need to be taking Eliquis 5 mg TWICE A DAY   Labwork: None ordered  Testing/Procedures: None ordered  Follow-Up: Your physician wants you to follow-up in: 1 year with Dr. Irish Lack. You will receive a reminder letter in the mail two months in advance. If you don't receive a letter, please call our office to schedule the follow-up appointment.   Any Other Special Instructions Will Be Listed Below (If Applicable).     If you need a refill on your cardiac medications before your next appointment, please call your pharmacy.

## 2017-05-11 DIAGNOSIS — E11621 Type 2 diabetes mellitus with foot ulcer: Secondary | ICD-10-CM | POA: Diagnosis not present

## 2017-05-13 ENCOUNTER — Ambulatory Visit (INDEPENDENT_AMBULATORY_CARE_PROVIDER_SITE_OTHER): Payer: Medicare Other | Admitting: Neurology

## 2017-05-13 ENCOUNTER — Ambulatory Visit: Payer: Medicare Other | Admitting: Podiatry

## 2017-05-13 ENCOUNTER — Encounter: Payer: Self-pay | Admitting: Neurology

## 2017-05-13 VITALS — BP 153/70 | HR 70 | Ht 60.0 in | Wt 185.0 lb

## 2017-05-13 DIAGNOSIS — F0280 Dementia in other diseases classified elsewhere without behavioral disturbance: Secondary | ICD-10-CM | POA: Diagnosis not present

## 2017-05-13 DIAGNOSIS — G3183 Dementia with Lewy bodies: Secondary | ICD-10-CM | POA: Diagnosis not present

## 2017-05-13 MED ORDER — RIVASTIGMINE 4.6 MG/24HR TD PT24: 4.6000 mg | MEDICATED_PATCH | Freq: Every day | TRANSDERMAL | 12 refills | Status: DC

## 2017-05-13 NOTE — Progress Notes (Signed)
[facility]  Provider:  Larey Seat, M D  Referring Provider: Lajean Manes, MD Primary Care Physician:  Lajean Manes, MD      05-13-2017 The patient had developed some pressure ulcers on the left foot, she was found to have poor circulation, a stent was placed at the femoral artery, and she now expects wound healing to progress quicker. In the time since May last visit with her left been some safety features implemented, she is not walking without a walker. She still has physical therapy in the home now, and had a home safety inspection. Some furniture had to be moved and some rugs were removed, and she also got grab bars in her bathroom. Today's memory test was a Mini-Mental Status Examination and she scored 24 out of 30 points. The patient has noted by herself that she is more memory impaired, she is not always sure if home health or physical therapy has already visited her or not, it may be helpful for them to sign a paper and leave it in the room with a date for that she can later on the same day remind herself. They may just sign onto her visibly posted calendar. She has no new hallucinations. She has parkinsonian gait features.     MMSE - Mini Mental State Exam 05/13/2017 05/13/2017 01/22/2017 08/19/2016 02/12/2016 08/13/2015  Orientation to time 4 4 5 4 4 4   Orientation to Place 5 5 5 4 4 5   Registration 3 3 3 3 3 3   Attention/ Calculation 2 - 2 5 1 4   Recall 2 - 1 1 1 1   Language- name 2 objects 2 - 2 2 2 2   Language- repeat 1 - 0 1 0 1  Language- follow 3 step command 3 - 3 3 3 3   Language- read & follow direction 1 - 1 1 1 1   Write a sentence 1 - 1 1 1 1   Copy design 0 - 0 1 0 1  Total score 24 - 23 26 20 26          HPI: 01-22-2017,  Mrs. Dicarlo has been an established patient since 2011 in the [facility] practice. She was initially referred here for mild memory loss and hallucinations.  The hallucinations began after a hip replacement in March 2011, after  which she moved to rehabilitation at [facility]. She had experienced a noticeable difference in her handwriting and a left facial droop but a CT scan was negative for stroke. In August 2013 she left [employer],  and then returned home and now again lives alone.  Her Mini-Mental Status Examination was 29 of 30 points or more in 2013 ,  Fall risk was 10 points in 2013 .  In December 2013  return for follow up with another  MMSE., which documented 26/30 , followed by [facility] testing at  16/30 points.  I repeated the [place]  at 13/30 points.  Her fall risk was  increased to 15 points, GDS (geriatric depression score) endorsed at 5 points. The patient underwent another eye surgery on May 8th of this year. She had decreasing visual acuity , her right eye remained red, puffy  and it burned. She has gotten confused when walking to the local bank, has been getting lost on familiar roads and she is not longer permitted to drive.  The 2011  hallucinations were described as " a strong sense that a snake was in the room " ,that there were snakes in her  house-  she also has heard a hissing  sounds and rales and feared that the snake was in her room.. She called the police feel that somebody would break into a home,13 times- and as she recalled did not get a warm response. This was likely related to the use of pain medication, inducing hallucinations. With the beginning of treatment with Seroquel , her  hallucinations resolved. She still reports vivid and at  times crazy dreams. REM BD.  There is no documented nocturnal activity for the last 12 months, but the patient also lives along and has no witnesses. Her occupational and physical therapists have had visits after she fell Christmas 2017, changed furniture and removed rugs.  She fell on 01-20-2017 after midnight, trying to manage waling through a dark hallway. She couldn't reach the light. She fell and injured her left chest /ribcage, and fell into her walker, it  may have gotten cought in furniture. She was recently seen by Dr. Felipa Eth and found the diagnosis of alzheimer's listed, which she had never heard from me. We discuss safety and diagnosis with her daughter present.      CM - visit  11- 2015 .This patient had undergone a shoulder replacement in January 2015 and went again to Rehab at [facility] for a 2 month period, followed by out patient therapy until last week.  [name] has followed her for eye-care, and she reported her right eye remained irritated and itching. Her eyes are very dry. She has used Restasis for several years, but stopped because of the price. She is on Lumigan eye drops still. She is conversant and pleasant. The patient was not longer aware that she had gotten lost several times. The daughter reminded her that these events truly took place. She returned home and in September was seen by her cardiologist for a possible TIA , numbness of the left arm- she presented to urgent care first ( Dr. Michail Sermon is PCP) . In the past she had a transient left facial droop. She had carotid doppler study with Dr. Irish Lack in September, those were less than 70% , Dr Early agreed and  suggested q 6 month follow up , left  Carotid stenosis of 65% and 45 % on the right.  She has completely recovered. Given her chronic a fib on ASA, I would not like to add any anticoagulation- she is at high fall risk.  MOCA today 08-02-14 at only 21 -30 points, not confused, but more delayed in her responses.    Review of Systems: Out of a complete 14 system review, the patient complains of only the following symptoms, and all other reviewed systems are negative.    Vision loss, fall risk high, does not walk today , but is seated in a wheelchair.   Memory loss, high degree of fatigue, vision is limited, burning pain in the right eye,  had retinal detachment. She has bowel and urinary incontinence for 30 month . No recent hallucinations. On anticoagulation.    Current Outpatient Prescriptions  Medication Sig Dispense Refill  . acetaminophen (TYLENOL) 650 MG CR tablet Take 650 mg by mouth every 8 (eight) hours as needed for pain.    [name] apixaban (ELIQUIS) 5 MG TABS tablet Take 1 tablet (5 mg total) by mouth 2 (two) times daily. 60 tablet 11  . Ascorbic Acid (VITAMIN C) 1000 MG tablet Take 1,000 mg by mouth 3 (three) times a week.     [name] aspirin EC 81 MG EC tablet  Take 1 tablet (81 mg total) by mouth daily.    . cholecalciferol (VITAMIN D) 1000 UNITS tablet Take 1,000 Units by mouth See admin instructions. On Monday, Tuesday, Wednesday, Friday, Saturday.    . COMBIGAN 0.2-0.5 % ophthalmic solution Place 1-2 drops into both eyes daily.   0  . donepezil (ARICEPT) 5 MG tablet Take 5 mg by mouth at bedtime.    . ergocalciferol (VITAMIN D2) 50000 UNITS capsule Take 50,000 Units by mouth 2 (two) times a week. On Thursday and Sunday    . felodipine (PLENDIL) 10 MG 24 hr tablet Take 10 mg by mouth daily.    [name] FLUoxetine (PROZAC) 20 MG capsule Take 20 mg by mouth every morning.     [name] glipiZIDE (GLUCOTROL XL) 5 MG 24 hr tablet Take 5 mg by mouth at bedtime.     . Insulin Glargine (LANTUS SOLOSTAR) 100 UNIT/ML SOPN Inject 10 Units into the skin at bedtime.     [name] JANUVIA 100 MG tablet Take 100 mg by mouth every morning.     [name] levothyroxine (SYNTHROID, LEVOTHROID) 125 MCG tablet Take 125 mcg by mouth daily before breakfast.     . losartan (COZAAR) 25 MG tablet Take 25 mg by mouth daily.   0  . memantine (NAMENDA) 10 MG tablet Take 1 tablet (10 mg total) by mouth 2 (two) times daily. 180 tablet 3  . metoprolol tartrate (LOPRESSOR) 25 MG tablet Take 25 mg by mouth 2 (two) times daily.      . mirabegron ER (MYRBETRIQ) 50 MG TB24 Take 50 mg by mouth every evening.    . pioglitazone (ACTOS) 30 MG tablet Take 30 mg by mouth every morning.     . pravastatin (PRAVACHOL) 80 MG tablet take 1 tablet by mouth once daily (Patient taking differently: take 80 mg by mouth once  daily) 90 tablet 3  . prednisoLONE sodium phosphate (INFLAMASE FORTE) 1 % ophthalmic solution Place 1 drop into the right eye 3 (three) times daily.    . QUEtiapine (SEROQUEL) 25 MG tablet Take 1 tablet (25 mg total) by mouth at bedtime. 30 tablet 5  . silver sulfADIAZINE (SILVADENE) 1 % cream Apply 1 application topically daily. 50 g 0   No current facility-administered medications for this visit.     Allergies as of 05/13/2017 - Review Complete 05/13/2017  Allergen Reaction Noted  . Amitriptyline Other (See Comments) 09/27/2013  . Vesicare [solifenacin] Other (See Comments) 09/27/2013  . Vicodin [hydrocodone-acetaminophen] Other (See Comments) 05/09/2011  . Polysaccharide iron complex Hives, Itching, and Rash 05/12/2011    Vitals: BP (!) 153/70   Pulse 70   Ht 5' (1.524 m)   Wt 185 lb (83.9 kg)   BMI 36.13 kg/m  Last Weight:  Wt Readings from Last 1 Encounters:  05/13/17 185 lb (83.9 kg)   Last Height:   Ht Readings from Last 1 Encounters:  05/13/17 5' (1.524 m)    Physical exam:  General: The patient is awake, alert and appears not in acute distress. The patient is well groomed. Head: Normocephalic, atraumatic.  Cardiovascular:  irregular rate and rhythm Respiratory: Lungs are clear to auscultation. She does not provide deep respirations.  Skin:  Without evidence of edema, or rash   Neurologic exam : The patient is awake and alert, oriented to place and time.  Memory subjective described as declining , but not rapidly.   There is no longer a normal attention span & concentration ability- but fluent speech .  Speech is fluent without dysarthria,  stronger dysphonia  ( hoarseness ) and no  aphasia.  Mood and affect are concerned Cranial nerves: Pupils are equal and briskly reactive to light. Extraocular movements  in vertical and horizontal planes intact and without nystagmus. Hearing to finger rub intact. Facial sensation intact to fine touch.  Facial motor  strength is symmetric. Motor exam:   normal strength in upper extremities. Good grip strength. Fine motor skills preserved.  Gait and station: Patient in wheelchair.  Deep tendon reflexes: in the  upper and lower extremities are symmetric and intact.  Assessment:   1) Advancing memory los, inability to multitask, delay in motor responses and poor vision. The patient is not allowed to drive any longer.  Fell at night, denies that this was related to vivid dreams, but stumbled over a rug- REM BD ?  Early stage dementia, I lean towards levy body dementia  MMSE 24-30 , -not longer a "mild cognitive impairment".   Lewy body or frontal lobe dementia were suspected early , but she no longer has hallucinations of auditory character on Seroquel medication.  Discussed and repeated driving restriction - she should not even drive shopping or in her residential area. NO DRIVING.  Wheelchair prevents falls outdoors and longer distance, but she needs to get back on her feet with walker. PT is helpful.  Donapezil is causing diarrhea- will take it off.  we start excelon patch.  Rv with Np in 3 month to check MMSE off Aricept and eventually adjust the Exelon dose.    Larey Seat, MD

## 2017-05-13 NOTE — Patient Instructions (Signed)
Rivastigmine skin patches What is this medicine? RIVASTIGMINE (ri va STIG meen) is used to treat mild to moderate dementia caused by Parkinson's disease and mild to severe Alzheimer's disease. This medicine may be used for other purposes; ask your health care provider or pharmacist if you have questions. COMMON BRAND NAME(S): Exelon Patch What should I tell my health care provider before I take this medicine? They need to know if you have any of these conditions: -application site reaction during previous use of rivastigmine patch -heart disease -kidney disease -liver disease -lung or breathing disease, like asthma -seizures -slow, irregular heartbeat -stomach or intestine disease, ulcers, or stomach bleeding -trouble passing urine -an unusual or allergic reaction to rivastigmine, other medicines, foods, dyes, or preservatives -pregnant or trying to get pregnant -breast-feeding How should I use this medicine? This medicine is for external use only. Follow the directions on the prescription label. Always remove the old patch before you apply a new one. Apply to skin right after removing the protective liner. Do not cut or trim the patch. Apply to an area of the upper arm, chest, or back that is clean, dry and hairless. Avoid injured, irritated, oily, or calloused areas or where the patch will be rubbed by tight clothing or a waistband. Do not place over an area where lotion, cream, or powder was recently used. Press firmly in place until the edges stick well. To prevent skin irritiation, do not apply to the same place more than once every 14 days. Change your patch at the same time each day. Do not use more often than directed. Do not stop using except on your doctor's advice. Contact your pediatrician or health care professional regarding the use of this medicine in children. Special care may be needed. Overdosage: If you think you have taken too much of this medicine contact a poison control  center or emergency room at once. NOTE: This medicine is only for you. Do not share this medicine with others. What if I miss a dose? If you miss a dose, apply a new patch immediately. Then, apply the next patch at the usual time the next day after removing the previous patch. Do not apply 2 patches to make up for the missed one. If treatment is missed for 3 or more days, contact your healthcare provider for further instructions. What may interact with this medicine? -antihistamines for allergy, cough, and cold -atropine -certain medicines for bladder problems like oxybutynin, tolterodine -certain medicines for Parkinson's disease like benztropine, trihexyphenidyl -certain medicines for stomach problems like dicyclomine, hyoscyamine -glycopyrrolate -ipratropium -certain medicines for travel sickness like scopolamine -medicines that relax your muscles for surgery -other medicines for Alzheimer's disease This list may not describe all possible interactions. Give your health care provider a list of all the medicines, herbs, non-prescription drugs, or dietary supplements you use. Also tell them if you smoke, drink alcohol, or use illegal drugs. Some items may interact with your medicine. What should I watch for while using this medicine? Visit your doctor or health care professional for regular checks on your progress. Check with your doctor or health care professional if your symptoms do not start to get better or if they get worse. You may get drowsy or dizzy. Do not drive, use machinery, or do anything that needs mental alertness until you know how this drug affects you. Do not stand or sit up quickly, especially if you are an older patient. This reduces the risk of dizzy or fainting spells. Avoid saunas  and prolonged exposure to sunlight. You may bathe, swim, shower, or participate in any of your normal activities while wearing this patch. If the patch falls off, apply a new patch for the rest of  the day, then replace the patch the next day at the usual time. If you are going to have a magnetic resonance imaging (MRI) procedure, tell your MRI technician if you have this patch on your body. It must be removed before a MRI. What side effects may I notice from receiving this medicine? Side effects that you should report to your doctor or health care professional as soon as possible: -allergic reactions like skin rash, itching or hives, swelling of the face, lips, or tongue -application site reaction (such as skin redness, blisters, or swelling under or around the patch site) -changes in vision or balance -dizziness -feeling faint or lightheaded, falls -increase in frequency of passing urine or incontinence -nervousness, agitation, or increased confusion -redness, blistering, peeling or loosening of the skin, including inside the mouth -severe diarrhea -slow heartbeat or palpitations -stomach pain -sweating -uncontrollable movements -vomiting -weight loss Side effects that usually do not require medical attention (report to your doctor or health care professional if they continue or are bothersome): -headache -indigestion or heartburn -loss of appetite -mild diarrhea, especially when starting treatment -nausea This list may not describe all possible side effects. Call your doctor for medical advice about side effects. You may report side effects to FDA at 1-800-FDA-1088. Where should I keep my medicine? Keep out of the reach of children. Store at room temperature between 15 and 30 degrees C (59 and 86 degrees F). Store in original pouch until just prior to use. Throw away any unused medicine after the expiration date. Dispose of used patches properly. Since used patches may still contain active medicine, fold the patch in half so that it sticks to itself prior to disposal. NOTE: This sheet is a summary. It may not cover all possible information. If you have questions about this  medicine, talk to your doctor, pharmacist, or health care provider.  2018 Elsevier/Gold Standard (2015-04-27 15:39:13)

## 2017-05-14 NOTE — Addendum Note (Signed)
Addended by: Lianne Cure A on: 05/14/2017 02:14 PM   Modules accepted: Orders

## 2017-05-15 ENCOUNTER — Telehealth: Payer: Self-pay | Admitting: Neurology

## 2017-05-15 NOTE — Telephone Encounter (Signed)
The PA has been completed for pt. KEY [id number]. Can take up to 5 days for determination

## 2017-05-20 ENCOUNTER — Telehealth: Payer: Self-pay | Admitting: Neurology

## 2017-05-20 NOTE — Telephone Encounter (Signed)
Called and spoke with the daughter and explained that I had already sent the letter for appeal today. Explained that this process can take up to 30 days.

## 2017-05-20 NOTE — Telephone Encounter (Signed)
Medication was denied. Will start an letter for appeal

## 2017-05-20 NOTE — Telephone Encounter (Signed)
Pt daughter(on DPR) calling re: the rivastigmine (EXELON) 4.6 mg/24hr, she received a denial letter from [employer] and would like to know what Dr Brett Fairy would like to do at this point, please call

## 2017-05-25 ENCOUNTER — Encounter (HOSPITAL_BASED_OUTPATIENT_CLINIC_OR_DEPARTMENT_OTHER): Payer: Medicare Other | Attending: Internal Medicine

## 2017-05-25 DIAGNOSIS — E1142 Type 2 diabetes mellitus with diabetic polyneuropathy: Secondary | ICD-10-CM | POA: Diagnosis not present

## 2017-05-25 DIAGNOSIS — E1151 Type 2 diabetes mellitus with diabetic peripheral angiopathy without gangrene: Secondary | ICD-10-CM | POA: Insufficient documentation

## 2017-05-25 DIAGNOSIS — I2510 Atherosclerotic heart disease of native coronary artery without angina pectoris: Secondary | ICD-10-CM | POA: Diagnosis not present

## 2017-05-25 DIAGNOSIS — L97522 Non-pressure chronic ulcer of other part of left foot with fat layer exposed: Secondary | ICD-10-CM | POA: Diagnosis not present

## 2017-05-25 DIAGNOSIS — E11621 Type 2 diabetes mellitus with foot ulcer: Secondary | ICD-10-CM | POA: Insufficient documentation

## 2017-05-25 DIAGNOSIS — F0390 Unspecified dementia without behavioral disturbance: Secondary | ICD-10-CM | POA: Diagnosis not present

## 2017-05-25 DIAGNOSIS — I10 Essential (primary) hypertension: Secondary | ICD-10-CM | POA: Diagnosis not present

## 2017-05-25 NOTE — Telephone Encounter (Signed)
PA Approved for Rivastigmine patches. Patches were approved from May 15 2017 thru Sep 14 2017. PA number: [id number]

## 2017-05-26 ENCOUNTER — Encounter (HOSPITAL_COMMUNITY): Payer: Medicare Other

## 2017-06-02 ENCOUNTER — Encounter: Payer: Self-pay | Admitting: Vascular Surgery

## 2017-06-02 ENCOUNTER — Ambulatory Visit (INDEPENDENT_AMBULATORY_CARE_PROVIDER_SITE_OTHER)
Admission: RE | Admit: 2017-06-02 | Discharge: 2017-06-02 | Disposition: A | Discharge status: Home / Self Care | Payer: Medicare Other | Source: Ambulatory Visit | Attending: Vascular Surgery | Admitting: Vascular Surgery

## 2017-06-02 ENCOUNTER — Ambulatory Visit (HOSPITAL_COMMUNITY)
Admission: RE | Admit: 2017-06-02 | Discharge: 2017-06-02 | Disposition: A | Discharge status: Home / Self Care | Payer: Medicare Other | Source: Ambulatory Visit | Attending: Vascular Surgery | Admitting: Vascular Surgery

## 2017-06-02 ENCOUNTER — Ambulatory Visit (INDEPENDENT_AMBULATORY_CARE_PROVIDER_SITE_OTHER): Payer: Medicare Other | Admitting: Vascular Surgery

## 2017-06-02 VITALS — BP 154/68 | HR 69 | Temp 97.4°F | Resp 18 | Ht 60.0 in | Wt 178.0 lb

## 2017-06-02 DIAGNOSIS — I10 Essential (primary) hypertension: Secondary | ICD-10-CM | POA: Insufficient documentation

## 2017-06-02 DIAGNOSIS — I70229 Atherosclerosis of native arteries of extremities with rest pain, unspecified extremity: Secondary | ICD-10-CM

## 2017-06-02 DIAGNOSIS — I998 Other disorder of circulatory system: Secondary | ICD-10-CM

## 2017-06-02 DIAGNOSIS — I739 Peripheral vascular disease, unspecified: Secondary | ICD-10-CM

## 2017-06-02 DIAGNOSIS — Z95828 Presence of other vascular implants and grafts: Secondary | ICD-10-CM | POA: Insufficient documentation

## 2017-06-02 DIAGNOSIS — E1151 Type 2 diabetes mellitus with diabetic peripheral angiopathy without gangrene: Secondary | ICD-10-CM | POA: Insufficient documentation

## 2017-06-02 DIAGNOSIS — Z87891 Personal history of nicotine dependence: Secondary | ICD-10-CM | POA: Diagnosis not present

## 2017-06-02 NOTE — Progress Notes (Signed)
[facility]ist [facility] [place]  Patient name: [name] [name] MRN: [id number] DOB: 12-28-1933 Sex: female  REASON FOR VISIT: Follow-up recent popliteal covered stent on 04/08/2017  HPI: [name] [name] is a 83 y.o. female here today for follow-up. I'd seen her in the office with progressive nonhealing ulceration over the lateral aspect of her left foot. She has had prior vein harvest bilaterally for coronary artery bypass grafting. She underwent arteriography with Dr. Donzetta Matters on 04/08/2017 and was found to have a popliteal occlusion. She underwent a covered stenting of this and is here for follow-up or sooner she has had a near complete healing of the ulceration with a very slight superficial callus over this area and also over the tip of her toe. Location type symptoms.  Past Medical History:  Diagnosis Date  . Anemia   . Anxiety   . Arthritis    "right wrist, lower back, maybe my right shoulder" (04/08/2017)  . Atrial fibrillation ([place])   . Bilateral carotid artery stenosis   . Bowel incontinence   . CAD (coronary artery disease)    a. s/p CABG 2012.  [name] Chronic lower back pain    "back is very stiff because of fusion; can't stand up straight more than few moments"  . Closed fracture of unspecified part of neck of femur   . Complication of anesthesia    anesth. meds caused hallucinations   . Cough    no fever, chronic  . Degenerative joint disease   . Dementia   . Depression   . Diverticulitis   . Endometriosis   . Fall at home 05/15/2013  . GERD (gastroesophageal reflux disease)   . Headache   . Hiatal hernia   . History of blood transfusion   . History of wheezing   . HLD (hyperlipidemia)   . Hx of cardiovascular stress test    Lexiscan Myoview (11/15):  Low risk stress nuclear study with a small, mild fixed apical septal perfusion defect. EF is 52% with mild septal hypokinesis. No ischemia.  [name] Hx of echocardiogram    Echo  (11/15):  Mod LVH, severe septal hypertrophy (suggestive HCM), no LVOT obstruction, rest gradient 15 mmHg across AV, vigorous LVF, EF 65-70%, no RWMA, Gr 1 DD, Ao sclerosis (no stenosis), mod LAE, mild RAE, mod TR, PASP 42 mmHg  . Hypertension   . Hypertonicity of bladder   . Hypertrophic cardiomyopathy ([place])   . Hypothyroidism   . Lumbago   . Muscle weakness (generalized)   . Obesity   . Osteoarthrosis, unspecified whether generalized or localized, unspecified site   . Progressive dementia with uncertain etiology 6/38/7564   Suspect Lewy body .  [name] Reflux esophagitis   . Stroke [facility]) 2011   "old very small TIA/neurologist"  . Type II diabetes mellitus ([place])   . Ulcer    left foot x 2 years sees dr Little Ishikawa podiastrist for wound staying same tiny wart appearing areas no drainage changes dressing every 3 days  . Unspecified glaucoma(365.9)   . Urinary incontinence     Family History  Problem Relation Age of Onset  . Cancer Cousin   . Heart attack Mother   . Hypertension Mother   . Heart attack Father   . Heart disease Father        Before age 74  . Hyperlipidemia Father   . Hypertension Father   . Cancer Paternal Aunt     SOCIAL HISTORY: Social History  Substance Use  Topics  . Smoking status: Former Smoker    Packs/day: 1.50    Years: 38.00    Types: Cigarettes    Quit date: 09/16/1987  . Smokeless tobacco: Never Used  . Alcohol use No    Allergies  Allergen Reactions  . Amitriptyline Other (See Comments)    hallucinations  . Vesicare [Solifenacin] Other (See Comments)    hallucinations  . Vicodin [Hydrocodone-Acetaminophen] Other (See Comments)    Hallucinations - do NOT verify Vicodin orders  . Polysaccharide Iron Complex Hives, Itching and Rash    Patient can tolerate Ferrous Sulfate    Current Outpatient Prescriptions  Medication Sig Dispense Refill  . acetaminophen (TYLENOL) 650 MG CR tablet Take 650 mg by mouth every 8 (eight) hours as needed for pain.      [name] apixaban (ELIQUIS) 5 MG TABS tablet Take 1 tablet (5 mg total) by mouth 2 (two) times daily. 60 tablet 11  . Ascorbic Acid (VITAMIN C) 1000 MG tablet Take 1,000 mg by mouth 3 (three) times a week.     [name] aspirin EC 81 MG EC tablet Take 1 tablet (81 mg total) by mouth daily.    . cholecalciferol (VITAMIN D) 1000 UNITS tablet Take 1,000 Units by mouth See admin instructions. On Monday, Tuesday, Wednesday, Friday, Saturday.    . COMBIGAN 0.2-0.5 % ophthalmic solution Place 1-2 drops into both eyes daily.   0  . ergocalciferol (VITAMIN D2) 50000 UNITS capsule Take 50,000 Units by mouth 2 (two) times a week. On Thursday and Sunday    . felodipine (PLENDIL) 10 MG 24 hr tablet Take 10 mg by mouth daily.    [name] FLUoxetine (PROZAC) 20 MG capsule Take 20 mg by mouth every morning.     [name] glipiZIDE (GLUCOTROL XL) 5 MG 24 hr tablet Take 5 mg by mouth at bedtime.     . Insulin Glargine (LANTUS SOLOSTAR) 100 UNIT/ML SOPN Inject 10 Units into the skin at bedtime.     [name] JANUVIA 100 MG tablet Take 100 mg by mouth every morning.     [name] levothyroxine (SYNTHROID, LEVOTHROID) 125 MCG tablet Take 125 mcg by mouth daily before breakfast.     . losartan (COZAAR) 25 MG tablet Take 25 mg by mouth daily.   0  . memantine (NAMENDA) 10 MG tablet Take 1 tablet (10 mg total) by mouth 2 (two) times daily. 180 tablet 3  . metoprolol tartrate (LOPRESSOR) 25 MG tablet Take 25 mg by mouth 2 (two) times daily.      . mirabegron ER (MYRBETRIQ) 50 MG TB24 Take 50 mg by mouth every evening.    . pioglitazone (ACTOS) 30 MG tablet Take 30 mg by mouth every morning.     . pravastatin (PRAVACHOL) 80 MG tablet take 1 tablet by mouth once daily (Patient taking differently: take 80 mg by mouth once daily) 90 tablet 3  . prednisoLONE sodium phosphate (INFLAMASE FORTE) 1 % ophthalmic solution Place 1 drop into the right eye 3 (three) times daily.    . QUEtiapine (SEROQUEL) 25 MG tablet Take 1 tablet (25 mg total) by mouth at bedtime. 30 tablet 5   . rivastigmine (EXELON) 4.6 mg/24hr Place 1 patch (4.6 mg total) onto the skin daily. 30 patch 12  . silver sulfADIAZINE (SILVADENE) 1 % cream Apply 1 application topically daily. 50 g 0   No current facility-administered medications for this visit.     REVIEW OF SYSTEMS:  [X]  denotes positive finding, [ ]  denotes negative  finding Cardiac  Comments:  Chest pain or chest pressure:    Shortness of breath upon exertion:    Short of breath when lying flat:    Irregular heart rhythm:        Vascular    Pain in calf, thigh, or hip brought on by ambulation:    Pain in feet at night that wakes you up from your sleep:     Blood clot in your veins:    Leg swelling:  x         PHYSICAL EXAM: Vitals:   06/02/17 1035  BP: (!) 154/68  Pulse: 69  Resp: 18  Temp: (!) 97.4 F (36.3 C)  SpO2: 96%  Weight: 178 lb (80.7 kg)  Height: 5' (1.524 m)    GENERAL: The patient is a well-nourished female, in no acute distress. The vital signs are documented above. CARDIOVASCULAR: 1+ anterior tibial pulse at her ankle. PULMONARY: There is good air exchange  MUSCULOSKELETAL: There are no major deformities or cyanosis. NEUROLOGIC: No focal weakness or paresthesias are detected. SKIN: Superficial callus with no surrounding erythema over her lateral foot which is proximal 1/2 cm in diameter. Also callus over her great toe PSYCHIATRIC: The patient has a normal affect.  DATA:  Ankle arm index today is 0.83 on the left compared 0.45 pre-intervention. Toe brachial index is 0.46 where she had absent flow prior to the intervention  MEDICAL ISSUES: Excellent result from her SFA stenting. Will continue her usual activities. We will see her in 6 months with duplex follow-up    Rosetta Posner, MD [facility] Tel [phone] Pager [phone]

## 2017-06-11 NOTE — Addendum Note (Signed)
Addended by: Lianne Cure A on: 06/11/2017 03:46 PM   Modules accepted: Orders

## 2017-06-15 ENCOUNTER — Encounter (HOSPITAL_BASED_OUTPATIENT_CLINIC_OR_DEPARTMENT_OTHER): Payer: Medicare Other | Attending: Internal Medicine

## 2017-06-15 ENCOUNTER — Ambulatory Visit: Payer: Medicare Other | Admitting: Podiatry

## 2017-06-15 DIAGNOSIS — E11621 Type 2 diabetes mellitus with foot ulcer: Secondary | ICD-10-CM | POA: Diagnosis present

## 2017-06-15 DIAGNOSIS — I10 Essential (primary) hypertension: Secondary | ICD-10-CM | POA: Diagnosis not present

## 2017-06-15 DIAGNOSIS — I2510 Atherosclerotic heart disease of native coronary artery without angina pectoris: Secondary | ICD-10-CM | POA: Insufficient documentation

## 2017-06-15 DIAGNOSIS — E1151 Type 2 diabetes mellitus with diabetic peripheral angiopathy without gangrene: Secondary | ICD-10-CM | POA: Insufficient documentation

## 2017-06-15 DIAGNOSIS — E1140 Type 2 diabetes mellitus with diabetic neuropathy, unspecified: Secondary | ICD-10-CM | POA: Diagnosis not present

## 2017-06-15 DIAGNOSIS — F0390 Unspecified dementia without behavioral disturbance: Secondary | ICD-10-CM | POA: Insufficient documentation

## 2017-06-15 DIAGNOSIS — L97522 Non-pressure chronic ulcer of other part of left foot with fat layer exposed: Secondary | ICD-10-CM | POA: Insufficient documentation

## 2017-07-20 ENCOUNTER — Ambulatory Visit: Payer: Medicare Other | Admitting: Podiatry

## 2017-07-27 ENCOUNTER — Encounter (HOSPITAL_BASED_OUTPATIENT_CLINIC_OR_DEPARTMENT_OTHER): Payer: Medicare Other | Attending: Internal Medicine

## 2017-07-27 DIAGNOSIS — I10 Essential (primary) hypertension: Secondary | ICD-10-CM | POA: Diagnosis not present

## 2017-07-27 DIAGNOSIS — E1151 Type 2 diabetes mellitus with diabetic peripheral angiopathy without gangrene: Secondary | ICD-10-CM | POA: Insufficient documentation

## 2017-07-27 DIAGNOSIS — I2510 Atherosclerotic heart disease of native coronary artery without angina pectoris: Secondary | ICD-10-CM | POA: Insufficient documentation

## 2017-07-27 DIAGNOSIS — Z872 Personal history of diseases of the skin and subcutaneous tissue: Secondary | ICD-10-CM | POA: Diagnosis not present

## 2017-07-27 DIAGNOSIS — F0390 Unspecified dementia without behavioral disturbance: Secondary | ICD-10-CM | POA: Insufficient documentation

## 2017-07-27 DIAGNOSIS — Z09 Encounter for follow-up examination after completed treatment for conditions other than malignant neoplasm: Secondary | ICD-10-CM | POA: Diagnosis present

## 2017-08-04 ENCOUNTER — Encounter: Payer: Self-pay | Admitting: Podiatry

## 2017-08-04 ENCOUNTER — Ambulatory Visit: Payer: Medicare Other | Admitting: Podiatry

## 2017-08-04 VITALS — BP 157/73 | HR 64

## 2017-08-04 DIAGNOSIS — E1151 Type 2 diabetes mellitus with diabetic peripheral angiopathy without gangrene: Secondary | ICD-10-CM

## 2017-08-04 DIAGNOSIS — L84 Corns and callosities: Secondary | ICD-10-CM | POA: Diagnosis not present

## 2017-08-04 NOTE — Progress Notes (Signed)
   Subjective:    Patient ID: [name] [name], female    DOB: 02-11-1934, 83 y.o.   MRN: [id number]  HPI Patient presents today for follow-up evaluation for diabetic ulcers on the left foot. Patient was last evaluated 02/03/2017 and was referred to wound care center. Asian apparently is undergoing vascular stenting stent day in July 2018 and was apparently being followed by vascular surgeon. Patient states that the wounds have closed at this time   Review of Systems  All other systems reviewed and are negative.      Objective:   Physical Exam   Pleasant female presents today appears to be orientated 3. Caregiver present in treatment room DP and PT pulses nonpalpable Capillary reflex delay bilaterally No open skin lesions are noted Crusting lateral base of fifth metatarsal closed diabetic ulcer custed area on the left hallux previous ulcer site remains closed after debridement       Assessment & Plan:   Pre-ulcerative reactive callus left hallux and lateral fifth MPJ, left Peripheral arterial disease bilaterally  Plan: Debrided radius ulcerated sites which remain closed Instructed patient to apply Vaseline and Band-Aids to these areas until the skin looks normal  Reappoint at patient's request

## 2017-08-04 NOTE — Patient Instructions (Signed)
The wounds on the left foot are crusted and healed. Okay to put Vaseline and a Band-Aid over these areas until the skin looks normal Reappoint at the request  Diabetes and Foot Care Diabetes may cause you to have problems because of poor blood supply (circulation) to your feet and legs. This may cause the skin on your feet to become thinner, break easier, and heal more slowly. Your skin may become dry, and the skin may peel and crack. You may also have nerve damage in your legs and feet causing decreased feeling in them. You may not notice minor injuries to your feet that could lead to infections or more serious problems. Taking care of your feet is one of the most important things you can do for yourself. Follow these instructions at home:  Wear shoes at all times, even in the house. Do not go barefoot. Bare feet are easily injured.  Check your feet daily for blisters, cuts, and redness. If you cannot see the bottom of your feet, use a mirror or ask someone for help.  Wash your feet with warm water (do not use hot water) and mild soap. Then pat your feet and the areas between your toes until they are completely dry. Do not soak your feet as this can dry your skin.  Apply a moisturizing lotion or petroleum jelly (that does not contain alcohol and is unscented) to the skin on your feet and to dry, brittle toenails. Do not apply lotion between your toes.  Trim your toenails straight across. Do not dig under them or around the cuticle. File the edges of your nails with an emery board or nail file.  Do not cut corns or calluses or try to remove them with medicine.  Wear clean socks or stockings every day. Make sure they are not too tight. Do not wear knee-high stockings since they may decrease blood flow to your legs.  Wear shoes that fit properly and have enough cushioning. To break in new shoes, wear them for just a few hours a day. This prevents you from injuring your feet. Always look in your  shoes before you put them on to be sure there are no objects inside.  Do not cross your legs. This may decrease the blood flow to your feet.  If you find a minor scrape, cut, or break in the skin on your feet, keep it and the skin around it clean and dry. These areas may be cleansed with mild soap and water. Do not cleanse the area with peroxide, alcohol, or iodine.  When you remove an adhesive bandage, be sure not to damage the skin around it.  If you have a wound, look at it several times a day to make sure it is healing.  Do not use heating pads or hot water bottles. They may burn your skin. If you have lost feeling in your feet or legs, you may not know it is happening until it is too late.  Make sure your health care provider performs a complete foot exam at least annually or more often if you have foot problems. Report any cuts, sores, or bruises to your health care provider immediately. Contact a health care provider if:  You have an injury that is not healing.  You have cuts or breaks in the skin.  You have an ingrown nail.  You notice redness on your legs or feet.  You feel burning or tingling in your legs or feet.  You have  pain or cramps in your legs and feet.  Your legs or feet are numb.  Your feet always feel cold. Get help right away if:  There is increasing redness, swelling, or pain in or around a wound.  There is a red line that goes up your leg.  Pus is coming from a wound.  You develop a fever or as directed by your health care provider.  You notice a bad smell coming from an ulcer or wound. This information is not intended to replace advice given to you by your health care provider. Make sure you discuss any questions you have with your health care provider. Document Released: 08/29/2000 Document Revised: 02/07/2016 Document Reviewed: 02/08/2013 Elsevier Interactive Patient Education  2017 [employer].

## 2017-08-12 ENCOUNTER — Other Ambulatory Visit: Payer: Self-pay

## 2017-08-12 MED ORDER — PRAVASTATIN SODIUM 80 MG PO TABS: 80.0000 mg | ORAL_TABLET | Freq: Every day | ORAL | 2 refills | Status: DC

## 2017-08-27 ENCOUNTER — Ambulatory Visit: Payer: Medicare Other | Admitting: Adult Health

## 2017-10-01 ENCOUNTER — Other Ambulatory Visit: Payer: Self-pay | Admitting: Obstetrics and Gynecology

## 2017-10-02 ENCOUNTER — Other Ambulatory Visit: Payer: Self-pay | Admitting: Obstetrics and Gynecology

## 2017-10-27 ENCOUNTER — Encounter: Payer: Self-pay | Admitting: Cardiology

## 2017-10-27 ENCOUNTER — Other Ambulatory Visit: Payer: Self-pay | Admitting: Interventional Cardiology

## 2017-10-27 MED ORDER — FELODIPINE ER 10 MG PO TB24: 10.0000 mg | ORAL_TABLET | Freq: Every day | ORAL | 6 refills | Status: AC

## 2017-10-28 ENCOUNTER — Encounter: Payer: Self-pay | Admitting: Adult Health

## 2017-10-28 ENCOUNTER — Telehealth: Payer: Self-pay | Admitting: Adult Health

## 2017-10-28 ENCOUNTER — Ambulatory Visit (INDEPENDENT_AMBULATORY_CARE_PROVIDER_SITE_OTHER): Payer: Medicare Other | Admitting: Adult Health

## 2017-10-28 VITALS — BP 130/68 | HR 83 | Ht 60.0 in

## 2017-10-28 DIAGNOSIS — R413 Other amnesia: Secondary | ICD-10-CM

## 2017-10-28 NOTE — Patient Instructions (Addendum)
Your Plan:  Continue Exelon patch  Memory score is stable If your symptoms worsen or you develop new symptoms please let us know.   Thank you for coming to see [place] at [facility]. I hope we have been able to provide you high quality care today.  You may receive a patient satisfaction survey over the next few weeks. We would appreciate your feedback and comments so that we may continue to improve ourselves and the health of our patients.]

## 2017-10-28 NOTE — Progress Notes (Signed)
PATIENT: [name] [name] DOB: 1934-05-22  REASON FOR VISIT: follow up HISTORY FROM: patient  HISTORY OF PRESENT ILLNESS: Today 10/28/17 [name] [name] is an 84 year old female with a history of memory disturbance.  Patient returns today for evaluation.  At the last visit she was started on Exelon patch.  She reports that she is tolerating that well.  She is here today with her caregiver.  She lives at home alone.  She does have a caregiver that comes several times a week to help her.  She reports that she is able to complete all ADLs independently.  She reports that she sometimes has trouble managing her medications.  She prepares her own meals without difficulty.  Denies hallucinations.  She returns today for evaluation.  HISTORY The patient had developed some pressure ulcers on the left foot, she was found to have poor circulation, a stent was placed at the femoral artery, and she now expects wound healing to progress quicker. In the time since May last visit with her left been some safety features implemented, she is not walking without a walker. She still has physical therapy in the home now, and had a home safety inspection. Some furniture had to be moved and some rugs were removed, and she also got grab bars in her bathroom. Today's memory test was a Mini-Mental Status Examination and she scored 24 out of 30 points. The patient has noted by herself that she is more memory impaired, she is not always sure if home health or physical therapy has already visited her or not, it may be helpful for them to sign a paper and leave it in the room with a date for that she can later on the same day remind herself. They may just sign onto her visibly posted calendar. She has no new hallucinations. She has parkinsonian gait features.     REVIEW OF SYSTEMS: Out of a complete 14 system review of symptoms, the patient complains only of the following symptoms, and all other reviewed systems are negative.  See  HPI  ALLERGIES: Allergies  Allergen Reactions  . Amitriptyline Other (See Comments)    hallucinations  . Vesicare [Solifenacin] Other (See Comments)    hallucinations  . Vicodin [Hydrocodone-Acetaminophen] Other (See Comments)    Hallucinations - do NOT verify Vicodin orders  . Polysaccharide Iron Complex Hives, Itching and Rash    Patient can tolerate Ferrous Sulfate    HOME MEDICATIONS: Outpatient Medications Prior to Visit  Medication Sig Dispense Refill  . acetaminophen (TYLENOL) 650 MG CR tablet Take 650 mg by mouth every 8 (eight) hours as needed for pain.    [name] apixaban (ELIQUIS) 5 MG TABS tablet Take 1 tablet (5 mg total) by mouth 2 (two) times daily. 60 tablet 11  . Ascorbic Acid (VITAMIN C) 1000 MG tablet Take 1,000 mg by mouth 3 (three) times a week.     [name] aspirin EC 81 MG EC tablet Take 1 tablet (81 mg total) by mouth daily.    . cholecalciferol (VITAMIN D) 1000 UNITS tablet Take 1,000 Units by mouth See admin instructions. On Monday, Tuesday, Wednesday, Friday, Saturday.    . COMBIGAN 0.2-0.5 % ophthalmic solution Place 1-2 drops into both eyes daily.   0  . ergocalciferol (VITAMIN D2) 50000 UNITS capsule Take 50,000 Units by mouth 2 (two) times a week. On Thursday and Sunday    . felodipine (PLENDIL) 10 MG 24 hr tablet Take 1 tablet (10 mg total) by mouth daily.  30 tablet 6  . FLUoxetine (PROZAC) 20 MG capsule Take 20 mg by mouth every morning.     [name] glipiZIDE (GLUCOTROL XL) 5 MG 24 hr tablet Take 5 mg by mouth at bedtime.     . Insulin Glargine (LANTUS SOLOSTAR) 100 UNIT/ML SOPN Inject 10 Units into the skin at bedtime.     [name] JANUVIA 100 MG tablet Take 100 mg by mouth every morning.     [name] levothyroxine (SYNTHROID, LEVOTHROID) 125 MCG tablet Take 125 mcg by mouth daily before breakfast.     . losartan (COZAAR) 25 MG tablet Take 25 mg by mouth daily.   0  . memantine (NAMENDA) 10 MG tablet Take 1 tablet (10 mg total) by mouth 2 (two) times daily. 180 tablet 3  .  metoprolol tartrate (LOPRESSOR) 25 MG tablet Take 25 mg by mouth 2 (two) times daily.      . mirabegron ER (MYRBETRIQ) 50 MG TB24 Take 50 mg by mouth every evening.    . pioglitazone (ACTOS) 30 MG tablet Take 30 mg by mouth every morning.     . pravastatin (PRAVACHOL) 80 MG tablet Take 1 tablet (80 mg total) by mouth daily. 90 tablet 2  . prednisoLONE sodium phosphate (INFLAMASE FORTE) 1 % ophthalmic solution Place 1 drop into the right eye 3 (three) times daily.    . QUEtiapine (SEROQUEL) 25 MG tablet Take 1 tablet (25 mg total) by mouth at bedtime. 30 tablet 5  . rivastigmine (EXELON) 4.6 mg/24hr Place 1 patch (4.6 mg total) onto the skin daily. 30 patch 12  . silver sulfADIAZINE (SILVADENE) 1 % cream Apply 1 application topically daily. 50 g 0   No facility-administered medications prior to visit.     PAST MEDICAL HISTORY: Past Medical History:  Diagnosis Date  . Anemia   . Anxiety   . Arthritis    "right wrist, lower back, maybe my right shoulder" (04/08/2017)  . Atrial fibrillation ([place])   . Bilateral carotid artery stenosis   . Bowel incontinence   . CAD (coronary artery disease)    a. s/p CABG 2012.  [name] Chronic lower back pain    "back is very stiff because of fusion; can't stand up straight more than few moments"  . Closed fracture of unspecified part of neck of femur   . Complication of anesthesia    anesth. meds caused hallucinations   . Cough    no fever, chronic  . Degenerative joint disease   . Dementia   . Depression   . Diverticulitis   . Endometriosis   . Fall at home 05/15/2013  . GERD (gastroesophageal reflux disease)   . Headache   . Hiatal hernia   . History of blood transfusion   . History of wheezing   . HLD (hyperlipidemia)   . Hx of cardiovascular stress test    Lexiscan Myoview (11/15):  Low risk stress nuclear study with a small, mild fixed apical septal perfusion defect. EF is 52% with mild septal hypokinesis. No ischemia.  [name] Hx of echocardiogram     Echo (11/15):  Mod LVH, severe septal hypertrophy (suggestive HCM), no LVOT obstruction, rest gradient 15 mmHg across AV, vigorous LVF, EF 65-70%, no RWMA, Gr 1 DD, Ao sclerosis (no stenosis), mod LAE, mild RAE, mod TR, PASP 42 mmHg  . Hypertension   . Hypertonicity of bladder   . Hypertrophic cardiomyopathy ([place])   . Hypothyroidism   . Lumbago   . Muscle weakness (generalized)   .  Obesity   . Osteoarthrosis, unspecified whether generalized or localized, unspecified site   . Progressive dementia with uncertain etiology 1/61/0960   Suspect Lewy body .  [name] Reflux esophagitis   . Stroke [facility]) 2011   "old very small TIA/neurologist"  . Type II diabetes mellitus ([place])   . Ulcer    left foot x 2 years sees dr Little Ishikawa podiastrist for wound staying same tiny wart appearing areas no drainage changes dressing every 3 days  . Unspecified glaucoma(365.9)   . Urinary incontinence     PAST SURGICAL HISTORY: Past Surgical History:  Procedure Laterality Date  . ABDOMINAL AORTOGRAM W/LOWER EXTREMITY N/A 04/08/2017   Procedure: Abdominal Aortogram w/Lower Extremity;  Surgeon: Waynetta Sandy, MD;  Location: [place] [facility];  Service: Cardiovascular;  Laterality: N/A;  . BACK SURGERY     X2 lower  . BOTOX INJECTION N/A 12/02/2016   Procedure: BOTOX INJECTION;  Surgeon: Bjorn Loser, MD;  Location: [facility];  Service: Urology;  Laterality: N/A;  . CARDIAC CATHETERIZATION    . CARPAL TUNNEL RELEASE Bilateral   . CATARACT EXTRACTION W/ INTRAOCULAR LENS  IMPLANT, BILATERAL Bilateral   . CHOLECYSTECTOMY    . CORONARY ARTERY BYPASS GRAFT  04/17/2011   LT  internal mammary artery to left anterior descending, saphenous vein graft first diagonal, saphenous  vein graft to obtuse marginal 1, saphenous vein graft to posterior descending  . CYSTOSCOPY N/A 12/02/2016   Procedure: CYSTOSCOPY;  Surgeon: Bjorn Loser, MD;  Location: [facility];  Service: Urology;  Laterality: N/A;  . DILATION AND  CURETTAGE OF UTERUS     "for endometrosis"  . DILATION AND CURETTAGE OF UTERUS     ENDOMETRIOSIS  . ENDARTERECTOMY Left 10/01/2015   Procedure: Left CAROTID Endartarectomy;  Surgeon: Rosetta Posner, MD;  Location: [place];  Service: Vascular;  Laterality: Left;  . EYE SURGERY    . HAND SURGERY Right    "for arthritis;"  . JOINT REPLACEMENT    . LASER PHOTO ABLATION Right 01/20/2013   Procedure: LASER PHOTO ABLATION;  Surgeon: Hayden Pedro, MD;  Location: [place] [facility];  Service: Ophthalmology;  Laterality: Right;  . LOWER EXTREMITY ANGIOGRAM N/A 03/10/2012   Procedure: LOWER EXTREMITY ANGIOGRAM;  Surgeon: Jettie Booze, MD;  Location: [facility];  Service: Cardiovascular;  Laterality: N/A;  . NEUROPLASTY / TRANSPOSITION MEDIAN NERVE AT CARPAL TUNNEL BILATERAL    . PARTIAL HIP ARTHROPLASTY Left 11/2009   "just the ball"  . PERIPHERAL VASCULAR CATHETERIZATION N/A 05/16/2015   Procedure: Abdominal Aortogram;  Surgeon: Elam Dutch, MD;  Location: [place] [facility];  Service: Cardiovascular;  Laterality: N/A;  . PERIPHERAL VASCULAR CATHETERIZATION Left 05/16/2015   Procedure: Lower Extremity Angiography;  Surgeon: Elam Dutch, MD;  Location: [place] [facility];  Service: Cardiovascular;  Laterality: Left;  . PERIPHERAL VASCULAR INTERVENTION  04/08/2017   Procedure: Peripheral Vascular Intervention;  Surgeon: Waynetta Sandy, MD;  Location: [place] [facility];  Service: Cardiovascular;;  lt.Popliteal  . POSTERIOR FUSION LUMBAR SPINE     "removed L4-5, S1"  . PTCA  03/10/12   LLE  . RETINAL DETACHMENT SURGERY Right May 2014  . SCLERAL BUCKLE Right 01/20/2013   Procedure: SCLERAL BUCKLE;  Surgeon: Hayden Pedro, MD;  Location: [facility];  Service: Ophthalmology;  Laterality: Right;  . TOTAL KNEE ARTHROPLASTY  ~ 2004   right  . TOTAL SHOULDER ARTHROPLASTY Right 10/07/2013   Procedure: TOTAL SHOULDER ARTHROPLASTY;  Surgeon: Alta Corning,  MD;  Location: [place];  Service: Orthopedics;   Laterality: Right;    FAMILY HISTORY: Family History  Problem Relation Age of Onset  . Cancer Cousin   . Heart attack Mother   . Hypertension Mother   . Heart attack Father   . Heart disease Father        Before age 25  . Hyperlipidemia Father   . Hypertension Father   . Cancer Paternal Aunt     SOCIAL HISTORY: Social History   Socioeconomic History  . Marital status: Divorced    Spouse name: Not on file  . Number of children: 2  . Years of education: COLLEGE  . Highest education level: Not on file  Social Needs  . Financial resource strain: Not on file  . Food insecurity - worry: Not on file  . Food insecurity - inability: Not on file  . Transportation needs - medical: Not on file  . Transportation needs - non-medical: Not on file  Occupational History    Comment: retired Pharmacist, hospital  Tobacco Use  . Smoking status: Former Smoker    Packs/day: 1.50    Years: 38.00    Pack years: 57.00    Types: Cigarettes    Last attempt to quit: 09/16/1987    Years since quitting: 30.1  . Smokeless tobacco: Never Used  Substance and Sexual Activity  . Alcohol use: No    Alcohol/week: 0.0 oz  . Drug use: No  . Sexual activity: No  Other Topics Concern  . Not on file  Social History Narrative   Patient is divorced and lives alone.   Patient has two adult children.   Patient is a retired Pharmacist, hospital.   Patient has a college education.   Patient is left-handed.   Patient does not drink any caffeine.      PHYSICAL EXAM  Vitals:   10/28/17 1347  Height: 5' (1.524 m)   Body mass index is 34.76 kg/m. MMSE - Mini Mental State Exam 10/28/2017 05/13/2017 05/13/2017  Orientation to time 5 4 4   Orientation to Place 4 5 5   Registration 3 3 3   Attention/ Calculation 3 2 -  Recall 0 2 -  Language- name 2 objects 2 2 -  Language- repeat 1 1 -  Language- follow 3 step command 3 3 -  Language- read & follow direction 1 1 -  Write a sentence 1 1 -  Copy design 1 0 -  Total score 24 24  -    Generalized: Well developed, in no acute distress   Neurological examination  Mentation: Alert oriented to time, place, history taking. Follows all commands speech and language fluent Cranial nerve II-XII: Pupils were equal round reactive to light. Extraocular movements were full, visual field were full on confrontational test. Facial sensation and strength were normal. Uvula tongue midline. Head turning and shoulder shrug  were normal and symmetric. Motor: The motor testing reveals 5 over 5 strength of all 4 extremities. Good symmetric motor tone is noted throughout.  Sensory: Sensory testing is intact to soft touch on all 4 extremities. No evidence of extinction is noted.  Coordination: Cerebellar testing reveals good finger-nose-finger and heel-to-shin bilaterally.  Gait and station: Patient is in a wheelchair   DIAGNOSTIC DATA (LABS, IMAGING, TESTING) - I reviewed patient records, labs, notes, testing and imaging myself where available.  Lab Results  Component Value Date   WBC 7.4 04/09/2017   HGB 10.7 (L) 04/09/2017   HCT 33.6 (L) 04/09/2017  MCV 101.2 (H) 04/09/2017   PLT 163 04/09/2017      Component Value Date/Time   NA 138 04/09/2017 0227   K 3.8 04/09/2017 0227   CL 106 04/09/2017 0227   CO2 25 04/09/2017 0227   GLUCOSE 153 (H) 04/09/2017 0227   BUN 21 (H) 04/09/2017 0227   CREATININE 0.94 04/09/2017 0227   CALCIUM 8.7 (L) 04/09/2017 0227   PROT 6.6 09/28/2015 1315   ALBUMIN 3.7 09/28/2015 1315   AST 25 09/28/2015 1315   ALT 19 09/28/2015 1315   ALKPHOS 67 09/28/2015 1315   BILITOT 0.7 09/28/2015 1315   GFRNONAA 55 (L) 04/09/2017 0227   GFRAA >60 04/09/2017 0227   Lab Results  Component Value Date   CHOL 157 06/26/2014   HDL 30.80 (L) 06/26/2014   LDLCALC 123 (H) 03/20/2014   LDLDIRECT 73.3 06/26/2014   TRIG 250.0 (H) 06/26/2014   CHOLHDL 5 06/26/2014   Lab Results  Component Value Date   HGBA1C 7.1 (H) 12/01/2016   Lab Results  Component  Value Date   VITAMINB12 408 11/22/2009   Lab Results  Component Value Date   TSH 0.072 (L) 04/21/2011      ASSESSMENT AND PLAN 84 y.o. year old female  has a past medical history of Anemia, Anxiety, Arthritis, Atrial fibrillation ([place]), Bilateral carotid artery stenosis, Bowel incontinence, CAD (coronary artery disease), Chronic lower back pain, Closed fracture of unspecified part of neck of femur, Complication of anesthesia, Cough, Degenerative joint disease, Dementia, Depression, Diverticulitis, Endometriosis, Fall at home (05/15/2013), GERD (gastroesophageal reflux disease), Headache, Hiatal hernia, History of blood transfusion, History of wheezing, HLD (hyperlipidemia), cardiovascular stress test, echocardiogram, Hypertension, Hypertonicity of bladder, Hypertrophic cardiomyopathy ([place]), Hypothyroidism, Lumbago, Muscle weakness (generalized), Obesity, Osteoarthrosis, unspecified whether generalized or localized, unspecified site, Progressive dementia with uncertain etiology (1/61/0960), Reflux esophagitis, Stroke ([place]) (2011), Type II diabetes mellitus ([place]), Ulcer, Unspecified glaucoma(365.9), and Urinary incontinence. here with:  1.  Memory disturbance  Patient's memory score has remained stable.  She will continue on Exelon patch.  I have advised that if her symptoms worsen or she develops new symptoms she should let us know.  She will follow-up in 6 months or sooner if needed.  I spent 15 minutes with the patient. 50% of this time was spent Reviewing patient's memory score.   Ward Givens, MSN, NP-C 10/28/2017, 1:59 PM [facility] [address], [place], [place] [address] [phone]

## 2017-10-28 NOTE — Telephone Encounter (Signed)
Pt came with caregiver, Manuela Schwartz today at her appt with MM/NP.

## 2017-10-28 NOTE — Telephone Encounter (Signed)
Pt called twice today thinking she cancelled today's appt. Pt said her caregiver will be bringing her today, her daughter has had surgery and can't drive. Pt said several times she has appt with Dr Brett Fairy today but I have reminded her each time it is with [facility]. FYI

## 2017-10-29 NOTE — Progress Notes (Signed)
I agree with the assessment and plan as directed by NP .The patient is known to me .   Neshawn Aird, MD  

## 2017-11-04 ENCOUNTER — Encounter (INDEPENDENT_AMBULATORY_CARE_PROVIDER_SITE_OTHER): Payer: Medicare Other | Admitting: Ophthalmology

## 2017-11-04 DIAGNOSIS — E113593 Type 2 diabetes mellitus with proliferative diabetic retinopathy without macular edema, bilateral: Secondary | ICD-10-CM | POA: Diagnosis not present

## 2017-11-04 DIAGNOSIS — H338 Other retinal detachments: Secondary | ICD-10-CM

## 2017-11-04 DIAGNOSIS — H43813 Vitreous degeneration, bilateral: Secondary | ICD-10-CM

## 2017-11-04 DIAGNOSIS — E11319 Type 2 diabetes mellitus with unspecified diabetic retinopathy without macular edema: Secondary | ICD-10-CM

## 2017-11-04 DIAGNOSIS — I10 Essential (primary) hypertension: Secondary | ICD-10-CM

## 2017-11-04 DIAGNOSIS — H35033 Hypertensive retinopathy, bilateral: Secondary | ICD-10-CM | POA: Diagnosis not present

## 2017-12-01 ENCOUNTER — Ambulatory Visit: Payer: Medicare Other | Admitting: Family

## 2017-12-01 ENCOUNTER — Inpatient Hospital Stay (HOSPITAL_COMMUNITY): Admission: RE | Admit: 2017-12-01 | Payer: Medicare Other | Source: Ambulatory Visit

## 2017-12-01 ENCOUNTER — Other Ambulatory Visit (HOSPITAL_COMMUNITY): Payer: Medicare Other

## 2017-12-15 ENCOUNTER — Encounter (HOSPITAL_COMMUNITY): Payer: Medicare Other

## 2017-12-15 ENCOUNTER — Ambulatory Visit: Payer: Medicare Other | Admitting: Family

## 2018-01-12 ENCOUNTER — Other Ambulatory Visit: Payer: Self-pay

## 2018-01-12 DIAGNOSIS — I6523 Occlusion and stenosis of bilateral carotid arteries: Secondary | ICD-10-CM

## 2018-01-12 DIAGNOSIS — I739 Peripheral vascular disease, unspecified: Secondary | ICD-10-CM

## 2018-01-12 DIAGNOSIS — I70229 Atherosclerosis of native arteries of extremities with rest pain, unspecified extremity: Secondary | ICD-10-CM

## 2018-01-12 DIAGNOSIS — I998 Other disorder of circulatory system: Secondary | ICD-10-CM

## 2018-01-23 ENCOUNTER — Other Ambulatory Visit: Payer: Self-pay | Admitting: Neurology

## 2018-01-23 DIAGNOSIS — R441 Visual hallucinations: Secondary | ICD-10-CM

## 2018-02-12 ENCOUNTER — Other Ambulatory Visit (HOSPITAL_COMMUNITY): Payer: Medicare Other

## 2018-02-12 ENCOUNTER — Encounter (HOSPITAL_COMMUNITY): Payer: Medicare Other

## 2018-02-12 ENCOUNTER — Ambulatory Visit: Payer: Medicare Other | Admitting: Family

## 2018-02-14 ENCOUNTER — Inpatient Hospital Stay (HOSPITAL_COMMUNITY): Payer: Medicare Other

## 2018-02-14 ENCOUNTER — Inpatient Hospital Stay (HOSPITAL_COMMUNITY)
Admission: EM | Admit: 2018-02-14 | Discharge: 2018-02-18 | DRG: 291 | Disposition: A | Discharge status: Skilled Nursing Facility | Payer: Medicare Other | Attending: Internal Medicine | Admitting: Internal Medicine

## 2018-02-14 ENCOUNTER — Other Ambulatory Visit: Payer: Self-pay

## 2018-02-14 ENCOUNTER — Emergency Department (HOSPITAL_COMMUNITY): Payer: Medicare Other

## 2018-02-14 ENCOUNTER — Encounter (HOSPITAL_COMMUNITY): Payer: Self-pay

## 2018-02-14 DIAGNOSIS — Z888 Allergy status to other drugs, medicaments and biological substances status: Secondary | ICD-10-CM

## 2018-02-14 DIAGNOSIS — Z8249 Family history of ischemic heart disease and other diseases of the circulatory system: Secondary | ICD-10-CM

## 2018-02-14 DIAGNOSIS — E1143 Type 2 diabetes mellitus with diabetic autonomic (poly)neuropathy: Secondary | ICD-10-CM | POA: Diagnosis not present

## 2018-02-14 DIAGNOSIS — Z7901 Long term (current) use of anticoagulants: Secondary | ICD-10-CM

## 2018-02-14 DIAGNOSIS — I480 Paroxysmal atrial fibrillation: Secondary | ICD-10-CM

## 2018-02-14 DIAGNOSIS — G8929 Other chronic pain: Secondary | ICD-10-CM | POA: Diagnosis present

## 2018-02-14 DIAGNOSIS — I2510 Atherosclerotic heart disease of native coronary artery without angina pectoris: Secondary | ICD-10-CM | POA: Diagnosis not present

## 2018-02-14 DIAGNOSIS — I272 Pulmonary hypertension, unspecified: Secondary | ICD-10-CM

## 2018-02-14 DIAGNOSIS — M549 Dorsalgia, unspecified: Secondary | ICD-10-CM

## 2018-02-14 DIAGNOSIS — I739 Peripheral vascular disease, unspecified: Secondary | ICD-10-CM | POA: Diagnosis not present

## 2018-02-14 DIAGNOSIS — I5033 Acute on chronic diastolic (congestive) heart failure: Secondary | ICD-10-CM | POA: Diagnosis present

## 2018-02-14 DIAGNOSIS — Z8349 Family history of other endocrine, nutritional and metabolic diseases: Secondary | ICD-10-CM

## 2018-02-14 DIAGNOSIS — Z79899 Other long term (current) drug therapy: Secondary | ICD-10-CM

## 2018-02-14 DIAGNOSIS — E11319 Type 2 diabetes mellitus with unspecified diabetic retinopathy without macular edema: Secondary | ICD-10-CM | POA: Diagnosis not present

## 2018-02-14 DIAGNOSIS — D539 Nutritional anemia, unspecified: Secondary | ICD-10-CM | POA: Diagnosis present

## 2018-02-14 DIAGNOSIS — E11649 Type 2 diabetes mellitus with hypoglycemia without coma: Secondary | ICD-10-CM | POA: Diagnosis present

## 2018-02-14 DIAGNOSIS — Z66 Do not resuscitate: Secondary | ICD-10-CM | POA: Diagnosis not present

## 2018-02-14 DIAGNOSIS — I25708 Atherosclerosis of coronary artery bypass graft(s), unspecified, with other forms of angina pectoris: Secondary | ICD-10-CM | POA: Diagnosis not present

## 2018-02-14 DIAGNOSIS — M19031 Primary osteoarthritis, right wrist: Secondary | ICD-10-CM | POA: Diagnosis not present

## 2018-02-14 DIAGNOSIS — I6523 Occlusion and stenosis of bilateral carotid arteries: Secondary | ICD-10-CM | POA: Diagnosis present

## 2018-02-14 DIAGNOSIS — G3183 Dementia with Lewy bodies: Secondary | ICD-10-CM | POA: Diagnosis not present

## 2018-02-14 DIAGNOSIS — F0281 Dementia in other diseases classified elsewhere with behavioral disturbance: Secondary | ICD-10-CM | POA: Diagnosis not present

## 2018-02-14 DIAGNOSIS — E785 Hyperlipidemia, unspecified: Secondary | ICD-10-CM | POA: Diagnosis present

## 2018-02-14 DIAGNOSIS — E669 Obesity, unspecified: Secondary | ICD-10-CM | POA: Diagnosis not present

## 2018-02-14 DIAGNOSIS — Z515 Encounter for palliative care: Secondary | ICD-10-CM

## 2018-02-14 DIAGNOSIS — F329 Major depressive disorder, single episode, unspecified: Secondary | ICD-10-CM | POA: Diagnosis present

## 2018-02-14 DIAGNOSIS — Z96659 Presence of unspecified artificial knee joint: Secondary | ICD-10-CM | POA: Diagnosis present

## 2018-02-14 DIAGNOSIS — Z9842 Cataract extraction status, left eye: Secondary | ICD-10-CM

## 2018-02-14 DIAGNOSIS — E538 Deficiency of other specified B group vitamins: Secondary | ICD-10-CM

## 2018-02-14 DIAGNOSIS — Z7982 Long term (current) use of aspirin: Secondary | ICD-10-CM

## 2018-02-14 DIAGNOSIS — Z79890 Hormone replacement therapy (postmenopausal): Secondary | ICD-10-CM

## 2018-02-14 DIAGNOSIS — I110 Hypertensive heart disease with heart failure: Principal | ICD-10-CM | POA: Diagnosis present

## 2018-02-14 DIAGNOSIS — R339 Retention of urine, unspecified: Secondary | ICD-10-CM | POA: Diagnosis present

## 2018-02-14 DIAGNOSIS — Z96611 Presence of right artificial shoulder joint: Secondary | ICD-10-CM | POA: Diagnosis present

## 2018-02-14 DIAGNOSIS — J9601 Acute respiratory failure with hypoxia: Secondary | ICD-10-CM | POA: Diagnosis not present

## 2018-02-14 DIAGNOSIS — E039 Hypothyroidism, unspecified: Secondary | ICD-10-CM | POA: Diagnosis not present

## 2018-02-14 DIAGNOSIS — I5032 Chronic diastolic (congestive) heart failure: Secondary | ICD-10-CM | POA: Diagnosis present

## 2018-02-14 DIAGNOSIS — Z951 Presence of aortocoronary bypass graft: Secondary | ICD-10-CM

## 2018-02-14 DIAGNOSIS — I482 Chronic atrial fibrillation: Secondary | ICD-10-CM | POA: Diagnosis not present

## 2018-02-14 DIAGNOSIS — N318 Other neuromuscular dysfunction of bladder: Secondary | ICD-10-CM | POA: Diagnosis present

## 2018-02-14 DIAGNOSIS — I509 Heart failure, unspecified: Secondary | ICD-10-CM

## 2018-02-14 DIAGNOSIS — Z8673 Personal history of transient ischemic attack (TIA), and cerebral infarction without residual deficits: Secondary | ICD-10-CM

## 2018-02-14 DIAGNOSIS — Z9841 Cataract extraction status, right eye: Secondary | ICD-10-CM

## 2018-02-14 DIAGNOSIS — M479 Spondylosis, unspecified: Secondary | ICD-10-CM | POA: Diagnosis present

## 2018-02-14 DIAGNOSIS — K3184 Gastroparesis: Secondary | ICD-10-CM | POA: Diagnosis present

## 2018-02-14 DIAGNOSIS — I422 Other hypertrophic cardiomyopathy: Secondary | ICD-10-CM | POA: Diagnosis present

## 2018-02-14 DIAGNOSIS — I2721 Secondary pulmonary arterial hypertension: Secondary | ICD-10-CM | POA: Diagnosis not present

## 2018-02-14 DIAGNOSIS — I4819 Other persistent atrial fibrillation: Secondary | ICD-10-CM | POA: Diagnosis present

## 2018-02-14 DIAGNOSIS — H409 Unspecified glaucoma: Secondary | ICD-10-CM | POA: Diagnosis present

## 2018-02-14 DIAGNOSIS — I10 Essential (primary) hypertension: Secondary | ICD-10-CM | POA: Diagnosis not present

## 2018-02-14 DIAGNOSIS — H33001 Unspecified retinal detachment with retinal break, right eye: Secondary | ICD-10-CM | POA: Diagnosis not present

## 2018-02-14 DIAGNOSIS — F419 Anxiety disorder, unspecified: Secondary | ICD-10-CM | POA: Diagnosis present

## 2018-02-14 DIAGNOSIS — E1151 Type 2 diabetes mellitus with diabetic peripheral angiopathy without gangrene: Secondary | ICD-10-CM | POA: Diagnosis present

## 2018-02-14 DIAGNOSIS — Z6834 Body mass index (BMI) 34.0-34.9, adult: Secondary | ICD-10-CM | POA: Diagnosis not present

## 2018-02-14 DIAGNOSIS — Z961 Presence of intraocular lens: Secondary | ICD-10-CM | POA: Diagnosis present

## 2018-02-14 DIAGNOSIS — K219 Gastro-esophageal reflux disease without esophagitis: Secondary | ICD-10-CM | POA: Diagnosis present

## 2018-02-14 DIAGNOSIS — E559 Vitamin D deficiency, unspecified: Secondary | ICD-10-CM

## 2018-02-14 DIAGNOSIS — Z87891 Personal history of nicotine dependence: Secondary | ICD-10-CM

## 2018-02-14 DIAGNOSIS — Z885 Allergy status to narcotic agent status: Secondary | ICD-10-CM

## 2018-02-14 DIAGNOSIS — Z96642 Presence of left artificial hip joint: Secondary | ICD-10-CM | POA: Diagnosis present

## 2018-02-14 DIAGNOSIS — I071 Rheumatic tricuspid insufficiency: Secondary | ICD-10-CM | POA: Diagnosis present

## 2018-02-14 DIAGNOSIS — R0602 Shortness of breath: Secondary | ICD-10-CM | POA: Diagnosis present

## 2018-02-14 DIAGNOSIS — I5031 Acute diastolic (congestive) heart failure: Secondary | ICD-10-CM

## 2018-02-14 DIAGNOSIS — I779 Disorder of arteries and arterioles, unspecified: Secondary | ICD-10-CM | POA: Diagnosis present

## 2018-02-14 HISTORY — DX: Deficiency of other specified B group vitamins: E53.8

## 2018-02-14 HISTORY — DX: Chronic diastolic (congestive) heart failure: I50.32

## 2018-02-14 HISTORY — DX: Vitamin D deficiency, unspecified: E55.9

## 2018-02-14 HISTORY — DX: Pulmonary hypertension, unspecified: I27.20

## 2018-02-14 HISTORY — DX: Retention of urine, unspecified: R33.9

## 2018-02-14 LAB — CBC WITH DIFFERENTIAL/PLATELET
BASOS ABS: 0.1 10*3/uL (ref 0.0–0.1)
Basophils Relative: 1 %
Eosinophils Absolute: 0.4 10*3/uL (ref 0.0–0.7)
Eosinophils Relative: 4 %
HEMATOCRIT: 32.6 % — AB (ref 36.0–46.0)
Hemoglobin: 9.8 g/dL — ABNORMAL LOW (ref 12.0–15.0)
LYMPHS ABS: 1.5 10*3/uL (ref 0.7–4.0)
LYMPHS PCT: 15 %
MCH: 34.1 pg — ABNORMAL HIGH (ref 26.0–34.0)
MCHC: 30.1 g/dL (ref 30.0–36.0)
MCV: 113.6 fL — ABNORMAL HIGH (ref 78.0–100.0)
MONOS PCT: 12 %
Monocytes Absolute: 1.2 10*3/uL — ABNORMAL HIGH (ref 0.1–1.0)
NEUTROS PCT: 68 %
Neutro Abs: 6.5 10*3/uL (ref 1.7–7.7)
Platelets: 228 10*3/uL (ref 150–400)
RBC: 2.87 MIL/uL — AB (ref 3.87–5.11)
RDW: 15.6 % — AB (ref 11.5–15.5)
WBC: 9.7 10*3/uL (ref 4.0–10.5)

## 2018-02-14 LAB — URINALYSIS, ROUTINE W REFLEX MICROSCOPIC
BACTERIA UA: NONE SEEN
Bilirubin Urine: NEGATIVE
Glucose, UA: NEGATIVE mg/dL
KETONES UR: NEGATIVE mg/dL
Leukocytes, UA: NEGATIVE
NITRITE: NEGATIVE
PH: 6 (ref 5.0–8.0)
Protein, ur: NEGATIVE mg/dL
Specific Gravity, Urine: 1.006 (ref 1.005–1.030)

## 2018-02-14 LAB — BASIC METABOLIC PANEL
ANION GAP: 8 (ref 5–15)
BUN: 20 mg/dL (ref 6–20)
CO2: 25 mmol/L (ref 22–32)
Calcium: 9.1 mg/dL (ref 8.9–10.3)
Chloride: 106 mmol/L (ref 101–111)
Creatinine, Ser: 1.04 mg/dL — ABNORMAL HIGH (ref 0.44–1.00)
GFR, EST AFRICAN AMERICAN: 57 mL/min — AB (ref >60)
GFR, EST NON AFRICAN AMERICAN: 49 mL/min — AB (ref >60)
Glucose, Bld: 173 mg/dL — ABNORMAL HIGH (ref 65–99)
POTASSIUM: 4.4 mmol/L (ref 3.5–5.1)
SODIUM: 139 mmol/L (ref 135–145)

## 2018-02-14 LAB — MAGNESIUM: MAGNESIUM: 1.8 mg/dL (ref 1.7–2.4)

## 2018-02-14 LAB — MRSA PCR SCREENING: MRSA by PCR: POSITIVE — AB

## 2018-02-14 LAB — GLUCOSE, CAPILLARY
Glucose-Capillary: 230 mg/dL — ABNORMAL HIGH (ref 65–99)
Glucose-Capillary: 165 mg/dL — ABNORMAL HIGH (ref 65–99)

## 2018-02-14 LAB — CBG MONITORING, ED: Glucose-Capillary: 149 mg/dL — ABNORMAL HIGH (ref 65–99)

## 2018-02-14 LAB — TROPONIN I
Troponin I: 0.03 ng/mL (ref <0.03)
Troponin I: 0.04 ng/mL (ref <0.03)

## 2018-02-14 LAB — I-STAT TROPONIN, ED: Troponin i, poc: 0.02 ng/mL (ref 0.00–0.08)

## 2018-02-14 LAB — HEMOGLOBIN A1C
Hgb A1c MFr Bld: 5.7 % — ABNORMAL HIGH (ref 4.8–5.6)
MEAN PLASMA GLUCOSE: 116.89 mg/dL

## 2018-02-14 LAB — BRAIN NATRIURETIC PEPTIDE: B Natriuretic Peptide: 591.4 pg/mL — ABNORMAL HIGH (ref 0.0–100.0)

## 2018-02-14 LAB — TSH: TSH: 1.289 u[IU]/mL (ref 0.350–4.500)

## 2018-02-14 LAB — I-STAT CG4 LACTIC ACID, ED: Lactic Acid, Venous: 0.71 mmol/L (ref 0.5–1.9)

## 2018-02-14 MED ORDER — POTASSIUM CHLORIDE CRYS ER 20 MEQ PO TBCR
20.0000 meq | EXTENDED_RELEASE_TABLET | Freq: Two times a day (BID) | ORAL | Status: DC
  Administered 2018-02-14 – 2018-02-18 (×9): 20 meq via ORAL
  Filled 2018-02-14 (×9): qty 1

## 2018-02-14 MED ORDER — AMOXICILLIN-POT CLAVULANATE 875-125 MG PO TABS
1.0000 | ORAL_TABLET | Freq: Two times a day (BID) | ORAL | Status: AC
  Administered 2018-02-14 (×2): 1 via ORAL
  Filled 2018-02-14 (×2): qty 1

## 2018-02-14 MED ORDER — FLUOXETINE HCL 20 MG PO CAPS
20.0000 mg | ORAL_CAPSULE | Freq: Every morning | ORAL | Status: DC
  Administered 2018-02-15 – 2018-02-18 (×4): 20 mg via ORAL
  Filled 2018-02-14 (×5): qty 1

## 2018-02-14 MED ORDER — PIOGLITAZONE HCL 30 MG PO TABS
30.0000 mg | ORAL_TABLET | Freq: Every morning | ORAL | Status: DC
  Administered 2018-02-15: 30 mg via ORAL
  Filled 2018-02-14: qty 1

## 2018-02-14 MED ORDER — LEVOTHYROXINE SODIUM 25 MCG PO TABS
125.0000 ug | ORAL_TABLET | Freq: Every day | ORAL | Status: DC
  Administered 2018-02-15 – 2018-02-18 (×4): 125 ug via ORAL
  Filled 2018-02-14 (×5): qty 1

## 2018-02-14 MED ORDER — VITAMIN C 500 MG PO TABS
1000.0000 mg | ORAL_TABLET | ORAL | Status: DC
  Administered 2018-02-15 – 2018-02-17 (×2): 1000 mg via ORAL
  Filled 2018-02-14 (×2): qty 2

## 2018-02-14 MED ORDER — LOSARTAN POTASSIUM 25 MG PO TABS
25.0000 mg | ORAL_TABLET | Freq: Every day | ORAL | Status: DC
  Administered 2018-02-15 – 2018-02-18 (×4): 25 mg via ORAL
  Filled 2018-02-14 (×5): qty 1

## 2018-02-14 MED ORDER — ASPIRIN EC 81 MG PO TBEC
81.0000 mg | DELAYED_RELEASE_TABLET | Freq: Every day | ORAL | Status: DC
  Administered 2018-02-14 – 2018-02-15 (×2): 81 mg via ORAL
  Filled 2018-02-14 (×2): qty 1

## 2018-02-14 MED ORDER — BRIMONIDINE TARTRATE-TIMOLOL 0.2-0.5 % OP SOLN
1.0000 [drp] | Freq: Two times a day (BID) | OPHTHALMIC | Status: DC
  Filled 2018-02-14: qty 5

## 2018-02-14 MED ORDER — RIVASTIGMINE 4.6 MG/24HR TD PT24
4.6000 mg | MEDICATED_PATCH | Freq: Every day | TRANSDERMAL | Status: DC
  Administered 2018-02-15 – 2018-02-18 (×4): 4.6 mg via TRANSDERMAL
  Filled 2018-02-14 (×5): qty 1

## 2018-02-14 MED ORDER — VITAMIN B-12 100 MCG PO TABS
100.0000 ug | ORAL_TABLET | Freq: Every day | ORAL | Status: DC
  Administered 2018-02-15 – 2018-02-18 (×3): 100 ug via ORAL
  Filled 2018-02-14 (×5): qty 1

## 2018-02-14 MED ORDER — SODIUM CHLORIDE 0.9% FLUSH
3.0000 mL | Freq: Two times a day (BID) | INTRAVENOUS | Status: DC
  Administered 2018-02-14 – 2018-02-18 (×8): 3 mL via INTRAVENOUS

## 2018-02-14 MED ORDER — MEMANTINE HCL 10 MG PO TABS
10.0000 mg | ORAL_TABLET | Freq: Two times a day (BID) | ORAL | Status: DC
  Administered 2018-02-14 – 2018-02-18 (×8): 10 mg via ORAL
  Filled 2018-02-14 (×10): qty 1

## 2018-02-14 MED ORDER — INSULIN ASPART 100 UNIT/ML ~~LOC~~ SOLN
0.0000 [IU] | Freq: Three times a day (TID) | SUBCUTANEOUS | Status: DC
  Administered 2018-02-14: 5 [IU] via SUBCUTANEOUS
  Administered 2018-02-14: 2 [IU] via SUBCUTANEOUS
  Administered 2018-02-15: 5 [IU] via SUBCUTANEOUS
  Administered 2018-02-15: 2 [IU] via SUBCUTANEOUS
  Administered 2018-02-15: 3 [IU] via SUBCUTANEOUS
  Administered 2018-02-16 – 2018-02-18 (×5): 2 [IU] via SUBCUTANEOUS
  Filled 2018-02-14: qty 1

## 2018-02-14 MED ORDER — BRIMONIDINE TARTRATE 0.2 % OP SOLN
1.0000 [drp] | Freq: Two times a day (BID) | OPHTHALMIC | Status: DC
  Administered 2018-02-15 – 2018-02-18 (×6): 1 [drp] via OPHTHALMIC
  Filled 2018-02-14 (×2): qty 5

## 2018-02-14 MED ORDER — ACETAMINOPHEN 500 MG PO TABS
1000.0000 mg | ORAL_TABLET | Freq: Four times a day (QID) | ORAL | Status: DC
  Administered 2018-02-14 – 2018-02-18 (×14): 1000 mg via ORAL
  Filled 2018-02-14 (×15): qty 2

## 2018-02-14 MED ORDER — MIRABEGRON ER 50 MG PO TB24
50.0000 mg | ORAL_TABLET | Freq: Every evening | ORAL | Status: DC
  Administered 2018-02-14 – 2018-02-18 (×5): 50 mg via ORAL
  Filled 2018-02-14 (×6): qty 1

## 2018-02-14 MED ORDER — MUPIROCIN 2 % EX OINT
1.0000 "application " | TOPICAL_OINTMENT | Freq: Two times a day (BID) | CUTANEOUS | Status: DC
  Administered 2018-02-15 – 2018-02-18 (×7): 1 via NASAL
  Filled 2018-02-14 (×3): qty 22

## 2018-02-14 MED ORDER — PRAVASTATIN SODIUM 40 MG PO TABS
80.0000 mg | ORAL_TABLET | Freq: Every day | ORAL | Status: DC
  Administered 2018-02-15 – 2018-02-18 (×4): 80 mg via ORAL
  Filled 2018-02-14 (×5): qty 2

## 2018-02-14 MED ORDER — GLIPIZIDE ER 5 MG PO TB24
5.0000 mg | ORAL_TABLET | Freq: Every day | ORAL | Status: DC
  Administered 2018-02-15 – 2018-02-17 (×3): 5 mg via ORAL
  Filled 2018-02-14 (×4): qty 1

## 2018-02-14 MED ORDER — FUROSEMIDE 10 MG/ML IJ SOLN
60.0000 mg | Freq: Once | INTRAMUSCULAR | Status: AC
  Administered 2018-02-14: 60 mg via INTRAVENOUS
  Filled 2018-02-14: qty 6

## 2018-02-14 MED ORDER — SODIUM CHLORIDE 0.9% FLUSH: 3.0000 mL | INTRAVENOUS | Status: DC | PRN

## 2018-02-14 MED ORDER — FELODIPINE ER 10 MG PO TB24
10.0000 mg | ORAL_TABLET | Freq: Every day | ORAL | Status: DC
  Administered 2018-02-14 – 2018-02-18 (×5): 10 mg via ORAL
  Filled 2018-02-14 (×5): qty 1

## 2018-02-14 MED ORDER — CHLORHEXIDINE GLUCONATE CLOTH 2 % EX PADS
6.0000 | MEDICATED_PAD | Freq: Every day | CUTANEOUS | Status: DC
  Administered 2018-02-15 – 2018-02-18 (×4): 6 via TOPICAL

## 2018-02-14 MED ORDER — SODIUM CHLORIDE 0.9 % IV SOLN: 250.0000 mL | INTRAVENOUS | Status: DC | PRN

## 2018-02-14 MED ORDER — VITAMIN D 1000 UNITS PO TABS
1000.0000 [IU] | ORAL_TABLET | ORAL | Status: DC
  Administered 2018-02-15 – 2018-02-17 (×3): 1000 [IU] via ORAL
  Filled 2018-02-14 (×4): qty 1

## 2018-02-14 MED ORDER — QUETIAPINE FUMARATE 25 MG PO TABS
25.0000 mg | ORAL_TABLET | Freq: Every day | ORAL | Status: DC
  Administered 2018-02-14 – 2018-02-17 (×4): 25 mg via ORAL
  Filled 2018-02-14 (×5): qty 1

## 2018-02-14 MED ORDER — INSULIN GLARGINE 100 UNIT/ML ~~LOC~~ SOLN
10.0000 [IU] | Freq: Every day | SUBCUTANEOUS | Status: DC
  Administered 2018-02-14 – 2018-02-17 (×4): 10 [IU] via SUBCUTANEOUS
  Filled 2018-02-14 (×5): qty 0.1

## 2018-02-14 MED ORDER — ACETAMINOPHEN 325 MG PO TABS: 650.0000 mg | ORAL_TABLET | Freq: Three times a day (TID) | ORAL | Status: DC | PRN

## 2018-02-14 MED ORDER — METOPROLOL TARTRATE 25 MG PO TABS
25.0000 mg | ORAL_TABLET | Freq: Two times a day (BID) | ORAL | Status: DC
  Administered 2018-02-14 – 2018-02-18 (×8): 25 mg via ORAL
  Filled 2018-02-14 (×9): qty 1

## 2018-02-14 MED ORDER — ONDANSETRON HCL 4 MG/2ML IJ SOLN: 4.0000 mg | Freq: Four times a day (QID) | INTRAMUSCULAR | Status: DC | PRN

## 2018-02-14 MED ORDER — SILVER SULFADIAZINE 1 % EX CREA
1.0000 "application " | TOPICAL_CREAM | Freq: Every day | CUTANEOUS | Status: DC
  Administered 2018-02-15 – 2018-02-18 (×4): 1 via TOPICAL
  Filled 2018-02-14: qty 85

## 2018-02-14 MED ORDER — APIXABAN 5 MG PO TABS
5.0000 mg | ORAL_TABLET | Freq: Two times a day (BID) | ORAL | Status: DC
  Administered 2018-02-14 – 2018-02-18 (×8): 5 mg via ORAL
  Filled 2018-02-14 (×10): qty 1

## 2018-02-14 MED ORDER — TRAMADOL HCL 50 MG PO TABS
50.0000 mg | ORAL_TABLET | Freq: Four times a day (QID) | ORAL | Status: DC | PRN
  Administered 2018-02-15: 50 mg via ORAL
  Filled 2018-02-14: qty 1

## 2018-02-14 MED ORDER — FERROUS SULFATE 325 (65 FE) MG PO TABS
325.0000 mg | ORAL_TABLET | Freq: Every day | ORAL | Status: DC
  Administered 2018-02-15 – 2018-02-18 (×4): 325 mg via ORAL
  Filled 2018-02-14 (×5): qty 1

## 2018-02-14 MED ORDER — FUROSEMIDE 10 MG/ML IJ SOLN
40.0000 mg | Freq: Two times a day (BID) | INTRAMUSCULAR | Status: DC
  Administered 2018-02-14 – 2018-02-15 (×2): 40 mg via INTRAVENOUS
  Filled 2018-02-14 (×2): qty 4

## 2018-02-14 MED ORDER — TRAMADOL HCL 50 MG PO TABS: 100.0000 mg | ORAL_TABLET | Freq: Four times a day (QID) | ORAL | Status: DC | PRN

## 2018-02-14 MED ORDER — TIMOLOL MALEATE 0.5 % OP SOLN
1.0000 [drp] | Freq: Two times a day (BID) | OPHTHALMIC | Status: DC
  Administered 2018-02-15 – 2018-02-18 (×6): 1 [drp] via OPHTHALMIC
  Filled 2018-02-14 (×2): qty 5

## 2018-02-14 NOTE — ED Notes (Signed)
Critical lab-Troponin 0.03 reported to [name]

## 2018-02-14 NOTE — Consult Note (Addendum)
Cardiology Consultation:   Patient ID: [name] [name]; [id number]; 1933-10-29   Admit date: 02/14/2018 Date of Consult: 02/14/2018  Primary Care Provider: Lajean Manes, MD Primary Cardiologist: [name] [name] [name], 04/2017 Primary Electrophysiologist:  n/a   Patient Profile:   [name] [name] is a 84 y.o. female with a hx of CABG in 11/4191 complicated by postop atrial fib>>now persistent or permanent, HTN, HL, DM, GERD, carotid stenosis, PAD status post angioplasty to the left SFA in 02/2012 for non healing foot ulcer, dementia, who is being seen today for the evaluation of SOB at the request of [name] [name] [name].  History of Present Illness:   [name] [name] lives near her daughter. Daughter not present.   She has had some back pain, reportedly that is why she called EMS. She says she was in so much pain that she struggled to turn over and had a hard time getting to the phone.   However, she was SOB on EMS arrival with O2 sats 83% on RA. Pt describes a little LE edema and abd fullness. She does not weigh daily. She feels her DOE has been worse recently.   She denies orthopnea but sleeps on multiple pillows at baseline. She denies PND.   She likes salt, tries to limit it. Does not use a whole lot of salt. Eats frozen foods and canned foods. Does not rinse the vegetables.   Feels she is breathing better now. Initially required BiPAP, but is now on O2 by cannula.    Past Medical History:  Diagnosis Date  . Anemia   . Anxiety   . Arthritis    "right wrist, lower back, maybe my right shoulder" (04/08/2017)  . Atrial fibrillation ([place])   . Bilateral carotid artery stenosis   . Bowel incontinence   . CAD (coronary artery disease)    a. s/p CABG 2012.  [name] Chronic lower back pain    "back is very stiff because of fusion; can't stand up straight more than few moments"  . Closed fracture of unspecified part of neck of femur   . Complication of anesthesia    anesth. meds caused hallucinations   . Cough    no fever, chronic  . Degenerative joint disease   . Dementia   . Depression   . Diverticulitis   . Endometriosis   . Fall at home 05/15/2013  . GERD (gastroesophageal reflux disease)   . Headache   . Hiatal hernia   . History of blood transfusion   . History of wheezing   . HLD (hyperlipidemia)   . Hx of cardiovascular stress test    Lexiscan Myoview (11/15):  Low risk stress nuclear study with a small, mild fixed apical septal perfusion defect. EF is 52% with mild septal hypokinesis. No ischemia.  [name] Hx of echocardiogram    Echo (11/15):  Mod LVH, severe septal hypertrophy (suggestive HCM), no LVOT obstruction, rest gradient 15 mmHg across AV, vigorous LVF, EF 65-70%, no RWMA, Gr 1 DD, Ao sclerosis (no stenosis), mod LAE, mild RAE, mod TR, PASP 42 mmHg  . Hypertension   . Hypertonicity of bladder   . Hypertrophic cardiomyopathy ([place])   . Hypothyroidism   . Lumbago   . Muscle weakness (generalized)   . Obesity   . Osteoarthrosis, unspecified whether generalized or localized, unspecified site   . Progressive dementia with uncertain etiology 7/90/2409   Suspect Lewy body .  [name] Reflux esophagitis   . Stroke [facility]) 2011   "old very small TIA/neurologist"  .  Type II diabetes mellitus ([employer])   . Ulcer    left foot x 2 years sees [name] [name] podiastrist for wound staying same tiny wart appearing areas no drainage changes dressing every 3 days  . Unspecified glaucoma(365.9)   . Urinary incontinence     Past Surgical History:  Procedure Laterality Date  . ABDOMINAL AORTOGRAM W/LOWER EXTREMITY N/A 04/08/2017   Procedure: Abdominal Aortogram w/Lower Extremity;  Surgeon: Waynetta Sandy, MD;  Location: [place] [facility];  Service: Cardiovascular;  Laterality: N/A;  . BACK SURGERY     X2 lower  . BOTOX INJECTION N/A 12/02/2016   Procedure: BOTOX INJECTION;  Surgeon: Bjorn Loser, MD;  Location: [facility];  Service: Urology;  Laterality: N/A;  . CARDIAC CATHETERIZATION    .  CARPAL TUNNEL RELEASE Bilateral   . CATARACT EXTRACTION W/ INTRAOCULAR LENS  IMPLANT, BILATERAL Bilateral   . CHOLECYSTECTOMY    . CORONARY ARTERY BYPASS GRAFT  04/17/2011   LT  internal mammary artery to left anterior descending, saphenous vein graft first diagonal, saphenous  vein graft to obtuse marginal 1, saphenous vein graft to posterior descending  . CYSTOSCOPY N/A 12/02/2016   Procedure: CYSTOSCOPY;  Surgeon: Bjorn Loser, MD;  Location: [facility];  Service: Urology;  Laterality: N/A;  . DILATION AND CURETTAGE OF UTERUS     "for endometrosis"  . DILATION AND CURETTAGE OF UTERUS     ENDOMETRIOSIS  . ENDARTERECTOMY Left 10/01/2015   Procedure: Left CAROTID Endartarectomy;  Surgeon: Rosetta Posner, MD;  Location: [place];  Service: Vascular;  Laterality: Left;  . EYE SURGERY    . HAND SURGERY Right    "for arthritis;"  . JOINT REPLACEMENT    . LASER PHOTO ABLATION Right 01/20/2013   Procedure: LASER PHOTO ABLATION;  Surgeon: Hayden Pedro, MD;  Location: [place];  Service: Ophthalmology;  Laterality: Right;  . LOWER EXTREMITY ANGIOGRAM N/A 03/10/2012   Procedure: LOWER EXTREMITY ANGIOGRAM;  Surgeon: Jettie Booze, MD;  Location: [facility];  Service: Cardiovascular;  Laterality: N/A;  . NEUROPLASTY / TRANSPOSITION MEDIAN NERVE AT CARPAL TUNNEL BILATERAL    . PARTIAL HIP ARTHROPLASTY Left 11/2009   "just the ball"  . PERIPHERAL VASCULAR CATHETERIZATION N/A 05/16/2015   Procedure: Abdominal Aortogram;  Surgeon: Elam Dutch, MD;  Location: [place] [facility];  Service: Cardiovascular;  Laterality: N/A;  . PERIPHERAL VASCULAR CATHETERIZATION Left 05/16/2015   Procedure: Lower Extremity Angiography;  Surgeon: Elam Dutch, MD;  Location: [place] [facility];  Service: Cardiovascular;  Laterality: Left;  . PERIPHERAL VASCULAR INTERVENTION  04/08/2017   Procedure: Peripheral Vascular Intervention;  Surgeon: Waynetta Sandy, MD;  Location: [place] [facility];  Service:  Cardiovascular;;  lt.Popliteal  . POSTERIOR FUSION LUMBAR SPINE     "removed L4-5, S1"  . PTCA  03/10/12   LLE  . RETINAL DETACHMENT SURGERY Right May 2014  . SCLERAL BUCKLE Right 01/20/2013   Procedure: SCLERAL BUCKLE;  Surgeon: Hayden Pedro, MD;  Location: [place];  Service: Ophthalmology;  Laterality: Right;  . TOTAL KNEE ARTHROPLASTY  ~ 2004   right  . TOTAL SHOULDER ARTHROPLASTY Right 10/07/2013   Procedure: TOTAL SHOULDER ARTHROPLASTY;  Surgeon: Alta Corning, MD;  Location: [place];  Service: Orthopedics;  Laterality: Right;     Prior to Admission medications   Medication Sig Start Date End Date Taking? Authorizing Provider  acetaminophen (TYLENOL) 650 MG CR tablet Take 650 mg by mouth every 8 (eight) hours as  needed for pain.   Yes [provider]  amoxicillin-clavulanate (AUGMENTIN) 875-125 MG tablet Take 1 tablet by mouth 2 (two) times daily. For 10 days- started on 02-04-18. 02/04/18  Yes [provider]  apixaban (ELIQUIS) 5 MG TABS tablet Take 1 tablet (5 mg total) by mouth 2 (two) times daily. 05/08/17  Yes Jettie Booze, MD  Ascorbic Acid (VITAMIN C) 1000 MG tablet Take 1,000 mg by mouth 3 (three) times a week.    Yes [provider]  aspirin EC 81 MG EC tablet Take 1 tablet (81 mg total) by mouth daily. 04/09/17  Yes Rhyne, Hulen Shouts, PA-C  cholecalciferol (VITAMIN D) 1000 UNITS tablet Take 1,000 Units by mouth See admin instructions. On Monday, Tuesday, Wednesday, Friday, Saturday.   Yes [provider]  COMBIGAN 0.2-0.5 % ophthalmic solution Place 1 drop into both eyes 2 (two) times daily.  01/09/15  Yes [provider]  Cyanocobalamin (VITAMIN B 12 PO) Take 1 tablet by mouth daily.   Yes [provider]  felodipine (PLENDIL) 10 MG 24 hr tablet Take 1 tablet (10 mg total) by mouth daily. 10/27/17  Yes Jettie Booze, MD  ferrous sulfate 325 (65 FE) MG tablet Take 325 mg by mouth daily with breakfast.   Yes [provider]  FLUoxetine (PROZAC) 20 MG capsule Take 20 mg by mouth every morning.    Yes [provider]  glipiZIDE (GLUCOTROL XL) 5 MG 24 hr tablet Take 5 mg by mouth at bedtime.    Yes [provider]  Insulin Glargine (LANTUS SOLOSTAR) 100 UNIT/ML SOPN Inject 10 Units into the skin at bedtime.    Yes [provider]  levothyroxine (SYNTHROID, LEVOTHROID) 125 MCG tablet Take 125 mcg by mouth daily before breakfast.    Yes [provider]  losartan (COZAAR) 25 MG tablet Take 25 mg by mouth daily.  05/22/16  Yes [provider]  memantine (NAMENDA) 10 MG tablet Take 1 tablet (10 mg total) by mouth 2 (two) times daily. 01/22/17  Yes Dohmeier, Asencion Partridge, MD  metoprolol tartrate (LOPRESSOR) 25 MG tablet Take 25 mg by mouth 2 (two) times daily.     Yes [provider]  mirabegron ER (MYRBETRIQ) 50 MG TB24 Take 50 mg by mouth every evening.   Yes [provider]  pioglitazone (ACTOS) 30 MG tablet Take 30 mg by mouth every morning.    Yes [provider]  pravastatin (PRAVACHOL) 80 MG tablet Take 1 tablet (80 mg total) by mouth daily. 08/12/17  Yes Jettie Booze, MD  QUEtiapine (SEROQUEL) 25 MG tablet TAKE 1 TABLET BY MOUTH AT BEDTIME 01/25/18  Yes Dohmeier, Asencion Partridge, MD  rivastigmine (EXELON) 4.6 mg/24hr Place 1 patch (4.6 mg total) onto the skin daily. 05/13/17  Yes Dohmeier, Asencion Partridge, MD  silver sulfADIAZINE (SILVADENE) 1 % cream Apply 1 application topically daily. 11/19/16  Yes Tuchman, Richard C, DPM  ergocalciferol (VITAMIN D2) 50000 UNITS capsule Take 50,000 Units by mouth 2 (two) times a week. On Thursday and Sunday    [provider]    Inpatient Medications: Scheduled Meds: . amoxicillin-clavulanate  1 tablet Oral BID  . apixaban  5 mg Oral BID  . aspirin  81 mg Oral Daily  . brimonidine-timolol  1 drop Both Eyes BID  . cholecalciferol  1,000 Units Oral See admin instructions  . felodipine  10 mg Oral Daily  .  [START ON 02/15/2018] ferrous sulfate  325 mg Oral Q breakfast  .  FLUoxetine  20 mg Oral q morning - 10a  . furosemide  40 mg Intravenous BID  . glipiZIDE  5 mg Oral QHS  . insulin aspart  0-15 Units Subcutaneous TID WC  . Insulin Glargine  10 Units Subcutaneous QHS  . [START ON 02/15/2018] levothyroxine  125 mcg Oral QAC breakfast  . losartan  25 mg Oral Daily  . memantine  10 mg Oral BID  . metoprolol tartrate  25 mg Oral BID  . mirabegron ER  50 mg Oral QPM  . pioglitazone  30 mg Oral q morning - 10a  . potassium chloride  20 mEq Oral BID  . pravastatin  80 mg Oral Daily  . QUEtiapine  25 mg Oral QHS  . rivastigmine  4.6 mg Transdermal Daily  . silver sulfADIAZINE  1 application Topical Daily  . sodium chloride flush  3 mL Intravenous Q12H  . Vitamin B 12   Oral Daily  . [START ON 02/15/2018] vitamin C  1,000 mg Oral Once per day on Mon Wed Fri   Continuous Infusions: . sodium chloride     PRN Meds: sodium chloride, acetaminophen, ondansetron (ZOFRAN) IV, sodium chloride flush  Allergies:    Allergies  Allergen Reactions  . Amitriptyline Other (See Comments)    hallucinations  . Vesicare [Solifenacin] Other (See Comments)    hallucinations  . Vicodin [Hydrocodone-Acetaminophen] Other (See Comments)    Hallucinations - do NOT verify Vicodin orders  . Polysaccharide Iron Complex Hives, Itching and Rash    Patient can tolerate Ferrous Sulfate    Social History:   Social History   Socioeconomic History  . Marital status: Divorced    Spouse name: Not on file  . Number of children: 2  . Years of education: COLLEGE  . Highest education level: Not on file  Occupational History    Comment: retired Pharmacist, hospital  Social Needs  . Financial resource strain: Not on file  . Food insecurity:    Worry: Not on file    Inability: Not on file  . Transportation needs:    Medical: Not on file    Non-medical: Not on file  Tobacco Use  . Smoking status: Former Smoker    Packs/day: 1.50      Years: 38.00    Pack years: 57.00    Types: Cigarettes    Last attempt to quit: 09/16/1987    Years since quitting: 30.4  . Smokeless tobacco: Never Used  Substance and Sexual Activity  . Alcohol use: No    Alcohol/week: 0.0 oz  . Drug use: No  . Sexual activity: Never  Lifestyle  . Physical activity:    Days per week: Not on file    Minutes per session: Not on file  . Stress: Not on file  Relationships  . Social connections:    Talks on phone: Not on file    Gets together: Not on file    Attends religious service: Not on file    Active member of club or organization: Not on file    Attends meetings of clubs or organizations: Not on file    Relationship status: Not on file  . Intimate partner violence:    Fear of current or ex partner: Not on file    Emotionally abused: Not on file    Physically abused: Not on file    Forced sexual activity: Not on file  Other Topics Concern  . Not on file  Social History Narrative   Patient is  divorced and lives alone.   Patient has two adult children.   Patient is a retired Pharmacist, hospital.   Patient has a college education.   Patient is left-handed.   Patient does not drink any caffeine.    Family History:   Family History  Problem Relation Age of Onset  . Cancer Cousin   . Heart attack Mother   . Hypertension Mother   . Heart attack Father   . Heart disease Father        Before age 35  . Hyperlipidemia Father   . Hypertension Father   . Cancer Paternal Aunt    Family Status:  Family Status  Relation Name Status  . Cousin PATERNAL Alive  . Mother  Deceased at age 14       Massive HEART ATTACK  . Father  Deceased at age 65       HEART ATTACK  . Sister  Alive  . Ethlyn Daniels  (Not Specified)    ROS:  Please see the history of present illness.  All other ROS reviewed and negative.     Physical Exam/Data:   Vitals:   02/14/18 1000 02/14/18 1030 02/14/18 1137 02/14/18 1142  BP: (!) 164/81 (!) 159/84 (!) 165/61   Pulse: 86  80 92   Resp: 18 15 19    Temp:      TempSrc:      SpO2: 100% 100% 100% 97%    Intake/Output Summary (Last 24 hours) at 02/14/2018 1243 Last data filed at 02/14/2018 1133 Gross per 24 hour  Intake -  Output 2100 ml  Net -2100 ml   There is no height or weight on file to calculate BMI.  General:  Well nourished, well developed, in no acute distress HEENT: normal Lymph: no adenopathy Neck: JVD 10 cm Endocrine:  No thryomegaly Vascular: No carotid bruits; 4/4 extremity pulses 2+, without bruits  Cardiac:  normal S1, S2; Irregular rhythm; no murmur  Lungs:  clear to auscultation bilaterally, no wheezing, rhonchi or rales  Abd: soft, nontender, no hepatomegaly  Ext: 1+ bilateral lower extremity edema Musculoskeletal:  No deformities, BUE and BLE strength normal and equal Skin: warm and dry  Neuro:  CNs 2-12 intact, no focal abnormalities noted Psych:  Normal affect   EKG:  The EKG was personally reviewed and demonstrates:  06/02, Atrial fib, HR 123, no acute ischemic changes Telemetry:  Telemetry was personally reviewed and demonstrates:  Atrial fib, rate generally controlled  Relevant CV Studies:  ECHO: ordered  ECHO: 07/2014 - Left ventricle: The cavity size was normal. There was moderate concentric hypertrophy. Severe septal hypertrophy - suggestive of hypertrophic cardiomyopathy. No LVOT obstruction is noted - there is a 15 mmHg rest gradient across the AOV. Systolic function was vigorous. The estimated ejection fraction was in the range of 65% to 70%. Wall motion was normal; there were no regional wall motion abnormalities. Doppler parameters are consistent with abnormal left ventricular relaxation (grade 1 diastolic dysfunction). The E/e&' ratio is between 8-15, suggesting indeterminate LV filling pressure. - Aortic valve: Trileaflet. Sclerosis without stenosis. There was no regurgitation. - Left atrium: The atrium was moderately dilated (40  ml/m2). - Right atrium: The atrium was mildly dilated. - Tricuspid valve: There was moderate regurgitation. - Pulmonary arteries: PA peak pressure: 42 mm Hg (S). Impressions: - LVEF 65-70%, moderate LVH, severe septal hypertrophy concerning for HCM, no significant LVOT gradient, aortic sclerosis with mean gradient of 15 mmHg across the valve. Moderate LAE. Moderate TR, RVSP  42 mmHg.   Laboratory Data:  Chemistry Recent Labs  Lab 02/14/18 0730  NA 139  K 4.4  CL 106  CO2 25  GLUCOSE 173*  BUN 20  CREATININE 1.04*  CALCIUM 9.1  GFRNONAA 49*  GFRAA 57*  ANIONGAP 8    Lab Results  Component Value Date   ALT 19 09/28/2015   AST 25 09/28/2015   ALKPHOS 67 09/28/2015   BILITOT 0.7 09/28/2015   Hematology Recent Labs  Lab 02/14/18 0730  WBC 9.7  RBC 2.87*  HGB 9.8*  HCT 32.6*  MCV 113.6*  MCH 34.1*  MCHC 30.1  RDW 15.6*  PLT 228   Cardiac EnzymesNo results for input(s): TROPONINI in the last 168 hours.  Recent Labs  Lab 02/14/18 0742  TROPIPOC 0.02    BNP Recent Labs  Lab 02/14/18 0730  BNP 591.4*    DDimer No results for input(s): DDIMER in the last 168 hours. TSH:  Lab Results  Component Value Date   TSH 0.072 (L) 04/21/2011   Lipids: Lab Results  Component Value Date   CHOL 157 06/26/2014   HDL 30.80 (L) 06/26/2014   LDLCALC 123 (H) 03/20/2014   LDLDIRECT 73.3 06/26/2014   TRIG 250.0 (H) 06/26/2014   CHOLHDL 5 06/26/2014   HgbA1c: Lab Results  Component Value Date   HGBA1C 7.1 (H) 12/01/2016   Magnesium:  Magnesium  Date Value Ref Range Status  02/14/2018 1.8 1.7 - 2.4 mg/dL Final    Comment:    Performed at [facility], [place] [address] [place], [place] [address]     Radiology/Studies:  Dg Chest Port 1 View  Result Date: 02/14/2018 CLINICAL DATA:  Shortness of breath today. EXAM: PORTABLE CHEST 1 VIEW COMPARISON:  Chest x-rays dated 01/20/2017 and 12/11/2015. FINDINGS: Increased interstitial prominence bilaterally  indicating interstitial edema. More focal dense opacity at the RIGHT lung base, atelectasis versus pneumonia. No pleural effusion or pneumothorax seen. Stable cardiomegaly. Aortic atherosclerosis. No acute appearing osseous abnormality. IMPRESSION: 1. Cardiomegaly with bilateral interstitial prominence, presumably interstitial edema, most likely CHF. 2. Focal confluent opacity at the RIGHT lung base, atelectasis versus pneumonia. If afebrile, most likely atelectasis. Electronically Signed   By: Franki Cabot M.D.   On: 02/14/2018 07:52    Assessment and Plan:   Principal Problem: 1.  Acute diastolic CHF (congestive heart failure), NYHA class 3 (HCC) - agree w/ echo - continue IV Lasix - daily wts, strict I/O - need better compliance as outpt w/ daily wts and low NA diet - agree w/ conversations about goals of care and plans, pt developing the need for 24/7 care.  Otherwise, per IM Active Problems:   Hypertension   CAD (coronary artery disease)   Bilateral carotid artery stenosis   Rhegmatogenous retinal detachment of right eye   Obesity (BMI 30-39.9)   Hypothyroidism   DM (diabetes mellitus), type 2 with peripheral vascular complications (HCC)   Hyperlipemia   Lewy body dementia with behavioral disturbance   Chronic atrial fibrillation (HCC)   Hypertrophic cardiomyopathy (HCC)   Gastroparesis due to DM [facility])   Urinary retention   Vitamin D deficiency   Vitamin B 12 deficiency   Pulmonary hypertension ([place])     For questions or updates, please contact [facility] Please consult [email or web address] for contact info under Cardiology/STEMI.   Signed, Kate Sable, MD  02/14/2018 12:43 PM   The patient was seen and examined, and I agree with the history, physical exam, assessment and plan as  documented above, with modifications as noted below. I have also personally reviewed all relevant documentation, old records, labs, and both radiographic and cardiovascular studies. I have  also independently interpreted old and new ECG's.  84 yr old woman with past medical history significant for CABG, PVD with left SFA stenting, HTN, hyperlipidemia, diabetes, and atrial fibrillation.  She awoke with lower back pain shortly after 5 am. When I asked her about shortness of breath, she said she "has had some". She denies chest pain. She does complain of abdominal distention.  BNP 591 and chest xray demonstrative of CHF.  She was given IV Lasix 60 mg in ED and has put out over 2.1 L and is feeling better.  She was unable to sit up due to back pain which persists.  HR is in 80-90 bpm range. She is hypertensive.  I recommend starting IV Lasix 40 mg bid.  I would consider obtaining xrays of spine to rule out any pathology. She is on ASA, Eliquis, metoprolol, losartan, and pravastatin. She is on amiodarone which I will discontinue as atrial fibrillation appears to be persistent if not permanent. I will review echocardiogram.  The big picture is her living situation and goals of care. She also needs more adherence to a low sodium diet.  Kate Sable, MD, [facility]  02/14/2018 12:45 PM

## 2018-02-14 NOTE — Progress Notes (Signed)
Cervical thoracic and lumbar sacral x-rays reviewed.  No acute fractures no acute changes.  Mostly chronic disease noted.  Will start patient on scheduled acetaminophen every 6 hours.  Will add Ultram 50 mg p.o. every 6 hours as needed moderate pain and Ultram 100 mg p.o. every 6 hours severe pain.

## 2018-02-14 NOTE — Progress Notes (Signed)
Received patient around 1700, she is now settled in room and waiting for dinner. Patient is very forgetful, she is alert and orient to self, place but disoriented to situation.

## 2018-02-14 NOTE — Care Management (Signed)
I spoke to patient's daughter [name] at length.  We discussed the safety not surrounding the patient.  She has a caretaker 5 days a week.  On Saturday the patient's daughter stays with her.  The patient's granddaughter is also involved in her care.  The patient has another daughter who unfortunately lives out of town is unable to give significant assistance.  Her daughter tries to go to most of her doctor visits with her.  Patient's daughter [name] works during the week and patient's week daycare is mostly given by her caretaker, the neighbor, and her granddaughter.  We discussed the possibility of palliative care and additional support at home.  The patient's goal is to stay in her home as long as possible.  I did suggest that p.m. the patient and palliative care have a goals of care discussion so that we can better design care for this patient around her needs.  I did express to [facility] that I expect that her mother will have a continuing downhill course with multiple readmissions and hospitalizations.  [name] expressed an interest in hospice.  I am not sure at this time if the patient qualifies.  We will ask palliative care to look into that.

## 2018-02-14 NOTE — H&P (Addendum)
History and Physical    [name] [name] YPP:[id number] DOB: 08-27-1934 DOA: 02/14/2018  PCP: Lajean Manes, MD (no documented visits back to 2015)  Patient coming from: home vis EMS  I have personally briefly reviewed patient's old medical records in [place]  Chief Complaint: Shortness of breath  HPI: [name] [name] is a 84 y.o. female with medical history significant of atrial fibrillation on anticoagulation, diabetes type 2 with severe peripheral artery disease status post interventions to the left popliteal artery in July 2018, Lewy body dementia followed by neurology, coronary artery disease status post coronary artery bypass graft in 2012, hypothyroidism, Neri retention, vitamin D deficiency, vitamin B12 deficiency and diabetic retinopathy who presents the emergency department complaining of shortness of breath.  She was initially called EMS due to back pain.  EMS noted that she was short of breath her sats were 83% she was put on 100% nonrebreather.  She does not have any oxygen at home.  She was so short of breath that she was unable to speak or give any history she does have a history of atrial fibrillation is on Eliquis.  Her chads 2 vascular score is 8.  She was started on BiPAP therapy and had significant improvement in addition to being giving 60 mg of IV Lasix.  The patient is now able to speak in full sentences.  She is able to give me some had history but it is limited by her dementia.  Gets confused easily does not know the last time she saw her primary care physician Dr. Felipa Eth however in our records it appears that she has not seen him since at least 2015.  He gets to her doctor visits when she is escorted by her caretaker which stays with her 3 days a week.  ED Course: Troponin negative, lactic acid negative, glucose 173, creatinine 1.04, globin 9.8, white blood cell count 9.7, urinalysis remarkable only for small amount of hemoglobin, 900 mL of urine output after Lasix  given.  [place] patient denies nausea, vomiting, chest pain, palpitations, sputum production, cough, abdominal pain, diarrhea, lower extremity edema, rashes, masses, blurry vision and double vision.  Review of Systems: As per HPI otherwise all other systems reviewed and  negative.    Past Medical History:  Diagnosis Date  . Anemia   . Anxiety   . Arthritis    "right wrist, lower back, maybe my right shoulder" (04/08/2017)  . Atrial fibrillation ([place])   . Bilateral carotid artery stenosis   . Bowel incontinence   . CAD (coronary artery disease)    a. s/p CABG 2012.  [name] Chronic lower back pain    "back is very stiff because of fusion; can't stand up straight more than few moments"  . Closed fracture of unspecified part of neck of femur   . Complication of anesthesia    anesth. meds caused hallucinations   . Cough    no fever, chronic  . Degenerative joint disease   . Dementia   . Depression   . Diverticulitis   . Endometriosis   . Fall at home 05/15/2013  . GERD (gastroesophageal reflux disease)   . Headache   . Hiatal hernia   . History of blood transfusion   . History of wheezing   . HLD (hyperlipidemia)   . Hx of cardiovascular stress test    Lexiscan Myoview (11/15):  Low risk stress nuclear study with a small, mild fixed apical septal perfusion defect. EF is 52% with mild  septal hypokinesis. No ischemia.  [name] Hx of echocardiogram    Echo (11/15):  Mod LVH, severe septal hypertrophy (suggestive HCM), no LVOT obstruction, rest gradient 15 mmHg across AV, vigorous LVF, EF 65-70%, no RWMA, Gr 1 DD, Ao sclerosis (no stenosis), mod LAE, mild RAE, mod TR, PASP 42 mmHg  . Hypertension   . Hypertonicity of bladder   . Hypertrophic cardiomyopathy ([place])   . Hypothyroidism   . Lumbago   . Muscle weakness (generalized)   . Obesity   . Osteoarthrosis, unspecified whether generalized or localized, unspecified site   . Progressive dementia with uncertain etiology 12/04/252   Suspect  Lewy body .  [name] Reflux esophagitis   . Stroke [facility]) 2011   "old very small TIA/neurologist"  . Type II diabetes mellitus ([place])   . Ulcer    left foot x 2 years sees dr Little Ishikawa podiastrist for wound staying same tiny wart appearing areas no drainage changes dressing every 3 days  . Unspecified glaucoma(365.9)   . Urinary incontinence     Past Surgical History:  Procedure Laterality Date  . ABDOMINAL AORTOGRAM W/LOWER EXTREMITY N/A 04/08/2017   Procedure: Abdominal Aortogram w/Lower Extremity;  Surgeon: Waynetta Sandy, MD;  Location: [place] [facility];  Service: Cardiovascular;  Laterality: N/A;  . BACK SURGERY     X2 lower  . BOTOX INJECTION N/A 12/02/2016   Procedure: BOTOX INJECTION;  Surgeon: Bjorn Loser, MD;  Location: [facility];  Service: Urology;  Laterality: N/A;  . CARDIAC CATHETERIZATION    . CARPAL TUNNEL RELEASE Bilateral   . CATARACT EXTRACTION W/ INTRAOCULAR LENS  IMPLANT, BILATERAL Bilateral   . CHOLECYSTECTOMY    . CORONARY ARTERY BYPASS GRAFT  04/17/2011   LT  internal mammary artery to left anterior descending, saphenous vein graft first diagonal, saphenous  vein graft to obtuse marginal 1, saphenous vein graft to posterior descending  . CYSTOSCOPY N/A 12/02/2016   Procedure: CYSTOSCOPY;  Surgeon: Bjorn Loser, MD;  Location: [facility];  Service: Urology;  Laterality: N/A;  . DILATION AND CURETTAGE OF UTERUS     "for endometrosis"  . DILATION AND CURETTAGE OF UTERUS     ENDOMETRIOSIS  . ENDARTERECTOMY Left 10/01/2015   Procedure: Left CAROTID Endartarectomy;  Surgeon: Rosetta Posner, MD;  Location: [place];  Service: Vascular;  Laterality: Left;  . EYE SURGERY    . HAND SURGERY Right    "for arthritis;"  . JOINT REPLACEMENT    . LASER PHOTO ABLATION Right 01/20/2013   Procedure: LASER PHOTO ABLATION;  Surgeon: Hayden Pedro, MD;  Location: [place];  Service: Ophthalmology;  Laterality: Right;  . LOWER EXTREMITY ANGIOGRAM N/A 03/10/2012   Procedure: LOWER  EXTREMITY ANGIOGRAM;  Surgeon: Jettie Booze, MD;  Location: [facility];  Service: Cardiovascular;  Laterality: N/A;  . NEUROPLASTY / TRANSPOSITION MEDIAN NERVE AT CARPAL TUNNEL BILATERAL    . PARTIAL HIP ARTHROPLASTY Left 11/2009   "just the ball"  . PERIPHERAL VASCULAR CATHETERIZATION N/A 05/16/2015   Procedure: Abdominal Aortogram;  Surgeon: Elam Dutch, MD;  Location: [place] [facility];  Service: Cardiovascular;  Laterality: N/A;  . PERIPHERAL VASCULAR CATHETERIZATION Left 05/16/2015   Procedure: Lower Extremity Angiography;  Surgeon: Elam Dutch, MD;  Location: [place] [facility];  Service: Cardiovascular;  Laterality: Left;  . PERIPHERAL VASCULAR INTERVENTION  04/08/2017   Procedure: Peripheral Vascular Intervention;  Surgeon: Waynetta Sandy, MD;  Location: [place] [facility];  Service: Cardiovascular;;  lt.Popliteal  .  POSTERIOR FUSION LUMBAR SPINE     "removed L4-5, S1"  . PTCA  03/10/12   LLE  . RETINAL DETACHMENT SURGERY Right May 2014  . SCLERAL BUCKLE Right 01/20/2013   Procedure: SCLERAL BUCKLE;  Surgeon: Hayden Pedro, MD;  Location: [place];  Service: Ophthalmology;  Laterality: Right;  . TOTAL KNEE ARTHROPLASTY  ~ 2004   right  . TOTAL SHOULDER ARTHROPLASTY Right 10/07/2013   Procedure: TOTAL SHOULDER ARTHROPLASTY;  Surgeon: Alta Corning, MD;  Location: [place];  Service: Orthopedics;  Laterality: Right;    Social History   Social History Narrative   Patient is divorced and lives alone.   Patient has two adult children.   Patient is a retired Pharmacist, hospital.   Patient has a college education.   Patient is left-handed.   Patient does not drink any caffeine.     reports that she quit smoking about 30 years ago. Her smoking use included cigarettes. She has a 57.00 pack-year smoking history. She has never used smokeless tobacco. She reports that she does not drink alcohol or use drugs.  Allergies  Allergen Reactions  . Amitriptyline Other (See Comments)     hallucinations  . Vesicare [Solifenacin] Other (See Comments)    hallucinations  . Vicodin [Hydrocodone-Acetaminophen] Other (See Comments)    Hallucinations - do NOT verify Vicodin orders  . Polysaccharide Iron Complex Hives, Itching and Rash    Patient can tolerate Ferrous Sulfate    Family History  Problem Relation Age of Onset  . Cancer Cousin   . Heart attack Mother   . Hypertension Mother   . Heart attack Father   . Heart disease Father        Before age 10  . Hyperlipidemia Father   . Hypertension Father   . Cancer Paternal Aunt     Prior to Admission medications   Medication Sig Start Date End Date Taking? Authorizing Provider  acetaminophen (TYLENOL) 650 MG CR tablet Take 650 mg by mouth every 8 (eight) hours as needed for pain.   Yes [provider]  amoxicillin-clavulanate (AUGMENTIN) 875-125 MG tablet Take 1 tablet by mouth 2 (two) times daily. For 10 days- started on 02-04-18. 02/04/18  Yes [provider]  apixaban (ELIQUIS) 5 MG TABS tablet Take 1 tablet (5 mg total) by mouth 2 (two) times daily. 05/08/17  Yes Jettie Booze, MD  Ascorbic Acid (VITAMIN C) 1000 MG tablet Take 1,000 mg by mouth 3 (three) times a week.    Yes [provider]  aspirin EC 81 MG EC tablet Take 1 tablet (81 mg total) by mouth daily. 04/09/17  Yes Rhyne, Hulen Shouts, PA-C  cholecalciferol (VITAMIN D) 1000 UNITS tablet Take 1,000 Units by mouth See admin instructions. On Monday, Tuesday, Wednesday, Friday, Saturday.   Yes [provider]  COMBIGAN 0.2-0.5 % ophthalmic solution Place 1 drop into both eyes 2 (two) times daily.  01/09/15  Yes [provider]  Cyanocobalamin (VITAMIN B 12 PO) Take 1 tablet by mouth daily.   Yes [provider]  felodipine (PLENDIL) 10 MG 24 hr tablet Take 1 tablet (10 mg total) by mouth daily. 10/27/17  Yes Jettie Booze, MD  ferrous sulfate 325 (65 FE) MG tablet Take 325 mg by mouth daily with  breakfast.   Yes [provider]  FLUoxetine (PROZAC) 20 MG capsule Take 20 mg by mouth every morning.    Yes [provider]  glipiZIDE (GLUCOTROL XL) 5 MG 24  hr tablet Take 5 mg by mouth at bedtime.    Yes [provider]  Insulin Glargine (LANTUS SOLOSTAR) 100 UNIT/ML SOPN Inject 10 Units into the skin at bedtime.    Yes [provider]  levothyroxine (SYNTHROID, LEVOTHROID) 125 MCG tablet Take 125 mcg by mouth daily before breakfast.    Yes [provider]  losartan (COZAAR) 25 MG tablet Take 25 mg by mouth daily.  05/22/16  Yes [provider]  memantine (NAMENDA) 10 MG tablet Take 1 tablet (10 mg total) by mouth 2 (two) times daily. 01/22/17  Yes Dohmeier, Asencion Partridge, MD  metoprolol tartrate (LOPRESSOR) 25 MG tablet Take 25 mg by mouth 2 (two) times daily.     Yes [provider]  mirabegron ER (MYRBETRIQ) 50 MG TB24 Take 50 mg by mouth every evening.   Yes [provider]  pioglitazone (ACTOS) 30 MG tablet Take 30 mg by mouth every morning.    Yes [provider]  pravastatin (PRAVACHOL) 80 MG tablet Take 1 tablet (80 mg total) by mouth daily. 08/12/17  Yes Jettie Booze, MD  QUEtiapine (SEROQUEL) 25 MG tablet TAKE 1 TABLET BY MOUTH AT BEDTIME 01/25/18  Yes Dohmeier, Asencion Partridge, MD  rivastigmine (EXELON) 4.6 mg/24hr Place 1 patch (4.6 mg total) onto the skin daily. 05/13/17  Yes Dohmeier, Asencion Partridge, MD  silver sulfADIAZINE (SILVADENE) 1 % cream Apply 1 application topically daily. 11/19/16  Yes Tuchman, Richard C, DPM  ergocalciferol (VITAMIN D2) 50000 UNITS capsule Take 50,000 Units by mouth 2 (two) times a week. On Thursday and Sunday    [provider]  amiodarone (PACERONE) 200 MG tablet Take 200 mg by mouth 3 (three) times a week.   12/02/11  [provider]    Physical Exam:  Constitutional: Acutely ill white female lying in stretcher on BiPAP states feels more comfortable than prior Vitals:    02/14/18 0801 02/14/18 0815 02/14/18 0845 02/14/18 0900  BP:  (!) 158/72 (!) 143/69 (!) 158/85  Pulse:  93 77 73  Resp:  (!) 21 18 19   Temp: 98.4 F (36.9 C)     TempSrc: Oral     SpO2:  100% 99% 100%   Eyes: PERRL, lids and conjunctivae normal ENMT: Mucous membranes are moist. Posterior pharynx clear of any exudate or lesions.Normal dentition.  Neck: normal, supple, no masses, no thyromegaly Respiratory: Coarse breath sounds bilaterally with crackles halfway up no wheezing is appreciated no rhonchi..  Increased respiratory effort respiratory effort.  Increased accessory muscle use.  Cardiovascular: Irregularly irregular rate and rhythm, no murmurs / rubs / gallops. No extremity edema. pulses difficult to palpate, no carotid bruits.  JVD to the angle of the jaw, and positive hepatojugular reflux Abdomen: no tenderness, no masses palpated. No hepatosplenomegaly. Bowel sounds positive.  Musculoskeletal: no clubbing / cyanosis. No joint deformity upper and lower extremities. Good ROM, no contractures. Normal muscle tone.  Skin: Right lateral firm erythema of the lower extremities consistent with arterial disease with areas of ecchymoses and eschars, lesions, ulcers. No induration Neurologic: CN 2-12 grossly intact. Sensation intact, DTR normal. Strength 5/5 in all 4.  Psychiatric: Normal judgment and insight. Alert and oriented x 3. Normal mood.     Labs on Admission: I have personally reviewed following labs and imaging studies  CBC: Recent Labs  Lab 02/14/18 0730  WBC 9.7  NEUTROABS 6.5  HGB 9.8*  HCT 32.6*  MCV 113.6*  PLT 810   Basic Metabolic Panel: Recent Labs  Lab  02/14/18 0730  NA 139  K 4.4  CL 106  CO2 25  GLUCOSE 173*  BUN 20  CREATININE 1.04*  CALCIUM 9.1   Urine analysis:    Component Value Date/Time   COLORURINE STRAW (A) 02/14/2018 [place] 02/14/2018 0934   LABSPEC 1.006 02/14/2018 0934   PHURINE 6.0 02/14/2018 0934   GLUCOSEU  NEGATIVE 02/14/2018 0934   HGBUR SMALL (A) 02/14/2018 0934   BILIRUBINUR NEGATIVE 02/14/2018 0934   KETONESUR NEGATIVE 02/14/2018 0934   PROTEINUR NEGATIVE 02/14/2018 0934   UROBILINOGEN 0.2 05/05/2015 1202   NITRITE NEGATIVE 02/14/2018 0934   LEUKOCYTESUR NEGATIVE 02/14/2018 0934    Radiological Exams on Admission: Dg Chest Port 1 View  Result Date: 02/14/2018 CLINICAL DATA:  Shortness of breath today. EXAM: PORTABLE CHEST 1 VIEW COMPARISON:  Chest x-rays dated 01/20/2017 and 12/11/2015. FINDINGS: Increased interstitial prominence bilaterally indicating interstitial edema. More focal dense opacity at the RIGHT lung base, atelectasis versus pneumonia. No pleural effusion or pneumothorax seen. Stable cardiomegaly. Aortic atherosclerosis. No acute appearing osseous abnormality. IMPRESSION: 1. Cardiomegaly with bilateral interstitial prominence, presumably interstitial edema, most likely CHF. 2. Focal confluent opacity at the RIGHT lung base, atelectasis versus pneumonia. If afebrile, most likely atelectasis. Electronically Signed   By: Franki Cabot M.D.   On: 02/14/2018 07:52    EKG: Independently reviewed.  Voltage with atrial fibrillation and old anterior septal infarct when compared with May 12, 2017 there is no change Assessment/Plan Principal Problem:   Acute diastolic CHF (congestive heart failure), NYHA class 3 (HCC) Active Problems:   Chronic atrial fibrillation (HCC)   Hypertrophic cardiomyopathy ([place])   Pulmonary hypertension (HCC)   CAD (coronary artery disease)   DM (diabetes mellitus), type 2 with peripheral vascular complications ([place])   Lewy body dementia with behavioral disturbance   Hypertension   Bilateral carotid artery stenosis   Rhegmatogenous retinal detachment of right eye   Obesity (BMI 30-39.9)   Hypothyroidism   Hyperlipemia   Gastroparesis due to DM [facility])   Urinary retention   Vitamin D deficiency   Vitamin B 12 deficiency   1.  Acute diastolic  congestive heart failure New York Heart Association association class III: Patient has been living on her own and she has some assistance at home her daughter lives out of town.  Unsure whether she has been able to take her medications regularly.  She is not on any Lasix at home.  Take an ARB and beta-blocker.  She is anticoagulated for her atrial fibrillation.  We will admit her into the hospital and start her on 40 of Lasix twice daily.  Will consult cardiology.  Will obtain echocardiogram as her last one was in 2015.  2.  Chronic atrial fibrillation: Currently fairly rate controlled when she was started on BiPAP.  She remains in atrial fibrillation and is on Eliquis for anticoagulation at home.  3.  Hypertrophic cardiomyopathy: Was last noted in 2015 on echocardiogram.  Was not described as obstructive.  Continue treatment for diastolic dysfunction should improve hemodynamics.  4.  Pulmonary hypertension: Related to diastolic congestive heart failure.  Continue diuresis patient will likely need Lasix at discharge.  Await reports of echocardiogram.  5.  Coronary artery disease: Status post coronary artery bypass graft in 2012.  Patient had a negative stress test in late 2015.  Further work-up per cardiology.  6.  Diabetes mellitus type 2 with peripheral vascular complications on chronic insulin therapy: Continue home diabetes medications we will check fingerstick  blood glucoses before meals and at bedtime provide coverage.  Continue to monitor.  7.  Lewy body dementia with behavioral disturbance as noted by Dr. Brett Fairy and her notes in August 2018.  Currently patient is on Exelon patch as well as Namenda.  Has a caretaker 3 days a week from best I can gather and has some home health as well.  Has a daughter and granddaughter who provides care several days a week from 9 AM to 2 PM.  8.  Hypertension: Continue home medications.  9.  Bilateral carotid artery stenosis status post carotid  endarterectomy in January 2017.  10.  Retinal detachment of right eye: Related to her diabetes continue drops.  11.  Obesity with a BMI of 30-39: Continue dietary counseling.  12.  Hypothyroidism: We will check a TSH continue Synthroid.  13.  Hyperlipidemia: Continue pravastatin.  14.  Gastroparesis due to diabetes mellitus: Continue home medications monitor diet closely consider nutrition consult.  15.  Urinary retention: Patient is on Myrbetriq this is a medication that is known to cause shortness of breath.  It may not be the best medication for her urinary retention.  I would recommend discontinuing the Myrbetriq if the patient remains short of breath once her heart failure is treated and she is at her dry weight.  16.  Vitamin D deficiency: Continue vitamin D therapy.  17 vitamin B12 deficiency: Continue vitamin B12 therapy orally.    DVT prophylaxis: On Eliquis Code Status: Full code: Should be addressed.  May require palliative care consultation. Family Communication: No family present.  Will attempt to reach patient's daughter. Disposition Plan: Home or skilled facility pending evaluation of support services at home. Consults called: Dr. Algernon Huxley with cardiology Admission status: Inpatient   Lady Deutscher MD [facility] Pager [phone]  If 7PM-7AM, please contact night-coverage [email or web address] Password [email or web address]  02/14/2018, 10:35 AM   Called daughter [name] at area code [phone] left a message to call me that her cell phone number.  Also tried her home number at [phone].

## 2018-02-14 NOTE — ED Notes (Signed)
Per Dr. Gilford Raid, ok to not collect second blood culture at this time due to negative istat lactic acid.

## 2018-02-14 NOTE — ED Notes (Signed)
Report from [place], [place].  NS paging admitting MD to see if pt still needs SDU.

## 2018-02-14 NOTE — ED Triage Notes (Signed)
Pt presents for sob with ptar. Initially called out for back pain starting this morning. Wheezing on arrival. Pt on nonrebreather, sats 100%. PTAR reports sats dropped to 83% in route.

## 2018-02-14 NOTE — ED Provider Notes (Signed)
[facility] EMERGENCY DEPARTMENT Provider Note   CSN: [id number] Arrival date & time: 02/14/18  0731     History   Chief Complaint Chief Complaint  Patient presents with  . Shortness of Breath    HPI [name] [name] is a 84 y.o. female.  Pt presents to the ED today with sob.  The pt initially called EMS this morning due to back pain.  EMS noted that pt was very sob.  They put her on a 100% NRB as O2 sats were 83% on RA.  No O2 at home.  She is so sob that she is unable to give much hx.  She does have a hx of afib and is on Eliquis.  CHA2DS2/VAS Stroke Risk Points  Current as of yesterday     8 >= 2 Points: High Risk  1 - 1.99 Points: Medium Risk  0 Points: Low Risk    This is the only CHA2DS2/VAS Stroke Risk Points available for the past  year.:  Last Change: N/A     Details    This score determines the patient's risk of having a stroke if the  patient has atrial fibrillation.       Points Metrics  0 Has Congestive Heart Failure:  No    Current as of yesterday  1 Has Vascular Disease:  Yes    Current as of yesterday  1 Has Hypertension:  Yes    Current as of yesterday  2 Age:  82    Current as of yesterday  1 Has Diabetes:  Yes    Current as of yesterday  2 Had Stroke:  No  Had TIA:  Yes  Had thromboembolism:  No    Current as of yesterday  1 Female:  Yes    Current as of yesterday              Past Medical History:  Diagnosis Date  . Anemia   . Anxiety   . Arthritis    "right wrist, lower back, maybe my right shoulder" (04/08/2017)  . Atrial fibrillation ([place])   . Bilateral carotid artery stenosis   . Bowel incontinence   . CAD (coronary artery disease)    a. s/p CABG 2012.  [name] Chronic lower back pain    "back is very stiff because of fusion; can't stand up straight more than few moments"  . Closed fracture of unspecified part of neck of femur   . Complication of anesthesia    anesth. meds caused hallucinations   . Cough    no  fever, chronic  . Degenerative joint disease   . Dementia   . Depression   . Diverticulitis   . Endometriosis   . Fall at home 05/15/2013  . GERD (gastroesophageal reflux disease)   . Headache   . Hiatal hernia   . History of blood transfusion   . History of wheezing   . HLD (hyperlipidemia)   . Hx of cardiovascular stress test    Lexiscan Myoview (11/15):  Low risk stress nuclear study with a small, mild fixed apical septal perfusion defect. EF is 52% with mild septal hypokinesis. No ischemia.  [name] Hx of echocardiogram    Echo (11/15):  Mod LVH, severe septal hypertrophy (suggestive HCM), no LVOT obstruction, rest gradient 15 mmHg across AV, vigorous LVF, EF 65-70%, no RWMA, Gr 1 DD, Ao sclerosis (no stenosis), mod LAE, mild RAE, mod TR, PASP 42 mmHg  . Hypertension   . Hypertonicity  of bladder   . Hypertrophic cardiomyopathy ([place])   . Hypothyroidism   . Lumbago   . Muscle weakness (generalized)   . Obesity   . Osteoarthrosis, unspecified whether generalized or localized, unspecified site   . Progressive dementia with uncertain etiology 9/75/3005   Suspect Lewy body .  [name] Reflux esophagitis   . Stroke [facility]) 2011   "old very small TIA/neurologist"  . Type II diabetes mellitus ([place])   . Ulcer    left foot x 2 years sees dr Little Ishikawa podiastrist for wound staying same tiny wart appearing areas no drainage changes dressing every 3 days  . Unspecified glaucoma(365.9)   . Urinary incontinence     Patient Active Problem List   Diagnosis Date Noted  . PAD (peripheral artery disease) ([place]) 04/08/2017  . Late onset Alzheimer's disease without behavioral disturbance 02/12/2016  . Atrial fibrillation ([place]) 02/12/2016  . Gastroparesis due to DM ([place]) 02/12/2016  . Diabetes mellitus due to underlying condition with diabetic retinopathy 02/12/2016  . Carotid stenosis 10/01/2015  . Abnormality of gait 08/13/2015  . Hypertrophic cardiomyopathy ([place]) 08/30/2014  . Other specified transient  cerebral ischemias 08/02/2014  . Chronic atrial fibrillation ([place]) 08/02/2014  . Lewy body dementia with behavioral disturbance 02/21/2014  . Peripheral vascular disease ([place]) 01/18/2014  . [place] post total shoulder arthroplasty 12/15/2013  . DM2 (diabetes mellitus, type 2) ([place]) 12/08/2013  . Constipation 12/08/2013  . Hyperlipemia 12/08/2013  . Dementia 12/08/2013  . Unspecified hypothyroidism 10/30/2013  . Osteoarthritis of right shoulder 10/07/2013  . Fall at home 05/15/2013  . Moderate vision impairment-both eyes 05/15/2013  . Progressive dementia with uncertain etiology 07/17/1116  . Obesity (BMI 30-39.9) 05/15/2013  . Rhegmatogenous retinal detachment of right eye 01/10/2013  . Hypertension   . Thyroid disease   . Depression   . IDDM (insulin dependent diabetes mellitus) ([place])   . GERD (gastroesophageal reflux disease)   . CAD (coronary artery disease)   . Bilateral carotid artery stenosis   . Obesity   . Degenerative joint disease   . Hiatal hernia     Past Surgical History:  Procedure Laterality Date  . ABDOMINAL AORTOGRAM W/LOWER EXTREMITY N/A 04/08/2017   Procedure: Abdominal Aortogram w/Lower Extremity;  Surgeon: Waynetta Sandy, MD;  Location: [facility] CV LAB;  Service: Cardiovascular;  Laterality: N/A;  . BACK SURGERY     X2 lower  . BOTOX INJECTION N/A 12/02/2016   Procedure: BOTOX INJECTION;  Surgeon: Bjorn Loser, MD;  Location: [facility] OR[facility];  Service: Urology;  Laterality: N/A;  . CARDIAC CATHETERIZATION    . CARPAL TUNNEL RELEASE Bilateral   . CATARACT EXTRACTION W/ INTRAOCULAR LENS  IMPLANT, BILATERAL Bilateral   . CHOLECYSTECTOMY    . CORONARY ARTERY BYPASS GRAFT  04/17/2011   LT  internal mammary artery to left anterior descending, saphenous vein graft first diagonal, saphenous  vein graft to obtuse marginal 1, saphenous vein graft to posterior descending  . CYSTOSCOPY N/A 12/02/2016   Procedure: CYSTOSCOPY;  Surgeon: Bjorn Loser, MD;   Location: [facility];  Service: Urology;  Laterality: N/A;  . DILATION AND CURETTAGE OF UTERUS     "for endometrosis"  . DILATION AND CURETTAGE OF UTERUS     ENDOMETRIOSIS  . ENDARTERECTOMY Left 10/01/2015   Procedure: Left CAROTID Endartarectomy;  Surgeon: Rosetta Posner, MD;  Location: [facility];  Service: Vascular;  Laterality: Left;  . EYE SURGERY    . HAND SURGERY Right    "for arthritis;"  . JOINT REPLACEMENT    .  LASER PHOTO ABLATION Right 01/20/2013   Procedure: LASER PHOTO ABLATION;  Surgeon: Hayden Pedro, MD;  Location: [facility];  Service: Ophthalmology;  Laterality: Right;  . LOWER EXTREMITY ANGIOGRAM N/A 03/10/2012   Procedure: LOWER EXTREMITY ANGIOGRAM;  Surgeon: Jettie Booze, MD;  Location: [facility] CATH LAB;  Service: Cardiovascular;  Laterality: N/A;  . NEUROPLASTY / TRANSPOSITION MEDIAN NERVE AT CARPAL TUNNEL BILATERAL    . PARTIAL HIP ARTHROPLASTY Left 11/2009   "just the ball"  . PERIPHERAL VASCULAR CATHETERIZATION N/A 05/16/2015   Procedure: Abdominal Aortogram;  Surgeon: Elam Dutch, MD;  Location: [place] [facility] CV LAB;  Service: Cardiovascular;  Laterality: N/A;  . PERIPHERAL VASCULAR CATHETERIZATION Left 05/16/2015   Procedure: Lower Extremity Angiography;  Surgeon: Elam Dutch, MD;  Location: [facility] CV LAB;  Service: Cardiovascular;  Laterality: Left;  . PERIPHERAL VASCULAR INTERVENTION  04/08/2017   Procedure: Peripheral Vascular Intervention;  Surgeon: Waynetta Sandy, MD;  Location: [facility] CV LAB;  Service: Cardiovascular;;  lt.Popliteal  . POSTERIOR FUSION LUMBAR SPINE     "removed L4-5, S1"  . PTCA  03/10/12   LLE  . RETINAL DETACHMENT SURGERY Right May 2014  . SCLERAL BUCKLE Right 01/20/2013   Procedure: SCLERAL BUCKLE;  Surgeon: Hayden Pedro, MD;  Location: [facility];  Service: Ophthalmology;  Laterality: Right;  . TOTAL KNEE ARTHROPLASTY  ~ 2004   right  . TOTAL SHOULDER ARTHROPLASTY Right 10/07/2013   Procedure: TOTAL SHOULDER ARTHROPLASTY;   Surgeon: Alta Corning, MD;  Location: [facility];  Service: Orthopedics;  Laterality: Right;     OB History   None      Home Medications    Prior to Admission medications   Medication Sig Start Date End Date Taking? Authorizing Provider  acetaminophen (TYLENOL) 650 MG CR tablet Take 650 mg by mouth every 8 (eight) hours as needed for pain.   Yes [provider]  amoxicillin-clavulanate (AUGMENTIN) 875-125 MG tablet Take 1 tablet by mouth 2 (two) times daily. For 10 days- started on 02-04-18. 02/04/18  Yes [provider]  apixaban (ELIQUIS) 5 MG TABS tablet Take 1 tablet (5 mg total) by mouth 2 (two) times daily. 05/08/17  Yes Jettie Booze, MD  Ascorbic Acid (VITAMIN C) 1000 MG tablet Take 1,000 mg by mouth 3 (three) times a week.    Yes [provider]  aspirin EC 81 MG EC tablet Take 1 tablet (81 mg total) by mouth daily. 04/09/17  Yes Rhyne, Hulen Shouts, PA-C  cholecalciferol (VITAMIN D) 1000 UNITS tablet Take 1,000 Units by mouth See admin instructions. On Monday, Tuesday, Wednesday, Friday, Saturday.   Yes [provider]  COMBIGAN 0.2-0.5 % ophthalmic solution Place 1 drop into both eyes 2 (two) times daily.  01/09/15  Yes [provider]  Cyanocobalamin (VITAMIN B 12 PO) Take 1 tablet by mouth daily.   Yes [provider]  felodipine (PLENDIL) 10 MG 24 hr tablet Take 1 tablet (10 mg total) by mouth daily. 10/27/17  Yes Jettie Booze, MD  ferrous sulfate 325 (65 FE) MG tablet Take 325 mg by mouth daily with breakfast.   Yes [provider]  FLUoxetine (PROZAC) 20 MG capsule Take 20 mg by mouth every morning.    Yes [provider]  glipiZIDE (GLUCOTROL XL) 5 MG 24 hr tablet Take 5 mg by mouth at bedtime.    Yes [provider]  Insulin Glargine (LANTUS SOLOSTAR) 100 UNIT/ML SOPN Inject 10 Units into  the skin at bedtime.    Yes [provider]  levothyroxine (SYNTHROID, LEVOTHROID) 125 MCG  tablet Take 125 mcg by mouth daily before breakfast.    Yes [provider]  losartan (COZAAR) 25 MG tablet Take 25 mg by mouth daily.  05/22/16  Yes [provider]  memantine (NAMENDA) 10 MG tablet Take 1 tablet (10 mg total) by mouth 2 (two) times daily. 01/22/17  Yes Dohmeier, Asencion Partridge, MD  metoprolol tartrate (LOPRESSOR) 25 MG tablet Take 25 mg by mouth 2 (two) times daily.     Yes [provider]  mirabegron ER (MYRBETRIQ) 50 MG TB24 Take 50 mg by mouth every evening.   Yes [provider]  pioglitazone (ACTOS) 30 MG tablet Take 30 mg by mouth every morning.    Yes [provider]  pravastatin (PRAVACHOL) 80 MG tablet Take 1 tablet (80 mg total) by mouth daily. 08/12/17  Yes Jettie Booze, MD  QUEtiapine (SEROQUEL) 25 MG tablet TAKE 1 TABLET BY MOUTH AT BEDTIME 01/25/18  Yes Dohmeier, Asencion Partridge, MD  rivastigmine (EXELON) 4.6 mg/24hr Place 1 patch (4.6 mg total) onto the skin daily. 05/13/17  Yes Dohmeier, Asencion Partridge, MD  silver sulfADIAZINE (SILVADENE) 1 % cream Apply 1 application topically daily. 11/19/16  Yes Tuchman, Richard C, DPM  ergocalciferol (VITAMIN D2) 50000 UNITS capsule Take 50,000 Units by mouth 2 (two) times a week. On Thursday and Sunday    [provider]  amiodarone (PACERONE) 200 MG tablet Take 200 mg by mouth 3 (three) times a week.   12/02/11  [provider]    Family History Family History  Problem Relation Age of Onset  . Cancer Cousin   . Heart attack Mother   . Hypertension Mother   . Heart attack Father   . Heart disease Father        Before age 38  . Hyperlipidemia Father   . Hypertension Father   . Cancer Paternal Aunt     Social History Social History   Tobacco Use  . Smoking status: Former Smoker    Packs/day: 1.50    Years: 38.00    Pack years: 57.00    Types: Cigarettes    Last attempt to quit: 09/16/1987    Years since quitting: 30.4  . Smokeless tobacco: Never Used  Substance Use  Topics  . Alcohol use: No    Alcohol/week: 0.0 oz  . Drug use: No     Allergies   Amitriptyline; Vesicare [solifenacin]; Vicodin [hydrocodone-acetaminophen]; and Polysaccharide iron complex   Review of Systems Review of Systems  Respiratory: Positive for shortness of breath.   Musculoskeletal: Positive for back pain.  All other systems reviewed and are negative.    Physical Exam Updated Vital Signs BP (!) 158/85   Pulse 73   Temp 98.4 F (36.9 C) (Oral)   Resp 19   SpO2 100%   Physical Exam  Constitutional: She is oriented to person, place, and time. She appears well-developed and well-nourished. She appears ill. She appears distressed.  HENT:  Head: Normocephalic and atraumatic.  Mouth/Throat: Oropharynx is clear and moist.  Eyes: Pupils are equal, round, and reactive to light. EOM are normal.  Neck: Normal range of motion. Neck supple.  Cardiovascular: An irregularly irregular rhythm present. Tachycardia present.  Pulmonary/Chest: Accessory muscle usage present. Tachypnea noted. She is in respiratory distress. She has wheezes. She has rhonchi.  Abdominal: Soft. Bowel sounds are normal.  Musculoskeletal:       Right lower  leg: She exhibits edema.       Left lower leg: She exhibits edema.  Neurological: She is alert and oriented to person, place, and time.  Skin: Skin is warm. Capillary refill takes less than 2 seconds.  Psychiatric: She has a normal mood and affect. Her behavior is normal.  Nursing note and vitals reviewed.    ED Treatments / Results  Labs (all labs ordered are listed, but only abnormal results are displayed) Labs Reviewed  BASIC METABOLIC PANEL - Abnormal; Notable for the following components:      Result Value   Glucose, Bld 173 (*)    Creatinine, Ser 1.04 (*)    GFR calc non Af Amer 49 (*)    GFR calc Af Amer 57 (*)    All other components within normal limits  CBC WITH DIFFERENTIAL/PLATELET - Abnormal; Notable for the following  components:   RBC 2.87 (*)    Hemoglobin 9.8 (*)    HCT 32.6 (*)    MCV 113.6 (*)    MCH 34.1 (*)    RDW 15.6 (*)    Monocytes Absolute 1.2 (*)    All other components within normal limits  URINALYSIS, ROUTINE W REFLEX MICROSCOPIC - Abnormal; Notable for the following components:   Color, Urine STRAW (*)    Hgb urine dipstick SMALL (*)    All other components within normal limits  CULTURE, BLOOD (ROUTINE X 2)  CULTURE, BLOOD (ROUTINE X 2)  URINE CULTURE  BRAIN NATRIURETIC PEPTIDE  I-STAT TROPONIN, ED  I-STAT CG4 LACTIC ACID, ED    EKG EKG Interpretation  Date/Time:  Sunday February 14 2018 07:31:38 EDT Ventricular Rate:  123 PR Interval:    QRS Duration: 89 QT Interval:  303 QTC Calculation: 403 R Axis:   46 Text Interpretation:  Atrial fibrillation Low voltage, precordial leads Anteroseptal infarct, old Borderline repolarization abnormality afib since last ekg.  Pt does have hx of afib. Confirmed by Isla Pence [phone]) on 02/14/2018 7:43:35 AM   Radiology Dg Chest Port 1 View  Result Date: 02/14/2018 CLINICAL DATA:  Shortness of breath today. EXAM: PORTABLE CHEST 1 VIEW COMPARISON:  Chest x-rays dated 01/20/2017 and 12/11/2015. FINDINGS: Increased interstitial prominence bilaterally indicating interstitial edema. More focal dense opacity at the RIGHT lung base, atelectasis versus pneumonia. No pleural effusion or pneumothorax seen. Stable cardiomegaly. Aortic atherosclerosis. No acute appearing osseous abnormality. IMPRESSION: 1. Cardiomegaly with bilateral interstitial prominence, presumably interstitial edema, most likely CHF. 2. Focal confluent opacity at the RIGHT lung base, atelectasis versus pneumonia. If afebrile, most likely atelectasis. Electronically Signed   By: Franki Cabot M.D.   On: 02/14/2018 07:52    Procedures Procedures (including critical care time)  Medications Ordered in ED Medications  furosemide (LASIX) injection 60 mg (60 mg Intravenous Given 02/14/18  0805)     Initial Impression / Assessment and Plan / ED Course  I have reviewed the triage vital signs and the nursing notes.  Pertinent labs & imaging results that were available during my care of the patient were reviewed by me and considered in my medical decision making (see chart for details).     CRITICAL CARE Performed by: Isla Pence   Total critical care time: 30 minutes  Critical care time was exclusive of separately billable procedures and treating other patients.  Critical care was necessary to treat or prevent imminent or life-threatening deterioration.  Critical care was time spent personally by me on the following activities: development of treatment plan with patient  and/or surrogate as well as nursing, discussions with consultants, evaluation of patient's response to treatment, examination of patient, obtaining history from patient or surrogate, ordering and performing treatments and interventions, ordering and review of laboratory studies, ordering and review of radiographic studies, pulse oximetry and re-evaluation of patient's condition.  After bipap, pt's breathing significantly improved.  Pt d/w Dr. Evangeline Gula (triad) for admission.  Final Clinical Impressions(s) / ED Diagnoses   Final diagnoses:  Congestive heart failure, unspecified HF chronicity, unspecified heart failure type ([place])  Paroxysmal atrial fibrillation ([place])  Acute respiratory failure with hypoxia [facility])    ED Discharge Orders    None       Isla Pence, MD 02/14/18 [phone]

## 2018-02-15 ENCOUNTER — Other Ambulatory Visit (HOSPITAL_COMMUNITY): Payer: Medicare Other

## 2018-02-15 ENCOUNTER — Inpatient Hospital Stay (HOSPITAL_COMMUNITY): Payer: Medicare Other

## 2018-02-15 DIAGNOSIS — J9601 Acute respiratory failure with hypoxia: Secondary | ICD-10-CM | POA: Diagnosis not present

## 2018-02-15 DIAGNOSIS — I2510 Atherosclerotic heart disease of native coronary artery without angina pectoris: Secondary | ICD-10-CM

## 2018-02-15 DIAGNOSIS — E1151 Type 2 diabetes mellitus with diabetic peripheral angiopathy without gangrene: Secondary | ICD-10-CM | POA: Diagnosis not present

## 2018-02-15 DIAGNOSIS — E538 Deficiency of other specified B group vitamins: Secondary | ICD-10-CM | POA: Diagnosis not present

## 2018-02-15 DIAGNOSIS — I272 Pulmonary hypertension, unspecified: Secondary | ICD-10-CM | POA: Diagnosis not present

## 2018-02-15 DIAGNOSIS — G3183 Dementia with Lewy bodies: Secondary | ICD-10-CM | POA: Diagnosis not present

## 2018-02-15 DIAGNOSIS — Z66 Do not resuscitate: Secondary | ICD-10-CM | POA: Diagnosis not present

## 2018-02-15 DIAGNOSIS — I5031 Acute diastolic (congestive) heart failure: Secondary | ICD-10-CM | POA: Diagnosis not present

## 2018-02-15 DIAGNOSIS — I110 Hypertensive heart disease with heart failure: Secondary | ICD-10-CM | POA: Diagnosis not present

## 2018-02-15 DIAGNOSIS — I482 Chronic atrial fibrillation: Secondary | ICD-10-CM | POA: Diagnosis not present

## 2018-02-15 DIAGNOSIS — R0602 Shortness of breath: Secondary | ICD-10-CM | POA: Diagnosis not present

## 2018-02-15 DIAGNOSIS — F0281 Dementia in other diseases classified elsewhere with behavioral disturbance: Secondary | ICD-10-CM | POA: Diagnosis not present

## 2018-02-15 DIAGNOSIS — I422 Other hypertrophic cardiomyopathy: Secondary | ICD-10-CM | POA: Diagnosis not present

## 2018-02-15 DIAGNOSIS — I6523 Occlusion and stenosis of bilateral carotid arteries: Secondary | ICD-10-CM | POA: Diagnosis not present

## 2018-02-15 DIAGNOSIS — I10 Essential (primary) hypertension: Secondary | ICD-10-CM | POA: Diagnosis not present

## 2018-02-15 LAB — URINE CULTURE: Culture: NO GROWTH

## 2018-02-15 LAB — BASIC METABOLIC PANEL
Anion gap: 4 — ABNORMAL LOW (ref 5–15)
BUN: 22 mg/dL — AB (ref 6–20)
CO2: 30 mmol/L (ref 22–32)
CREATININE: 1.2 mg/dL — AB (ref 0.44–1.00)
Calcium: 8.6 mg/dL — ABNORMAL LOW (ref 8.9–10.3)
Chloride: 105 mmol/L (ref 101–111)
GFR calc Af Amer: 48 mL/min — ABNORMAL LOW (ref >60)
GFR, EST NON AFRICAN AMERICAN: 41 mL/min — AB (ref >60)
GLUCOSE: 174 mg/dL — AB (ref 65–99)
Potassium: 4.2 mmol/L (ref 3.5–5.1)
SODIUM: 139 mmol/L (ref 135–145)

## 2018-02-15 LAB — GLUCOSE, CAPILLARY
GLUCOSE-CAPILLARY: 199 mg/dL — AB (ref 65–99)
Glucose-Capillary: 241 mg/dL — ABNORMAL HIGH (ref 65–99)
Glucose-Capillary: 123 mg/dL — ABNORMAL HIGH (ref 65–99)
Glucose-Capillary: 254 mg/dL — ABNORMAL HIGH (ref 65–99)

## 2018-02-15 MED ORDER — FUROSEMIDE 40 MG PO TABS
40.0000 mg | ORAL_TABLET | Freq: Every day | ORAL | Status: DC
  Administered 2018-02-16 – 2018-02-18 (×3): 40 mg via ORAL
  Filled 2018-02-15 (×3): qty 1

## 2018-02-15 NOTE — Progress Notes (Signed)
Initial Nutrition Assessment  DOCUMENTATION CODES:   Obesity unspecified  INTERVENTION:   -MVI with minerals daily  NUTRITION DIAGNOSIS:   Increased nutrient needs related to wound healing as evidenced by estimated needs.  GOAL:   Patient will meet greater than or equal to 90% of their needs  MONITOR:   PO intake, Supplement acceptance, Labs, Weight trends, Skin, I & O's  REASON FOR ASSESSMENT:   Consult Assessment of nutrition requirement/status  ASSESSMENT:   [name] [name] is a 84 y.o. female with medical history significant of atrial fibrillation on anticoagulation, diabetes type 2 with severe peripheral artery disease status post interventions to the left popliteal artery in July 2018, Lewy body dementia followed by neurology, coronary artery disease status post coronary artery bypass graft in 2012, hypothyroidism, Neri retention, vitamin D deficiency, vitamin B12 deficiency and diabetic retinopathy who presents the emergency department complaining of shortness of breath  Pt admitted with CHF.    Palliative care consult pending.   Attempted to see pt x 3, however, unavailable at times of visits.   Pt with good appetite; noted meal completion 100%.   Due to cognitive impairment, pt not appropriate for education.   Reviewed wt hx; UBW around 180-190#. Pt has experienced a 4.8% wt loss over the past year, which is not significant for time frame.   Last A1c: 5.7 (02/14/18). PTA DM medications 5 mg glipizide daily and 10 units insulin glargine q HS).   Labs reviewed: CBGS: 199 (inpatient orders for glycemic control are 5 mg glipizide q HS, 0-15 units insulin aspart TID with meals, and 10 units insulin glargine q HS).   Diet Order:   Diet Order           Diet heart healthy/carb modified Room service appropriate? Yes; Fluid consistency: Thin  Diet effective now          EDUCATION NEEDS:   Not appropriate for education at this time  Skin:  Skin Assessment: Skin  Integrity Issues: Skin Integrity Issues:: Other (Comment) Other: lt shin venous stasis ulcers  Last BM:  02/13/18  Height:   Ht Readings from Last 1 Encounters:  02/14/18 5' (1.524 m)    Weight:   Wt Readings from Last 1 Encounters:  02/15/18 178 lb 6.4 oz (80.9 kg)    Ideal Body Weight:  45.5 kg  BMI:  Body mass index is 34.84 kg/m.  Estimated Nutritional Needs:   Kcal:  1600-1800  Protein:  80-95 grams  Fluid:  1.6-1.8 L    Rafik Koppel A. Jimmye Norman, RD, LDN, CDE Pager: [phone] After hours Pager: [phone]

## 2018-02-15 NOTE — Progress Notes (Signed)
  Echocardiogram 2D Echocardiogram has been attempted. Patient eating lunch and entertaining visitors. Patient asked to return at another time.  [name] [name] G [name] [name] 02/15/2018, 1:25 PM

## 2018-02-15 NOTE — Evaluation (Signed)
Physical Therapy Evaluation Patient Details Name: [name] [name] MRN: [id number] DOB: 1933/11/22 Today's Date: 02/15/2018   History of Present Illness  [name] [name] is a 84 y.o. female with medical history significant of atrial fibrillation on anticoagulation, diabetes type 2 with severe peripheral artery disease status post interventions to the left popliteal artery in July 2018, Lewy body dementia followed by neurology, coronary artery disease status post coronary artery bypass graft in 2012, hypothyroidism, Neri retention, vitamin D deficiency, vitamin B12 deficiency and diabetic retinopathy who presents the emergency department complaining of shortness of breath.  She was initially called EMS due to back pain.  EMS noted that she was short of breath her sats were 83% she was put on 100% nonrebreather.  She does not have any oxygen at home.   Clinical Impression  Pt very pleasant with minimal complaints with the exception of mild R LE pain. Pt oriented however demo's impaired memory and safety awareness in addition to increased fall risk. Pt unsafe to be home alone. Per patient she has a caregiver, Manuela Schwartz, from 10am-3pm M-F, but not 24/7 assist. Due to both physical and cognitive impairments pt unsafe to be home alone and would need 24/7 assist for safe transition home.    Follow Up Recommendations SNF or memory care unit;Supervision/Assistance - 24 hour(unless 24/7 assist can be provided at home)    Equipment Recommendations  None recommended by PT(TBD at next venue)    Recommendations for Other Services       Precautions / Restrictions Precautions Precautions: Fall Precaution Comments: tangental speech Restrictions Weight Bearing Restrictions: No      Mobility  Bed Mobility Overal bed mobility: Needs Assistance Bed Mobility: Supine to Sit     Supine to sit: Min assist;Mod assist     General bed mobility comments: max directional v/c's, modA to bring hips to EOB, minA for trunk  elevation, bed rail used, HOB elevated  Transfers Overall transfer level: Needs assistance Equipment used: Rolling walker (2 wheeled) Transfers: Sit to/from Stand Sit to Stand: Min assist         General transfer comment: v/c's to push up from bed, increased time, minA to steady during transition of hands,  Ambulation/Gait Ambulation/Gait assistance: Min assist Ambulation Distance (Feet): 40 Feet Assistive device: Rolling walker (2 wheeled) Gait Pattern/deviations: Step-through pattern;Decreased stride length;Trunk flexed Gait velocity: slow Gait velocity interpretation: <1.8 ft/sec, indicate of risk for recurrent falls General Gait Details: minA for walker management, onset of fatigue, easily distracted, interested in people in other rooms  Stairs            Wheelchair Mobility    Modified Rankin (Stroke Patients Only)       Balance Overall balance assessment: Needs assistance Sitting-balance support: No upper extremity supported;Feet supported Sitting balance-Leahy Scale: Fair     Standing balance support: Bilateral upper extremity supported Standing balance-Leahy Scale: Poor Standing balance comment: dependent on RW                             Pertinent Vitals/Pain Pain Assessment: No/denies pain    Home Living Family/patient expects to be discharged to:: Private residence Living Arrangements: Alone Available Help at Discharge: Family;Personal care attendant;Available PRN/intermittently Type of Home: House Home Access: Stairs to enter Entrance Stairs-Rails: Right;Left;Can reach both Entrance Stairs-Number of Steps: 5 Home Layout: One level Home Equipment: Walker - 2 wheels;Shower seat;Grab bars - tub/shower;Grab bars - toilet Additional Comments: has [facility] 10-3pm,  M-F    Prior Function Level of Independence: Needs assistance   Gait / Transfers Assistance Needed: uses RW  ADL's / Homemaking Assistance Needed: caregiver present for  dressing/bathing however pt reports she can do it on her own  Comments: aide makes her breakfast     Hand Dominance   Dominant Hand: Left    Extremity/Trunk Assessment   Upper Extremity Assessment Upper Extremity Assessment: Generalized weakness    Lower Extremity Assessment Lower Extremity Assessment: Generalized weakness    Cervical / Trunk Assessment Cervical / Trunk Assessment: Normal  Communication   Communication: No difficulties  Cognition Arousal/Alertness: Awake/alert Behavior During Therapy: WFL for tasks assessed/performed Overall Cognitive Status: No family/caregiver present to determine baseline cognitive functioning                                 General Comments: pt appears coherent and oriented however pt very distracted with tangental speech and impaired short term memory      General Comments General comments (skin integrity, edema, etc.): assisted to [facility], modA for walker management during turning, max directional v/c's. pt able to urinate but didn't realize she did    Exercises     Assessment/Plan    PT Assessment Patient needs continued PT services  PT Problem List Decreased activity tolerance;Decreased balance;Decreased mobility;Decreased coordination;Decreased cognition;Decreased knowledge of use of DME;Decreased safety awareness       PT Treatment Interventions DME instruction;Gait training;Functional mobility training;Therapeutic activities;Therapeutic exercise;Stair training;Balance training;Cognitive remediation;Patient/family education    PT Goals (Current goals can be found in the Care Plan section)  Acute Rehab PT Goals Patient Stated Goal: didn't state PT Goal Formulation: Patient unable to participate in goal setting Time For Goal Achievement: 03/01/18 Potential to Achieve Goals: Good    Frequency Min 3X/week   Barriers to discharge Decreased caregiver support per patient, only has home health aide from 10a-3pm     Co-evaluation               AM-PAC PT "6 Clicks" Daily Activity  Outcome Measure Difficulty turning over in bed (including adjusting bedclothes, sheets and blankets)?: Unable Difficulty moving from lying on back to sitting on the side of the bed? : Unable Difficulty sitting down on and standing up from a chair with arms (e.g., wheelchair, bedside commode, etc,.)?: Unable Help needed moving to and from a bed to chair (including a wheelchair)?: A Little Help needed walking in hospital room?: A Little Help needed climbing 3-5 steps with a railing? : A Lot 6 Click Score: 11    End of Session Equipment Utilized During Treatment: Gait belt Activity Tolerance: Patient limited by fatigue Patient left: in chair;with chair alarm set;with call bell/phone within reach Nurse Communication: Mobility status PT Visit Diagnosis: Unsteadiness on feet (R26.81)    Time: 2694-8546 PT Time Calculation (min) (ACUTE ONLY): 41 min   Charges:   PT Evaluation $PT Eval Moderate Complexity: 1 Mod PT Treatments $Gait Training: 8-22 mins $Therapeutic Activity: 8-22 mins   PT G Codes:        Kittie Plater, PT, DPT Pager #: [phone] Office #: [phone]   [place] 02/15/2018, 12:37 PM

## 2018-02-15 NOTE — Progress Notes (Addendum)
Progress Note    SHANNETTE [name]  WVP:[id number] DOB: 01/14/1934  DOA: 02/14/2018 PCP: Lajean Manes, MD    Brief Narrative:    Medical records reviewed and are as summarized below:  [name] [name] is an 84 y.o. female with medical history significant of atrial fibrillation on anticoagulation, diabetes type 2 with severe peripheral artery disease status post interventions to the left popliteal artery in July 2018, Lewy body dementia followed by neurology, coronary artery disease status post coronary artery bypass graft in 2012, hypothyroidism, Neri retention, vitamin D deficiency, vitamin B12 deficiency and diabetic retinopathy who presents the emergency department complaining of shortness of breath.  She was initially called EMS due to back pain.  EMS noted that she was short of breath her sats were 83% she was put on 100% nonrebreather.  She does not have any oxygen at home.  She was so short of breath that she was unable to speak or give any history she does have a history of atrial fibrillation is on Eliquis.  Her chads 2 vascular score is 8.  She was started on BiPAP therapy and had significant improvement in addition to being giving 60 mg of IV Lasix.  The patient is now able to speak in full sentences.  She is able to give me some had history but it is limited by her dementia.     Assessment/Plan:   Principal Problem:   Acute diastolic CHF (congestive heart failure), NYHA class 3 (HCC) Active Problems:   Hypertension   CAD (coronary artery disease)   Bilateral carotid artery stenosis   Rhegmatogenous retinal detachment of right eye   Obesity (BMI 30-39.9)   Hypothyroidism   DM (diabetes mellitus), type 2 with peripheral vascular complications (HCC)   Hyperlipemia   Lewy body dementia with behavioral disturbance   Chronic atrial fibrillation (HCC)   Hypertrophic cardiomyopathy (HCC)   Gastroparesis due to DM ([place])   Urinary retention   Vitamin D deficiency   Vitamin B 12  deficiency   Pulmonary hypertension (HCC)  Acute diastolic congestive heart failure New York Heart Association association class III:  -not on any Lasix at home.   -echo pending -not sure how compliant she is with low salt diet -if I/O correct down >4L yesterday  Acute respiratory failure -wean O2 to off if able  Chronic atrial fibrillation:  -rate controlled -Eliquis for anticoagulation at home.  Pulmonary hypertension: Related to diastolic congestive heart failure.  Continue diuresis patient will likely need Lasix at discharge -echo: pending  Coronary artery disease: Status post coronary artery bypass graft in 2012.  Patient had a negative stress test in late 2015.  Further work-up per cardiology.  Diabetes mellitus type 2 with peripheral vascular complications on chronic insulin therapy:  -SSI -will need to STOP actos   Lewy body dementia with behavioral disturbance as noted by Dr. Brett Fairy and her notes in August 2018.  Currently patient is on Exelon patch as well as Namenda.  Has a caretaker 3 days a week from best I can gather and has some home health as well.  Has a daughter and granddaughter who provides care several days a week from 9 AM to 2 PM. -needs 24/7-- social work consult  Hypertension: Continue home medications.   Bilateral carotid artery stenosis status post carotid endarterectomy in January 2017. -has appointment with Dr. Luther Parody office mid June Retinal detachment of right eye: Related to her diabetes  -eye gtts  Obesity with a BMI of  30-39 Body mass index is 34.84 kg/m.  Hypothyroidism:  -TSH normal   Hyperlipidemia: Continue pravastatin.  Urinary retention: Patient is on Myrbetriq this is a medication that is known to cause shortness of breath.  It may not be the best medication for her urinary retention.   -could d/c the Myrbetriq if the patient remains short of breath once her heart failure is treated and she is at her dry  weight.  Vitamin D deficiency: Continue vitamin D therapy.  vitamin B12 deficiency: Continue vitamin B12 therapy orally.    Body mass index is 34.84 kg/m.   Family Communication/Anticipated D/C date and plan/Code Status   DVT prophylaxis: Lovenox ordered. Code Status: Full Code.  Family Communication: none at bedside Disposition Plan: pending eval-- will need 24/7 care at home   Medical Consultants:    cards.    Subjective:   Worried that she may have missed an appointment with Dr. Donnetta Hutching  Objective:    Vitals:   02/14/18 2114 02/15/18 0151 02/15/18 0513 02/15/18 0922  BP: (!) 105/49 130/68 (!) 151/64 (!) 119/49  Pulse: 100 64 81 84  Resp: 20 18 18 18   Temp: 98.2 F (36.8 C) 99.1 F (37.3 C) 98.3 F (36.8 C) 98.4 F (36.9 C)  TempSrc: Oral Oral Oral Oral  SpO2: 98% 96% 92% 95%  Weight:   80.9 kg (178 lb 6.4 oz)   Height:        Intake/Output Summary (Last 24 hours) at 02/15/2018 1414 Last data filed at 02/15/2018 1008 Gross per 24 hour  Intake 340 ml  Output 1850 ml  Net -1510 ml   Filed Weights   02/14/18 1705 02/15/18 0513  Weight: 82.4 kg (181 lb 9.6 oz) 80.9 kg (178 lb 6.4 oz)    Exam: In bed, NAD No increased work of breathing, no wheezing Irregular but rate controlled Knows person but situationally consfused  Data Reviewed:   I have personally reviewed following labs and imaging studies:  Labs: Labs show the following:   Basic Metabolic Panel: Recent Labs  Lab 02/14/18 0730 02/14/18 0804 02/15/18 0427  NA 139  --  139  K 4.4  --  4.2  CL 106  --  105  CO2 25  --  30  GLUCOSE 173*  --  174*  BUN 20  --  22*  CREATININE 1.04*  --  1.20*  CALCIUM 9.1  --  8.6*  MG  --  1.8  --    GFR Estimated Creatinine Clearance: 34.7 mL/min (A) (by C-G formula based on SCr of 1.2 mg/dL (H)). Liver Function Tests: No results for input(s): AST, ALT, ALKPHOS, BILITOT, PROT, ALBUMIN in the last 168 hours. No results for input(s): LIPASE,  AMYLASE in the last 168 hours. No results for input(s): AMMONIA in the last 168 hours. Coagulation profile No results for input(s): INR, PROTIME in the last 168 hours.  CBC: Recent Labs  Lab 02/14/18 0730  WBC 9.7  NEUTROABS 6.5  HGB 9.8*  HCT 32.6*  MCV 113.6*  PLT 228   Cardiac Enzymes: Recent Labs  Lab 02/14/18 1300 02/14/18 1929  TROPONINI 0.03* 0.04*   BNP (last 3 results) No results for input(s): PROBNP in the last 8760 hours. CBG: Recent Labs  Lab 02/14/18 1321 02/14/18 1706 02/14/18 2055 02/15/18 0827 02/15/18 1128  GLUCAP 149* 230* 165* 199* 241*   D-Dimer: No results for input(s): DDIMER in the last 72 hours. Hgb A1c: Recent Labs    02/14/18 1300  HGBA1C 5.7*   Lipid Profile: No results for input(s): CHOL, HDL, LDLCALC, TRIG, CHOLHDL, LDLDIRECT in the last 72 hours. Thyroid function studies: Recent Labs    02/14/18 1300  TSH 1.289   Anemia work up: No results for input(s): VITAMINB12, FOLATE, FERRITIN, TIBC, IRON, RETICCTPCT in the last 72 hours. Sepsis Labs: Recent Labs  Lab 02/14/18 0730 02/14/18 0745  WBC 9.7  --   LATICACIDVEN  --  0.71    Microbiology Recent Results (from the past 240 hour(s))  Urine culture     Status: None   Collection Time: 02/14/18  9:34 AM  Result Value Ref Range Status   Specimen Description URINE, CLEAN CATCH  Final   Special Requests NONE  Final   Culture   Final    NO GROWTH Performed at [facility], [address], [place], [place] [address]    Report Status 02/15/2018 FINAL  Final  MRSA PCR Screening     Status: Abnormal   Collection Time: 02/14/18 10:01 PM  Result Value Ref Range Status   MRSA by PCR POSITIVE (A) NEGATIVE Final    Comment:        The GeneXpert MRSA Assay (FDA approved for NASAL specimens only), is one component of a comprehensive MRSA colonization surveillance program. It is not intended to diagnose MRSA infection nor to guide or monitor treatment for MRSA  infections. RESULT CALLED TO, READ BACK BY AND VERIFIED WITH: RASUL,G RN 02/14/18 AT 2348 SKEEN,P     Procedures and diagnostic studies:  Dg Cervical Spine 2 Or 3 Views  Result Date: 02/14/2018 CLINICAL DATA:  Chronic pain. EXAM: CERVICAL SPINE - 2-3 VIEW COMPARISON:  None. FINDINGS: Lung apices are unremarkable. Calcifications in the soft tissues of the right neck are consistent with carotid calcifications. The cervical spine is only well seen through the C4 level on the lateral view. C5 and C6 are partially visualized but obscured by overlapping soft tissues. No obvious fracture malalignment within visualize limits. IMPRESSION: Limited study below the C4 level. No fractures or malalignment identified. There are uncovertebral and facet degenerative changes. Electronically Signed   By: Dorise Bullion III M.D   On: 02/14/2018 14:19   Dg Thoracic Spine 2 View  Result Date: 02/14/2018 CLINICAL DATA:  Pain from neck down.  The pain is chronic. EXAM: THORACIC SPINE 2 VIEWS COMPARISON:  None. FINDINGS: Multilevel degenerative changes. No fractures or malalignment identified. IMPRESSION: Multilevel degenerative changes. Electronically Signed   By: Dorise Bullion III M.D   On: 02/14/2018 14:15   Dg Lumbar Spine 2-3 Views  Result Date: 02/14/2018 CLINICAL DATA:  Chronic back pain. EXAM: LUMBAR SPINE - 2-3 VIEW COMPARISON:  None. FINDINGS: SI joint degenerative changes inferiorly. Pedicle rods and screws are seen at L3, L4, L5, and S1. Disc spacer devices are seen at L3-4 and L4-5. Hardware is in good position. Grade 1 anterolisthesis of L4 versus L5. Vacuum disc phenomena at L2-3. Lower lumbar facet degenerative changes. No other abnormalities or changes. IMPRESSION: Degenerative changes. Surgical hardware, stable. No acute abnormalities. Electronically Signed   By: Dorise Bullion III M.D   On: 02/14/2018 14:17   Dg Chest Port 1 View  Result Date: 02/14/2018 CLINICAL DATA:  Shortness of breath today.  EXAM: PORTABLE CHEST 1 VIEW COMPARISON:  Chest x-rays dated 01/20/2017 and 12/11/2015. FINDINGS: Increased interstitial prominence bilaterally indicating interstitial edema. More focal dense opacity at the RIGHT lung base, atelectasis versus pneumonia. No pleural effusion or pneumothorax seen. Stable cardiomegaly. Aortic  atherosclerosis. No acute appearing osseous abnormality. IMPRESSION: 1. Cardiomegaly with bilateral interstitial prominence, presumably interstitial edema, most likely CHF. 2. Focal confluent opacity at the RIGHT lung base, atelectasis versus pneumonia. If afebrile, most likely atelectasis. Electronically Signed   By: Franki Cabot M.D.   On: 02/14/2018 07:52    Medications:   . acetaminophen  1,000 mg Oral Q6H  . apixaban  5 mg Oral BID  . brimonidine  1 drop Both Eyes BID   And  . timolol  1 drop Both Eyes BID  . Chlorhexidine Gluconate Cloth  6 each Topical Q0600  . cholecalciferol  1,000 Units Oral Once per day on Mon Tue Wed Fri Sat  . felodipine  10 mg Oral Daily  . ferrous sulfate  325 mg Oral Q breakfast  . FLUoxetine  20 mg Oral q morning - 10a  . furosemide  40 mg Oral Daily  . glipiZIDE  5 mg Oral QHS  . insulin aspart  0-15 Units Subcutaneous TID WC  . insulin glargine  10 Units Subcutaneous QHS  . levothyroxine  125 mcg Oral QAC breakfast  . losartan  25 mg Oral Daily  . memantine  10 mg Oral BID  . metoprolol tartrate  25 mg Oral BID  . mirabegron ER  50 mg Oral QPM  . mupirocin ointment  1 application Nasal BID  . potassium chloride  20 mEq Oral BID  . pravastatin  80 mg Oral Daily  . QUEtiapine  25 mg Oral QHS  . rivastigmine  4.6 mg Transdermal Daily  . silver sulfADIAZINE  1 application Topical Daily  . sodium chloride flush  3 mL Intravenous Q12H  . vitamin B-12  100 mcg Oral Daily  . vitamin C  1,000 mg Oral Once per day on Mon Wed Fri   Continuous Infusions: . sodium chloride       LOS: 1 day   Geradine Girt  [facility]   *Please refer to [email or web address], password [email or web address] to get updated schedule on who will round on this patient, as hospitalists switch teams weekly. If 7PM-7AM, please contact night-coverage at [email or web address], password [email or web address] for any overnight needs.  02/15/2018, 2:14 PM

## 2018-02-15 NOTE — Progress Notes (Signed)
Patient is active with [facility] for [facility] as prior to admission; Aneta Mins [phone]

## 2018-02-15 NOTE — Discharge Instructions (Signed)

## 2018-02-15 NOTE — Progress Notes (Signed)
Progress Note  Patient Name: [name] [name] Date of Encounter: 02/15/2018  Primary Cardiologist: No primary care provider on file.   Subjective   Feels much better, no further bleeding complaints.  Abdomen feels less bloated.  Good appetite. Pretty well oriented today, some hesitation when asked about the names of her previous care providers.  Inpatient Medications    Scheduled Meds: . acetaminophen  1,000 mg Oral Q6H  . apixaban  5 mg Oral BID  . aspirin EC  81 mg Oral Daily  . brimonidine  1 drop Both Eyes BID   And  . timolol  1 drop Both Eyes BID  . Chlorhexidine Gluconate Cloth  6 each Topical Q0600  . cholecalciferol  1,000 Units Oral Once per day on Mon Tue Wed Fri Sat  . felodipine  10 mg Oral Daily  . ferrous sulfate  325 mg Oral Q breakfast  . FLUoxetine  20 mg Oral q morning - 10a  . furosemide  40 mg Oral Daily  . glipiZIDE  5 mg Oral QHS  . insulin aspart  0-15 Units Subcutaneous TID WC  . insulin glargine  10 Units Subcutaneous QHS  . levothyroxine  125 mcg Oral QAC breakfast  . losartan  25 mg Oral Daily  . memantine  10 mg Oral BID  . metoprolol tartrate  25 mg Oral BID  . mirabegron ER  50 mg Oral QPM  . mupirocin ointment  1 application Nasal BID  . pioglitazone  30 mg Oral q morning - 10a  . potassium chloride  20 mEq Oral BID  . pravastatin  80 mg Oral Daily  . QUEtiapine  25 mg Oral QHS  . rivastigmine  4.6 mg Transdermal Daily  . silver sulfADIAZINE  1 application Topical Daily  . sodium chloride flush  3 mL Intravenous Q12H  . vitamin B-12  100 mcg Oral Daily  . vitamin C  1,000 mg Oral Once per day on Mon Wed Fri   Continuous Infusions: . sodium chloride     PRN Meds: sodium chloride, ondansetron (ZOFRAN) IV, sodium chloride flush, traMADol, traMADol   Vital Signs    Vitals:   02/14/18 2114 02/15/18 0151 02/15/18 0513 02/15/18 0922  BP: (!) 105/49 130/68 (!) 151/64 (!) 119/49  Pulse: 100 64 81 84  Resp: 20 18 18 18   Temp: 98.2 F  (36.8 C) 99.1 F (37.3 C) 98.3 F (36.8 C) 98.4 F (36.9 C)  TempSrc: Oral Oral Oral Oral  SpO2: 98% 96% 92% 95%  Weight:   178 lb 6.4 oz (80.9 kg)   Height:        Intake/Output Summary (Last 24 hours) at 02/15/2018 1031 Last data filed at 02/15/2018 1008 Gross per 24 hour  Intake 340 ml  Output 4050 ml  Net -3710 ml   Filed Weights   02/14/18 1705 02/15/18 0513  Weight: 181 lb 9.6 oz (82.4 kg) 178 lb 6.4 oz (80.9 kg)    Telemetry    Atrial fibrillation with controlled ventricular response, generally in the 70s-80s, rarely over 100 with activity- Personally Reviewed  ECG    6/2 ECG showed atrial fibrillation rapid ventricular response; today's ECG shows good ventricular rate control, nonspecific T wave changes.- Personally Reviewed  Physical Exam  Relaxed, comfortable GEN: No acute distress.   Neck: No JVD Cardiac:  Irregular, no murmurs, rubs, or gallops.  I do not appreciate a systolic murmur even with provocative maneuvers. Respiratory: Clear to auscultation bilaterally. GI: Soft, nontender,  non-distended  MS: No edema; No deformity. Neuro:  Nonfocal  Psych: Normal affect   Labs    Chemistry Recent Labs  Lab 02/14/18 0730 02/15/18 0427  NA 139 139  K 4.4 4.2  CL 106 105  CO2 25 30  GLUCOSE 173* 174*  BUN 20 22*  CREATININE 1.04* 1.20*  CALCIUM 9.1 8.6*  GFRNONAA 49* 41*  GFRAA 57* 48*  ANIONGAP 8 4*     Hematology Recent Labs  Lab 02/14/18 0730  WBC 9.7  RBC 2.87*  HGB 9.8*  HCT 32.6*  MCV 113.6*  MCH 34.1*  MCHC 30.1  RDW 15.6*  PLT 228    Cardiac Enzymes Recent Labs  Lab 02/14/18 1300 02/14/18 1929  TROPONINI 0.03* 0.04*    Recent Labs  Lab 02/14/18 0742  TROPIPOC 0.02     BNP Recent Labs  Lab 02/14/18 0730  BNP 591.4*     DDimer No results for input(s): DDIMER in the last 168 hours.   Radiology    Dg Cervical Spine 2 Or 3 Views  Result Date: 02/14/2018 CLINICAL DATA:  Chronic pain. EXAM: CERVICAL SPINE - 2-3 VIEW  COMPARISON:  None. FINDINGS: Lung apices are unremarkable. Calcifications in the soft tissues of the right neck are consistent with carotid calcifications. The cervical spine is only well seen through the C4 level on the lateral view. C5 and C6 are partially visualized but obscured by overlapping soft tissues. No obvious fracture malalignment within visualize limits. IMPRESSION: Limited study below the C4 level. No fractures or malalignment identified. There are uncovertebral and facet degenerative changes. Electronically Signed   By: Dorise Bullion III M.D   On: 02/14/2018 14:19   Dg Thoracic Spine 2 View  Result Date: 02/14/2018 CLINICAL DATA:  Pain from neck down.  The pain is chronic. EXAM: THORACIC SPINE 2 VIEWS COMPARISON:  None. FINDINGS: Multilevel degenerative changes. No fractures or malalignment identified. IMPRESSION: Multilevel degenerative changes. Electronically Signed   By: Dorise Bullion III M.D   On: 02/14/2018 14:15   Dg Lumbar Spine 2-3 Views  Result Date: 02/14/2018 CLINICAL DATA:  Chronic back pain. EXAM: LUMBAR SPINE - 2-3 VIEW COMPARISON:  None. FINDINGS: SI joint degenerative changes inferiorly. Pedicle rods and screws are seen at L3, L4, L5, and S1. Disc spacer devices are seen at L3-4 and L4-5. Hardware is in good position. Grade 1 anterolisthesis of L4 versus L5. Vacuum disc phenomena at L2-3. Lower lumbar facet degenerative changes. No other abnormalities or changes. IMPRESSION: Degenerative changes. Surgical hardware, stable. No acute abnormalities. Electronically Signed   By: Dorise Bullion III M.D   On: 02/14/2018 14:17   Dg Chest Port 1 View  Result Date: 02/14/2018 CLINICAL DATA:  Shortness of breath today. EXAM: PORTABLE CHEST 1 VIEW COMPARISON:  Chest x-rays dated 01/20/2017 and 12/11/2015. FINDINGS: Increased interstitial prominence bilaterally indicating interstitial edema. More focal dense opacity at the RIGHT lung base, atelectasis versus pneumonia. No pleural  effusion or pneumothorax seen. Stable cardiomegaly. Aortic atherosclerosis. No acute appearing osseous abnormality. IMPRESSION: 1. Cardiomegaly with bilateral interstitial prominence, presumably interstitial edema, most likely CHF. 2. Focal confluent opacity at the RIGHT lung base, atelectasis versus pneumonia. If afebrile, most likely atelectasis. Electronically Signed   By: Franki Cabot M.D.   On: 02/14/2018 07:52    Cardiac Studies    ECHO: 07/2014 - Left ventricle: The cavity size was normal. There was moderate concentric hypertrophy. Severe septal hypertrophy - suggestive of hypertrophic cardiomyopathy. No LVOT obstruction is noted - there  is a 15 mmHg rest gradient across the AOV. Systolic function was vigorous. The estimated ejection fraction was in the range of 65% to 70%. Wall motion was normal; there were no regional wall motion abnormalities. Doppler parameters are consistent with abnormal left ventricular relaxation (grade 1 diastolic dysfunction). The E/e&' ratio is between 8-15, suggesting indeterminate LV filling pressure. - Aortic valve: Trileaflet. Sclerosis without stenosis. There was no regurgitation. - Left atrium: The atrium was moderately dilated (40 ml/m2). - Right atrium: The atrium was mildly dilated. - Tricuspid valve: There was moderate regurgitation. - Pulmonary arteries: PA peak pressure: 42 mm Hg (S). Impressions: - LVEF 65-70%, moderate LVH, severe septal hypertrophy concerning for HCM, no significant LVOT gradient, aortic sclerosis with mean gradient of 15 mmHg across the valve. Moderate LAE. Moderate TR, RVSP 42 mmHg.    Patient Profile     84 y.o. female with acute on chronic diastolic heart failure, improved with diuretics, on a background of coronary artery disease, bilateral carotid artery stenosis, left SFA obstruction, hypertrophic cardiomyopathy likely due to hypertension, hyperlipidemia, mild pulmonary artery  hypertension  Assessment & Plan    1. CHF: Weight today is 178 pounds which is her reported weight at home when feeling well.  Irises 4.6 L per in/out.  We will switch to p.o. diuretics.  Anticipate discharge in 24 hours unless fluid re-accumulates.  I recommend discontinuing Actos as part of her treatment for diabetes, since this will promote recurrent heart failure exacerbation.  Ideally, would be treated with an SGLT2 inhibitor may actually reduce the likelihood of recurrent heart failure.  Reviewed the importance of sodium restriction and daily weight monitoring while she gets home.  Because of her dementia she will need assistance with this. 2. HCM: Suspect she has left ventricular hypertrophy due to hypertension, not true genetic hypertrophic obstructive cardiomyopathy. 3. AFib: Agree that this is likely a permanent arrhythmia and the risks of amiodarone are not justified.  Need to make sure that adequate rate control is maintained after discontinuation of this medication.  Can increase the metoprolol to 50 mg twice daily when needed.  Increased risk of bleeding with aspirin while on full anticoagulation with Eliquis.  I understand this may be indicated for PAD reasons, but unless she has had a recent intervention, would recommend discontinuation.  For questions or updates, please contact [place] Please consult [email or web address] for contact info under Cardiology/STEMI.      Signed, Sanda Klein, MD  02/15/2018, 10:31 AM

## 2018-02-16 ENCOUNTER — Inpatient Hospital Stay (HOSPITAL_COMMUNITY): Payer: Medicare Other

## 2018-02-16 ENCOUNTER — Ambulatory Visit: Payer: Medicare Other | Admitting: Family

## 2018-02-16 DIAGNOSIS — F0390 Unspecified dementia without behavioral disturbance: Secondary | ICD-10-CM

## 2018-02-16 DIAGNOSIS — Z515 Encounter for palliative care: Secondary | ICD-10-CM

## 2018-02-16 DIAGNOSIS — R0602 Shortness of breath: Secondary | ICD-10-CM | POA: Diagnosis not present

## 2018-02-16 DIAGNOSIS — I482 Chronic atrial fibrillation: Secondary | ICD-10-CM | POA: Diagnosis not present

## 2018-02-16 DIAGNOSIS — E1151 Type 2 diabetes mellitus with diabetic peripheral angiopathy without gangrene: Secondary | ICD-10-CM | POA: Diagnosis not present

## 2018-02-16 DIAGNOSIS — I110 Hypertensive heart disease with heart failure: Secondary | ICD-10-CM | POA: Diagnosis not present

## 2018-02-16 DIAGNOSIS — I2510 Atherosclerotic heart disease of native coronary artery without angina pectoris: Secondary | ICD-10-CM | POA: Diagnosis not present

## 2018-02-16 DIAGNOSIS — J9601 Acute respiratory failure with hypoxia: Secondary | ICD-10-CM | POA: Diagnosis not present

## 2018-02-16 DIAGNOSIS — I371 Nonrheumatic pulmonary valve insufficiency: Secondary | ICD-10-CM

## 2018-02-16 DIAGNOSIS — I340 Nonrheumatic mitral (valve) insufficiency: Secondary | ICD-10-CM | POA: Diagnosis not present

## 2018-02-16 DIAGNOSIS — I5031 Acute diastolic (congestive) heart failure: Secondary | ICD-10-CM | POA: Diagnosis not present

## 2018-02-16 DIAGNOSIS — I6523 Occlusion and stenosis of bilateral carotid arteries: Secondary | ICD-10-CM | POA: Diagnosis not present

## 2018-02-16 DIAGNOSIS — I361 Nonrheumatic tricuspid (valve) insufficiency: Secondary | ICD-10-CM | POA: Diagnosis not present

## 2018-02-16 LAB — CBC
HCT: 27.9 % — ABNORMAL LOW (ref 36.0–46.0)
Hemoglobin: 8.7 g/dL — ABNORMAL LOW (ref 12.0–15.0)
MCH: 34.5 pg — ABNORMAL HIGH (ref 26.0–34.0)
MCHC: 31.2 g/dL (ref 30.0–36.0)
MCV: 110.7 fL — ABNORMAL HIGH (ref 78.0–100.0)
PLATELETS: 190 10*3/uL (ref 150–400)
RBC: 2.52 MIL/uL — ABNORMAL LOW (ref 3.87–5.11)
RDW: 14.9 % (ref 11.5–15.5)
WBC: 7.3 10*3/uL (ref 4.0–10.5)

## 2018-02-16 LAB — BASIC METABOLIC PANEL
Anion gap: 9 (ref 5–15)
BUN: 25 mg/dL — AB (ref 6–20)
CO2: 29 mmol/L (ref 22–32)
CREATININE: 1.12 mg/dL — AB (ref 0.44–1.00)
Calcium: 8.6 mg/dL — ABNORMAL LOW (ref 8.9–10.3)
Chloride: 99 mmol/L — ABNORMAL LOW (ref 101–111)
GFR calc Af Amer: 52 mL/min — ABNORMAL LOW (ref >60)
GFR calc non Af Amer: 45 mL/min — ABNORMAL LOW (ref >60)
Glucose, Bld: 164 mg/dL — ABNORMAL HIGH (ref 65–99)
Potassium: 3.8 mmol/L (ref 3.5–5.1)
SODIUM: 137 mmol/L (ref 135–145)

## 2018-02-16 LAB — ECHOCARDIOGRAM COMPLETE
Height: 60 in
Weight: 2880 oz

## 2018-02-16 LAB — GLUCOSE, CAPILLARY
Glucose-Capillary: 120 mg/dL — ABNORMAL HIGH (ref 65–99)
Glucose-Capillary: 135 mg/dL — ABNORMAL HIGH (ref 65–99)
Glucose-Capillary: 113 mg/dL — ABNORMAL HIGH (ref 65–99)
Glucose-Capillary: 170 mg/dL — ABNORMAL HIGH (ref 65–99)

## 2018-02-16 LAB — VITAMIN B12: Vitamin B-12: 1304 pg/mL — ABNORMAL HIGH (ref 180–914)

## 2018-02-16 LAB — HEMOGLOBIN AND HEMATOCRIT, BLOOD
HEMATOCRIT: 28.9 % — AB (ref 36.0–46.0)
Hemoglobin: 8.9 g/dL — ABNORMAL LOW (ref 12.0–15.0)

## 2018-02-16 MED ORDER — PERFLUTREN LIPID MICROSPHERE
1.0000 mL | INTRAVENOUS | Status: AC | PRN
  Administered 2018-02-16: 2 mL via INTRAVENOUS
  Filled 2018-02-16: qty 10

## 2018-02-16 NOTE — Progress Notes (Signed)
Progress Note    [name] [name]  UMP:[id number] DOB: 1934-09-09  DOA: 02/14/2018 PCP: Lajean Manes, MD    Brief Narrative:    Medical records reviewed and are as summarized below:  [name] [name] is an 84 y.o. female with medical history significant of atrial fibrillation on anticoagulation, diabetes type 2 with severe peripheral artery disease status post interventions to the left popliteal artery in July 2018, Lewy body dementia followed by neurology, coronary artery disease status post coronary artery bypass graft in 2012, hypothyroidism, Neri retention, vitamin D deficiency, vitamin B12 deficiency and diabetic retinopathy who presents the emergency department complaining of shortness of breath.  She was initially called EMS due to back pain.  EMS noted that she was short of breath her sats were 83% she was put on 100% nonrebreather.  She does not have any oxygen at home.  She was so short of breath that she was unable to speak or give any history she does have a history of atrial fibrillation is on Eliquis.  Her chads 2 vascular score is 8.  She was started on BiPAP therapy and had significant improvement in addition to being giving 60 mg of IV Lasix.     Assessment/Plan:   Principal Problem:   Acute diastolic CHF (congestive heart failure), NYHA class 3 (HCC) Active Problems:   Hypertension   CAD (coronary artery disease)   Bilateral carotid artery stenosis   Rhegmatogenous retinal detachment of right eye   Obesity (BMI 30-39.9)   Hypothyroidism   DM (diabetes mellitus), type 2 with peripheral vascular complications (HCC)   Hyperlipemia   Lewy body dementia with behavioral disturbance   Chronic atrial fibrillation (HCC)   Hypertrophic cardiomyopathy (HCC)   Gastroparesis due to DM ([place])   Urinary retention   Vitamin D deficiency   Vitamin B 12 deficiency   Pulmonary hypertension (HCC)  Acute diastolic congestive heart failure New York Heart Association association class  III:  -not on any Lasix at home.   -echo pending -not sure how compliant she is with low salt diet -if I/O correct down >6L yesterday  Acute respiratory failure -home O2 study -not on O2 at rest  Chronic atrial fibrillation:  -rate controlled -Eliquis for anticoagulation at home.  Pulmonary hypertension: Related to diastolic congestive heart failure.  Continue diuresis patient will likely need Lasix at discharge -echo: pending  Coronary artery disease: Status post coronary artery bypass graft in 2012.  Patient had a negative stress test in late 2015.  Further work-up per cardiology.  Diabetes mellitus type 2 with peripheral vascular complications on chronic insulin therapy:  -SSI, glipizide -will need to STOP actos   Lewy body dementia with behavioral disturbance as noted by Dr. Brett Fairy and her notes in August 2018.  Currently patient is on Exelon patch as well as Namenda.  Has a caretaker 3 days a week from best I can gather and has some home health as well.  Has a daughter and granddaughter who provides care several days a week from 9 AM to 2 PM. -needs 24/7-- social work consult as well as palliative care--- long discussion with daughter  Hypertension: Continue home medications.   Bilateral carotid artery stenosis status post carotid endarterectomy in January 2017. -has appointment with Dr. Luther Parody office mid June  Retinal detachment of right eye: Related to her diabetes  -eye gtts  Anemia- macrocytic -B12 replaced -check stool studies -folate pending -add fe for AM Obesity with a BMI of 30-39 Body  mass index is 35.15 kg/m.  Hypothyroidism:  -TSH normal   Hyperlipidemia: Continue pravastatin.  Urinary retention: Patient is on Myrbetriq this is a medication that is known to cause shortness of breath.  It may not be the best medication for her urinary retention.   -could d/c the Myrbetriq if the patient remains short of breath once her heart failure is  treated and she is at her dry weight.  Vitamin D deficiency: Continue vitamin D therapy.  vitamin B12 deficiency: Continue vitamin B12 therapy orally.    Family Communication/Anticipated D/C date and plan/Code Status   DVT prophylaxis: eliquis Code Status: Full Code.  Family Communication: long discussion with daughter-- very stressed-- recent loss of father as well as personal health issues Disposition Plan: pending eval-- will need 24/7 care at home   Medical Consultants:    cards.    Subjective:   Said people were talking too loud outside of her room last PM  Objective:    Vitals:   02/16/18 0615 02/16/18 0859 02/16/18 0946 02/16/18 1238  BP:  (!) 140/57 (!) 130/57 (!) 119/51  Pulse:  77 73 64  Resp:   16 18  Temp:   98 F (36.7 C) 99.5 F (37.5 C)  TempSrc:   Oral Oral  SpO2:  97% 95% 99%  Weight: 81.6 kg (180 lb)     Height:        Intake/Output Summary (Last 24 hours) at 02/16/2018 1335 Last data filed at 02/16/2018 1224 Gross per 24 hour  Intake 243 ml  Output 2150 ml  Net -1907 ml   Filed Weights   02/14/18 1705 02/15/18 0513 02/16/18 0615  Weight: 82.4 kg (181 lb 9.6 oz) 80.9 kg (178 lb 6.4 oz) 81.6 kg (180 lb)    Exam: In bed, NAD No increased work of breathing, no wheezing Irregular but rate controlled Knows person but situationally consfused  Data Reviewed:   I have personally reviewed following labs and imaging studies:  Labs: Labs show the following:   Basic Metabolic Panel: Recent Labs  Lab 02/14/18 0730 02/14/18 0804 02/15/18 0427 02/16/18 0554  NA 139  --  139 137  K 4.4  --  4.2 3.8  CL 106  --  105 99*  CO2 25  --  30 29  GLUCOSE 173*  --  174* 164*  BUN 20  --  22* 25*  CREATININE 1.04*  --  1.20* 1.12*  CALCIUM 9.1  --  8.6* 8.6*  MG  --  1.8  --   --    GFR Estimated Creatinine Clearance: 37.3 mL/min (A) (by C-G formula based on SCr of 1.12 mg/dL (H)). Liver Function Tests: No results for input(s): AST, ALT,  ALKPHOS, BILITOT, PROT, ALBUMIN in the last 168 hours. No results for input(s): LIPASE, AMYLASE in the last 168 hours. No results for input(s): AMMONIA in the last 168 hours. Coagulation profile No results for input(s): INR, PROTIME in the last 168 hours.  CBC: Recent Labs  Lab 02/14/18 0730 02/16/18 0554  WBC 9.7 7.3  NEUTROABS 6.5  --   HGB 9.8* 8.7*  HCT 32.6* 27.9*  MCV 113.6* 110.7*  PLT 228 190   Cardiac Enzymes: Recent Labs  Lab 02/14/18 1300 02/14/18 1929  TROPONINI 0.03* 0.04*   BNP (last 3 results) No results for input(s): PROBNP in the last 8760 hours. CBG: Recent Labs  Lab 02/15/18 1128 02/15/18 1646 02/15/18 2043 02/16/18 0817 02/16/18 1215  GLUCAP 241* 123* 254* 120*  135*   D-Dimer: No results for input(s): DDIMER in the last 72 hours. Hgb A1c: Recent Labs    02/14/18 1300  HGBA1C 5.7*   Lipid Profile: No results for input(s): CHOL, HDL, LDLCALC, TRIG, CHOLHDL, LDLDIRECT in the last 72 hours. Thyroid function studies: Recent Labs    02/14/18 1300  TSH 1.289   Anemia work up: Recent Labs    02/16/18 1123  VITAMINB12 1,304*   Sepsis Labs: Recent Labs  Lab 02/14/18 0730 02/14/18 0745 02/16/18 0554  WBC 9.7  --  7.3  LATICACIDVEN  --  0.71  --     Microbiology Recent Results (from the past 240 hour(s))  Culture, blood (routine x 2)     Status: None (Preliminary result)   Collection Time: 02/14/18  8:38 AM  Result Value Ref Range Status   Specimen Description BLOOD RIGHT FOREARM  Final   Special Requests   Final    BOTTLES DRAWN AEROBIC AND ANAEROBIC Blood Culture adequate volume   Culture   Final    NO GROWTH 1 DAY Performed at [facility], [place] [address] [place], [place] [address]    Report Status PENDING  Incomplete  Urine culture     Status: None   Collection Time: 02/14/18  9:34 AM  Result Value Ref Range Status   Specimen Description URINE, CLEAN CATCH  Final   Special Requests NONE  Final   Culture   Final      NO GROWTH Performed at [facility], [address], [place], [place] [address]    Report Status 02/15/2018 FINAL  Final  Culture, blood (routine x 2)     Status: None (Preliminary result)   Collection Time: 02/14/18  7:29 PM  Result Value Ref Range Status   Specimen Description BLOOD RIGHT ANTECUBITAL  Final   Special Requests   Final    BOTTLES DRAWN AEROBIC ONLY Blood Culture adequate volume   Culture   Final    NO GROWTH < 24 HOURS Performed at [facility], [place] [address] [place], [place] [address]    Report Status PENDING  Incomplete  MRSA PCR Screening     Status: Abnormal   Collection Time: 02/14/18 10:01 PM  Result Value Ref Range Status   MRSA by PCR POSITIVE (A) NEGATIVE Final    Comment:        The GeneXpert MRSA Assay (FDA approved for NASAL specimens only), is one component of a comprehensive MRSA colonization surveillance program. It is not intended to diagnose MRSA infection nor to guide or monitor treatment for MRSA infections. RESULT CALLED TO, READ BACK BY AND VERIFIED WITH: RASUL,G RN 02/14/18 AT 2348 SKEEN,P     Procedures and diagnostic studies:  Dg Cervical Spine 2 Or 3 Views  Result Date: 02/14/2018 CLINICAL DATA:  Chronic pain. EXAM: CERVICAL SPINE - 2-3 VIEW COMPARISON:  None. FINDINGS: Lung apices are unremarkable. Calcifications in the soft tissues of the right neck are consistent with carotid calcifications. The cervical spine is only well seen through the C4 level on the lateral view. C5 and C6 are partially visualized but obscured by overlapping soft tissues. No obvious fracture malalignment within visualize limits. IMPRESSION: Limited study below the C4 level. No fractures or malalignment identified. There are uncovertebral and facet degenerative changes. Electronically Signed   By: Dorise Bullion III M.D   On: 02/14/2018 14:19   Dg Thoracic Spine 2 View  Result Date: 02/14/2018 CLINICAL DATA:  Pain from  neck down.  The pain is  chronic. EXAM: THORACIC SPINE 2 VIEWS COMPARISON:  None. FINDINGS: Multilevel degenerative changes. No fractures or malalignment identified. IMPRESSION: Multilevel degenerative changes. Electronically Signed   By: Dorise Bullion III M.D   On: 02/14/2018 14:15   Dg Lumbar Spine 2-3 Views  Result Date: 02/14/2018 CLINICAL DATA:  Chronic back pain. EXAM: LUMBAR SPINE - 2-3 VIEW COMPARISON:  None. FINDINGS: SI joint degenerative changes inferiorly. Pedicle rods and screws are seen at L3, L4, L5, and S1. Disc spacer devices are seen at L3-4 and L4-5. Hardware is in good position. Grade 1 anterolisthesis of L4 versus L5. Vacuum disc phenomena at L2-3. Lower lumbar facet degenerative changes. No other abnormalities or changes. IMPRESSION: Degenerative changes. Surgical hardware, stable. No acute abnormalities. Electronically Signed   By: Dorise Bullion III M.D   On: 02/14/2018 14:17    Medications:   . acetaminophen  1,000 mg Oral Q6H  . apixaban  5 mg Oral BID  . brimonidine  1 drop Both Eyes BID   And  . timolol  1 drop Both Eyes BID  . Chlorhexidine Gluconate Cloth  6 each Topical Q0600  . cholecalciferol  1,000 Units Oral Once per day on Mon Tue Wed Fri Sat  . felodipine  10 mg Oral Daily  . ferrous sulfate  325 mg Oral Q breakfast  . FLUoxetine  20 mg Oral q morning - 10a  . furosemide  40 mg Oral Daily  . glipiZIDE  5 mg Oral QHS  . insulin aspart  0-15 Units Subcutaneous TID WC  . insulin glargine  10 Units Subcutaneous QHS  . levothyroxine  125 mcg Oral QAC breakfast  . losartan  25 mg Oral Daily  . memantine  10 mg Oral BID  . metoprolol tartrate  25 mg Oral BID  . mirabegron ER  50 mg Oral QPM  . mupirocin ointment  1 application Nasal BID  . potassium chloride  20 mEq Oral BID  . pravastatin  80 mg Oral Daily  . QUEtiapine  25 mg Oral QHS  . rivastigmine  4.6 mg Transdermal Daily  . silver sulfADIAZINE  1 application Topical Daily  . sodium chloride flush  3 mL Intravenous Q12H   . vitamin B-12  100 mcg Oral Daily  . vitamin C  1,000 mg Oral Once per day on Mon Wed Fri   Continuous Infusions: . sodium chloride       LOS: 2 days   Geradine Girt  [facility]   *Please refer to [email or web address], password [email or web address] to get updated schedule on who will round on this patient, as hospitalists switch teams weekly. If 7PM-7AM, please contact night-coverage at [email or web address], password [email or web address] for any overnight needs.  02/16/2018, 1:35 PM

## 2018-02-16 NOTE — Progress Notes (Signed)
  Echocardiogram 2D Echocardiogram has been performed.  [name] [name] 02/16/2018, 4:35 PM

## 2018-02-16 NOTE — Progress Notes (Signed)
Progress Note  Patient Name: [name] [name] Date of Encounter: 02/16/2018  Primary Cardiologist: No primary care provider on file.   Subjective   Feeling well, no dyspnea. Net -800 mL last 24h. Weight essentially unchanged 180 lb. AFib, VR 60-70.  Inpatient Medications    Scheduled Meds: . acetaminophen  1,000 mg Oral Q6H  . apixaban  5 mg Oral BID  . brimonidine  1 drop Both Eyes BID   And  . timolol  1 drop Both Eyes BID  . Chlorhexidine Gluconate Cloth  6 each Topical Q0600  . cholecalciferol  1,000 Units Oral Once per day on Mon Tue Wed Fri Sat  . felodipine  10 mg Oral Daily  . ferrous sulfate  325 mg Oral Q breakfast  . FLUoxetine  20 mg Oral q morning - 10a  . furosemide  40 mg Oral Daily  . glipiZIDE  5 mg Oral QHS  . insulin aspart  0-15 Units Subcutaneous TID WC  . insulin glargine  10 Units Subcutaneous QHS  . levothyroxine  125 mcg Oral QAC breakfast  . losartan  25 mg Oral Daily  . memantine  10 mg Oral BID  . metoprolol tartrate  25 mg Oral BID  . mirabegron ER  50 mg Oral QPM  . mupirocin ointment  1 application Nasal BID  . potassium chloride  20 mEq Oral BID  . pravastatin  80 mg Oral Daily  . QUEtiapine  25 mg Oral QHS  . rivastigmine  4.6 mg Transdermal Daily  . silver sulfADIAZINE  1 application Topical Daily  . sodium chloride flush  3 mL Intravenous Q12H  . vitamin B-12  100 mcg Oral Daily  . vitamin C  1,000 mg Oral Once per day on Mon Wed Fri   Continuous Infusions: . sodium chloride     PRN Meds: sodium chloride, ondansetron (ZOFRAN) IV, sodium chloride flush, traMADol, traMADol   Vital Signs    Vitals:   02/16/18 0408 02/16/18 0615 02/16/18 0859 02/16/18 0946  BP: (!) 147/67  (!) 140/57 (!) 130/57  Pulse: 66  77 73  Resp: 12   16  Temp: 98.4 F (36.9 C)   98 F (36.7 C)  TempSrc: Oral   Oral  SpO2: 94%  97% 95%  Weight:  180 lb (81.6 kg)    Height:        Intake/Output Summary (Last 24 hours) at 02/16/2018 0959 Last data  filed at 02/16/2018 0910 Gross per 24 hour  Intake 363 ml  Output 1200 ml  Net -837 ml   Filed Weights   02/14/18 1705 02/15/18 0513 02/16/18 0615  Weight: 181 lb 9.6 oz (82.4 kg) 178 lb 6.4 oz (80.9 kg) 180 lb (81.6 kg)    Telemetry    atrial fibrillation, well rate controlled - Personally Reviewed  ECG    No new tracing - Personally Reviewed  Physical Exam  Comfortable GEN: No acute distress.   Neck: No JVD Cardiac: irregular, no murmurs, rubs, or gallops.  Respiratory: Clear to auscultation bilaterally. GI: Soft, nontender, non-distended  MS: No edema; No deformity. Neuro:  Nonfocal  Psych: Normal affect   Labs    Chemistry Recent Labs  Lab 02/14/18 0730 02/15/18 0427 02/16/18 0554  NA 139 139 137  K 4.4 4.2 3.8  CL 106 105 99*  CO2 25 30 29   GLUCOSE 173* 174* 164*  BUN 20 22* 25*  CREATININE 1.04* 1.20* 1.12*  CALCIUM 9.1 8.6* 8.6*  GFRNONAA  49* 41* 45*  GFRAA 57* 48* 52*  ANIONGAP 8 4* 9     Hematology Recent Labs  Lab 02/14/18 0730 02/16/18 0554  WBC 9.7 7.3  RBC 2.87* 2.52*  HGB 9.8* 8.7*  HCT 32.6* 27.9*  MCV 113.6* 110.7*  MCH 34.1* 34.5*  MCHC 30.1 31.2  RDW 15.6* 14.9  PLT 228 190    Cardiac Enzymes Recent Labs  Lab 02/14/18 1300 02/14/18 1929  TROPONINI 0.03* 0.04*    Recent Labs  Lab 02/14/18 0742  TROPIPOC 0.02     BNP Recent Labs  Lab 02/14/18 0730  BNP 591.4*     DDimer No results for input(s): DDIMER in the last 168 hours.   Radiology    Dg Cervical Spine 2 Or 3 Views  Result Date: 02/14/2018 CLINICAL DATA:  Chronic pain. EXAM: CERVICAL SPINE - 2-3 VIEW COMPARISON:  None. FINDINGS: Lung apices are unremarkable. Calcifications in the soft tissues of the right neck are consistent with carotid calcifications. The cervical spine is only well seen through the C4 level on the lateral view. C5 and C6 are partially visualized but obscured by overlapping soft tissues. No obvious fracture malalignment within visualize  limits. IMPRESSION: Limited study below the C4 level. No fractures or malalignment identified. There are uncovertebral and facet degenerative changes. Electronically Signed   By: Dorise Bullion III M.D   On: 02/14/2018 14:19   Dg Thoracic Spine 2 View  Result Date: 02/14/2018 CLINICAL DATA:  Pain from neck down.  The pain is chronic. EXAM: THORACIC SPINE 2 VIEWS COMPARISON:  None. FINDINGS: Multilevel degenerative changes. No fractures or malalignment identified. IMPRESSION: Multilevel degenerative changes. Electronically Signed   By: Dorise Bullion III M.D   On: 02/14/2018 14:15   Dg Lumbar Spine 2-3 Views  Result Date: 02/14/2018 CLINICAL DATA:  Chronic back pain. EXAM: LUMBAR SPINE - 2-3 VIEW COMPARISON:  None. FINDINGS: SI joint degenerative changes inferiorly. Pedicle rods and screws are seen at L3, L4, L5, and S1. Disc spacer devices are seen at L3-4 and L4-5. Hardware is in good position. Grade 1 anterolisthesis of L4 versus L5. Vacuum disc phenomena at L2-3. Lower lumbar facet degenerative changes. No other abnormalities or changes. IMPRESSION: Degenerative changes. Surgical hardware, stable. No acute abnormalities. Electronically Signed   By: Dorise Bullion III M.D   On: 02/14/2018 14:17    Cardiac Studies   ECHO: 07/2014 - Left ventricle: The cavity size was normal. There was moderate concentric hypertrophy. Severe septal hypertrophy - suggestive of hypertrophic cardiomyopathy. No LVOT obstruction is noted - there is a 15 mmHg rest gradient across the AOV. Systolic function was vigorous. The estimated ejection fraction was in the range of 65% to 70%. Wall motion was normal; there were no regional wall motion abnormalities. Doppler parameters are consistent with abnormal left ventricular relaxation (grade 1 diastolic dysfunction). The E/e&' ratio is between 8-15, suggesting indeterminate LV filling pressure. - Aortic valve: Trileaflet. Sclerosis without stenosis.  There was no regurgitation. - Left atrium: The atrium was moderately dilated (40 ml/m2). - Right atrium: The atrium was mildly dilated. - Tricuspid valve: There was moderate regurgitation. - Pulmonary arteries: PA peak pressure: 42 mm Hg (S). Impressions: - LVEF 65-70%, moderate LVH, severe septal hypertrophy concerning for HCM, no significant LVOT gradient, aortic sclerosis with mean gradient of 15 mmHg across the valve. Moderate LAE. Moderate TR, RVSP 42 mmHg.    Patient Profile     84 y.o. female  with acute on chronic diastolic heart failure,  improved with diuretics, on a background of coronary artery disease, bilateral carotid artery stenosis, left SFA obstruction, hypertrophic cardiomyopathy likely due to hypertension, hyperlipidemia, mild pulmonary artery hypertension  Assessment & Plan    1. CHF:  Appears to be well compensated on current dose of p.o. diuretics.  Reviewed the importance of sodium restriction and daily weight monitoring while she gets home.  Because of her dementia she will need assistance with this. 2. HCM: Suspect she has left ventricular hypertrophy due to hypertension, not true genetic hypertrophic obstructive cardiomyopathy. 3. AFib:  Rate controlled.  Now a permanent arrhythmia.  Tolerating anticoagulation without overt bleeding problems, but note recent decline in hemoglobin.  Temporary interruption in anticoagulation is reasonably safe, but in the long run markedly increased embolic risk justifies use of anticoagulants. CHADSVasc 5 (age 55, gender, HTN, HF, DM, PAD). 4. Anemia: Macrocytic indices - work-up for vitamin Y11 or folic acid deficiency versus myelodysplasia (normal B12 level in 2011).   4. DM: Actos stopped due to congestive heart failure.  If additional oral antidiabetic agents are needed, prefer use of SGLT2 inhibitor such as Jardiance.   For questions or updates, please contact [place] Please consult [email or web address] for contact  info under Cardiology/STEMI.      Signed, Sanda Klein, MD  02/16/2018, 9:59 AM

## 2018-02-16 NOTE — Progress Notes (Signed)
Order placed for Do Not Resuscitate.  DNR arm band placed on patient

## 2018-02-16 NOTE — Consult Note (Signed)
Consultation Note Date: 02/16/2018   Patient Name: [name] [name]  DOB: Jul 05, 1934  MRN: [id number]  Age / Sex: 84 y.o., female  PCP: Lajean Manes, MD Referring Physician: Geradine Girt, DO  Reason for Consultation: Establishing goals of care and Psychosocial/spiritual support  HPI/Patient Profile: 84 y.o. female  with past medical history of CHF, pulmonary HTN, atrial fibrillation, dementia, bilateral carotid stenosis, and diabetes who was admitted on 02/14/2018 with shortness of breath.  She was found to have a heart failure exacerbation and required bipap.   Clinical Assessment and Goals of Care:  I have reviewed medical records including EPIC notes, labs and imaging, received report from Dr. Eliseo Squires, assessed the patient and then talked on the phone with her daughter [name] to discuss diagnosis prognosis, GOC, EOL wishes, disposition and options.  I introduced Palliative Medicine as specialized medical care for people living with serious illness. It focuses on providing relief from the symptoms and stress of a serious illness. The goal is to improve quality of life for both the patient and the family.  We discussed a brief life review of the patient. 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  She is cared for by her two daughters.  [name] lives locally and is the primary care taker.  [name] lives in [place] and is a part of all significant decision making.  [name] has been living at home with assistance, but the thought is that she will now need 24 hour care.  She was raised in the [facility] but is now part of the [employer] here in [place].  [name] described watching her mother decline significantly in the last couple of years.  Between back problems and a hip fracture her gait is  difficult.  [name] has had to take over the check book, and driving.  As far as functional and nutritional status she is eating fairly well and is able to walk short distances on a rolling walker.  Her will to live is strong.  We discussed her current illness and what it means in the larger context of her on-going co-morbidities.  Specifically we discussed dementia and heart failure.  Both are progressive and irreversible.    Daughter mentioned patient's significant anemia and that she is scheduled for a GI work up with [facility] in June.  We discussed code status.  The patient herself stated that if she suffered cardiac arrest and respiratory arrest she would not want resuscitation.  In talking with [name] on the phone - she would not want her mother to suffer thru a resuscitation attempt if she arrested.  They agreed to DNR code status.  However if [name] has a pulse or is breathing the family would want her to receive the benefit of hospitalization and treatment.  Hospice and Palliative Care services outpatient were explained and offered.  [name] would like for her mother to  be followed by Palliative Care outpatient at [facility].  Questions and concerns were addressed.  The family was encouraged to call with questions or concerns.     Primary Decision Maker:  NEXT OF KIN, [name]    SUMMARY OF RECOMMENDATIONS    Change code status to DNR, with full scope treatment.  If the patient arrests do not attempt resuscitation, if she has a pulse or is breathing she would want hospitalization and treatment.  Palliative Care to follow outpatient at SNF.    Please write in discharge summary "Palliative Care to follow at SNF".  Code Status/Advance Care Planning:  DNR  Psycho-social/Spiritual:   Desire for further Chaplaincy support: LDS  Prognosis:  Unable to determine.   Discharge Planning: [facility] for rehab with Palliative care service follow-up      Primary  Diagnoses: Present on Admission: . Acute diastolic CHF (congestive heart failure), NYHA class 3 ([place]) . Hypertension . DM (diabetes mellitus), type 2 with peripheral vascular complications ([employer]) . CAD (coronary artery disease) . Bilateral carotid artery stenosis . Rhegmatogenous retinal detachment of right eye . Obesity (BMI 30-39.9) . Lewy body dementia with behavioral disturbance . Hyperlipemia . Chronic atrial fibrillation ([place]) . Gastroparesis due to DM ([place]) . Hypothyroidism . Urinary retention . Vitamin D deficiency . Vitamin B 12 deficiency . Pulmonary hypertension ([place])   I have reviewed the medical record, interviewed the patient and family, and examined the patient. The following aspects are pertinent.  Past Medical History:  Diagnosis Date  . Anemia   . Anxiety   . Arthritis    "right wrist, lower back, maybe my right shoulder" (04/08/2017)  . Atrial fibrillation ([place])   . Bilateral carotid artery stenosis   . Bowel incontinence   . CAD (coronary artery disease)    a. s/p CABG 2012.  [name] Chronic lower back pain    "back is very stiff because of fusion; can't stand up straight more than few moments"  . Closed fracture of unspecified part of neck of femur   . Complication of anesthesia    anesth. meds caused hallucinations   . Cough    no fever, chronic  . Degenerative joint disease   . Dementia   . Depression   . Diverticulitis   . Endometriosis   . Fall at home 05/15/2013  . GERD (gastroesophageal reflux disease)   . Headache   . Hiatal hernia   . History of blood transfusion   . History of wheezing   . HLD (hyperlipidemia)   . Hx of cardiovascular stress test    Lexiscan Myoview (11/15):  Low risk stress nuclear study with a small, mild fixed apical septal perfusion defect. EF is 52% with mild septal hypokinesis. No ischemia.  [name] Hx of echocardiogram    Echo (11/15):  Mod LVH, severe septal hypertrophy (suggestive HCM), no LVOT obstruction, rest  gradient 15 mmHg across AV, vigorous LVF, EF 65-70%, no RWMA, Gr 1 DD, Ao sclerosis (no stenosis), mod LAE, mild RAE, mod TR, PASP 42 mmHg  . Hypertension   . Hypertonicity of bladder   . Hypertrophic cardiomyopathy ([place])   . Hypothyroidism   . Lumbago   . Muscle weakness (generalized)   . Obesity   . Osteoarthrosis, unspecified whether generalized or localized, unspecified site   . Progressive dementia with uncertain etiology 1/61/0960   Suspect Lewy body .  [name] Reflux esophagitis   . Stroke [facility]) 2011   "old very small TIA/neurologist"  . Type  II diabetes mellitus ([place])   . Ulcer    left foot x 2 years sees dr Little Ishikawa podiastrist for wound staying same tiny wart appearing areas no drainage changes dressing every 3 days  . Unspecified glaucoma(365.9)   . Urinary incontinence    Social History   Socioeconomic History  . Marital status: Divorced    Spouse name: Not on file  . Number of children: 2  . Years of education: COLLEGE  . Highest education level: Not on file  Occupational History    Comment: retired Pharmacist, hospital  Social Needs  . Financial resource strain: Not on file  . Food insecurity:    Worry: Not on file    Inability: Not on file  . Transportation needs:    Medical: Not on file    Non-medical: Not on file  Tobacco Use  . Smoking status: Former Smoker    Packs/day: 1.50    Years: 38.00    Pack years: 57.00    Types: Cigarettes    Last attempt to quit: 09/16/1987    Years since quitting: 30.4  . Smokeless tobacco: Never Used  Substance and Sexual Activity  . Alcohol use: No    Alcohol/week: 0.0 oz  . Drug use: No  . Sexual activity: Never  Lifestyle  . Physical activity:    Days per week: Not on file    Minutes per session: Not on file  . Stress: Not on file  Relationships  . Social connections:    Talks on phone: Not on file    Gets together: Not on file    Attends religious service: Not on file    Active member of club or organization: Not on file     Attends meetings of clubs or organizations: Not on file    Relationship status: Not on file  Other Topics Concern  . Not on file  Social History Narrative   Patient is divorced and lives alone.   Patient has two adult children.   Patient is a retired Pharmacist, hospital.   Patient has a college education.   Patient is left-handed.   Patient does not drink any caffeine.   Family History  Problem Relation Age of Onset  . Cancer Cousin   . Heart attack Mother   . Hypertension Mother   . Heart attack Father   . Heart disease Father        Before age 33  . Hyperlipidemia Father   . Hypertension Father   . Cancer Paternal Aunt    Scheduled Meds: . acetaminophen  1,000 mg Oral Q6H  . apixaban  5 mg Oral BID  . brimonidine  1 drop Both Eyes BID   And  . timolol  1 drop Both Eyes BID  . Chlorhexidine Gluconate Cloth  6 each Topical Q0600  . cholecalciferol  1,000 Units Oral Once per day on Mon Tue Wed Fri Sat  . felodipine  10 mg Oral Daily  . ferrous sulfate  325 mg Oral Q breakfast  . FLUoxetine  20 mg Oral q morning - 10a  . furosemide  40 mg Oral Daily  . glipiZIDE  5 mg Oral QHS  . insulin aspart  0-15 Units Subcutaneous TID WC  . insulin glargine  10 Units Subcutaneous QHS  . levothyroxine  125 mcg Oral QAC breakfast  . losartan  25 mg Oral Daily  . memantine  10 mg Oral BID  . metoprolol tartrate  25 mg Oral BID  . mirabegron ER  50 mg Oral QPM  . mupirocin ointment  1 application Nasal BID  . potassium chloride  20 mEq Oral BID  . pravastatin  80 mg Oral Daily  . QUEtiapine  25 mg Oral QHS  . rivastigmine  4.6 mg Transdermal Daily  . silver sulfADIAZINE  1 application Topical Daily  . sodium chloride flush  3 mL Intravenous Q12H  . vitamin B-12  100 mcg Oral Daily  . vitamin C  1,000 mg Oral Once per day on Mon Wed Fri   Continuous Infusions: . sodium chloride     PRN Meds:.sodium chloride, ondansetron (ZOFRAN) IV, perflutren lipid microspheres (DEFINITY) IV suspension,  sodium chloride flush, traMADol, traMADol Allergies  Allergen Reactions  . Amitriptyline Other (See Comments)    hallucinations  . Vesicare [Solifenacin] Other (See Comments)    hallucinations  . Vicodin [Hydrocodone-Acetaminophen] Other (See Comments)    Hallucinations - do NOT verify Vicodin orders  . Polysaccharide Iron Complex Hives, Itching and Rash    Patient can tolerate Ferrous Sulfate   Review of Systems No complaints.  Physical Exam  Well developed elderly female, Awake, alert orientated x 3 CV irreg rhythm, reg rate Resp no distress  Abdomen soft, nd, nt L Ext 2+ edema.  Vital Signs: BP (!) 123/49 (BP Location: Right Arm)   Pulse 61   Temp 98.1 F (36.7 C) (Oral)   Resp 18   Ht 5' (1.524 m)   Wt 81.6 kg (180 lb)   SpO2 100%   BMI 35.15 kg/m  Pain Scale: 0-10   Pain Score: 0-No pain   SpO2: SpO2: 100 % O2 Device:SpO2: 100 % O2 Flow Rate: .O2 Flow Rate (L/min): 3 L/min  IO: Intake/output summary:   Intake/Output Summary (Last 24 hours) at 02/16/2018 1627 Last data filed at 02/16/2018 1224 Gross per 24 hour  Intake 243 ml  Output 1550 ml  Net -1307 ml    LBM: Last BM Date: (Stated she had a small BM last night before she went to bed) Baseline Weight: Weight: 82.4 kg (181 lb 9.6 oz) Most recent weight: Weight: 81.6 kg (180 lb)     Palliative Assessment/Data: 50%     Time In: 11:00 Time Out: 12:10 Time Total: 70 min. Greater than 50%  of this time was spent counseling and coordinating care related to the above assessment and plan.  Signed by: Florentina Jenny, PA-C Palliative Medicine Pager: [phone]  Please contact Palliative Medicine Team phone at [phone] for questions and concerns.  For individual provider: See Shea Evans

## 2018-02-17 DIAGNOSIS — G3183 Dementia with Lewy bodies: Secondary | ICD-10-CM

## 2018-02-17 DIAGNOSIS — E1151 Type 2 diabetes mellitus with diabetic peripheral angiopathy without gangrene: Secondary | ICD-10-CM | POA: Diagnosis not present

## 2018-02-17 DIAGNOSIS — I10 Essential (primary) hypertension: Secondary | ICD-10-CM | POA: Diagnosis not present

## 2018-02-17 DIAGNOSIS — F0281 Dementia in other diseases classified elsewhere with behavioral disturbance: Secondary | ICD-10-CM

## 2018-02-17 DIAGNOSIS — E538 Deficiency of other specified B group vitamins: Secondary | ICD-10-CM

## 2018-02-17 DIAGNOSIS — J9601 Acute respiratory failure with hypoxia: Secondary | ICD-10-CM | POA: Diagnosis not present

## 2018-02-17 DIAGNOSIS — I5031 Acute diastolic (congestive) heart failure: Secondary | ICD-10-CM | POA: Diagnosis not present

## 2018-02-17 DIAGNOSIS — I110 Hypertensive heart disease with heart failure: Secondary | ICD-10-CM | POA: Diagnosis not present

## 2018-02-17 DIAGNOSIS — R0602 Shortness of breath: Secondary | ICD-10-CM | POA: Diagnosis not present

## 2018-02-17 LAB — FOLATE RBC
Folate, RBC: >2313 ng/mL (ref >498)
Hematocrit: 26.8 % — ABNORMAL LOW (ref 34.0–46.6)

## 2018-02-17 LAB — COMPREHENSIVE METABOLIC PANEL
ALBUMIN: 2.8 g/dL — AB (ref 3.5–5.0)
ALT: 15 U/L (ref 14–54)
ANION GAP: 5 (ref 5–15)
AST: 22 U/L (ref 15–41)
Alkaline Phosphatase: 80 U/L (ref 38–126)
BUN: 31 mg/dL — ABNORMAL HIGH (ref 6–20)
CO2: 27 mmol/L (ref 22–32)
Calcium: 8.4 mg/dL — ABNORMAL LOW (ref 8.9–10.3)
Chloride: 107 mmol/L (ref 101–111)
Creatinine, Ser: 1.23 mg/dL — ABNORMAL HIGH (ref 0.44–1.00)
GFR calc Af Amer: 46 mL/min — ABNORMAL LOW (ref >60)
GFR calc non Af Amer: 40 mL/min — ABNORMAL LOW (ref >60)
Glucose, Bld: 125 mg/dL — ABNORMAL HIGH (ref 65–99)
POTASSIUM: 4.3 mmol/L (ref 3.5–5.1)
SODIUM: 139 mmol/L (ref 135–145)
Total Bilirubin: 0.5 mg/dL (ref 0.3–1.2)
Total Protein: 6 g/dL — ABNORMAL LOW (ref 6.5–8.1)

## 2018-02-17 LAB — GLUCOSE, CAPILLARY
GLUCOSE-CAPILLARY: 119 mg/dL — AB (ref 65–99)
GLUCOSE-CAPILLARY: 173 mg/dL — AB (ref 65–99)
Glucose-Capillary: 145 mg/dL — ABNORMAL HIGH (ref 65–99)
Glucose-Capillary: 146 mg/dL — ABNORMAL HIGH (ref 65–99)

## 2018-02-17 LAB — IRON AND TIBC
Iron: 35 ug/dL (ref 28–170)
Saturation Ratios: 12 % (ref 10.4–31.8)
TIBC: 283 ug/dL (ref 250–450)
UIBC: 248 ug/dL

## 2018-02-17 LAB — CBC
HCT: 29.6 % — ABNORMAL LOW (ref 36.0–46.0)
Hemoglobin: 9 g/dL — ABNORMAL LOW (ref 12.0–15.0)
MCH: 34.6 pg — AB (ref 26.0–34.0)
MCHC: 30.4 g/dL (ref 30.0–36.0)
MCV: 113.8 fL — AB (ref 78.0–100.0)
PLATELETS: 202 10*3/uL (ref 150–400)
RBC: 2.6 MIL/uL — ABNORMAL LOW (ref 3.87–5.11)
RDW: 15.1 % (ref 11.5–15.5)
WBC: 6.3 10*3/uL (ref 4.0–10.5)

## 2018-02-17 LAB — LACTATE DEHYDROGENASE: LDH: 226 U/L — ABNORMAL HIGH (ref 98–192)

## 2018-02-17 LAB — FERRITIN: FERRITIN: 61 ng/mL (ref 11–307)

## 2018-02-17 NOTE — [place] FL2 (Signed)
[place] MEDICAID FL2 LEVEL OF CARE SCREENING TOOL     IDENTIFICATION  Patient Name: [name] [name] Birthdate: [birth year] Sex: female Admission Date (Current Location): 02/14/2018  [facility] and [place] Number:  Herbalist and Address:  [facility], [place] [address], [place], [place] [address]      Provider Number: [id number]  Attending Physician Name and Address:  Geradine Girt, DO  Relative Name and Phone Number:       Current Level of Care: Hospital Recommended Level of Care: [place] Prior Approval Number:    Date Approved/Denied:   PASRR Number: [id number]  Discharge Plan: SNF    Current Diagnoses: Patient Active Problem List   Diagnosis Date Noted  . Acute respiratory failure with hypoxia ([place])   . Palliative care encounter   . Acute diastolic CHF (congestive heart failure), NYHA class 3 ([place]) 02/14/2018  . Urinary retention 02/14/2018  . Vitamin D deficiency 02/14/2018  . Vitamin B 12 deficiency 02/14/2018  . Pulmonary hypertension ([place]) 02/14/2018  . PAD (peripheral artery disease) ([place]) 04/08/2017  . Late onset Alzheimer's disease without behavioral disturbance 02/12/2016  . Gastroparesis due to DM ([place]) 02/12/2016  . Diabetes mellitus due to underlying condition with diabetic retinopathy 02/12/2016  . Carotid stenosis 10/01/2015  . Abnormality of gait 08/13/2015  . Hypertrophic cardiomyopathy ([place]) 08/30/2014  . Other specified transient cerebral ischemias 08/02/2014  . Chronic atrial fibrillation ([place]) 08/02/2014  . Lewy body dementia with behavioral disturbance 02/21/2014  . Peripheral vascular disease ([place]) 01/18/2014  . [place] post total shoulder arthroplasty 12/15/2013  . DM (diabetes mellitus), type 2 with peripheral vascular complications ([place]) 25/01/3975  . Constipation 12/08/2013  . Hyperlipemia 12/08/2013  . Dementia 12/08/2013  . Hypothyroidism 10/30/2013  . Osteoarthritis of right shoulder  10/07/2013  . Fall at home 05/15/2013  . Moderate vision impairment-both eyes 05/15/2013  . Obesity (BMI 30-39.9) 05/15/2013  . Rhegmatogenous retinal detachment of right eye 01/10/2013  . Hypertension   . Thyroid disease   . Depression   . IDDM (insulin dependent diabetes mellitus) ([place])   . GERD (gastroesophageal reflux disease)   . CAD (coronary artery disease)   . Bilateral carotid artery stenosis   . Obesity   . Degenerative joint disease   . Hiatal hernia     Orientation RESPIRATION BLADDER Height & Weight     Self, Time, Situation, Place  Normal Incontinent, External catheter Weight: 173 lb 9.6 oz (78.7 kg) Height:  5' (152.4 cm)  BEHAVIORAL SYMPTOMS/MOOD NEUROLOGICAL BOWEL NUTRITION STATUS  (None) (Lewy Body Dementia) Continent Diet(Heart healthy/carb modified)  AMBULATORY STATUS COMMUNICATION OF NEEDS Skin   Limited Assist Verbally Skin abrasions(Venous stasis ulcer on right leg on left side of shin: Foam daily.)                       Personal Care Assistance Level of Assistance              Functional Limitations Info  Sight, Hearing, Speech Sight Info: Adequate Hearing Info: Adequate Speech Info: Adequate    SPECIAL CARE FACTORS FREQUENCY  PT (By licensed PT), Blood pressure     PT Frequency: 5 x week              Contractures Contractures Info: Not present    Additional Factors Info  Code Status, Allergies, Isolation Precautions Code Status Info: DNR Allergies Info: Amitriptyline, Vesicare (Solifenacin), VIcodin (Hydrocodone-acetaminophen), Polysaccharide Iron Complex  Isolation Precautions Info: Contact: MRSA     Current Medications (02/17/2018):  This is the current hospital active medication list Current Facility-Administered Medications  Medication Dose Route Frequency Provider Last Rate Last Dose  . 0.9 %  sodium chloride infusion  250 mL Intravenous PRN Lady Deutscher, MD      . acetaminophen (TYLENOL) tablet 1,000 mg   1,000 mg Oral Q6H Lady Deutscher, MD   1,000 mg at 02/17/18 0920  . apixaban (ELIQUIS) tablet 5 mg  5 mg Oral BID Lady Deutscher, MD   5 mg at 02/17/18 0920  . brimonidine (ALPHAGAN) 0.2 % ophthalmic solution 1 drop  1 drop Both Eyes BID Lady Deutscher, MD   1 drop at 02/17/18 1131   And  . timolol (TIMOPTIC) 0.5 % ophthalmic solution 1 drop  1 drop Both Eyes BID Lady Deutscher, MD   1 drop at 02/17/18 1131  . Chlorhexidine Gluconate Cloth 2 % PADS 6 each  6 each Topical Q0600 Lady Deutscher, MD   6 each at 02/17/18 0555  . cholecalciferol (VITAMIN D) tablet 1,000 Units  1,000 Units Oral Once per day on Mon Tue Wed Fri Sat Lady Deutscher, MD   1,000 Units at 02/17/18 0920  . felodipine (PLENDIL) 24 hr tablet 10 mg  10 mg Oral Daily Lady Deutscher, MD   10 mg at 02/17/18 1157  . ferrous sulfate tablet 325 mg  325 mg Oral Q breakfast Lady Deutscher, MD   325 mg at 02/17/18 0919  . FLUoxetine (PROZAC) capsule 20 mg  20 mg Oral q morning - 10a Lady Deutscher, MD   20 mg at 02/17/18 1127  . furosemide (LASIX) tablet 40 mg  40 mg Oral Daily Croitoru, Mihai, MD   40 mg at 02/17/18 1128  . glipiZIDE (GLUCOTROL XL) 24 hr tablet 5 mg  5 mg Oral QHS Lady Deutscher, MD   5 mg at 02/16/18 2218  . insulin aspart (novoLOG) injection 0-15 Units  0-15 Units Subcutaneous TID WC Lady Deutscher, MD   2 Units at 02/17/18 [phone]  . insulin glargine (LANTUS) injection 10 Units  10 Units Subcutaneous QHS Lady Deutscher, MD   10 Units at 02/16/18 2157  . levothyroxine (SYNTHROID, LEVOTHROID) tablet 125 mcg  125 mcg Oral QAC breakfast Lady Deutscher, MD   125 mcg at 02/17/18 0551  . losartan (COZAAR) tablet 25 mg  25 mg Oral Daily Lady Deutscher, MD   25 mg at 02/17/18 1127  . memantine (NAMENDA) tablet 10 mg  10 mg Oral BID Lady Deutscher, MD   10 mg at 02/17/18 1127  . metoprolol tartrate (LOPRESSOR) tablet 25 mg  25 mg Oral BID Lady Deutscher, MD   25 mg at  02/17/18 1128  . mirabegron ER (MYRBETRIQ) tablet 50 mg  50 mg Oral QPM Lady Deutscher, MD   50 mg at 02/16/18 1726  . mupirocin ointment (BACTROBAN) 2 % 1 application  1 application Nasal BID Lady Deutscher, MD   1 application at 56/21/30 [phone]  . ondansetron (ZOFRAN) injection 4 mg  4 mg Intravenous Q6H PRN Lady Deutscher, MD      . potassium chloride SA (K-DUR,KLOR-CON) CR tablet 20 mEq  20 mEq Oral BID Lady Deutscher, MD   20 mEq at 02/17/18 1128  . pravastatin (PRAVACHOL) tablet 80 mg  80 mg Oral Daily Lady Deutscher, MD   [phone]  mg at 02/17/18 1128  . QUEtiapine (SEROQUEL) tablet 25 mg  25 mg Oral QHS Lady Deutscher, MD   25 mg at 02/16/18 2218  . rivastigmine (EXELON) 4.6 mg/24hr 4.6 mg  4.6 mg Transdermal Daily Lady Deutscher, MD   4.6 mg at 02/17/18 1129  . silver sulfADIAZINE (SILVADENE) 1 % cream 1 application  1 application Topical Daily Lady Deutscher, MD   1 application at 03/08/75 1144  . sodium chloride flush (NS) 0.9 % injection 3 mL  3 mL Intravenous Q12H Lady Deutscher, MD   3 mL at 02/17/18 1143  . sodium chloride flush (NS) 0.9 % injection 3 mL  3 mL Intravenous PRN Lady Deutscher, MD      . traMADol Veatrice [place]) tablet 100 mg  100 mg Oral Q6H PRN Lady Deutscher, MD      . traMADol Veatrice [place]) tablet 50 mg  50 mg Oral Q6H PRN Lady Deutscher, MD   50 mg at 02/15/18 0836  . vitamin B-12 (CYANOCOBALAMIN) tablet 100 mcg  100 mcg Oral Daily Lady Deutscher, MD   100 mcg at 02/16/18 0902  . vitamin C (ASCORBIC ACID) tablet 1,000 mg  1,000 mg Oral Once per day on Mon Wed Fri Lady Deutscher, MD   1,000 mg at 02/17/18 2831     Discharge Medications: Please see discharge summary for a list of discharge medications.  Relevant Imaging Results:  Relevant Lab Results:   Additional Information SS#: [id number]  Candie Chroman, LCSW

## 2018-02-17 NOTE — Clinical Social Work Note (Signed)
Clinical Social Work Assessment  Patient Details  Name: [name] [name] MRN: [id number] Date of Birth: [birth year]  Date of referral:  02/17/18               Reason for consult:  Facility Placement, Discharge Planning                Permission sought to share information with:  Facility Sport and exercise psychologist, Family Supports Permission granted to share information::  Yes, Verbal Permission Granted  Name::     [name]  Agency::  SNF's  Relationship::  Daughter  Contact Information:  [phone]  Housing/Transportation Living arrangements for the past 2 months:  Single Family Home Source of Information:  Patient, Medical Team, Adult Children Patient Interpreter Needed:  None Criminal Activity/Legal Involvement Pertinent to Current Situation/Hospitalization:  No - Comment as needed Significant Relationships:  Adult Children Lives with:  Self Do you feel safe going back to the place where you live?  Yes Need for family participation in patient care:  Yes (Comment)  Care giving concerns:  PT recommending SNF once medically stable for discharge.   Social Worker assessment / plan:  CSW met with patient. No supports at bedside. CSW introduced role and explained that PT recommendations would be discussed. Patient is agreeable to SNF placement. No facility preferences. CSW provided patient with SNF list to review. She gave CSW permission to contact her daughter, [name] to discuss SNF placement as well. CSW spoke with [name]. She is agreeable to SNF placement. SHe stated patient has been to [facility] in the past. Patient had good experiences at [facility] and [facility]. CSW emailed Ms. Streeter a SNF list as well. [name] is also interested in a private duty list for once patient is discharged home from SNF. No further concerns. CSW encouraged patient and her daughter to contact CSW as needed. CSW will continue to follow patient and her daughter for  support and facilitate discharge to SNF once medically stable.  Employment status:  Retired Nurse, adult PT Recommendations:  [place] / Referral to community resources:  [place]  Patient/Family's Response to care:  Patient and her daughter agreeable to SNF placement. Patient's daughters supportive and involved in patient's care. Patient and her daughter appreciated social work intervention.  Patient/Family's Understanding of and Emotional Response to Diagnosis, Current Treatment, and Prognosis:  Patient and her daughter have a good understanding of the reason for admission and her need for SNF prior to returning home. Patient and her daughter appear happy with hospital care.  Emotional Assessment Appearance:  Appears stated age Attitude/Demeanor/Rapport:  Engaged, Gracious Affect (typically observed):  Accepting, Calm, Appropriate, Pleasant Orientation:  Oriented to Self, Oriented to Place, Oriented to  Time, Oriented to Situation Alcohol / Substance use:  Never Used Psych involvement (Current and /or in the community):  No (Comment)  Discharge Needs  Concerns to be addressed:  Care Coordination Readmission within the last 30 days:  No Current discharge risk:  Dependent with Mobility, Lives alone Barriers to Discharge:  Ship broker, Continued Medical Work up   Candie Chroman, LCSW 02/17/2018, 1:05 PM

## 2018-02-17 NOTE — Progress Notes (Signed)
Physical Therapy Treatment Patient Details Name: REYLENE STAUDER MRN: [id number] DOB: 1933-12-17 Today's Date: 02/17/2018    History of Present Illness CHASLYN EISEN is a 84 y.o. female with medical history significant of atrial fibrillation on anticoagulation, diabetes type 2 with severe peripheral artery disease status post interventions to the left popliteal artery in July 2018, Lewy body dementia followed by neurology, coronary artery disease status post coronary artery bypass graft in 2012, hypothyroidism, [place] retention, vitamin D deficiency, vitamin B12 deficiency and diabetic retinopathy who presents the emergency department complaining of shortness of breath.  She was initially called EMS due to back pain.  EMS noted that she was short of breath her sats were 83% she was put on 100% nonrebreather.  She does not have any oxygen at home.     PT Comments    Patient progressing with therapy this visit, increasing ambulation distance to 68' on RA with improved mechanics. Pt with memory deficits and lack of safety awareness, SNF recs still appropriate unless patient has 24/7 care at home. Min A for transfers, min guard for ambulation.      Follow Up Recommendations  SNF;Supervision/Assistance - 24 hour(unless 24/7 assist can be provided)     Equipment Recommendations  (TBD next venue)    Recommendations for Other Services       Precautions / Restrictions Precautions Precautions: Fall Precaution Comments: tangental speech Restrictions Weight Bearing Restrictions: No    Mobility  Bed Mobility Overal bed mobility: Needs Assistance Bed Mobility: Supine to Sit     Supine to sit: Supervision        Transfers Overall transfer level: Needs assistance Equipment used: Rolling walker (2 wheeled) Transfers: Sit to/from Stand Sit to Stand: Min assist         General transfer comment: min A to help power up from slightly elevated bed, providing stability as she stands.    Ambulation/Gait Ambulation/Gait assistance: Min guard Ambulation Distance (Feet): 80 Feet Assistive device: Rolling walker (2 wheeled) Gait Pattern/deviations: Step-through pattern;Decreased stride length;Trunk flexed Gait velocity: slow   General Gait Details: Patient with improved mechanics walking, min guard for safety. no c/o of SOB or chest pain.    Stairs             Wheelchair Mobility    Modified Rankin (Stroke Patients Only)       Balance Overall balance assessment: Needs assistance Sitting-balance support: No upper extremity supported;Feet supported Sitting balance-Leahy Scale: Fair     Standing balance support: Bilateral upper extremity supported Standing balance-Leahy Scale: Poor Standing balance comment: dependent on RW                            Cognition Arousal/Alertness: Awake/alert Behavior During Therapy: WFL for tasks assessed/performed Overall Cognitive Status: No family/caregiver present to determine baseline cognitive functioning                                 General Comments: pt appears coherent and oriented however pt very distracted with tangental speech and impaired short term memory      Exercises      General Comments        Pertinent Vitals/Pain Pain Assessment: No/denies pain    Home Living                      Prior Function  PT Goals (current goals can now be found in the care plan section) Acute Rehab PT Goals Patient Stated Goal: didn't state PT Goal Formulation: Patient unable to participate in goal setting Time For Goal Achievement: 03/01/18 Potential to Achieve Goals: Good    Frequency    Min 3X/week      PT Plan Current plan remains appropriate    Co-evaluation              AM-PAC PT "6 Clicks" Daily Activity  Outcome Measure  Difficulty turning over in bed (including adjusting bedclothes, sheets and blankets)?: Unable Difficulty moving  from lying on back to sitting on the side of the bed? : Unable Difficulty sitting down on and standing up from a chair with arms (e.g., wheelchair, bedside commode, etc,.)?: Unable Help needed moving to and from a bed to chair (including a wheelchair)?: A Little Help needed walking in hospital room?: A Little Help needed climbing 3-5 steps with a railing? : A Lot 6 Click Score: 11    End of Session Equipment Utilized During Treatment: Gait belt Activity Tolerance: Patient tolerated treatment well Patient left: in chair;with chair alarm set;with call bell/phone within reach Nurse Communication: Mobility status PT Visit Diagnosis: Unsteadiness on feet (R26.81)     Time: 8756-4332 PT Time Calculation (min) (ACUTE ONLY): 19 min  Charges:  $Gait Training: 8-22 mins                    G Codes:      Reinaldo Berber, PT, DPT Acute Rehab Services Pager: [phone]   Reinaldo Berber 02/17/2018, 9:10 AM

## 2018-02-17 NOTE — Progress Notes (Signed)
Patient stable, no concerns or complaints from patient. BLE a little more edematous tonight, +2.

## 2018-02-17 NOTE — Progress Notes (Signed)
Patient stable throughout the night, no need for oxygen, VSS, no concerns or complaints from patient.

## 2018-02-17 NOTE — Progress Notes (Signed)
Progress Note    [name] [name]  MVH:[id number] DOB: 1934-06-12  DOA: 02/14/2018 PCP: Lajean Manes, MD    Brief Narrative:    Medical records reviewed and are as summarized below:  [name] [name] is an 84 y.o. female with medical history significant of atrial fibrillation on anticoagulation, diabetes type 2 with severe peripheral artery disease status post interventions to the left popliteal artery in July 2018, Lewy body dementia followed by neurology, coronary artery disease status post coronary artery bypass graft in 2012, hypothyroidism, vitamin D deficiency, vitamin B12 deficiency and diabetic retinopathy who presents the emergency department complaining of shortness of breath.   She was started on BiPAP therapy and had significant improvement in addition to being giving 60 mg of IV Lasix. Plan for SNF when bed available.  PALLIATIVE CARE TO FOLLOW AT SNF    Assessment/Plan:   Principal Problem:   Acute diastolic CHF (congestive heart failure), NYHA class 3 (HCC) Active Problems:   Hypertension   CAD (coronary artery disease)   Bilateral carotid artery stenosis   Rhegmatogenous retinal detachment of right eye   Obesity (BMI 30-39.9)   Hypothyroidism   DM (diabetes mellitus), type 2 with peripheral vascular complications (HCC)   Hyperlipemia   Lewy body dementia with behavioral disturbance   Chronic atrial fibrillation (HCC)   Hypertrophic cardiomyopathy (HCC)   Gastroparesis due to DM ([place])   Urinary retention   Vitamin D deficiency   Vitamin B 12 deficiency   Pulmonary hypertension (HCC)   Acute respiratory failure with hypoxia (HCC)   Palliative care encounter  Acute diastolic congestive heart failure New York Heart Association association class III:  -was not on any Lasix at home.   -echo: - The right ventricular systolic pressure was increased consistent with moderate pulmonary hypertension. -not sure how compliant she is with low salt diet -if I/O correct down  >6L yesterday -PO lasic per cards with close BMP follow up  Acute respiratory failure -weaned off O2  Chronic atrial fibrillation:  -rate controlled -Eliquis for anticoagulation at home.  Pulmonary hypertension: Related to diastolic congestive heart failure.  Continue diuresis patient will likely need Lasix at discharge -echo:  The right ventricular systolic pressure was increased consistent with moderate pulmonary hypertension.  Coronary artery disease: Status post coronary artery bypass graft in 2012.  Patient had a negative stress test in late 2015.  Further work-up per cardiology.  Diabetes mellitus type 2 with peripheral vascular complications on chronic insulin therapy:  -SSI, glipizide-- ? jardiance at d/c? -will need to STOP actos upon d/c   Lewy body dementia with behavioral disturbance as noted by Dr. Brett Fairy and her notes in August 2018.  Currently patient is on Exelon patch as well as Namenda.  Has a caretaker 3 days a week from best I can gather and has some home health as well.  Has a daughter and granddaughter who provides care several days a week from 9 AM to 2 PM. -plan for SNF -palliative care evaluation appreciated  Hypertension: Continue home medications.   Bilateral carotid artery stenosis status post carotid endarterectomy in January 2017. -has appointment with Dr. Luther Parody office mid June  Retinal detachment of right eye: Related to her diabetes  -eye gtts  Anemia- macrocytic -B12 replaced -POC stool studies pending -folate pending -Fe ok -LDH/haptoglobin- pending  Obesity with a BMI of 30-39 Body mass index is 33.9 kg/m.  Hypothyroidism:  -TSH normal   Hyperlipidemia: Continue pravastatin.  Urinary retention: Patient is  on Myrbetriq this is a medication that is known to cause shortness of breath.  It may not be the best medication for her urinary retention.  .  Vitamin D deficiency: Continue vitamin D therapy.  vitamin B12  deficiency: Continue vitamin B12 therapy orally.    Family Communication/Anticipated D/C date and plan/Code Status   DVT prophylaxis: eliquis Code Status: DNR Family Communication: daughter 6/4 Disposition Plan: SNF when bed availbe   Medical Consultants:    Cards.  Palliative care    Subjective:   No current complaints-- breathing well-- excited to be her birthday  Objective:    Vitals:   02/16/18 1933 02/16/18 2217 02/17/18 0555 02/17/18 0800  BP: (!) 119/44 (!) 137/55 (!) 147/65 (!) 155/67  Pulse: 64 65 67 61  Resp: 18  18 16   Temp: 98.4 F (36.9 C)  98.2 F (36.8 C) (!) 97.1 F (36.2 C)  TempSrc: Oral  Oral Oral  SpO2: 96%  97% 97%  Weight:   78.7 kg (173 lb 9.6 oz)   Height:        Intake/Output Summary (Last 24 hours) at 02/17/2018 1444 Last data filed at 02/17/2018 0556 Gross per 24 hour  Intake 596 ml  Output 300 ml  Net 296 ml   Filed Weights   02/15/18 0513 02/16/18 0615 02/17/18 0555  Weight: 80.9 kg (178 lb 6.4 oz) 81.6 kg (180 lb) 78.7 kg (173 lb 9.6 oz)    Exam: In chair, pleasant cooperative rrr Clear, no increased work of breathing No LE edema Normal mood, tearful at times  Data Reviewed:   I have personally reviewed following labs and imaging studies:  Labs: Labs show the following:   Basic Metabolic Panel: Recent Labs  Lab 02/14/18 0730 02/14/18 0804 02/15/18 0427 02/16/18 0554 02/17/18 0545  NA 139  --  139 137 139  K 4.4  --  4.2 3.8 4.3  CL 106  --  105 99* 107  CO2 25  --  30 29 27   GLUCOSE 173*  --  174* 164* 125*  BUN 20  --  22* 25* 31*  CREATININE 1.04*  --  1.20* 1.12* 1.23*  CALCIUM 9.1  --  8.6* 8.6* 8.4*  MG  --  1.8  --   --   --    GFR Estimated Creatinine Clearance: 32.7 mL/min (A) (by C-G formula based on SCr of 1.23 mg/dL (H)). Liver Function Tests: Recent Labs  Lab 02/17/18 0545  AST 22  ALT 15  ALKPHOS 80  BILITOT 0.5  PROT 6.0*  ALBUMIN 2.8*   No results for input(s): LIPASE, AMYLASE  in the last 168 hours. No results for input(s): AMMONIA in the last 168 hours. Coagulation profile No results for input(s): INR, PROTIME in the last 168 hours.  CBC: Recent Labs  Lab 02/14/18 0730 02/16/18 0554 02/16/18 1354 02/17/18 0545  WBC 9.7 7.3  --  6.3  NEUTROABS 6.5  --   --   --   HGB 9.8* 8.7* 8.9* 9.0*  HCT 32.6* 27.9* 28.9* 29.6*  MCV 113.6* 110.7*  --  113.8*  PLT 228 190  --  202   Cardiac Enzymes: Recent Labs  Lab 02/14/18 1300 02/14/18 1929  TROPONINI 0.03* 0.04*   BNP (last 3 results) No results for input(s): PROBNP in the last 8760 hours. CBG: Recent Labs  Lab 02/16/18 1215 02/16/18 1625 02/16/18 2059 02/17/18 0739 02/17/18 1227  GLUCAP 135* 113* 170* 145* 146*   D-Dimer: No results  for input(s): DDIMER in the last 72 hours. Hgb A1c: No results for input(s): HGBA1C in the last 72 hours. Lipid Profile: No results for input(s): CHOL, HDL, LDLCALC, TRIG, CHOLHDL, LDLDIRECT in the last 72 hours. Thyroid function studies: No results for input(s): TSH, T4TOTAL, T3FREE, THYROIDAB in the last 72 hours.  Invalid input(s): FREET3 Anemia work up: Recent Labs    02/16/18 1123 02/17/18 0545  VITAMINB12 1,304*  --   FERRITIN  --  61  TIBC  --  283  IRON  --  35   Sepsis Labs: Recent Labs  Lab 02/14/18 0730 02/14/18 0745 02/16/18 0554 02/17/18 0545  WBC 9.7  --  7.3 6.3  LATICACIDVEN  --  0.71  --   --     Microbiology Recent Results (from the past 240 hour(s))  Culture, blood (routine x 2)     Status: None (Preliminary result)   Collection Time: 02/14/18  8:38 AM  Result Value Ref Range Status   Specimen Description BLOOD RIGHT FOREARM  Final   Special Requests   Final    BOTTLES DRAWN AEROBIC AND ANAEROBIC Blood Culture adequate volume   Culture   Final    NO GROWTH 3 DAYS Performed at [facility], [place] [address] [place], [place] [address]    Report Status PENDING  Incomplete  Urine culture     Status: None   Collection  Time: 02/14/18  9:34 AM  Result Value Ref Range Status   Specimen Description URINE, CLEAN CATCH  Final   Special Requests NONE  Final   Culture   Final    NO GROWTH Performed at [facility], [address], [place], [place] [address]    Report Status 02/15/2018 FINAL  Final  Culture, blood (routine x 2)     Status: None (Preliminary result)   Collection Time: 02/14/18  7:29 PM  Result Value Ref Range Status   Specimen Description BLOOD RIGHT ANTECUBITAL  Final   Special Requests   Final    BOTTLES DRAWN AEROBIC ONLY Blood Culture adequate volume   Culture   Final    NO GROWTH 3 DAYS Performed at [facility], [place] [address] [place], [place] [address]    Report Status PENDING  Incomplete  MRSA PCR Screening     Status: Abnormal   Collection Time: 02/14/18 10:01 PM  Result Value Ref Range Status   MRSA by PCR POSITIVE (A) NEGATIVE Final    Comment:        The GeneXpert MRSA Assay (FDA approved for NASAL specimens only), is one component of a comprehensive MRSA colonization surveillance program. It is not intended to diagnose MRSA infection nor to guide or monitor treatment for MRSA infections. RESULT CALLED TO, READ BACK BY AND VERIFIED WITH: RASUL,G RN 02/14/18 AT 2348 SKEEN,P     Procedures and diagnostic studies:  No results found.  Medications:   . acetaminophen  1,000 mg Oral Q6H  . apixaban  5 mg Oral BID  . brimonidine  1 drop Both Eyes BID   And  . timolol  1 drop Both Eyes BID  . Chlorhexidine Gluconate Cloth  6 each Topical Q0600  . cholecalciferol  1,000 Units Oral Once per day on Mon Tue Wed Fri Sat  . felodipine  10 mg Oral Daily  . ferrous sulfate  325 mg Oral Q breakfast  . FLUoxetine  20 mg Oral q morning - 10a  . furosemide  40 mg  Oral Daily  . glipiZIDE  5 mg Oral QHS  . insulin aspart  0-15 Units Subcutaneous TID WC  . insulin glargine  10 Units Subcutaneous QHS  . levothyroxine  125 mcg Oral QAC breakfast  . losartan  25 mg  Oral Daily  . memantine  10 mg Oral BID  . metoprolol tartrate  25 mg Oral BID  . mirabegron ER  50 mg Oral QPM  . mupirocin ointment  1 application Nasal BID  . potassium chloride  20 mEq Oral BID  . pravastatin  80 mg Oral Daily  . QUEtiapine  25 mg Oral QHS  . rivastigmine  4.6 mg Transdermal Daily  . silver sulfADIAZINE  1 application Topical Daily  . sodium chloride flush  3 mL Intravenous Q12H  . vitamin B-12  100 mcg Oral Daily  . vitamin C  1,000 mg Oral Once per day on Mon Wed Fri   Continuous Infusions: . sodium chloride       LOS: 3 days   Geradine Girt  [facility]   *Please refer to [email or web address], password [email or web address] to get updated schedule on who will round on this patient, as hospitalists switch teams weekly. If 7PM-7AM, please contact night-coverage at [email or web address], password [email or web address] for any overnight needs.  02/17/2018, 2:44 PM

## 2018-02-17 NOTE — Progress Notes (Addendum)
Progress Note  Patient Name: [name] [name] Date of Encounter: 02/17/2018  Primary Cardiologist: Larae Grooms, MD   Subjective   Doing well today. It is her [employer]. Very talkative. Denies dyspnea. No chest pain.   Inpatient Medications    Scheduled Meds: . acetaminophen  1,000 mg Oral Q6H  . apixaban  5 mg Oral BID  . brimonidine  1 drop Both Eyes BID   And  . timolol  1 drop Both Eyes BID  . Chlorhexidine Gluconate Cloth  6 each Topical Q0600  . cholecalciferol  1,000 Units Oral Once per day on Mon Tue Wed Fri Sat  . felodipine  10 mg Oral Daily  . ferrous sulfate  325 mg Oral Q breakfast  . FLUoxetine  20 mg Oral q morning - 10a  . furosemide  40 mg Oral Daily  . glipiZIDE  5 mg Oral QHS  . insulin aspart  0-15 Units Subcutaneous TID WC  . insulin glargine  10 Units Subcutaneous QHS  . levothyroxine  125 mcg Oral QAC breakfast  . losartan  25 mg Oral Daily  . memantine  10 mg Oral BID  . metoprolol tartrate  25 mg Oral BID  . mirabegron ER  50 mg Oral QPM  . mupirocin ointment  1 application Nasal BID  . potassium chloride  20 mEq Oral BID  . pravastatin  80 mg Oral Daily  . QUEtiapine  25 mg Oral QHS  . rivastigmine  4.6 mg Transdermal Daily  . silver sulfADIAZINE  1 application Topical Daily  . sodium chloride flush  3 mL Intravenous Q12H  . vitamin B-12  100 mcg Oral Daily  . vitamin C  1,000 mg Oral Once per day on Mon Wed Fri   Continuous Infusions: . sodium chloride     PRN Meds: sodium chloride, ondansetron (ZOFRAN) IV, sodium chloride flush, traMADol, traMADol   Vital Signs    Vitals:   02/16/18 1933 02/16/18 2217 02/17/18 0555 02/17/18 0800  BP: (!) 119/44 (!) 137/55 (!) 147/65 (!) 155/67  Pulse: 64 65 67 61  Resp: 18  18 16   Temp: 98.4 F (36.9 C)  98.2 F (36.8 C) (!) 97.1 F (36.2 C)  TempSrc: Oral  Oral Oral  SpO2: 96%  97% 97%  Weight:   173 lb 9.6 oz (78.7 kg)   Height:        Intake/Output Summary (Last 24 hours) at  02/17/2018 1053 Last data filed at 02/17/2018 0556 Gross per 24 hour  Intake 716 ml  Output 1250 ml  Net -534 ml   Filed Weights   02/15/18 0513 02/16/18 0615 02/17/18 0555  Weight: 178 lb 6.4 oz (80.9 kg) 180 lb (81.6 kg) 173 lb 9.6 oz (78.7 kg)    Telemetry    Atrial fibrillation w/ CVR in the 60s-70s - Personally Reviewed  ECG    Atrial fibrillation 84 bpm  - Personally Reviewed  Physical Exam   GEN: No acute distress.   Neck: No JVD Cardiac: RRR, no murmurs, rubs, or gallops.  Respiratory: Clear to auscultation bilaterally. GI: Soft, nontender, non-distended  MS: No edema; No deformity. Neuro:  Nonfocal  Psych: Normal affect   Labs    Chemistry Recent Labs  Lab 02/15/18 0427 02/16/18 0554 02/17/18 0545  NA 139 137 139  K 4.2 3.8 4.3  CL 105 99* 107  CO2 30 29 27   GLUCOSE 174* 164* 125*  BUN 22* 25* 31*  CREATININE 1.20* 1.12* 1.23*  CALCIUM 8.6* 8.6* 8.4*  PROT  --   --  6.0*  ALBUMIN  --   --  2.8*  AST  --   --  22  ALT  --   --  15  ALKPHOS  --   --  80  BILITOT  --   --  0.5  GFRNONAA 41* 45* 40*  GFRAA 48* 52* 46*  ANIONGAP 4* 9 5     Hematology Recent Labs  Lab 02/14/18 0730 02/16/18 0554 02/16/18 1354 02/17/18 0545  WBC 9.7 7.3  --  6.3  RBC 2.87* 2.52*  --  2.60*  HGB 9.8* 8.7* 8.9* 9.0*  HCT 32.6* 27.9* 28.9* 29.6*  MCV 113.6* 110.7*  --  113.8*  MCH 34.1* 34.5*  --  34.6*  MCHC 30.1 31.2  --  30.4  RDW 15.6* 14.9  --  15.1  PLT 228 190  --  202    Cardiac Enzymes Recent Labs  Lab 02/14/18 1300 02/14/18 1929  TROPONINI 0.03* 0.04*    Recent Labs  Lab 02/14/18 0742  TROPIPOC 0.02     BNP Recent Labs  Lab 02/14/18 0730  BNP 591.4*     DDimer No results for input(s): DDIMER in the last 168 hours.   Radiology    No results found.  Cardiac Studies   ECHO: 07/2014 - Left ventricle: The cavity size was normal. There was moderate concentric hypertrophy. Severe septal hypertrophy - suggestive  of hypertrophic cardiomyopathy. No LVOT obstruction is noted - there is a 15 mmHg rest gradient across the AOV. Systolic function was vigorous. The estimated ejection fraction was in the range of 65% to 70%. Wall motion was normal; there were no regional wall motion abnormalities. Doppler parameters are consistent with abnormal left ventricular relaxation (grade 1 diastolic dysfunction). The E/e&' ratio is between 8-15, suggesting indeterminate LV filling pressure. - Aortic valve: Trileaflet. Sclerosis without stenosis. There was no regurgitation. - Left atrium: The atrium was moderately dilated (40 ml/m2). - Right atrium: The atrium was mildly dilated. - Tricuspid valve: There was moderate regurgitation. - Pulmonary arteries: PA peak pressure: 42 mm Hg (S). Impressions: - LVEF 65-70%, moderate LVH, severe septal hypertrophy concerning for HCM, no significant LVOT gradient, aortic sclerosis with mean gradient of 15 mmHg across the valve. Moderate LAE. Moderate TR, RVSP 42 mmHg.   Patient Profile     84 y.o. female with chronic diastolic heart failure, permanent atrial fibrillation, coronary artery disease s/p CABG in 2012, bilateral carotid artery stenosis,leftSFA obstruction, hypertrophic cardiomyopathy likely due to hypertension, hyperlipidemia, mild pulmonary artery hypertension and dementia, admitted for acute on chronic diastolic CHF.   Assessment & Plan    1. Diastolic HF: presented with acute CHF. Diuresed with Lasix. I/Os net negative 6L since admit. Now on PO lasix. Appears euvolemic on exam. No dyspnea. Slight bump in SCr from 1.04 on admit to 1.23. Can get f/u BMP as outpatient.    2. HCM: suspect likely from HTN and not true genetic HCM. She is on oral BB with controlled HR and BP.   3. Permanent Atrial Fibrillation: rate is controlled in the 60-70s w/ metoprolol. She is on Eliquis, 5 mg BID, for stroke prophylaxis. She does have anemia but Hgb  appears to be trending up slightly, now at 9.0. We can continue to monitor as outpatient.  4. CAD: s/p CABG in 2012. Negative stress test in 2015. No recent CP. Continue medical therapy.   5. DM: Controlled. Hgb A1c is 5.7.  Actos has been discontinued due to CHF. Still on glipizide.  If additional oral antidiabetic agents are needed, recommend Jardiance (SGLT2 inhibitor)  6. Anemia: Hgb trending up, slowly 8.7>.8.9>>9.0.   7. Dementia: per IM. Plan is for d/c to SNF.   8. CODE Status: changed to DNR. Met with Palliative Care Team 02/16/18. Plan is for palliative care to follow as outpatient once discharged to SNF.    For questions or updates, please contact [place] Please consult [email or web address] for contact info under Cardiology/STEMI.      Signed, Lyda Jester, PA-C  02/17/2018, 10:53 AM    I have seen and examined the patient along with Lyda Jester, PA-C , PA NP.  I have reviewed the chart, notes and new data.  I agree with PA/NP's note.  Key new complaints: She is feeling well and is in great spirits today, her 84nd birthday Key examination changes: Regular rhythm, well rate controlled; no evidence of hypervolemia by physical exam Key new findings / data: Minimal increase in creatinine to 1.2  PLAN: Continue same cardiac medications.  Please reconsult for other questions, will sign off.  Sanda Klein, MD, [place] [phone] 02/17/2018, 11:25 AM

## 2018-02-17 NOTE — Clinical Social Work Placement (Signed)
   CLINICAL SOCIAL WORK PLACEMENT  NOTE  Date:  02/17/2018  Patient Details  Name: [name] [name] MRN: [id number] Date of Birth: Dec 31, 1933  Clinical Social Work is seeking post-discharge placement for this patient at the [place] level of care (*CSW will initial, date and re-position this form in  chart as items are completed):  Yes   Patient/family provided with [place] Work Department's list of facilities offering this level of care within the geographic area requested by the patient (or if unable, by the patient's family).  Yes   Patient/family informed of their freedom to choose among providers that offer the needed level of care, that participate in Medicare, Medicaid or managed care program needed by the patient, have an available bed and are willing to accept the patient.  Yes   Patient/family informed of [facility]'s ownership interest in [facility] and [facility], as well as of the fact that they are under no obligation to receive care at these facilities.  PASRR submitted to EDS on 02/17/18     PASRR number received on       Existing PASRR number confirmed on 02/17/18     FL2 transmitted to all facilities in geographic area requested by pt/family on 02/17/18     FL2 transmitted to all facilities within larger geographic area on       Patient informed that his/her managed care company has contracts with or will negotiate with certain facilities, including the following:            Patient/family informed of bed offers received.  Patient chooses bed at       Physician recommends and patient chooses bed at      Patient to be transferred to   on  .  Patient to be transferred to facility by       Patient family notified on   of transfer.  Name of family member notified:        PHYSICIAN Please sign FL2, Please sign DNR     Additional Comment:    _______________________________________________ Candie Chroman,  LCSW 02/17/2018, 1:11 PM

## 2018-02-18 DIAGNOSIS — R5381 Other malaise: Secondary | ICD-10-CM | POA: Diagnosis not present

## 2018-02-18 DIAGNOSIS — I5031 Acute diastolic (congestive) heart failure: Secondary | ICD-10-CM | POA: Diagnosis not present

## 2018-02-18 DIAGNOSIS — I5032 Chronic diastolic (congestive) heart failure: Secondary | ICD-10-CM

## 2018-02-18 DIAGNOSIS — I2510 Atherosclerotic heart disease of native coronary artery without angina pectoris: Secondary | ICD-10-CM | POA: Diagnosis not present

## 2018-02-18 DIAGNOSIS — I110 Hypertensive heart disease with heart failure: Principal | ICD-10-CM

## 2018-02-18 DIAGNOSIS — G3183 Dementia with Lewy bodies: Secondary | ICD-10-CM | POA: Diagnosis not present

## 2018-02-18 DIAGNOSIS — R0602 Shortness of breath: Secondary | ICD-10-CM | POA: Diagnosis not present

## 2018-02-18 DIAGNOSIS — E1151 Type 2 diabetes mellitus with diabetic peripheral angiopathy without gangrene: Secondary | ICD-10-CM | POA: Diagnosis not present

## 2018-02-18 DIAGNOSIS — I422 Other hypertrophic cardiomyopathy: Secondary | ICD-10-CM | POA: Diagnosis not present

## 2018-02-18 DIAGNOSIS — I509 Heart failure, unspecified: Secondary | ICD-10-CM | POA: Diagnosis not present

## 2018-02-18 DIAGNOSIS — Z515 Encounter for palliative care: Secondary | ICD-10-CM | POA: Diagnosis not present

## 2018-02-18 DIAGNOSIS — J9601 Acute respiratory failure with hypoxia: Secondary | ICD-10-CM | POA: Diagnosis not present

## 2018-02-18 DIAGNOSIS — F0281 Dementia in other diseases classified elsewhere with behavioral disturbance: Secondary | ICD-10-CM | POA: Diagnosis not present

## 2018-02-18 LAB — GLUCOSE, CAPILLARY
GLUCOSE-CAPILLARY: 58 mg/dL — AB (ref 65–99)
Glucose-Capillary: 74 mg/dL (ref 65–99)
Glucose-Capillary: 121 mg/dL — ABNORMAL HIGH (ref 65–99)
Glucose-Capillary: 142 mg/dL — ABNORMAL HIGH (ref 65–99)

## 2018-02-18 LAB — HAPTOGLOBIN: HAPTOGLOBIN: 193 mg/dL (ref 34–200)

## 2018-02-18 MED ORDER — TRAMADOL HCL 50 MG PO TABS: 50.0000 mg | ORAL_TABLET | Freq: Four times a day (QID) | ORAL | 0 refills | Status: AC | PRN

## 2018-02-18 MED ORDER — POTASSIUM CHLORIDE CRYS ER 20 MEQ PO TBCR: 20.0000 meq | EXTENDED_RELEASE_TABLET | Freq: Every day | ORAL | Status: DC

## 2018-02-18 MED ORDER — VITAMIN B 12 100 MCG PO LOZG: 1.0000 | LOZENGE | Freq: Every day | ORAL | Status: AC

## 2018-02-18 MED ORDER — INSULIN ASPART 100 UNIT/ML ~~LOC~~ SOLN: 0.0000 [IU] | Freq: Three times a day (TID) | SUBCUTANEOUS | 11 refills | Status: DC

## 2018-02-18 MED ORDER — FUROSEMIDE 40 MG PO TABS: 40.0000 mg | ORAL_TABLET | Freq: Every day | ORAL | Status: AC

## 2018-02-18 NOTE — Progress Notes (Signed)
Inpatient Rehabilitation Admissions Coordinator  Patient is not in need of an inpt rehab admit at this functional level. Please refer to Dr. Letta Pate consult. I have notified RN CM, Hassan Rowan and SW, Clarise Cruz. We will sign off at this time.  Danne Baxter, RN, MSN Rehab Admissions Coordinator [phone] 02/18/2018 3:40 PM

## 2018-02-18 NOTE — Progress Notes (Signed)
Daily Progress Note   Patient Name: [name] [name]       Date: 02/18/2018 DOB: 09-Feb-1934  Age: 84 y.o. MRN#: [id number] Attending Physician: Mendel Corning, MD Primary Care Physician: Lajean Manes, MD Admit Date: 02/14/2018  Reason for Consultation/Follow-up: Establishing goals of care  Subjective: At family's request we held a conference call so that more of her children could be involved in the [place] conversation.  We discussed dementia, heart failure and code status.  Family was appreciative of the information and in support of the decision to change code status to DNR - with full scope treatment.  Family then discussed the anxiety they were feeling regarding discharge from the hospital.  Two SNFs were offered as options.  One SNF the patient has been to before and the family feels it is very unsafe.  The second option they are also unhappy with.  Family expressed that there are SNFs that may be acceptable, but their strong preference is to actually bring the patient home with 24 hour care.  They are in negotiations with a CNA to move into their mother's home and stay 24x7.  We discussed full home health services (Aid, RN, PT/OT, SW).  We discussed placement from home if necessary (Thru their PCP).  We also discussed ALFs and what they may offer.    Discussed patient's abilities with Physical Therapy who had just evaluated the patient.  They felt that the patient would benefit from working on Transfers, and cognition queues to maximize safety before returning home, and that possibly she would be well served by CIR.   Assessment: Pleasant 84 yo female with mild to moderate dementia (without behavioral disturbance).  CHF exacerbation.  Now diuresed and stable.  Would benefit from focused PT/OT, as  well as monitoring of labs and respiration capability before discharging to home with family and private care.   Patient Profile/HPI:  84 y.o. female  with past medical history of CHF, pulmonary HTN, atrial fibrillation, dementia, bilateral carotid stenosis, and diabetes who was admitted on 02/14/2018 with shortness of breath.  She was found to have a heart failure exacerbation and required bipap.    Length of Stay: 4  Current Medications: Scheduled Meds:  . acetaminophen  1,000 mg Oral Q6H  . apixaban  5 mg Oral BID  . brimonidine  1 drop Both Eyes BID   And  . timolol  1 drop Both Eyes BID  . Chlorhexidine Gluconate Cloth  6 each Topical Q0600  . cholecalciferol  1,000 Units Oral Once per day on Mon Tue Wed Fri Sat  . felodipine  10 mg Oral Daily  . ferrous sulfate  325 mg Oral Q breakfast  . FLUoxetine  20 mg Oral q morning - 10a  . furosemide  40 mg Oral Daily  . insulin aspart  0-15 Units Subcutaneous TID WC  . insulin glargine  10 Units Subcutaneous QHS  . levothyroxine  125 mcg Oral QAC breakfast  . losartan  25 mg Oral Daily  . memantine  10 mg Oral BID  . metoprolol tartrate  25 mg Oral BID  . mirabegron ER  50 mg Oral QPM  . mupirocin ointment  1 application Nasal BID  . potassium chloride  20 mEq Oral BID  . pravastatin  80 mg Oral Daily  . QUEtiapine  25 mg Oral QHS  . rivastigmine  4.6 mg Transdermal Daily  . silver sulfADIAZINE  1 application Topical Daily  . sodium chloride flush  3 mL Intravenous Q12H  . vitamin B-12  100 mcg Oral Daily  . vitamin C  1,000 mg Oral Once per day on Mon Wed Fri    Continuous Infusions: . sodium chloride      PRN Meds: sodium chloride, ondansetron (ZOFRAN) IV, sodium chloride flush, traMADol, traMADol    Vital Signs: BP (!) 142/63 (BP Location: Right Arm)   Pulse 71   Temp 98.2 F (36.8 C) (Oral)   Resp 18   Ht 5' (1.524 m)   Wt 81 kg (178 lb 9.6 oz)   SpO2 99%   BMI 34.88 kg/m  SpO2: SpO2: 99 % O2 Device: O2  Device: Room Air O2 Flow Rate: O2 Flow Rate (L/min): 3 L/min  Intake/output summary:   Intake/Output Summary (Last 24 hours) at 02/18/2018 1117 Last data filed at 02/18/2018 1002 Gross per 24 hour  Intake 963 ml  Output 2700 ml  Net -1737 ml   LBM: Last BM Date: 02/16/18 Baseline Weight: Weight: 82.4 kg (181 lb 9.6 oz) Most recent weight: Weight: 81 kg (178 lb 9.6 oz)       Palliative Assessment/Data:  50%    Flowsheet Rows     Most Recent Value  Intake Tab  Referral Department  Hospitalist  Unit at Time of Referral  Med/Surg Unit  Palliative Care Primary Diagnosis  Cardiac  Date Notified  02/14/18  Palliative Care Type  New Palliative care  Reason for referral  Clarify Goals of Care  Date of Admission  02/14/18  Date first seen by Palliative Care  02/16/18  # of days Palliative referral response time  2 Day(s)  # of days IP prior to Palliative referral  0  Clinical Assessment  Psychosocial & Spiritual Assessment  Palliative Care Outcomes      Patient Active Problem List   Diagnosis Date Noted  . Acute respiratory failure with hypoxia ([place])   . Palliative care encounter   . Acute diastolic CHF (congestive heart failure), NYHA class 3 ([place]) 02/14/2018  . Urinary retention 02/14/2018  . Vitamin D deficiency 02/14/2018  . Vitamin B 12 deficiency 02/14/2018  . Pulmonary hypertension ([place]) 02/14/2018  . PAD (peripheral artery disease) ([place]) 04/08/2017  . Late onset Alzheimer's disease without behavioral disturbance 02/12/2016  . Gastroparesis due to DM ([place]) 02/12/2016  . Diabetes mellitus due  to underlying condition with diabetic retinopathy 02/12/2016  . Carotid stenosis 10/01/2015  . Abnormality of gait 08/13/2015  . Hypertrophic cardiomyopathy ([place]) 08/30/2014  . Other specified transient cerebral ischemias 08/02/2014  . Chronic atrial fibrillation ([place]) 08/02/2014  . Lewy body dementia with behavioral disturbance 02/21/2014  . Peripheral vascular disease ([place])  01/18/2014  . [place]us post total shoulder arthroplasty 12/15/2013  . DM (diabetes mellitus), type 2 with peripheral vascular complications ([place]) 07/05/1172  . Constipation 12/08/2013  . Hyperlipemia 12/08/2013  . Dementia 12/08/2013  . Hypothyroidism 10/30/2013  . Osteoarthritis of right shoulder 10/07/2013  . Fall at home 05/15/2013  . Moderate vision impairment-both eyes 05/15/2013  . Obesity (BMI 30-39.9) 05/15/2013  . Rhegmatogenous retinal detachment of right eye 01/10/2013  . Hypertension   . Thyroid disease   . Depression   . IDDM (insulin dependent diabetes mellitus) ([place])   . GERD (gastroesophageal reflux disease)   . CAD (coronary artery disease)   . Bilateral carotid artery stenosis   . Obesity   . Degenerative joint disease   . Hiatal hernia     Palliative Care Plan    Recommendations/Plan:  Agree with on-going PT/OT.  If patient d/c to home she will need 24 hour care and full home health services.   Family's goal is to take her directly home.    Consider CIR evaluation   Goals of Care and Additional Recommendations:  Limitations on Scope of Treatment: Full Scope Treatment  Code Status:  DNR with full scope treatment.  Prognosis:   Unable to determine   Discharge Planning:  To Be Determined  Perhaps CIR, then eventually home with full home health services.  Care plan was discussed with Family, PT, [facility] attending.  Thank you for allowing the Palliative Medicine Team to assist in the care of this patient.  Total time spent:  60 min.     Greater than 50%  of this time was spent counseling and coordinating care related to the above assessment and plan.  Florentina Jenny, PA-C Palliative Medicine  Please contact Palliative MedicineTeam phone at [phone] for questions and concerns between 7 am - 7 pm.   Please see AMION for individual provider pager numbers.

## 2018-02-18 NOTE — Progress Notes (Signed)
Called report to Producer, television/film/video at [employer] at [employer]. Reviewed AVS with patient. Answered her questions. Pt is stable and ready for discharge. Waiting on transport.

## 2018-02-18 NOTE — Progress Notes (Signed)
Hypoglycemic Event  CBG: 58  Treatment: juice/crackers Symptoms: No symtoms Follow-up CBG: Time:0826 CBG Result:74  Possible Reasons for Event: No eating a lot  Comments/MD notified:    Ilene Qua

## 2018-02-18 NOTE — Progress Notes (Signed)
[facility]                                                                              Patient Demographics  [name] [name], is a 84 y.o. female, DOB - [birth year], LOV:[id number]  Admit date - 02/14/2018   Admitting Physician Lady Deutscher, MD  Outpatient Primary MD for the patient is Lajean Manes, MD  Outpatient specialists:   LOS - 4  days   Medical records reviewed and are as summarized below:    Chief Complaint  Patient presents with  . Shortness of Breath       Brief summary   For complete details please refer to admission H and P, but in brief [name] [name] an 84 y.o.femalewith medical history significant ofatrial fibrillation on anticoagulation, diabetes type 2 with severe peripheral artery disease status post interventions to the left popliteal artery in July 2018, Lewy body dementia followed by neurology, coronary artery disease status post coronary artery bypass graft in 2012, hypothyroidism, vitamin D deficiency, vitamin B12 deficiency and diabetic retinopathy who presents the emergency department complaining of shortness of breath. She was started on BiPAP therapy and had significant improvement in addition to being giving 60 mg of IV Lasix  Assessment & Plan   Acute hypoxic respiratory failure with acute diastolic CHF patient presented with shortness of breath, -Patient presented with shortness of breath and hypoxia, was placed briefly on BiPAP.  She was also given IV Lasix 60 mg for diuresis with significant improvement -Patient was not on any Lasix at home.  2D echo showed right ventricular systolic pressure increased consistent with moderate pulmonary hypertension. -Patient did well with Lasix diuresis, negative balance of 7.8 L.  Weight down from 181 on admission-> 178lbs -Transition to oral Lasix 40 mg daily, follow BMET -O2 sats improved, currently 99% on room air  Chronic atrial fibrillation -Rate controlled, continue  Lopressor - continue Eliquis for anticoagulation   Pulmonary hypertension: Moderate per 2D echo, history of CAD, CABG in 2012 -Continue diuresis, transition to oral Lasix 40 mg daily, follow BMET - outpatient follow-up with cardiology  Diabetes mellitus type 2, uncontrolled with insulin-dependent -Actos and glipizide discontinued, continue sliding scale insulin, Lantus   Hyperlipidemia: Continue pravastatin  Obesity, BMI 30-39 -BMI 33.9, continue diet and weight control   Vitamin D deficiency, vitamin B12 deficiency: Continue supplements  Macrocytic anemia -B12 folate currently normal limits, patient is on B12 supplement   Code Status: DNR DVT Prophylaxis: Apixaban Family Communication: Discussed in detail with the patient, all imaging results, lab results explained to the patient    Disposition Plan: Pending facility bed when available. Family interested in different skilled nursing facility, CIR also consulted.  Time Spent in minutes 35 minutes  Procedures:  2D echo  Consultants:   Cardiology Palliative medicine CIR  Antimicrobials:      Medications  Scheduled Meds: . acetaminophen  1,000 mg Oral Q6H  . apixaban  5 mg Oral BID  . brimonidine  1 drop Both Eyes BID   And  . timolol  1 drop Both Eyes BID  . Chlorhexidine Gluconate Cloth  6 each Topical Q0600  .  cholecalciferol  1,000 Units Oral Once per day on Mon Tue Wed Fri Sat  . felodipine  10 mg Oral Daily  . ferrous sulfate  325 mg Oral Q breakfast  . FLUoxetine  20 mg Oral q morning - 10a  . furosemide  40 mg Oral Daily  . insulin aspart  0-15 Units Subcutaneous TID WC  . insulin glargine  10 Units Subcutaneous QHS  . levothyroxine  125 mcg Oral QAC breakfast  . losartan  25 mg Oral Daily  . memantine  10 mg Oral BID  . metoprolol tartrate  25 mg Oral BID  . mirabegron ER  50 mg Oral QPM  . mupirocin ointment  1 application Nasal BID  . potassium chloride  20 mEq Oral BID  . pravastatin   80 mg Oral Daily  . QUEtiapine  25 mg Oral QHS  . rivastigmine  4.6 mg Transdermal Daily  . silver sulfADIAZINE  1 application Topical Daily  . sodium chloride flush  3 mL Intravenous Q12H  . vitamin B-12  100 mcg Oral Daily  . vitamin C  1,000 mg Oral Once per day on Mon Wed Fri   Continuous Infusions: . sodium chloride     PRN Meds:.sodium chloride, ondansetron (ZOFRAN) IV, sodium chloride flush, traMADol, traMADol   Antibiotics   Anti-infectives (From admission, onward)   Start     Dose/Rate Route Frequency Ordered Stop   02/14/18 1030  amoxicillin-clavulanate (AUGMENTIN) 875-125 MG per tablet 1 tablet    Note to Pharmacy:  For 10 days- started on 02-04-18.     1 tablet Oral 2 times daily 02/14/18 1015 02/14/18 2137        Subjective:   [name] [name] was seen and examined today. Patient denies dizziness, chest pain, shortness of breath, abdominal pain, N/V/D/C, new weakness, numbess, tingling. No acute events overnight.    Objective:   Vitals:   02/18/18 0510 02/18/18 0515 02/18/18 0625 02/18/18 0951  BP: (!) 163/71 (!) 158/89  (!) 142/63  Pulse: (!) 55   71  Resp: 18     Temp: 98.2 F (36.8 C)     TempSrc: Oral     SpO2: 100%   99%  Weight:   81 kg (178 lb 9.6 oz)   Height:        Intake/Output Summary (Last 24 hours) at 02/18/2018 1232 Last data filed at 02/18/2018 1002 Gross per 24 hour  Intake 963 ml  Output 2700 ml  Net -1737 ml     Wt Readings from Last 3 Encounters:  02/18/18 81 kg (178 lb 9.6 oz)  06/02/17 80.7 kg (178 lb)  05/13/17 83.9 kg (185 lb)     Exam  General: Alert and oriented x 3, NAD  Eyes:   HEENT:    Cardiovascular: S1 S2 auscultated, Regular rate and rhythm.  Respiratory: Clear to auscultation bilaterally, no wheezing, rales or rhonchi  Gastrointestinal: Soft, nontender, nondistended, + bowel sounds  Ext: no pedal edema bilaterally  Neuro:no  new deficit  Musculoskeletal: No digital cyanosis, clubbing  Skin: No  rashes  Psych: Normal affect and demeanor, alert and oriented x3    Data Reviewed:  I have personally reviewed following labs and imaging studies  Micro Results Recent Results (from the past 240 hour(s))  Culture, blood (routine x 2)     Status: None (Preliminary result)   Collection Time: 02/14/18  8:38 AM  Result Value Ref Range Status   Specimen Description BLOOD RIGHT FOREARM  Final  Special Requests   Final    BOTTLES DRAWN AEROBIC AND ANAEROBIC Blood Culture adequate volume   Culture   Final    NO GROWTH 3 DAYS Performed at [facility], [place] [address], [place], [place] [address]    Report Status PENDING  Incomplete  Urine culture     Status: None   Collection Time: 02/14/18  9:34 AM  Result Value Ref Range Status   Specimen Description URINE, CLEAN CATCH  Final   Special Requests NONE  Final   Culture   Final    NO GROWTH Performed at [facility], [address], [place], [place] [address]    Report Status 02/15/2018 FINAL  Final  Culture, blood (routine x 2)     Status: None (Preliminary result)   Collection Time: 02/14/18  7:29 PM  Result Value Ref Range Status   Specimen Description BLOOD RIGHT ANTECUBITAL  Final   Special Requests   Final    BOTTLES DRAWN AEROBIC ONLY Blood Culture adequate volume   Culture   Final    NO GROWTH 3 DAYS Performed at [facility], [place] [address], [place], [place] [address]    Report Status PENDING  Incomplete  MRSA PCR Screening     Status: Abnormal   Collection Time: 02/14/18 10:01 PM  Result Value Ref Range Status   MRSA by PCR POSITIVE (A) NEGATIVE Final    Comment:        The GeneXpert MRSA Assay (FDA approved for NASAL specimens only), is one component of a comprehensive MRSA colonization surveillance program. It is not intended to diagnose MRSA infection nor to guide or monitor treatment for MRSA infections. RESULT CALLED TO, READ BACK BY AND VERIFIED WITH: RASUL,G RN 02/14/18 AT 2348 SKEEN,P      Radiology Reports Dg Cervical Spine 2 Or 3 Views  Result Date: 02/14/2018 CLINICAL DATA:  Chronic pain. EXAM: CERVICAL SPINE - 2-3 VIEW COMPARISON:  None. FINDINGS: Lung apices are unremarkable. Calcifications in the soft tissues of the right neck are consistent with carotid calcifications. The cervical spine is only well seen through the C4 level on the lateral view. C5 and C6 are partially visualized but obscured by overlapping soft tissues. No obvious fracture malalignment within visualize limits. IMPRESSION: Limited study below the C4 level. No fractures or malalignment identified. There are uncovertebral and facet degenerative changes. Electronically Signed   By: Dorise Bullion III M.D   On: 02/14/2018 14:19   Dg Thoracic Spine 2 View  Result Date: 02/14/2018 CLINICAL DATA:  Pain from neck down.  The pain is chronic. EXAM: THORACIC SPINE 2 VIEWS COMPARISON:  None. FINDINGS: Multilevel degenerative changes. No fractures or malalignment identified. IMPRESSION: Multilevel degenerative changes. Electronically Signed   By: Dorise Bullion III M.D   On: 02/14/2018 14:15   Dg Lumbar Spine 2-3 Views  Result Date: 02/14/2018 CLINICAL DATA:  Chronic back pain. EXAM: LUMBAR SPINE - 2-3 VIEW COMPARISON:  None. FINDINGS: SI joint degenerative changes inferiorly. Pedicle rods and screws are seen at L3, L4, L5, and S1. Disc spacer devices are seen at L3-4 and L4-5. Hardware is in good position. Grade 1 anterolisthesis of L4 versus L5. Vacuum disc phenomena at L2-3. Lower lumbar facet degenerative changes. No other abnormalities or changes. IMPRESSION: Degenerative changes. Surgical hardware, stable. No acute abnormalities. Electronically Signed   By: Dorise Bullion III M.D   On: 02/14/2018 14:17   Dg Chest Port 1 View  Result Date: 02/14/2018  CLINICAL DATA:  Shortness of breath today. EXAM: PORTABLE CHEST 1 VIEW COMPARISON:  Chest x-rays dated 01/20/2017 and 12/11/2015. FINDINGS: Increased interstitial  prominence bilaterally indicating interstitial edema. More focal dense opacity at the RIGHT lung base, atelectasis versus pneumonia. No pleural effusion or pneumothorax seen. Stable cardiomegaly. Aortic atherosclerosis. No acute appearing osseous abnormality. IMPRESSION: 1. Cardiomegaly with bilateral interstitial prominence, presumably interstitial edema, most likely CHF. 2. Focal confluent opacity at the RIGHT lung base, atelectasis versus pneumonia. If afebrile, most likely atelectasis. Electronically Signed   By: Franki Cabot M.D.   On: 02/14/2018 07:52    Lab Data:  CBC: Recent Labs  Lab 02/14/18 0730 02/16/18 0554 02/16/18 1123 02/16/18 1354 02/17/18 0545  WBC 9.7 7.3  --   --  6.3  NEUTROABS 6.5  --   --   --   --   HGB 9.8* 8.7*  --  8.9* 9.0*  HCT 32.6* 27.9* 26.8* 28.9* 29.6*  MCV 113.6* 110.7*  --   --  113.8*  PLT 228 190  --   --  237   Basic Metabolic Panel: Recent Labs  Lab 02/14/18 0730 02/14/18 0804 02/15/18 0427 02/16/18 0554 02/17/18 0545  NA 139  --  139 137 139  K 4.4  --  4.2 3.8 4.3  CL 106  --  105 99* 107  CO2 25  --  30 29 27   GLUCOSE 173*  --  174* 164* 125*  BUN 20  --  22* 25* 31*  CREATININE 1.04*  --  1.20* 1.12* 1.23*  CALCIUM 9.1  --  8.6* 8.6* 8.4*  MG  --  1.8  --   --   --    GFR: Estimated Creatinine Clearance: 33.2 mL/min (A) (by C-G formula based on SCr of 1.23 mg/dL (H)). Liver Function Tests: Recent Labs  Lab 02/17/18 0545  AST 22  ALT 15  ALKPHOS 80  BILITOT 0.5  PROT 6.0*  ALBUMIN 2.8*   No results for input(s): LIPASE, AMYLASE in the last 168 hours. No results for input(s): AMMONIA in the last 168 hours. Coagulation Profile: No results for input(s): INR, PROTIME in the last 168 hours. Cardiac Enzymes: Recent Labs  Lab 02/14/18 1300 02/14/18 1929  TROPONINI 0.03* 0.04*   BNP (last 3 results) No results for input(s): PROBNP in the last 8760 hours. HbA1C: No results for input(s): HGBA1C in the last 72  hours. CBG: Recent Labs  Lab 02/17/18 1649 02/17/18 2125 02/18/18 0756 02/18/18 0823 02/18/18 1139  GLUCAP 119* 173* 58* 74 121*   Lipid Profile: No results for input(s): CHOL, HDL, LDLCALC, TRIG, CHOLHDL, LDLDIRECT in the last 72 hours. Thyroid Function Tests: No results for input(s): TSH, T4TOTAL, FREET4, T3FREE, THYROIDAB in the last 72 hours. Anemia Panel: Recent Labs    02/16/18 1123 02/17/18 0545  VITAMINB12 1,304*  --   FERRITIN  --  61  TIBC  --  283  IRON  --  35   Urine analysis:    Component Value Date/Time   COLORURINE STRAW (A) 02/14/2018 [place] 02/14/2018 0934   LABSPEC 1.006 02/14/2018 0934   PHURINE 6.0 02/14/2018 0934   GLUCOSEU NEGATIVE 02/14/2018 0934   HGBUR SMALL (A) 02/14/2018 0934   BILIRUBINUR NEGATIVE 02/14/2018 0934   KETONESUR NEGATIVE 02/14/2018 0934   PROTEINUR NEGATIVE 02/14/2018 0934   UROBILINOGEN 0.2 05/05/2015 1202   NITRITE NEGATIVE 02/14/2018 0934   LEUKOCYTESUR NEGATIVE 02/14/2018 0934     Jahaan Vanwagner M.D. [facility] 02/18/2018, 12:32 PM  Pager: [phone] Between 7am to 7pm - call Pager - [phone]  After 7pm go to [email or web address] - password [email or web address]  Call night coverage person covering after 7pm

## 2018-02-18 NOTE — Progress Notes (Addendum)
Physical Therapy Treatment Patient Details Name: [name] [name] MRN: [id number] DOB: 1934-01-10 Today's Date: 02/18/2018    History of Present Illness [name] [name] is a 84 y.o. female with medical history significant of atrial fibrillation on anticoagulation, diabetes type 2 with severe peripheral artery disease status post interventions to the left popliteal artery in July 2018, Lewy body dementia followed by neurology, coronary artery disease status post coronary artery bypass graft in 2012, hypothyroidism, Neri retention, vitamin D deficiency, vitamin B12 deficiency and diabetic retinopathy who presents the emergency department complaining of shortness of breath.  She was initially called EMS due to back pain.  EMS noted that she was short of breath her sats were 83% she was put on 100% nonrebreather.  She does not have any oxygen at home.     PT Comments    Patient progressing with therapy today, increasing ambulation distance by 120' without becoming dyspneic. Pt continues to require cueing for safety during transfers, however is requiring less physical assistance to stand. Upon conversation with CM, family eager to have patient return home after her hospitalization. Rec CIR consult to maximize safety and return to home.    Follow Up Recommendations  CIR;Supervision/Assistance - 24 hour     Equipment Recommendations  None recommended by PT    Recommendations for Other Services       Precautions / Restrictions Precautions Precautions: Fall Precaution Comments: tangental speech Restrictions Weight Bearing Restrictions: No    Mobility  Bed Mobility Overal bed mobility: Needs Assistance Bed Mobility: Supine to Sit     Supine to sit: Supervision        Transfers Overall transfer level: Needs assistance Equipment used: Rolling walker (2 wheeled) Transfers: Sit to/from Stand Sit to Stand: Min guard         General transfer comment: min guard for transfers today, max  cues for hand placement and RW use  Ambulation/Gait Ambulation/Gait assistance: Min guard Ambulation Distance (Feet): 180 Feet Assistive device: Rolling walker (2 wheeled) Gait Pattern/deviations: Step-through pattern;Decreased stride length;Trunk flexed Gait velocity: slow   General Gait Details: increased distance, no LOB, did not become SOB, satted well high 90s HR 76 after activity. RW and min guard for safety.   Stairs             Wheelchair Mobility    Modified Rankin (Stroke Patients Only)       Balance Overall balance assessment: Needs assistance Sitting-balance support: No upper extremity supported;Feet supported Sitting balance-Leahy Scale: Fair     Standing balance support: Bilateral upper extremity supported Standing balance-Leahy Scale: Poor Standing balance comment: dependent on RW                            Cognition Arousal/Alertness: Awake/alert Behavior During Therapy: WFL for tasks assessed/performed Overall Cognitive Status: No family/caregiver present to determine baseline cognitive functioning                                 General Comments: pt appears coherent and oriented however pt very distracted with tangental speech and impaired short term memory      Exercises      General Comments        Pertinent Vitals/Pain Pain Assessment: No/denies pain    Home Living  Prior Function            PT Goals (current goals can now be found in the care plan section) Acute Rehab PT Goals Patient Stated Goal: didn't state PT Goal Formulation: Patient unable to participate in goal setting Time For Goal Achievement: 03/01/18 Progress towards PT goals: Progressing toward goals    Frequency    Min 3X/week      PT Plan Current plan remains appropriate    Co-evaluation              AM-PAC PT "6 Clicks" Daily Activity  Outcome Measure  Difficulty turning over in bed  (including adjusting bedclothes, sheets and blankets)?: A Lot Difficulty moving from lying on back to sitting on the side of the bed? : A Lot Difficulty sitting down on and standing up from a chair with arms (e.g., wheelchair, bedside commode, etc,.)?: A Lot Help needed moving to and from a bed to chair (including a wheelchair)?: A Little Help needed walking in hospital room?: A Little Help needed climbing 3-5 steps with a railing? : A Lot 6 Click Score: 14    End of Session Equipment Utilized During Treatment: Gait belt Activity Tolerance: Patient tolerated treatment well Patient left: in chair;with chair alarm set;with call bell/phone within reach Nurse Communication: Mobility status PT Visit Diagnosis: Unsteadiness on feet (R26.81)     Time: 1000-1015 PT Time Calculation (min) (ACUTE ONLY): 15 min  Charges:  $Gait Training: 8-22 mins                    G Codes:      [name] [name], PT, DPT Acute Rehab Services Pager: [phone]     [name] [name] 02/18/2018, 10:12 AM

## 2018-02-18 NOTE — Clinical Social Work Note (Signed)
CSW facilitated patient discharge including contacting patient family and facility to confirm patient discharge plans. Clinical information faxed to facility and family agreeable with plan. CSW arranged ambulance transport via [facility] to [facility]. RN to call report prior to discharge [phone] Room 104B).  CSW will sign off for now as social work intervention is no longer needed. Please consult [place] again if new needs arise.  [place] Scrape, [place]

## 2018-02-18 NOTE — Progress Notes (Signed)
Noted referral for Inpt Rehab; CM following for progression of care; Inpt rehab vs home; [name] [name] [phone]

## 2018-02-18 NOTE — Consult Note (Signed)
Physical Medicine and Rehabilitation Consult Reason for Consult: Decreased functional mobility Referring Physician: [facility]   HPI: [name] [name] is a 84 y.o. right-handed female with history of atrial fibrillation maintained on Eliquis, carotid enterectomy, diabetes mellitus, peripheral vascular disease, CAD with CABG 2012, chronic back pain, Lewy body dementia followed by neurology services.  Per chart review patient lives alone but has a home health aide from 10 to 3 PM Monday through Friday.  One level home 5 steps to entry.  She has a daughter in the area that works.  Patient uses a rolling walker prior to admission.  Presented 02/14/2018 with increasing shortness of breath.  Oxygen saturations 83% per EMS was placed on nonrebreather mask.  Chest x-ray showed cardiomegaly with interstitial edema most likely reflecting CHF.  Focal confluent opacity at the right lung base.  Echocardiogram with ejection fraction of 65%.  X-rays lumbar thoracic cervical spine without fracture.  Patient placed on intravenous Lasix.  MRSA PCR screening positive placed on contact precautions.  Palliative care consulted to establish goals of care.  Physical therapy evaluation completed with recommendations of physical medicine rehab consult.  Patient states that she received a call from her daughter and her part-time caregiver is moving in with her to provide 24/7 care/supervision Review of Systems  Constitutional: Negative for chills and fever.  HENT: Negative for hearing loss.   Eyes: Negative for blurred vision and double vision.  Respiratory: Positive for cough and shortness of breath.   Cardiovascular: Positive for palpitations.  Gastrointestinal: Positive for constipation. Negative for nausea.       GERD  Genitourinary: Negative for hematuria.       Stress incontinence  Musculoskeletal: Positive for back pain.  Skin: Negative for rash.  Neurological: Positive for headaches.  Psychiatric/Behavioral:  Positive for depression and memory loss. The patient has insomnia.   All other systems reviewed and are negative.  Past Medical History:  Diagnosis Date  . Anemia   . Anxiety   . Arthritis    "right wrist, lower back, maybe my right shoulder" (04/08/2017)  . Atrial fibrillation ([place])   . Bilateral carotid artery stenosis   . Bowel incontinence   . CAD (coronary artery disease)    a. s/p CABG 2012.  [name] Chronic lower back pain    "back is very stiff because of fusion; can't stand up straight more than few moments"  . Closed fracture of unspecified part of neck of femur   . Complication of anesthesia    anesth. meds caused hallucinations   . Cough    no fever, chronic  . Degenerative joint disease   . Dementia   . Depression   . Diverticulitis   . Endometriosis   . Fall at home 05/15/2013  . GERD (gastroesophageal reflux disease)   . Headache   . Hiatal hernia   . History of blood transfusion   . History of wheezing   . HLD (hyperlipidemia)   . Hx of cardiovascular stress test    Lexiscan Myoview (11/15):  Low risk stress nuclear study with a small, mild fixed apical septal perfusion defect. EF is 52% with mild septal hypokinesis. No ischemia.  [name] Hx of echocardiogram    Echo (11/15):  Mod LVH, severe septal hypertrophy (suggestive HCM), no LVOT obstruction, rest gradient 15 mmHg across AV, vigorous LVF, EF 65-70%, no RWMA, Gr 1 DD, Ao sclerosis (no stenosis), mod LAE, mild RAE, mod TR, PASP 42 mmHg  . Hypertension   .  Hypertonicity of bladder   . Hypertrophic cardiomyopathy ([place])   . Hypothyroidism   . Lumbago   . Muscle weakness (generalized)   . Obesity   . Osteoarthrosis, unspecified whether generalized or localized, unspecified site   . Progressive dementia with uncertain etiology 10/12/7865   Suspect Lewy body .  [name] Reflux esophagitis   . Stroke [facility]) 2011   "old very small TIA/neurologist"  . Type II diabetes mellitus ([place])   . Ulcer    left foot x 2 years sees dr  Little Ishikawa podiastrist for wound staying same tiny wart appearing areas no drainage changes dressing every 3 days  . Unspecified glaucoma(365.9)   . Urinary incontinence    Past Surgical History:  Procedure Laterality Date  . ABDOMINAL AORTOGRAM W/LOWER EXTREMITY N/A 04/08/2017   Procedure: Abdominal Aortogram w/Lower Extremity;  Surgeon: Waynetta Sandy, MD;  Location: [place] [facility];  Service: Cardiovascular;  Laterality: N/A;  . BACK SURGERY     X2 lower  . BOTOX INJECTION N/A 12/02/2016   Procedure: BOTOX INJECTION;  Surgeon: Bjorn Loser, MD;  Location: [facility];  Service: Urology;  Laterality: N/A;  . CARDIAC CATHETERIZATION    . CARPAL TUNNEL RELEASE Bilateral   . CATARACT EXTRACTION W/ INTRAOCULAR LENS  IMPLANT, BILATERAL Bilateral   . CHOLECYSTECTOMY    . CORONARY ARTERY BYPASS GRAFT  04/17/2011   LT  internal mammary artery to left anterior descending, saphenous vein graft first diagonal, saphenous  vein graft to obtuse marginal 1, saphenous vein graft to posterior descending  . CYSTOSCOPY N/A 12/02/2016   Procedure: CYSTOSCOPY;  Surgeon: Bjorn Loser, MD;  Location: [facility];  Service: Urology;  Laterality: N/A;  . DILATION AND CURETTAGE OF UTERUS     "for endometrosis"  . DILATION AND CURETTAGE OF UTERUS     ENDOMETRIOSIS  . ENDARTERECTOMY Left 10/01/2015   Procedure: Left CAROTID Endartarectomy;  Surgeon: Rosetta Posner, MD;  Location: [place];  Service: Vascular;  Laterality: Left;  . EYE SURGERY    . HAND SURGERY Right    "for arthritis;"  . JOINT REPLACEMENT    . LASER PHOTO ABLATION Right 01/20/2013   Procedure: LASER PHOTO ABLATION;  Surgeon: Hayden Pedro, MD;  Location: [place];  Service: Ophthalmology;  Laterality: Right;  . LOWER EXTREMITY ANGIOGRAM N/A 03/10/2012   Procedure: LOWER EXTREMITY ANGIOGRAM;  Surgeon: Jettie Booze, MD;  Location: [facility] CATH [facility];  Service: Cardiovascular;  Laterality: N/A;  . NEUROPLASTY / TRANSPOSITION MEDIAN NERVE AT CARPAL  TUNNEL BILATERAL    . PARTIAL HIP ARTHROPLASTY Left 11/2009   "just the ball"  . PERIPHERAL VASCULAR CATHETERIZATION N/A 05/16/2015   Procedure: Abdominal Aortogram;  Surgeon: Elam Dutch, MD;  Location: [place] [facility];  Service: Cardiovascular;  Laterality: N/A;  . PERIPHERAL VASCULAR CATHETERIZATION Left 05/16/2015   Procedure: Lower Extremity Angiography;  Surgeon: Elam Dutch, MD;  Location: [place] [facility];  Service: Cardiovascular;  Laterality: Left;  . PERIPHERAL VASCULAR INTERVENTION  04/08/2017   Procedure: Peripheral Vascular Intervention;  Surgeon: Waynetta Sandy, MD;  Location: [place] [facility];  Service: Cardiovascular;;  lt.Popliteal  . POSTERIOR FUSION LUMBAR SPINE     "removed L4-5, S1"  . PTCA  03/10/12   LLE  . RETINAL DETACHMENT SURGERY Right May 2014  . SCLERAL BUCKLE Right 01/20/2013   Procedure: SCLERAL BUCKLE;  Surgeon: Hayden Pedro, MD;  Location: [place];  Service: Ophthalmology;  Laterality: Right;  . TOTAL KNEE ARTHROPLASTY  ~  2004   right  . TOTAL SHOULDER ARTHROPLASTY Right 10/07/2013   Procedure: TOTAL SHOULDER ARTHROPLASTY;  Surgeon: Alta Corning, MD;  Location: [place];  Service: Orthopedics;  Laterality: Right;   Family History  Problem Relation Age of Onset  . Cancer Cousin   . Heart attack Mother   . Hypertension Mother   . Heart attack Father   . Heart disease Father        Before age 28  . Hyperlipidemia Father   . Hypertension Father   . Cancer Paternal Aunt    Social History:  reports that she quit smoking about 30 years ago. Her smoking use included cigarettes. She has a 57.00 pack-year smoking history. She has never used smokeless tobacco. She reports that she does not drink alcohol or use drugs. Allergies:  Allergies  Allergen Reactions  . Amitriptyline Other (See Comments)    hallucinations  . Vesicare [Solifenacin] Other (See Comments)    hallucinations  . Vicodin [Hydrocodone-Acetaminophen] Other (See  Comments)    Hallucinations - do NOT verify Vicodin orders  . Polysaccharide Iron Complex Hives, Itching and Rash    Patient can tolerate Ferrous Sulfate   Medications Prior to Admission  Medication Sig Dispense Refill  . acetaminophen (TYLENOL) 650 MG CR tablet Take 650 mg by mouth every 8 (eight) hours as needed for pain.    [name] amoxicillin-clavulanate (AUGMENTIN) 875-125 MG tablet Take 1 tablet by mouth 2 (two) times daily. For 10 days- started on 02-04-18.  0  . apixaban (ELIQUIS) 5 MG TABS tablet Take 1 tablet (5 mg total) by mouth 2 (two) times daily. 60 tablet 11  . Ascorbic Acid (VITAMIN C) 1000 MG tablet Take 1,000 mg by mouth 3 (three) times a week.     [name] aspirin EC 81 MG EC tablet Take 1 tablet (81 mg total) by mouth daily.    . cholecalciferol (VITAMIN D) 1000 UNITS tablet Take 1,000 Units by mouth See admin instructions. On Monday, Tuesday, Wednesday, Friday, Saturday.    . COMBIGAN 0.2-0.5 % ophthalmic solution Place 1 drop into both eyes 2 (two) times daily.   0  . felodipine (PLENDIL) 10 MG 24 hr tablet Take 1 tablet (10 mg total) by mouth daily. 30 tablet 6  . ferrous sulfate 325 (65 FE) MG tablet Take 325 mg by mouth daily with breakfast.    . FLUoxetine (PROZAC) 20 MG capsule Take 20 mg by mouth every morning.     [name] glipiZIDE (GLUCOTROL XL) 5 MG 24 hr tablet Take 5 mg by mouth at bedtime.     . Insulin Glargine (LANTUS SOLOSTAR) 100 UNIT/ML SOPN Inject 10 Units into the skin at bedtime.     [name] levothyroxine (SYNTHROID, LEVOTHROID) 125 MCG tablet Take 125 mcg by mouth daily before breakfast.     . losartan (COZAAR) 25 MG tablet Take 25 mg by mouth daily.   0  . memantine (NAMENDA) 10 MG tablet Take 1 tablet (10 mg total) by mouth 2 (two) times daily. 180 tablet 3  . metoprolol tartrate (LOPRESSOR) 25 MG tablet Take 25 mg by mouth 2 (two) times daily.      . mirabegron ER (MYRBETRIQ) 50 MG TB24 Take 50 mg by mouth every evening.    . pioglitazone (ACTOS) 30 MG tablet Take 30 mg  by mouth every morning.     . pravastatin (PRAVACHOL) 80 MG tablet Take 1 tablet (80 mg total) by mouth daily. 90 tablet 2  . QUEtiapine (SEROQUEL)  25 MG tablet TAKE 1 TABLET BY MOUTH AT BEDTIME 30 tablet 9  . rivastigmine (EXELON) 4.6 mg/24hr Place 1 patch (4.6 mg total) onto the skin daily. 30 patch 12  . silver sulfADIAZINE (SILVADENE) 1 % cream Apply 1 application topically daily. 50 g 0  . [DISCONTINUED] Cyanocobalamin (VITAMIN B 12 PO) Take 1 tablet by mouth daily.    . ergocalciferol (VITAMIN D2) 50000 UNITS capsule Take 50,000 Units by mouth 2 (two) times a week. On Thursday and Sunday      Home: Home Living Family/patient expects to be discharged to:: Private residence Living Arrangements: Alone Available Help at Discharge: Family, Personal care attendant, Available PRN/intermittently Type of Home: House Home Access: Stairs to enter [employer] of Steps: 5 Entrance Stairs-Rails: Right, Left, Can reach both Home Layout: One level Bathroom Shower/Tub: Multimedia programmer: Handicapped height Home Equipment: Environmental consultant - 2 wheels, Shower seat, Grab bars - tub/shower, Grab bars - toilet Additional Comments: has HHAIDE 10-3pm, M-F  Functional History: Prior Function Level of Independence: Needs assistance Gait / Transfers Assistance Needed: uses RW ADL's / Homemaking Assistance Needed: caregiver present for dressing/bathing however pt reports she can do it on her own Comments: aide makes her breakfast Functional Status:  Mobility: Bed Mobility Overal bed mobility: Needs Assistance Bed Mobility: Supine to Sit Supine to sit: Supervision General bed mobility comments: max directional v/c's, modA to bring hips to EOB, minA for trunk elevation, bed rail used, HOB elevated Transfers Overall transfer level: Needs assistance Equipment used: Rolling walker (2 wheeled) Transfers: Sit to/from Stand Sit to Stand: Min guard General transfer comment: min guard for  transfers today, max cues for hand placement and RW use Ambulation/Gait Ambulation/Gait assistance: Min guard Ambulation Distance (Feet): 180 Feet Assistive device: Rolling walker (2 wheeled) Gait Pattern/deviations: Step-through pattern, Decreased stride length, Trunk flexed General Gait Details: increased distance, no LOB, did not become SOB, satted well high 90s HR 76 after activity. RW and min guard for safety. Gait velocity: slow Gait velocity interpretation: <1.8 ft/sec, indicate of risk for recurrent falls    ADL:    Cognition: Cognition Overall Cognitive Status: No family/caregiver present to determine baseline cognitive functioning Orientation Level: Oriented X4 Cognition Arousal/Alertness: Awake/alert Behavior During Therapy: WFL for tasks assessed/performed Overall Cognitive Status: No family/caregiver present to determine baseline cognitive functioning General Comments: pt appears coherent and oriented however pt very distracted with tangental speech and impaired short term memory  Blood pressure (!) 142/63, pulse 71, temperature 98.2 F (36.8 C), temperature source Oral, resp. rate 18, height 5' (1.524 m), weight 81 kg (178 lb 9.6 oz), SpO2 99 %. Physical Exam  Vitals reviewed. HENT:  Head: Normocephalic.  Eyes: EOM are normal.  Neck: Normal range of motion. Neck supple. No thyromegaly present.  Cardiovascular:  Cardiac rate controlled  Respiratory: Effort normal and breath sounds normal. No respiratory distress.  GI: Soft. Bowel sounds are normal. She exhibits no distension.  Neurological: She is alert. Gait abnormal.  Patient provides her name, age and date of birth.  Motor strength is 5/5 bilateral deltoid by stress and grip hip flexor knee extensor ankle dorsiflexor.  Sit to stand is with supervision she has wide-based support hesitant to let go of armrests.  Skin: Skin is warm and dry.  Psychiatric:  Patient is alert and oriented x3 however she does  repeat the same information within a couple minutes.    Results for orders placed or performed during the hospital encounter of 02/14/18 (from the  past 24 hour(s))  Glucose, capillary     Status: Abnormal   Collection Time: 02/17/18 12:27 PM  Result Value Ref Range   Glucose-Capillary 146 (H) 65 - 99 mg/dL  Glucose, capillary     Status: Abnormal   Collection Time: 02/17/18  4:49 PM  Result Value Ref Range   Glucose-Capillary 119 (H) 65 - 99 mg/dL  Glucose, capillary     Status: Abnormal   Collection Time: 02/17/18  9:25 PM  Result Value Ref Range   Glucose-Capillary 173 (H) 65 - 99 mg/dL  Glucose, capillary     Status: Abnormal   Collection Time: 02/18/18  7:56 AM  Result Value Ref Range   Glucose-Capillary 58 (L) 65 - 99 mg/dL  Glucose, capillary     Status: None   Collection Time: 02/18/18  8:23 AM  Result Value Ref Range   Glucose-Capillary 74 65 - 99 mg/dL   No results found.     Assessment/Plan: Diagnosis: Debility secondary to exacerbation of diastolic CHF. 1. Does the need for close, 24 hr/day medical supervision in concert with the patient's rehab needs make it unreasonable for this patient to be served in a less intensive setting? No 2. Co-Morbidities requiring supervision/potential complications: Coronary artery disease, hypertension, diabetes type 2 with peripheral vascular disease, Lewy body dementia, atrial fibrillation 3. Due to bladder management, bowel management, safety, skin/wound care and disease management, does the patient require 24 hr/day rehab nursing? Potentially 4. Does the patient require coordinated care of a physician, rehab nurse, PT, OT to address physical and functional deficits in the context of the above medical diagnosis(es)? No Addressing deficits in the following areas: balance, endurance, locomotion, strength, transferring, bowel/bladder control, bathing, dressing and toileting 5. Can the patient actively participate in an intensive therapy  program of at least 3 hrs of therapy per day at least 5 days per week? Potentially 6. The potential for patient to make measurable gains while on inpatient rehab is Limited secondary to being close to functional baseline 7. Anticipated functional outcomes upon discharge from inpatient rehab are n/a  with PT, n/a with OT, n/a with SLP. 8. Estimated rehab length of stay to reach the above functional goals is: Not applicable 9. Anticipated D/C setting: Home 10. Anticipated post D/C treatments: [place] therapy 11. Overall Rehab/Functional Prognosis: good  RECOMMENDATIONS: This patient's condition is appropriate for continued rehabilitative care in the following setting: [facility] Therapy Patient has agreed to participate in recommended program. Yes Note that insurance prior authorization may be required for reimbursement for recommended care.  Comment: Patient now has reported 24/7 caregiver.  Patient is close to her premorbid functional status. "I have personally performed a face to face diagnostic evaluation of this patient.  Additionally, I have reviewed and concur with the physician assistant's documentation above." Charlett Blake M.D. [facility] FAAPM&R (Sports Med, Neuromuscular Med) Diplomate Am Board of Electrodiagnostic Med  Cathlyn Parsons, PA-C 02/18/2018

## 2018-02-18 NOTE — Progress Notes (Signed)
Inpatient Diabetes Program Recommendations  AACE/ADA: New Consensus Statement on Inpatient Glycemic Control (2015)  Target Ranges:  Prepandial:   less than 140 mg/dL      Peak postprandial:   less than 180 mg/dL (1-2 hours)      Critically ill patients:  140 - 180 mg/dL   Results for [name] [name], [name] [name] (MRN [id number]) as of 02/18/2018 10:34  Ref. Range 02/17/2018 07:39 02/17/2018 12:27 02/17/2018 16:49 02/17/2018 21:25 02/18/2018 07:56 02/18/2018 08:23  Glucose-Capillary Latest Ref Range: 65 - 99 mg/dL 145 (H) 146 (H) 119 (H) 173 (H) 58 (L) 74   Review of Glycemic Control  Diabetes history: DM2 Outpatient Diabetes medications:  Current orders for Inpatient glycemic control: Lantus 10 units QHS, Novolog 0-15 units TID with meals, Glipizide XL 5 mg QHS  Inpatient Diabetes Program Recommendations: Oral Agents: Please consider discontinuing Glipizide while inpatient.  Thanks, Barnie Alderman, RN, MSN, CDE Diabetes Coordinator Inpatient Diabetes Program [phone] (Team Pager from 8am to 5pm)

## 2018-02-18 NOTE — Clinical Social Work Placement (Signed)
   CLINICAL SOCIAL WORK PLACEMENT  NOTE  Date:  02/18/2018  Patient Details  Name: [name] [name] MRN: [id number] Date of Birth: [birth year]  Clinical Social Work is seeking post-discharge placement for this patient at the [place] level of care (*CSW will initial, date and re-position this form in  chart as items are completed):  Yes   Patient/family provided with [facility] Work Department's list of facilities offering this level of care within the geographic area requested by the patient (or if unable, by the patient's family).  Yes   Patient/family informed of their freedom to choose among providers that offer the needed level of care, that participate in Medicare, Medicaid or managed care program needed by the patient, have an available bed and are willing to accept the patient.  Yes   Patient/family informed of [facility]'s ownership interest in [facility] and [facility], as well as of the fact that they are under no obligation to receive care at these facilities.  PASRR submitted to EDS on 02/17/18     PASRR number received on       Existing PASRR number confirmed on 02/17/18     FL2 transmitted to all facilities in geographic area requested by pt/family on 02/17/18     FL2 transmitted to all facilities within larger geographic area on       Patient informed that his/her managed care company has contracts with or will negotiate with certain facilities, including the following:        Yes   Patient/family informed of bed offers received.  Patient chooses bed at [facility] [place]     Physician recommends and patient chooses bed at      Patient to be transferred to [facility], [place] on 02/18/18.  Patient to be transferred to facility by [facility]     Patient family notified on 02/18/18 of transfer.  Name of family member notified:  [name]     PHYSICIAN       Additional Comment:     _______________________________________________ Candie Chroman, LCSW 02/18/2018, 5:15 PM

## 2018-02-18 NOTE — Clinical Social Work Note (Addendum)
[facility] is full today. [facility] is currently full but waiting on social worker to arrive to determine if there are any discharges today.  Dayton Scrape, CSW [phone]  9:52 am [facility] is full as well. CSW left voicemail for patient's daughter. Only other bed offers are [facility] and [facility].  Dayton Scrape, [place] [phone]  10:04 am CSW provided patient with update.  Dayton Scrape, [place] [phone]  11:53 am Noted PT now recommending CIR. CSW received voicemail from patient's daughter stating that [facility] would be acceptable. [facility] declined. [facility] admissions coordinator is reviewing referral.  Dayton Scrape, CSW [phone]  1:40 pm [facility] is able to offer a bed. Discussed CIR vs. SNF with patient's daughter. She is unsure if patient will be able to handle 3-4 hours of therapy per day. She is agreeable to pursuing [facility]. They will start insurance authorization.  Dayton Scrape, CSW [phone]  4:36 pm Patient has insurance approval to discharge to [facility] today. CSW paged MD to notify.  Dayton Scrape, [place]

## 2018-02-18 NOTE — Discharge Summary (Addendum)
Physician Discharge Summary   Patient ID: [name] [name] MRN: [id number] DOB/AGE: 01-30-1934 84 y.o.  Admit date: 02/14/2018 Discharge date: 02/18/2018  Primary Care Physician:  Lajean Manes, MD   Recommendations for Outpatient Follow-up:  1. Follow up with PCP in 1-2 weeks 2. Please obtain BMP in one week 3. Palliative care to follow at skilled [employer]: None  Equipment/Devices:   Discharge Condition: stable  CODE STATUS: DNR   Diet recommendation: Carb modified diet, low-salt   Discharge Diagnoses:    . Acute diastolic CHF (congestive heart failure), NYHA class 3 (HCC) Acute hypoxic respiratory failure . Hypertension . DM (diabetes mellitus), type 2 with peripheral vascular complications ([place]) . CAD (coronary artery disease) . Chronic atrial fibrillation . Pulmonary hypertension . Obesity (BMI 30-39.9) . Lewy body dementia with behavioral disturbance . Hyperlipemia . Bilateral carotid artery stenosis status post carotid endarterectomy January 2017 . Gastroparesis due to DM ([place]) . Hypothyroidism . Urinary retention . Vitamin D deficiency . Vitamin B 12 deficiency . Pulmonary hypertension ([place])   Consults: Cardiology    Allergies:   Allergies  Allergen Reactions  . Amitriptyline Other (See Comments)    hallucinations  . Vesicare [Solifenacin] Other (See Comments)    hallucinations  . Vicodin [Hydrocodone-Acetaminophen] Other (See Comments)    Hallucinations - do NOT verify Vicodin orders  . Polysaccharide Iron Complex Hives, Itching and Rash    Patient can tolerate Ferrous Sulfate     DISCHARGE MEDICATIONS: Allergies as of 02/18/2018      Reactions   Amitriptyline Other (See Comments)   hallucinations   Vesicare [solifenacin] Other (See Comments)   hallucinations   Vicodin [hydrocodone-acetaminophen] Other (See Comments)   Hallucinations - do NOT verify Vicodin orders   Polysaccharide Iron Complex Hives, Itching, Rash   Patient can  tolerate Ferrous Sulfate      Medication List    STOP taking these medications   amoxicillin-clavulanate 875-125 MG tablet Commonly known as:  AUGMENTIN   aspirin 81 MG EC tablet   glipiZIDE 5 MG 24 hr tablet Commonly known as:  GLUCOTROL XL   pioglitazone 30 MG tablet Commonly known as:  ACTOS     TAKE these medications   acetaminophen 650 MG CR tablet Commonly known as:  TYLENOL Take 650 mg by mouth every 8 (eight) hours as needed for pain.   apixaban 5 MG Tabs tablet Commonly known as:  ELIQUIS Take 1 tablet (5 mg total) by mouth 2 (two) times daily.   cholecalciferol 1000 units tablet Commonly known as:  VITAMIN D Take 1,000 Units by mouth See admin instructions. On Monday, Tuesday, Wednesday, Friday, Saturday.   COMBIGAN 0.2-0.5 % ophthalmic solution Generic drug:  brimonidine-timolol Place 1 drop into both eyes 2 (two) times daily.   ergocalciferol 50000 units capsule Commonly known as:  VITAMIN D2 Take 50,000 Units by mouth 2 (two) times a week. On Thursday and Sunday   felodipine 10 MG 24 hr tablet Commonly known as:  PLENDIL Take 1 tablet (10 mg total) by mouth daily.   ferrous sulfate 325 (65 FE) MG tablet Take 325 mg by mouth daily with breakfast.   FLUoxetine 20 MG capsule Commonly known as:  PROZAC Take 20 mg by mouth every morning.   furosemide 40 MG tablet Commonly known as:  LASIX Take 1 tablet (40 mg total) by mouth daily.   insulin aspart 100 UNIT/ML injection Commonly known as:  novoLOG Inject 0-9 Units into the skin 3 (three) times daily  with meals. Sliding scale CBG 70 - 120: 0 units CBG 121 - 150: 1 unit,  CBG 151 - 200: 2 units,  CBG 201 - 250: 3 units,  CBG 251 - 300: 5 units,  CBG 301 - 350: 7 units,  CBG 351 - 400: 9 units   CBG > 400: 9 units and notify your MD   LANTUS SOLOSTAR 100 UNIT/ML Solostar Pen Generic drug:  Insulin Glargine Inject 10 Units into the skin at bedtime.   levothyroxine 125 MCG tablet Commonly known as:   SYNTHROID, LEVOTHROID Take 125 mcg by mouth daily before breakfast.   losartan 25 MG tablet Commonly known as:  COZAAR Take 25 mg by mouth daily.   memantine 10 MG tablet Commonly known as:  NAMENDA Take 1 tablet (10 mg total) by mouth 2 (two) times daily.   metoprolol tartrate 25 MG tablet Commonly known as:  LOPRESSOR Take 25 mg by mouth 2 (two) times daily.   mirabegron ER 50 MG Tb24 tablet Commonly known as:  MYRBETRIQ Take 50 mg by mouth every evening.   potassium chloride SA 20 MEQ tablet Commonly known as:  K-DUR,KLOR-CON Take 1 tablet (20 mEq total) by mouth daily.   pravastatin 80 MG tablet Commonly known as:  PRAVACHOL Take 1 tablet (80 mg total) by mouth daily.   QUEtiapine 25 MG tablet Commonly known as:  SEROQUEL TAKE 1 TABLET BY MOUTH AT BEDTIME   rivastigmine 4.6 mg/24hr Commonly known as:  EXELON Place 1 patch (4.6 mg total) onto the skin daily.   silver sulfADIAZINE 1 % cream Commonly known as:  SILVADENE Apply 1 application topically daily.   traMADol 50 MG tablet Commonly known as:  ULTRAM Take 1-2 tablets (50-100 mg total) by mouth every 6 (six) hours as needed for moderate pain or severe pain.   Vitamin B 12 100 MCG Lozg Take 1 tablet by mouth daily. What changed:  medication strength   vitamin C 1000 MG tablet Take 1,000 mg by mouth 3 (three) times a week.        Brief H and P: For complete details please refer to admission H and P, but in brief [name] [name] is an 84 y.o. female with medical history significant ofatrial fibrillation on anticoagulation, diabetes type 2 with severe peripheral artery disease status post interventions to the left popliteal artery in July 2018, Lewy body dementia followed by neurology, coronary artery disease status post coronary artery bypass graft in 2012, hypothyroidism, vitamin D deficiency, vitamin B12 deficiency and diabetic retinopathy who presents the emergency department complaining of shortness of  breath.  She was started on BiPAP therapy and had significant improvement in addition to being giving 60 mg of IV Lasix   Hospital Course:  Acute hypoxic respiratory failure with acute diastolic CHF patient presented with shortness of breath, -Patient presented with shortness of breath and hypoxia, was placed briefly on BiPAP.  She was also given IV Lasix 60 mg for diuresis with significant improvement -Patient was not on any Lasix at home.  2D echo showed right ventricular systolic pressure increased consistent with moderate pulmonary hypertension. -Patient did well with Lasix diuresis, negative balance of 7.8 L.  Weight down from 181 on admission-> 178lbs -Transition to oral Lasix 40 mg daily, follow BMET -O2 sats improved, currently 99% on room air  Chronic atrial fibrillation -Rate controlled, continue Eliquis for anticoagulation   Pulmonary hypertension: Moderate per 2D echo, history of CAD, CABG in 2012 -Continue diuresis, transition to oral  Lasix 40 mg daily, follow BMET - outpatient follow-up with cardiology  Diabetes mellitus type 2, uncontrolled with insulin-dependent -Actos and glipizide discontinued, continue sliding scale insulin, Lantus   Hyperlipidemia: Continue pravastatin  Obesity, BMI 30-39 -BMI 33.9, continue diet and weight control   Vitamin D deficiency, vitamin B12 deficiency: Continue supplements  Macrocytic anemia -B12 folate currently normal limits, patient is on B12 supplement  Day of Discharge S: No new complaints, shortness of breath is improving  BP (!) 116/56 (BP Location: Left Arm)   Pulse 67   Temp 99 F (37.2 C) (Oral)   Resp 18   Ht 5' (1.524 m)   Wt 81 kg (178 lb 9.6 oz)   SpO2 97%   BMI 34.88 kg/m   Physical Exam: General: Alert and awake oriented x3 not in any acute distress. HEENT: anicteric sclera, pupils reactive to light and accommodation CVS: S1-S2 clear no murmur rubs or gallops Chest: decreased breath sound at the  base Abdomen: soft nontender, nondistended, normal bowel sounds Extremities: no cyanosis, clubbing or edema noted bilaterally Neuro: no new deficits   The results of significant diagnostics from this hospitalization (including imaging, microbiology, ancillary and laboratory) are listed below for reference.      Procedures/Studies: 2D echo Study Conclusions  - Left ventricle: The cavity size was normal. There was moderate   focal basal and mild concentric hypertrophy. Systolic function   was normal. The estimated ejection fraction was in the range of   60% to 65%. There is hypokinesis of the apical anterior and   apical septal myocardium. The study was not technically   sufficient to allow evaluation of LV diastolic dysfunction due to   atrial fibrillation. - Mitral valve: Calcified annulus. Mildly thickened leaflets .   There was mild regurgitation. - Left atrium: The atrium was severely dilated. - Right atrium: The atrium was mildly dilated. - Tricuspid valve: There was moderate regurgitation. - Pulmonic valve: There was mild regurgitation. - Pulmonary arteries: PA peak pressure: 55 mm Hg (S).  Impressions:  - The right ventricular systolic pressure was increased consistent   with moderate pulmonary hypertension.    Dg Cervical Spine 2 Or 3 Views  Result Date: 02/14/2018 CLINICAL DATA:  Chronic pain. EXAM: CERVICAL SPINE - 2-3 VIEW COMPARISON:  None. FINDINGS: Lung apices are unremarkable. Calcifications in the soft tissues of the right neck are consistent with carotid calcifications. The cervical spine is only well seen through the C4 level on the lateral view. C5 and C6 are partially visualized but obscured by overlapping soft tissues. No obvious fracture malalignment within visualize limits. IMPRESSION: Limited study below the C4 level. No fractures or malalignment identified. There are uncovertebral and facet degenerative changes. Electronically Signed   By: Dorise Bullion III M.D   On: 02/14/2018 14:19   Dg Thoracic Spine 2 View  Result Date: 02/14/2018 CLINICAL DATA:  Pain from neck down.  The pain is chronic. EXAM: THORACIC SPINE 2 VIEWS COMPARISON:  None. FINDINGS: Multilevel degenerative changes. No fractures or malalignment identified. IMPRESSION: Multilevel degenerative changes. Electronically Signed   By: Dorise Bullion III M.D   On: 02/14/2018 14:15   Dg Lumbar Spine 2-3 Views  Result Date: 02/14/2018 CLINICAL DATA:  Chronic back pain. EXAM: LUMBAR SPINE - 2-3 VIEW COMPARISON:  None. FINDINGS: SI joint degenerative changes inferiorly. Pedicle rods and screws are seen at L3, L4, L5, and S1. Disc spacer devices are seen at L3-4 and L4-5. Hardware is in good position. Grade 1  anterolisthesis of L4 versus L5. Vacuum disc phenomena at L2-3. Lower lumbar facet degenerative changes. No other abnormalities or changes. IMPRESSION: Degenerative changes. Surgical hardware, stable. No acute abnormalities. Electronically Signed   By: Dorise Bullion III M.D   On: 02/14/2018 14:17   Dg Chest Port 1 View  Result Date: 02/14/2018 CLINICAL DATA:  Shortness of breath today. EXAM: PORTABLE CHEST 1 VIEW COMPARISON:  Chest x-rays dated 01/20/2017 and 12/11/2015. FINDINGS: Increased interstitial prominence bilaterally indicating interstitial edema. More focal dense opacity at the RIGHT lung base, atelectasis versus pneumonia. No pleural effusion or pneumothorax seen. Stable cardiomegaly. Aortic atherosclerosis. No acute appearing osseous abnormality. IMPRESSION: 1. Cardiomegaly with bilateral interstitial prominence, presumably interstitial edema, most likely CHF. 2. Focal confluent opacity at the RIGHT lung base, atelectasis versus pneumonia. If afebrile, most likely atelectasis. Electronically Signed   By: Franki Cabot M.D.   On: 02/14/2018 07:52      LAB RESULTS: Basic Metabolic Panel: Recent Labs  Lab 02/14/18 0804  02/16/18 0554 02/17/18 0545  NA  --    < > 137  139  K  --    < > 3.8 4.3  CL  --    < > 99* 107  CO2  --    < > 29 27  GLUCOSE  --    < > 164* 125*  BUN  --    < > 25* 31*  CREATININE  --    < > 1.12* 1.23*  CALCIUM  --    < > 8.6* 8.4*  MG 1.8  --   --   --    < > = values in this interval not displayed.   Liver Function Tests: Recent Labs  Lab 02/17/18 0545  AST 22  ALT 15  ALKPHOS 80  BILITOT 0.5  PROT 6.0*  ALBUMIN 2.8*   No results for input(s): LIPASE, AMYLASE in the last 168 hours. No results for input(s): AMMONIA in the last 168 hours. CBC: Recent Labs  Lab 02/14/18 0730 02/16/18 0554  02/16/18 1354 02/17/18 0545  WBC 9.7 7.3  --   --  6.3  NEUTROABS 6.5  --   --   --   --   HGB 9.8* 8.7*  --  8.9* 9.0*  HCT 32.6* 27.9*   < > 28.9* 29.6*  MCV 113.6* 110.7*  --   --  113.8*  PLT 228 190  --   --  202   < > = values in this interval not displayed.   Cardiac Enzymes: Recent Labs  Lab 02/14/18 1300 02/14/18 1929  TROPONINI 0.03* 0.04*   BNP: Invalid input(s): POCBNP CBG: Recent Labs  Lab 02/18/18 1139 02/18/18 1609  GLUCAP 121* 142*      Disposition and Follow-up: Discharge Instructions    Amb Referral to HF Clinic   Complete by:  As directed    Diet Carb Modified   Complete by:  As directed    Increase activity slowly   Complete by:  As directed        DISPOSITION: [facility] information for follow-up providers    [place], Hal, MD. Schedule an appointment as soon as possible for a visit in 2 week(s).   Specialty:  Internal Medicine Contact information: [address] [place] [address] [phone]        Jettie Booze, MD Follow up in 2 week(s).   Specialties:  Cardiology, Radiology, Interventional Cardiology Contact  information: [address] [address] [phone]            Contact information for after-discharge care    Destination    [facility] .   Service:  Skilled Nursing Contact information: [place] [phone]                   Time coordinating discharge:  40-minutes  Signed:   Estill Cotta M.D. [facility] 02/18/2018, 4:51 PM Pager: [phone]   Coding query addendum DM, type 2, uncontrolled with hypoglycemia    Nimah Uphoff M.D. [facility] 02/23/2018, [place] PM  Pager: [phone]

## 2018-02-19 LAB — CULTURE, BLOOD (ROUTINE X 2)
Culture: NO GROWTH
Culture: NO GROWTH
SPECIAL REQUESTS: ADEQUATE
SPECIAL REQUESTS: ADEQUATE

## 2018-02-22 ENCOUNTER — Encounter (INDEPENDENT_AMBULATORY_CARE_PROVIDER_SITE_OTHER): Payer: Self-pay

## 2018-02-22 ENCOUNTER — Encounter: Payer: Self-pay | Admitting: Physician Assistant

## 2018-02-22 ENCOUNTER — Ambulatory Visit: Payer: Medicare Other | Admitting: Physician Assistant

## 2018-02-22 VITALS — BP 110/46 | HR 63 | Ht 60.0 in | Wt 171.0 lb

## 2018-02-22 DIAGNOSIS — I4819 Other persistent atrial fibrillation: Secondary | ICD-10-CM

## 2018-02-22 DIAGNOSIS — D649 Anemia, unspecified: Secondary | ICD-10-CM

## 2018-02-22 DIAGNOSIS — E1151 Type 2 diabetes mellitus with diabetic peripheral angiopathy without gangrene: Secondary | ICD-10-CM

## 2018-02-22 DIAGNOSIS — I5032 Chronic diastolic (congestive) heart failure: Secondary | ICD-10-CM

## 2018-02-22 DIAGNOSIS — I739 Peripheral vascular disease, unspecified: Secondary | ICD-10-CM

## 2018-02-22 DIAGNOSIS — I2510 Atherosclerotic heart disease of native coronary artery without angina pectoris: Secondary | ICD-10-CM | POA: Diagnosis not present

## 2018-02-22 DIAGNOSIS — I779 Disorder of arteries and arterioles, unspecified: Secondary | ICD-10-CM

## 2018-02-22 DIAGNOSIS — I110 Hypertensive heart disease with heart failure: Secondary | ICD-10-CM | POA: Diagnosis not present

## 2018-02-22 DIAGNOSIS — I481 Persistent atrial fibrillation: Secondary | ICD-10-CM

## 2018-02-22 NOTE — Patient Instructions (Signed)
Medication Instructions:  1. Your physician recommends that you continue on your current medications as directed. Please refer to the Current Medication list given to you today.   Labwork: TODAY BMET, CBC  Testing/Procedures: NONE ORDERED TODAY  Follow-Up: KEEP YOUR APPT WITH DR. VARANASI ON 05/11/18  Any Other Special Instructions Will Be Listed Below (If Applicable).     If you need a refill on your cardiac medications before your next appointment, please call your pharmacy.

## 2018-02-22 NOTE — Progress Notes (Signed)
Cardiology Office Note:    Date:  02/22/2018   ID:  [name] [name], DOB [birth year], MRN [id number]  PCP:  Lajean Manes, MD  Cardiologist:  Larae Grooms, MD  Neurologist: Larey Seat, MD Vascular Surgeon: Curt Jews, MD  Referring MD: Lajean Manes, MD   Chief Complaint  Patient presents with  . Hospitalization Follow-up    Admitted with CHF    History of Present Illness:    [name] [name] is a 84 y.o. female with CAD status post CABG in 04/2011, persistent atrial fibrillation, HTN heart disease, HL, diabetes, GERD, carotid stenosis status post left CEA in 2017, PAD status post angioplasty to the left SFA in 02/2012 and stenting of the left popliteal artery in 2018, dementia.   Last seen by Dr. Casandra Doffing in 04/2017.    She was admitted 6/2-6/6 for hypoxic respiratory failure in the setting decompensated congestive heart failure.  She was diuresed with IV furosemide.  Follow-up echo demonstrated moderate focal basal and mild concentric LVH with normal LV function and moderate pulmonary hypertension.  She was discharged to skilled nursing.  [name] returns for follow-up.  She is currently staying at [facility].  She is here today with her daughter.  Since discharge from the hospital, she has remained stable.  Her breathing remains improved.  She denies chest pain, PND, near-syncope or syncope.  She notes chronic lower extremity swelling that is unchanged since discharge from the hospital.  She denies any bleeding issues.  Prior CV studies:   The following studies were reviewed today:  Echo 02/16/2018 Moderate focal basal and mild concentric LVH, EF 60-65, apical anterior and apical septal hypokinesis, MAC, mild MR, severe LAE, mild RAE, moderate TR, mild PI, PASP 55 (moderate pulmonary hypertension)  Carotid US 12/09/2016 R 40-59; L CEA patent  Echocardiogram 07/26/14: Mod conc LVH, severe septal hypertrophy sugg of HCM, no LVOT obstruction, EF 65-70, no RWMA, Gr 1  DD, Ao sclerosis, mod LAE, mild RAE, mod TR, PASP 42  24-hour Holter 08/04/14:  NSR, PACs and PVCs but no significant arrhythmia.  Myoview 07/26/14: Low risk stress nuclear study with a small, mild fixed apical septal perfusion defect. EF is 52% with mild septal hypokinesis. No ischemia. EF 52%. LV Wall Motion: Mild septal hypokinesis.   Nuclear (9/14):   EF 61%, no scar or ischemia  LHC (6/12):   Anteroapical HK, EF 50-55%, LAD 80-90%, circumflex 60% proximal and 70-80% mid, RCA 90% >> CABG (LIMA-LAD, SVG-D1, SVG-OM1, SVG-PDA)   Past Medical History:  Diagnosis Date  . Anemia   . Anxiety   . Arthritis    "right wrist, lower back, maybe my right shoulder" (04/08/2017)  . Atrial fibrillation ([place])   . Bilateral carotid artery stenosis   . Bowel incontinence   . CAD (coronary artery disease)    a. s/p CABG 2012.  [name] Chronic lower back pain    "back is very stiff because of fusion; can't stand up straight more than few moments"  . Closed fracture of unspecified part of neck of femur   . Complication of anesthesia    anesth. meds caused hallucinations   . Cough    no fever, chronic  . Degenerative joint disease   . Dementia   . Depression   . Diverticulitis   . Endometriosis   . Fall at home 05/15/2013  . GERD (gastroesophageal reflux disease)   . Headache   . Hiatal hernia   . History of blood transfusion   .  History of wheezing   . HLD (hyperlipidemia)   . Hx of cardiovascular stress test    Lexiscan Myoview (11/15):  Low risk stress nuclear study with a small, mild fixed apical septal perfusion defect. EF is 52% with mild septal hypokinesis. No ischemia.  [name] Hx of echocardiogram    Echo (11/15):  Mod LVH, severe septal hypertrophy (suggestive HCM), no LVOT obstruction, rest gradient 15 mmHg across AV, vigorous LVF, EF 65-70%, no RWMA, Gr 1 DD, Ao sclerosis (no stenosis), mod LAE, mild RAE, mod TR, PASP 42 mmHg  . Hypertension   . Hypertonicity of bladder   .  Hypertrophic cardiomyopathy ([place])   . Hypothyroidism   . Lumbago   . Muscle weakness (generalized)   . Obesity   . Osteoarthrosis, unspecified whether generalized or localized, unspecified site   . Progressive dementia with uncertain etiology 8/45/3646   Suspect Lewy body .  [name] Reflux esophagitis   . Stroke [facility]) 2011   "old very small TIA/neurologist"  . Type II diabetes mellitus ([place])   . Ulcer    left foot x 2 years sees dr Little Ishikawa podiastrist for wound staying same tiny wart appearing areas no drainage changes dressing every 3 days  . Unspecified glaucoma(365.9)   . Urinary incontinence    Surgical Hx: The patient  has a past surgical history that includes Mitral valve replacement (03/10/12); Posterior fusion lumbar spine; Total knee arthroplasty (~ 2004); Partial hip arthroplasty (Left, 11/2009); Cataract extraction w/ intraocular lens  implant, bilateral (Bilateral); Neuroplasty / transposition median nerve at carpal tunnel bilateral; Hand surgery (Right); Joint replacement; Dilation and curettage of uterus; Scleral buckle (Right, 01/20/2013); Laser photo ablation (Right, 01/20/2013); Carpal tunnel release (Bilateral); Dilation and curettage of uterus; Total shoulder arthroplasty (Right, 10/07/2013); lower extremity angiogram (N/A, 03/10/2012); Retinal detachment surgery (Right, May 2014); Cardiac catheterization (N/A, 05/16/2015); Cardiac catheterization (Left, 05/16/2015); Endarterectomy (Left, 10/01/2015); Back surgery; Coronary artery bypass graft (04/17/2011); Cardiac catheterization; Eye surgery; Cystoscopy (N/A, 12/02/2016); Botox injection (N/A, 12/02/2016); Cholecystectomy; ABDOMINAL AORTOGRAM W/LOWER EXTREMITY (N/A, 04/08/2017); and PERIPHERAL VASCULAR INTERVENTION (04/08/2017).   Current Medications: Current Meds  Medication Sig  . acetaminophen (TYLENOL) 650 MG CR tablet Take 650 mg by mouth every 8 (eight) hours as needed for pain.  [name] apixaban (ELIQUIS) 5 MG TABS tablet Take 1 tablet (5 mg  total) by mouth 2 (two) times daily.  . Ascorbic Acid (VITAMIN C) 1000 MG tablet Take 1,000 mg by mouth 3 (three) times a week.   . cholecalciferol (VITAMIN D) 1000 UNITS tablet Take 1,000 Units by mouth See admin instructions. On Monday, Tuesday, Wednesday, Friday, Saturday.  . COMBIGAN 0.2-0.5 % ophthalmic solution Place 1 drop into both eyes 2 (two) times daily.   . Cyanocobalamin (VITAMIN B 12) 100 MCG LOZG Take 1 tablet by mouth daily.  . ergocalciferol (VITAMIN D2) 50000 UNITS capsule Take 50,000 Units by mouth 2 (two) times a week. On Thursday and Sunday  . felodipine (PLENDIL) 10 MG 24 hr tablet Take 1 tablet (10 mg total) by mouth daily.  . ferrous sulfate 325 (65 FE) MG tablet Take 325 mg by mouth daily with breakfast.  . FLUoxetine (PROZAC) 20 MG capsule Take 20 mg by mouth every morning.   . furosemide (LASIX) 40 MG tablet Take 1 tablet (40 mg total) by mouth daily.  . insulin aspart (NOVOLOG) 100 UNIT/ML injection Inject 0-9 Units into the skin 3 (three) times daily with meals. Sliding scale CBG 70 - 120: 0 units CBG 121 - 150:  1 unit,  CBG 151 - 200: 2 units,  CBG 201 - 250: 3 units,  CBG 251 - 300: 5 units,  CBG 301 - 350: 7 units,  CBG 351 - 400: 9 units   CBG > 400: 9 units and notify your MD  . Insulin Glargine (LANTUS SOLOSTAR) 100 UNIT/ML SOPN Inject 10 Units into the skin at bedtime.   [name] levothyroxine (SYNTHROID, LEVOTHROID) 125 MCG tablet Take 125 mcg by mouth daily before breakfast.   . losartan (COZAAR) 25 MG tablet Take 25 mg by mouth daily.   . memantine (NAMENDA) 10 MG tablet Take 1 tablet (10 mg total) by mouth 2 (two) times daily.  . metoprolol tartrate (LOPRESSOR) 25 MG tablet Take 25 mg by mouth 2 (two) times daily.    . mirabegron ER (MYRBETRIQ) 50 MG TB24 Take 50 mg by mouth every evening.  . potassium chloride SA (K-DUR,KLOR-CON) 20 MEQ tablet Take 1 tablet (20 mEq total) by mouth daily.  . pravastatin (PRAVACHOL) 80 MG tablet Take 1 tablet (80 mg total) by mouth  daily.  . QUEtiapine (SEROQUEL) 25 MG tablet TAKE 1 TABLET BY MOUTH AT BEDTIME  . rivastigmine (EXELON) 4.6 mg/24hr Place 1 patch (4.6 mg total) onto the skin daily.  . silver sulfADIAZINE (SILVADENE) 1 % cream Apply 1 application topically daily.  . traMADol (ULTRAM) 50 MG tablet Take 1-2 tablets (50-100 mg total) by mouth every 6 (six) hours as needed for moderate pain or severe pain.     Allergies:   Amitriptyline; Vesicare [solifenacin]; Vicodin [hydrocodone-acetaminophen]; and Polysaccharide iron complex   Social History   Tobacco Use  . Smoking status: Former Smoker    Packs/day: 1.50    Years: 38.00    Pack years: 57.00    Types: Cigarettes    Last attempt to quit: 09/16/1987    Years since quitting: 30.4  . Smokeless tobacco: Never Used  Substance Use Topics  . Alcohol use: No    Alcohol/week: 0.0 oz  . Drug use: No     Family Hx: The patient's family history includes Cancer in her cousin and paternal aunt; Heart attack in her father and mother; Heart disease in her father; Hyperlipidemia in her father; Hypertension in her father and mother.  ROS:   Please see the history of present illness.    Review of Systems  Hematologic/Lymphatic: Bruises/bleeds easily.  Musculoskeletal: Positive for joint pain.   All other systems reviewed and are negative.   EKGs/Labs/Other Test Reviewed:    EKG:  EKG is  ordered today.  The ekg ordered today demonstrates atrial fibrillation, heart rate 63, normal axis  Recent Labs: 02/14/2018: B Natriuretic Peptide 591.4; Magnesium 1.8; TSH 1.289 02/17/2018: ALT 15; BUN 31; Creatinine, Ser 1.23; Hemoglobin 9.0; Platelets 202; Potassium 4.3; Sodium 139   Recent Lipid Panel Lab Results  Component Value Date/Time   CHOL 157 06/26/2014 11:32 AM   TRIG 250.0 (H) 06/26/2014 11:32 AM   HDL 30.80 (L) 06/26/2014 11:32 AM   CHOLHDL 5 06/26/2014 11:32 AM   LDLCALC 123 (H) 03/20/2014 11:07 AM   LDLDIRECT 73.3 06/26/2014 11:32 AM    Physical Exam:     VS:  BP (!) 110/46   Pulse 63   Ht 5' (1.524 m)   Wt 171 lb (77.6 kg)   SpO2 97%   BMI 33.40 kg/m     Wt Readings from Last 3 Encounters:  02/22/18 171 lb (77.6 kg)  02/18/18 178 lb 9.6 oz (81 kg)  06/02/17 178 lb (80.7 kg)     Physical Exam  Constitutional: She is oriented to person, place, and time. She appears well-developed and well-nourished. No distress.  HENT:  Head: Normocephalic and atraumatic.  Neck: Neck supple.  Cardiovascular: Normal rate, S1 normal, S2 normal and normal heart sounds. An irregularly irregular rhythm present.  No murmur heard. Pulmonary/Chest: Effort normal. She has no rales.  Abdominal: Soft.  Musculoskeletal: She exhibits edema (tight 1+ bilat LE edema).  Neurological: She is alert and oriented to person, place, and time.  Skin: Skin is warm and dry.    ASSESSMENT & PLAN:    Chronic diastolic heart failure ([place]) Status post recent admission to the hospital with decompensated heart failure.  Her volume status appears stable.  Continue current dose of Lasix.  Obtain follow-up BMET today.  Persistent atrial fibrillation ([facility]) Heart rate is controlled.  She remains on Apixaban 5 mg twice daily.  Her weight is above 60 kg and her creatinine has been less than 1.5.  Hypertensive heart disease with chronic diastolic congestive heart failure ([place]) The patient's blood pressure is controlled on her current regimen.  Continue current therapy.   Anemia, unspecified type Hemoglobin was in the 9 range in the hospital.  Repeat CBC today.  Bilateral carotid artery disease, unspecified type ([place]) Followed by vascular surgery.  She is not on aspirin as she is on Apixaban  Coronary artery disease History of CABG.  She denies angina.  She is not on aspirin as she is on Apixaban.  Continue statin therapy.  DM (diabetes mellitus), type 2 with peripheral vascular complications ([place]) She is now off of Actos given her recent admission for heart failure.   Continue follow-up with primary care.   Dispo:  Return in about 3 months (around 05/11/2018) for Scheduled Follow Up with Dr. Irish Lack.   Medication Adjustments/Labs and Tests Ordered: Current medicines are reviewed at length with the patient today.  Concerns regarding medicines are outlined above.  Tests Ordered: Orders Placed This Encounter  Procedures  . Basic Metabolic Panel (BMET)  . CBC  . EKG 12-Lead   Medication Changes: No orders of the defined types were placed in this encounter.   Signed, Richardson Dopp, PA-C  02/22/2018 4:44 PM    [facility] [address], [place], [place]  [address] Phone: [phone]; Fax: [phone]

## 2018-02-23 LAB — BASIC METABOLIC PANEL
BUN / CREAT RATIO: 25 (ref 12–28)
BUN: 30 mg/dL — AB (ref 8–27)
CHLORIDE: 102 mmol/L (ref 96–106)
CO2: 25 mmol/L (ref 20–29)
CREATININE: 1.19 mg/dL — AB (ref 0.57–1.00)
Calcium: 9.2 mg/dL (ref 8.7–10.3)
GFR calc Af Amer: 49 mL/min/{1.73_m2} — ABNORMAL LOW (ref >59)
GFR calc non Af Amer: 43 mL/min/{1.73_m2} — ABNORMAL LOW (ref >59)
GLUCOSE: 153 mg/dL — AB (ref 65–99)
Potassium: 4.6 mmol/L (ref 3.5–5.2)
SODIUM: 141 mmol/L (ref 134–144)

## 2018-02-23 LAB — CBC
HEMATOCRIT: 31.8 % — AB (ref 34.0–46.6)
Hemoglobin: 10 g/dL — ABNORMAL LOW (ref 11.1–15.9)
MCH: 33.8 pg — ABNORMAL HIGH (ref 26.6–33.0)
MCHC: 31.4 g/dL — ABNORMAL LOW (ref 31.5–35.7)
MCV: 107 fL — ABNORMAL HIGH (ref 79–97)
PLATELETS: 284 10*3/uL (ref 150–450)
RBC: 2.96 x10E6/uL — AB (ref 3.77–5.28)
RDW: 14 % (ref 12.3–15.4)
WBC: 7.3 10*3/uL (ref 3.4–10.8)

## 2018-02-25 ENCOUNTER — Telehealth: Payer: Self-pay | Admitting: *Deleted

## 2018-02-25 NOTE — Telephone Encounter (Signed)
-----   Message from Liliane Shi, [place] sent at 02/23/2018  4:58 PM EDT ----- Renal function stable.  Glucose elevated.  Potassium normal.  Hemoglobin stable/improved. Continue current medications and follow up as planned.  Richardson Dopp, PA-C    02/23/2018 4:58 PM

## 2018-02-25 NOTE — Telephone Encounter (Signed)
Tried to reach pt , though no vm for pt's #. DPR for daughter [name] [name] , though no vm came for her as well. I then called other daughter [name] [name] and left message lab results ok. Glucose elevated, K+ normal, kidney function stable, Hgb stable/improved. Please call the office if any questions. I will forward results to Dr. Felipa Eth as well.

## 2018-02-26 ENCOUNTER — Encounter: Payer: Self-pay | Admitting: Family

## 2018-02-26 ENCOUNTER — Ambulatory Visit (INDEPENDENT_AMBULATORY_CARE_PROVIDER_SITE_OTHER)
Admission: RE | Admit: 2018-02-26 | Discharge: 2018-02-26 | Disposition: A | Discharge status: Home / Self Care | Payer: Medicare Other | Source: Ambulatory Visit | Attending: Vascular Surgery | Admitting: Vascular Surgery

## 2018-02-26 ENCOUNTER — Ambulatory Visit (HOSPITAL_COMMUNITY)
Admission: RE | Admit: 2018-02-26 | Discharge: 2018-02-26 | Disposition: A | Discharge status: Home / Self Care | Payer: Medicare Other | Source: Ambulatory Visit | Attending: Vascular Surgery | Admitting: Vascular Surgery

## 2018-02-26 ENCOUNTER — Ambulatory Visit (INDEPENDENT_AMBULATORY_CARE_PROVIDER_SITE_OTHER): Payer: Medicare Other | Admitting: Family

## 2018-02-26 VITALS — BP 124/52 | HR 78 | Temp 97.5°F | Ht 60.0 in | Wt 173.0 lb

## 2018-02-26 DIAGNOSIS — I739 Peripheral vascular disease, unspecified: Secondary | ICD-10-CM | POA: Diagnosis not present

## 2018-02-26 DIAGNOSIS — I6523 Occlusion and stenosis of bilateral carotid arteries: Secondary | ICD-10-CM

## 2018-02-26 DIAGNOSIS — I70229 Atherosclerosis of native arteries of extremities with rest pain, unspecified extremity: Secondary | ICD-10-CM

## 2018-02-26 DIAGNOSIS — E1151 Type 2 diabetes mellitus with diabetic peripheral angiopathy without gangrene: Secondary | ICD-10-CM | POA: Diagnosis not present

## 2018-02-26 DIAGNOSIS — I998 Other disorder of circulatory system: Secondary | ICD-10-CM | POA: Diagnosis not present

## 2018-02-26 DIAGNOSIS — Z87891 Personal history of nicotine dependence: Secondary | ICD-10-CM | POA: Diagnosis not present

## 2018-02-26 DIAGNOSIS — I10 Essential (primary) hypertension: Secondary | ICD-10-CM | POA: Insufficient documentation

## 2018-02-26 DIAGNOSIS — I779 Disorder of arteries and arterioles, unspecified: Secondary | ICD-10-CM | POA: Diagnosis not present

## 2018-02-26 DIAGNOSIS — I70202 Unspecified atherosclerosis of native arteries of extremities, left leg: Secondary | ICD-10-CM | POA: Diagnosis not present

## 2018-02-26 NOTE — Patient Instructions (Addendum)
To decrease swelling in your feet and legs: Elevate feet above slightly bent knees, feet above heart, overnight and 3-4 times per day for 20 minutes.   Stroke Prevention Some health problems and behaviors may make it more likely for you to have a stroke. Below are ways to lessen your risk of having a stroke.  Be active for at least 30 minutes on most or all days.  Do not smoke. Try not to be around others who smoke.  Do not drink too much alcohol. ? Do not have more than 2 drinks a day if you are a man. ? Do not have more than 1 drink a day if you are a woman and are not pregnant.  Eat healthy foods, such as fruits and vegetables. If you were put on a specific diet, follow the diet as told.  Keep your cholesterol levels under control through diet and medicines. Look for foods that are low in saturated fat, trans fat, cholesterol, and are high in fiber.  If you have diabetes, follow all diet plans and take your medicine as told.  Ask your doctor if you need treatment to lower your blood pressure. If you have high blood pressure (hypertension), follow all diet plans and take your medicine as told by your doctor.  If you are 71-24 years old, have your blood pressure checked every 3-5 years. If you are age 93 or older, have your blood pressure checked every year.  Keep a healthy weight. Eat foods that are low in calories, salt, saturated fat, trans fat, and cholesterol.  Do not take drugs.  Avoid birth control pills, if this applies. Talk to your doctor about the risks of taking birth control pills.  Talk to your doctor if you have sleep problems (sleep apnea).  Take all medicine as told by your doctor. ? You may be told to take aspirin or blood thinner medicine. Take this medicine as told by your doctor. ? Understand your medicine instructions.  Make sure any other conditions you have are being taken care of.  Get help right away if:  You suddenly lose feeling (you feel numb)  or have weakness in your face, arm, or leg.  Your face or eyelid hangs down to one side.  You suddenly feel confused.  You have trouble talking (aphasia) or understanding what people are saying.  You suddenly have trouble seeing in one or both eyes.  You suddenly have trouble walking.  You are dizzy.  You lose your balance or your movements are clumsy (uncoordinated).  You suddenly have a very bad headache and you do not know the cause.  You have new chest pain.  Your heart feels like it is fluttering or skipping a beat (irregular heartbeat). Do not wait to see if the symptoms above go away. Get help right away. Call your local emergency services (911 in [place] Do not drive yourself to the hospital. This information is not intended to replace advice given to you by your health care provider. Make sure you discuss any questions you have with your health care provider. Document Released: 03/02/2012 Document Revised: 02/07/2016 Document Reviewed: 03/04/2013 Elsevier Interactive Patient Education  2018 [employer].    Peripheral Vascular Disease Peripheral vascular disease (PVD) is a disease of the blood vessels that are not part of your heart and brain. A simple term for PVD is poor circulation. In most cases, PVD narrows the blood vessels that carry blood from your heart to the rest of your  body. This can result in a decreased supply of blood to your arms, legs, and internal organs, like your stomach or kidneys. However, it most often affects a person's lower legs and feet. There are two types of PVD.  Organic PVD. This is the more common type. It is caused by damage to the structure of blood vessels.  Functional PVD. This is caused by conditions that make blood vessels contract and tighten (spasm).  Without treatment, PVD tends to get worse over time. PVD can also lead to acute ischemic limb. This is when an arm or limb suddenly has trouble getting enough blood. This is a  medical emergency. Follow these instructions at home:  Take medicines only as told by your doctor.  Do not use any tobacco products, including cigarettes, chewing tobacco, or electronic cigarettes. If you need help quitting, ask your doctor.  Lose weight if you are overweight, and maintain a healthy weight as told by your doctor.  Eat a diet that is low in fat and cholesterol. If you need help, ask your doctor.  Exercise regularly. Ask your doctor for some good activities for you.  Take good care of your feet. ? Wear comfortable shoes that fit well. ? Check your feet often for any cuts or sores. Contact a doctor if:  You have cramps in your legs while walking.  You have leg pain when you are at rest.  You have coldness in a leg or foot.  Your skin changes.  You are unable to get or have an erection (erectile dysfunction).  You have cuts or sores on your feet that are not healing. Get help right away if:  Your arm or leg turns cold and blue.  Your arms or legs become red, warm, swollen, painful, or numb.  You have chest pain or trouble breathing.  You suddenly have weakness in your face, arm, or leg.  You become very confused or you cannot speak.  You suddenly have a very bad headache.  You suddenly cannot see. This information is not intended to replace advice given to you by your health care provider. Make sure you discuss any questions you have with your health care provider. Document Released: 11/26/2009 Document Revised: 02/07/2016 Document Reviewed: 02/09/2014 Elsevier Interactive Patient Education  2017 [employer].

## 2018-02-26 NOTE — Progress Notes (Signed)
[facility] [place]   CC: Follow up peripheral artery occlusive disease  History of Present Illness [name] [name] is a 84 y.o. female whom Dr. Donnetta Hutching had seen her in the office with progressive nonhealing ulceration over the lateral aspect of her left foot.  She has had prior vein harvest bilaterally for coronary artery bypass grafting.  She underwent arteriography with Dr. Donzetta Matters on 04/08/2017 and was found to have a left popliteal occlusion. She underwent a covered stenting of this.  She is also s/p left CEA on 10-01-15 by Dr. Donnetta Hutching.   There is no trace of an ulcer in her left foot today.  The nursing facility is treating a wound on her right shin, pt does not know how this wound came about.   She has dependent edema in her calves.   Dr. Donnetta Hutching last evaluated pt on 06-02-17. At that time excellent result from her left popliteal stenting. Will continue her usual activities. We will see her in 6 months with duplex follow-up.   She was admitted to [facility] via the ED on 02-14-18 for exacerbation of CHF. She is in rehab at [facility] since this.   She has documented dementia, her neurologist is Dr. Brett Fairy.  Dr. Irish Lack is her cardiologist.   Pt states she walks with a walker.   Pt denies ever having a stroke, but her past medical history documents stroke in 2011. She is oriented x 3.     Diabetic: Yes, A1C was 5.7 on 02-14-18 Tobacco use: former smoker, quit in 1989, smoked x 38 years   Pt meds include: Statin :Yes ASA: No Other anticoagulants/antiplatelets: Eliquis, hx of atrial fib  Past Medical History:  Diagnosis Date  . Anemia   . Anxiety   . Arthritis    "right wrist, lower back, maybe my right shoulder" (04/08/2017)  . Atrial fibrillation ([place])   . Bilateral carotid artery stenosis   . Bowel incontinence   . CAD (coronary artery disease)    a. s/p CABG 2012.  [name] Chronic lower back pain    "back is very stiff because of fusion; can't stand up  straight more than few moments"  . Closed fracture of unspecified part of neck of femur   . Complication of anesthesia    anesth. meds caused hallucinations   . Cough    no fever, chronic  . Degenerative joint disease   . Dementia   . Depression   . Diverticulitis   . Endometriosis   . Fall at home 05/15/2013  . GERD (gastroesophageal reflux disease)   . Headache   . Hiatal hernia   . History of blood transfusion   . History of wheezing   . HLD (hyperlipidemia)   . Hx of cardiovascular stress test    Lexiscan Myoview (11/15):  Low risk stress nuclear study with a small, mild fixed apical septal perfusion defect. EF is 52% with mild septal hypokinesis. No ischemia.  [name] Hx of echocardiogram    Echo (11/15):  Mod LVH, severe septal hypertrophy (suggestive HCM), no LVOT obstruction, rest gradient 15 mmHg across AV, vigorous LVF, EF 65-70%, no RWMA, Gr 1 DD, Ao sclerosis (no stenosis), mod LAE, mild RAE, mod TR, PASP 42 mmHg  . Hypertension   . Hypertonicity of bladder   . Hypertrophic cardiomyopathy ([place])   . Hypothyroidism   . Lumbago   . Muscle weakness (generalized)   . Obesity   . Osteoarthrosis, unspecified whether generalized or localized, unspecified  site   . Progressive dementia with uncertain etiology 5/91/6384   Suspect Lewy body .  [name] Reflux esophagitis   . Stroke [facility]) 2011   "old very small TIA/neurologist"  . Type II diabetes mellitus ([place])   . Ulcer    left foot x 2 years sees dr Little Ishikawa podiastrist for wound staying same tiny wart appearing areas no drainage changes dressing every 3 days  . Unspecified glaucoma(365.9)   . Urinary incontinence     Social History Social History   Tobacco Use  . Smoking status: Former Smoker    Packs/day: 1.50    Years: 38.00    Pack years: 57.00    Types: Cigarettes    Last attempt to quit: 09/16/1987    Years since quitting: 30.4  . Smokeless tobacco: Never Used  Substance Use Topics  . Alcohol use: No    Alcohol/week:  0.0 oz  . Drug use: No    Family History Family History  Problem Relation Age of Onset  . Cancer Cousin   . Heart attack Mother   . Hypertension Mother   . Heart attack Father   . Heart disease Father        Before age 103  . Hyperlipidemia Father   . Hypertension Father   . Cancer Paternal Aunt     Past Surgical History:  Procedure Laterality Date  . ABDOMINAL AORTOGRAM W/LOWER EXTREMITY N/A 04/08/2017   Procedure: Abdominal Aortogram w/Lower Extremity;  Surgeon: Waynetta Sandy, MD;  Location: [place] [facility];  Service: Cardiovascular;  Laterality: N/A;  . BACK SURGERY     X2 lower  . BOTOX INJECTION N/A 12/02/2016   Procedure: BOTOX INJECTION;  Surgeon: Bjorn Loser, MD;  Location: [facility];  Service: Urology;  Laterality: N/A;  . CARDIAC CATHETERIZATION    . CARPAL TUNNEL RELEASE Bilateral   . CATARACT EXTRACTION W/ INTRAOCULAR LENS  IMPLANT, BILATERAL Bilateral   . CHOLECYSTECTOMY    . CORONARY ARTERY BYPASS GRAFT  04/17/2011   LT  internal mammary artery to left anterior descending, saphenous vein graft first diagonal, saphenous  vein graft to obtuse marginal 1, saphenous vein graft to posterior descending  . CYSTOSCOPY N/A 12/02/2016   Procedure: CYSTOSCOPY;  Surgeon: Bjorn Loser, MD;  Location: [facility];  Service: Urology;  Laterality: N/A;  . DILATION AND CURETTAGE OF UTERUS     "for endometrosis"  . DILATION AND CURETTAGE OF UTERUS     ENDOMETRIOSIS  . ENDARTERECTOMY Left 10/01/2015   Procedure: Left CAROTID Endartarectomy;  Surgeon: Rosetta Posner, MD;  Location: [place];  Service: Vascular;  Laterality: Left;  . EYE SURGERY    . HAND SURGERY Right    "for arthritis;"  . JOINT REPLACEMENT    . LASER PHOTO ABLATION Right 01/20/2013   Procedure: LASER PHOTO ABLATION;  Surgeon: Hayden Pedro, MD;  Location: [place];  Service: Ophthalmology;  Laterality: Right;  . LOWER EXTREMITY ANGIOGRAM N/A 03/10/2012   Procedure: LOWER EXTREMITY ANGIOGRAM;  Surgeon:  Jettie Booze, MD;  Location: [facility] C[facility] LAB;  Service: Cardiovascular;  Laterality: N/A;  . NEUROPLASTY / TRANSPOSITION MEDIAN NERVE AT CARPAL TUNNEL BILATERAL    . PARTIAL HIP ARTHROPLASTY Left 11/2009   "just the ball"  . PERIPHERAL VASCULAR CATHETERIZATION N/A 05/16/2015   Procedure: Abdominal Aortogram;  Surgeon: Elam Dutch, MD;  Location: [place] [facility];  Service: Cardiovascular;  Laterality: N/A;  . PERIPHERAL VASCULAR CATHETERIZATION Left 05/16/2015   Procedure: Lower Extremity Angiography;  Surgeon: Elam Dutch, MD;  Location: [place] [facility];  Service: Cardiovascular;  Laterality: Left;  . PERIPHERAL VASCULAR INTERVENTION  04/08/2017   Procedure: Peripheral Vascular Intervention;  Surgeon: Waynetta Sandy, MD;  Location: [place] [facility];  Service: Cardiovascular;;  lt.Popliteal  . POSTERIOR FUSION LUMBAR SPINE     "removed L4-5, S1"  . PTCA  03/10/12   LLE  . RETINAL DETACHMENT SURGERY Right May 2014  . SCLERAL BUCKLE Right 01/20/2013   Procedure: SCLERAL BUCKLE;  Surgeon: Hayden Pedro, MD;  Location: [place];  Service: Ophthalmology;  Laterality: Right;  . TOTAL KNEE ARTHROPLASTY  ~ 2004   right  . TOTAL SHOULDER ARTHROPLASTY Right 10/07/2013   Procedure: TOTAL SHOULDER ARTHROPLASTY;  Surgeon: Alta Corning, MD;  Location: [place];  Service: Orthopedics;  Laterality: Right;    Allergies  Allergen Reactions  . Amitriptyline Other (See Comments)    hallucinations  . Vesicare [Solifenacin] Other (See Comments)    hallucinations  . Vicodin [Hydrocodone-Acetaminophen] Other (See Comments)    Hallucinations - do NOT verify Vicodin orders  . Polysaccharide Iron Complex Hives, Itching and Rash    Patient can tolerate Ferrous Sulfate    Current Outpatient Medications  Medication Sig Dispense Refill  . acetaminophen (TYLENOL) 650 MG CR tablet Take 650 mg by mouth every 8 (eight) hours as needed for pain.    [name] apixaban (ELIQUIS) 5 MG TABS tablet Take 1  tablet (5 mg total) by mouth 2 (two) times daily. 60 tablet 11  . Ascorbic Acid (VITAMIN C) 1000 MG tablet Take 1,000 mg by mouth 3 (three) times a week.     . cholecalciferol (VITAMIN D) 1000 UNITS tablet Take 1,000 Units by mouth See admin instructions. On Monday, Tuesday, Wednesday, Friday, Saturday.    . COMBIGAN 0.2-0.5 % ophthalmic solution Place 1 drop into both eyes 2 (two) times daily.   0  . Cyanocobalamin (VITAMIN B 12) 100 MCG LOZG Take 1 tablet by mouth daily.    . ergocalciferol (VITAMIN D2) 50000 UNITS capsule Take 50,000 Units by mouth 2 (two) times a week. On Thursday and Sunday    . felodipine (PLENDIL) 10 MG 24 hr tablet Take 1 tablet (10 mg total) by mouth daily. 30 tablet 6  . ferrous sulfate 325 (65 FE) MG tablet Take 325 mg by mouth daily with breakfast.    . FLUoxetine (PROZAC) 20 MG capsule Take 20 mg by mouth every morning.     . furosemide (LASIX) 40 MG tablet Take 1 tablet (40 mg total) by mouth daily. 30 tablet   . insulin aspart (NOVOLOG) 100 UNIT/ML injection Inject 0-9 Units into the skin 3 (three) times daily with meals. Sliding scale CBG 70 - 120: 0 units CBG 121 - 150: 1 unit,  CBG 151 - 200: 2 units,  CBG 201 - 250: 3 units,  CBG 251 - 300: 5 units,  CBG 301 - 350: 7 units,  CBG 351 - 400: 9 units   CBG > 400: 9 units and notify your MD 10 mL 11  . Insulin Glargine (LANTUS SOLOSTAR) 100 UNIT/ML SOPN Inject 10 Units into the skin at bedtime.     [name] levothyroxine (SYNTHROID, LEVOTHROID) 125 MCG tablet Take 125 mcg by mouth daily before breakfast.     . losartan (COZAAR) 25 MG tablet Take 25 mg by mouth daily.   0  . memantine (NAMENDA) 10 MG tablet Take 1 tablet (10 mg total) by mouth 2 (  two) times daily. 180 tablet 3  . metoprolol tartrate (LOPRESSOR) 25 MG tablet Take 25 mg by mouth 2 (two) times daily.      . mirabegron ER (MYRBETRIQ) 50 MG TB24 Take 50 mg by mouth every evening.    . potassium chloride SA (K-DUR,KLOR-CON) 20 MEQ tablet Take 1 tablet (20 mEq  total) by mouth daily.    . pravastatin (PRAVACHOL) 80 MG tablet Take 1 tablet (80 mg total) by mouth daily. 90 tablet 2  . QUEtiapine (SEROQUEL) 25 MG tablet TAKE 1 TABLET BY MOUTH AT BEDTIME 30 tablet 9  . rivastigmine (EXELON) 4.6 mg/24hr Place 1 patch (4.6 mg total) onto the skin daily. 30 patch 12  . silver sulfADIAZINE (SILVADENE) 1 % cream Apply 1 application topically daily. 50 g 0  . traMADol (ULTRAM) 50 MG tablet Take 1-2 tablets (50-100 mg total) by mouth every 6 (six) hours as needed for moderate pain or severe pain. 10 tablet 0   No current facility-administered medications for this visit.     ROS: See HPI for pertinent positives and negatives.   Physical Examination  Vitals:   02/26/18 1325 02/26/18 1333  BP: (!) 129/54 (!) 124/52  Pulse: 78   Temp: (!) 97.5 F (36.4 C)   SpO2: 98%   Weight: 173 lb (78.5 kg)   Height: 5' (1.524 m)    Body mass index is 33.79 kg/m.  General: A&O x 3, WDWN, obese female. Gait: not observed, seated in w/c HENT: No gross abnormalities.  Eyes: PERRLA. Pulmonary: Respirations are non labored, CTAB, good air movement Cardiac: irregular rhythm, controlled rate, no detected murmur.         Carotid Bruits Right Left   Positive Positive   Radial pulses are 2+ palpable bilaterally   Adominal aortic pulse is not palpable                         VASCULAR EXAM: Extremities without ischemic changes, without Gangrene; with open wounds on right shin, dressing in place, treated by nursing facility.                                                                                                           LE Pulses Right Left       FEMORAL  not palpable seated in w/c  not palpable seated in w/c        POPLITEAL  not palpable   not palpable       POSTERIOR TIBIAL  not palpable   not palpable        DORSALIS PEDIS      ANTERIOR TIBIAL not palpable  not palpable    Abdomen: soft, NT, no palpable masses. Skin: no rashes, no cellulitis,  no ulcers noted. Musculoskeletal: no muscle wasting or atrophy.  Neurologic: A&O X 3; appropriate affect, Sensation is normal; MOTOR FUNCTION:  moving all extremities equally, motor strength 4/5 throughout. Speech is fluent/normal. CN 2-12 intact. Psychiatric: Thought content is normal, mood appropriate for clinical situation. [place]  ASSESSMENT: DARCIE MELLONE is a 84 y.o. female who is s/p  Stent placement of left popliteal artery on 04-08-17 for a left  left popliteal occlusion. She is also s/p left CEA on 10-01-15 by Dr. Donnetta Hutching.  Pt denies ever having a stroke, but her past medical history documents stroke in 2011, did not seem to have any subsequent stroke or TIA, but pt is a vague historian. She is oriented x 3.   I spoke with Dr. Donzetta Matters re pt HPI, healed left foot ulcer, and 391 cm/s velocity distal to left popliteal stent. Return in 6 months with ABI's,  left LE arterial duplex, and carotid duplex.  Pt only seems to ambulate when she is receiving physical therapy at the nursing facility, she does not c/o claudication sx's with walking.  No signs of ischemia in her feet or legs.   She takes a daily statin and Eliquis for atrial fib.    DATA  Carotid Duplex (02-26-18): Right ICA: 60-79%  Stenosis Left ICA: CEA site with no restenosis Right ECA: >50% stenosis Bilateral vertebral artery flow is antegrade.  Bilateral subclavian artery waveforms are normal.  Increased stenosis in the right ICA compared to the exam on 12-09-16.   Left LE Arterial Duplex (02-26-18): No stenosis in the left popliteal stent, but 391 cm/s velocity distal to stent.  All monophasic waveforms.    ABI (Date: 02/26/2018):  R:   ABI: 0.82 (was 0.89 on 06-02-17),   PT: mono  DP: mono  TBI:  0.31 (was 0.16)  L:   ABI: 0.76 (was 0.83),   PT: waveform morphology no documented, monophasic in stent dupkex  DP: waveform morphology no documented, monophasic in stent dupkex  TBI: 0.35 (was  0.46)  Slight decline in bilateral ABI, mild disease in the right, moderate in the left.  Improved right TBI, slight decline in left TBI.     PLAN:  Based on the patient's vascular studies and examination, pt will return to clinic in 6 months with ABI's,  left LE arterial duplex, and carotid duplex.   I discussed in depth with the patient the nature of atherosclerosis, and emphasized the importance of maximal medical management including strict control of blood pressure, blood glucose, and lipid levels, obtaining regular exercise, and continued cessation of smoking.  The patient is aware that without maximal medical management the underlying atherosclerotic disease process will progress, limiting the benefit of any interventions.  The patient was given information about PAD including signs, symptoms, treatment, what symptoms should prompt the patient to seek immediate medical care, and risk reduction measures to take.  Clemon Chambers, RN, MSN, FNP-C [facility] Phone: [phone]  Clinic MD: Donzetta Matters on call  02/26/18 1:45 PM

## 2018-02-28 ENCOUNTER — Encounter: Payer: Self-pay | Admitting: Family

## 2018-03-03 ENCOUNTER — Other Ambulatory Visit: Payer: Self-pay

## 2018-03-03 DIAGNOSIS — I779 Disorder of arteries and arterioles, unspecified: Secondary | ICD-10-CM

## 2018-03-03 DIAGNOSIS — I6523 Occlusion and stenosis of bilateral carotid arteries: Secondary | ICD-10-CM

## 2018-03-16 ENCOUNTER — Other Ambulatory Visit: Payer: Self-pay | Admitting: Geriatric Medicine

## 2018-03-16 DIAGNOSIS — I509 Heart failure, unspecified: Secondary | ICD-10-CM

## 2018-03-16 DIAGNOSIS — E1151 Type 2 diabetes mellitus with diabetic peripheral angiopathy without gangrene: Secondary | ICD-10-CM

## 2018-03-26 ENCOUNTER — Ambulatory Visit
Admission: RE | Admit: 2018-03-26 | Discharge: 2018-03-26 | Disposition: A | Discharge status: Home / Self Care | Payer: Medicare Other | Source: Ambulatory Visit | Attending: Geriatric Medicine | Admitting: Geriatric Medicine

## 2018-03-26 DIAGNOSIS — E1151 Type 2 diabetes mellitus with diabetic peripheral angiopathy without gangrene: Secondary | ICD-10-CM

## 2018-04-02 ENCOUNTER — Other Ambulatory Visit: Payer: Self-pay | Admitting: Neurology

## 2018-04-02 DIAGNOSIS — F0390 Unspecified dementia without behavioral disturbance: Secondary | ICD-10-CM

## 2018-04-27 ENCOUNTER — Encounter: Payer: Self-pay | Admitting: Neurology

## 2018-04-27 ENCOUNTER — Ambulatory Visit: Payer: Medicare Other | Admitting: Neurology

## 2018-04-27 VITALS — BP 118/68 | HR 85 | Ht 60.0 in | Wt 153.0 lb

## 2018-04-27 DIAGNOSIS — I5033 Acute on chronic diastolic (congestive) heart failure: Secondary | ICD-10-CM

## 2018-04-27 DIAGNOSIS — G3183 Dementia with Lewy bodies: Secondary | ICD-10-CM

## 2018-04-27 DIAGNOSIS — I739 Peripheral vascular disease, unspecified: Secondary | ICD-10-CM

## 2018-04-27 DIAGNOSIS — I2510 Atherosclerotic heart disease of native coronary artery without angina pectoris: Secondary | ICD-10-CM

## 2018-04-27 DIAGNOSIS — I255 Ischemic cardiomyopathy: Secondary | ICD-10-CM | POA: Insufficient documentation

## 2018-04-27 DIAGNOSIS — I480 Paroxysmal atrial fibrillation: Secondary | ICD-10-CM

## 2018-04-27 DIAGNOSIS — F0280 Dementia in other diseases classified elsewhere without behavioral disturbance: Secondary | ICD-10-CM

## 2018-04-27 DIAGNOSIS — G4734 Idiopathic sleep related nonobstructive alveolar hypoventilation: Secondary | ICD-10-CM | POA: Diagnosis not present

## 2018-04-27 NOTE — Patient Instructions (Signed)
Venous Ulcer A venous ulcer is a shallow sore on your lower leg that is caused by poor circulation in your veins. This condition used to be called stasis ulcer. Veins have valves that help return blood to the heart. If these valves do not work properly, it can cause blood to flow backward and to back up into the veins near the skin. When that happens, blood can pool in your lower legs. The blood can then leak out of your veins, which can irritate your skin. This may cause a break in your skin that becomes a venous ulcer. Venous ulcer is the most common type of lower leg ulcer. You may have venous ulcers on one leg or on both legs. The area where this condition most commonly develops is around the ankles. A venous ulcer may last for a long time (chronic ulcer) or it may return repeatedly (recurrent ulcer). What are the causes? Any condition that causes poor circulation to your legs can lead to a venous ulcer. What increases the risk? This condition is more likely to develop in:  People who are 65 years of age or older.  People who are overweight.  People who are not active.  People who have had a leg ulcer in the past.  People who have clots in their lower leg veins (deep vein thrombosis).  People who have inflammation of their leg veins (phlebitis).  Women who have given birth.  People who smoke.  What are the signs or symptoms? The most common symptom of this condition is an open sore near your ankle. Other symptoms may include:  Swelling.  Thickening of the skin.  Fluid leaking from the ulcer.  Bleeding.  Itching.  Pain and swelling that gets worse when you stand up and feels better when you raise your leg.  Blotchy skin.  Darkening of the skin.  How is this diagnosed? Your health care provider may suspect a venous ulcer based on your medical history and your risk factors. Your health care provider will check the skin on your legs. Other tests may be done to learn more  about the ulcer and to determine the best way to treat it. Tests that may be done include:  Measuring the blood pressure in your arms and legs.  Using sound waves (ultrasound) to measure the blood flow in your leg veins.  How is this treated? You may need to try several different types of treatment to get your venous ulcer to heal. Healing may take a long time. Treatment may include:  Keeping your leg raised (elevated).  Wearing a type of bandage or stocking to compress the veins of your leg (compression therapy). Venous wounds are not likely to heal or to stay healed without compression.  Taking medicines to improve blood flow.  Taking antibiotic medicines to treat infection.  Cleaning your ulcer and removing any dead tissue from the wound (debridement).  Placing various types of medicated bandage (dressings) or medicated wraps on your ulcer. This helps the ulcer to heal and helps to prevent infection.  Surgery is sometimes needed to close the wound using a piece of skin taken from another area of your body (graft). You may need surgery if other treatments are not working or if your ulcer is very deep. Follow these instructions at home: Wound care  Follow instructions from your health care provider about: ? How to take care of your wound. ? When and how you should change your bandage (dressing). ? When you should   remove your dressing. If your dressing is dry and sticks to your leg when you try to remove it, moisten or wet the dressing with saline solution or water so that the dressing can be removed without harming your skin or wound tissue.  Check your wound every day for signs of infection. Have a caregiver do this for you if you are not able to do it yourself. Check for: ? More redness, swelling, or pain. ? More fluid or blood. ? Pus, warmth, or a bad smell. Medicines  Take over-the-counter and prescription medicines only as told by your health care provider.  If you were  prescribed an antibiotic medicine, take it or apply it as told by your health care provider. Do not stop taking or using the antibiotic even if your condition improves. Activity  Do not stand or sit in one position for a long period of time. Rest with your legs raised during the day. If possible, keep your legs above your heart for 30 minutes, 3-4 times a day, or as told by your health care provider.  Do not sit with your legs crossed.  Walk often to increase the blood flow in your legs.Ask your health care provider what level of activity is safe for you.  If you are taking a long ride in a car or plane, take a break to walk around at least once every two hours, or as often as your health care provider recommends. Ask your health care provider if you should take aspirin before long trips. General instructions   Wear elastic stockings, compression stockings, or support hose as told by your health care provider. This is very important.  Raise the foot of your bed as told by your health care provider.  Do not smoke.  Keep all follow-up visits as told by your health care provider. This is important. Contact a health care provider if:  You have a fever.  Your ulcer is getting larger or is not healing.  Your pain gets worse.  You have more redness or swelling around your ulcer.  You have more fluid, blood, or pus coming from your ulcer after it has been cleaned by you or your health care provider.  You have warmth or a bad smell coming from your ulcer. This information is not intended to replace advice given to you by your health care provider. Make sure you discuss any questions you have with your health care provider. Document Released: 05/27/2001 Document Revised: 02/07/2016 Document Reviewed: 01/10/2015 Elsevier Interactive Patient Education  2018 [employer]  

## 2018-04-27 NOTE — Progress Notes (Signed)
[facility]  Provider:  Larey Seat, M D  Referring Provider: Lajean Manes, MD Primary Care Physician:  Lajean Manes, MD   04-27-2018, RV for [name] [name], a 84 year old female patient with memory loss.  [name] [name] is now living in her private home with a caregiver that also resides there at night.  This way managing her medications has been easier preparing her meals, she had rare falls. She was in hospital in June- with hypoxemia, CHF, acute on chronic diastolic, possible atrial fibrillation. .  The patient carries a diagnosis of chronic congestive heart failure, diastolic.  However she was so short of breath probably because she has an acute on chronic diastolic failure which caused the hypoxemia, fluid retention and she had to be diuresed. She has been sleepy.  After she lost about 10 pounds in fluid her breathing was much unlabored, she also carries a diagnosis of a cardiomyopathy.  She has  seen Dr. Felipa Eth and [facility]. Actose was discontinued , humolog by sliding scale  and insulin -lantus. She was placed on diuretics. She spend 14 days of rehab- time at [facility]. She returns for evaluation of memory (Lewy body ) and gait.    10/28/17 [name] [name] is an 84 year old female with a history of memory disturbance.  Patient returns today for evaluation.  At the last visit she was started on Exelon patch.  She reports that she is tolerating that well.  She is here today with her caregiver.  She lives at home alone.  She does have a caregiver that comes several times a week to help her.  She reports that she is able to complete all ADLs independently.  She reports that she sometimes has trouble managing her medications.  She prepares her own meals without difficulty.  Denies hallucinations.  She returns today for evaluation.    05-13-2017 The patient had developed some pressure ulcers on the left foot, she was found to have poor circulation, a stent was placed at the femoral  artery, and she now expects wound healing to progress quicker. In the time since May last visit with her left been some safety features implemented, she is not walking without a walker. She still has physical therapy in the home now, and had a home safety inspection. Some furniture had to be moved and some rugs were removed, and she also got grab bars in her bathroom. Today's memory test was a Mini-Mental Status Examination and she scored 24 out of 30 points. The patient has noted by herself that she is more memory impaired, she is not always sure if home health or physical therapy has already visited her or not, it may be helpful for them to sign a paper and leave it in the room with a date for that she can later on the same day remind herself. They may just sign onto her visibly posted calendar. She has no new hallucinations. She has parkinsonian gait features.     MMSE - Mini Mental State Exam 04/27/2018 10/28/2017 05/13/2017 05/13/2017 01/22/2017 08/19/2016 02/12/2016  Orientation to time 4 5 4 4 5 4 4   Orientation to Place 5 4 5 5 5 4 4   Registration 3 3 3 3 3 3 3   Attention/ Calculation 5 3 2  - 2 5 1   Attention/Calculation-comments couldnt do numbers - - - - - -  Recall 1 0 2 - 1 1 1   Language- name 2 objects 2 2 2  - 2 2 2   Language-  repeat 1 1 1  - 0 1 0  Language- follow 3 step command 3 3 3  - 3 3 3   Language- read & follow direction 0 1 1 - 1 1 1   Write a sentence 1 1 1  - 1 1 1   Copy design 0 1 0 - 0 1 0  Total score 25 24 24  - 23 26 20          HPI: 01-22-2017,  [name] [name] has been an established patient since 2011 in the [facility] practice. She was initially referred here for mild memory loss and hallucinations.  The hallucinations began after a hip replacement in March 2011, after which she moved to rehabilitation at [facility]. She had experienced a noticeable difference in her handwriting and a left facial droop but a CT scan was negative for stroke. In August 2013 she left [facility],  and then returned home and now again lives alone.  Her Mini-Mental Status Examination was 29 of 30 points or more in 2013 ,  Fall risk was 10 points in 2013 .  In December 2013  return for follow up with another  MMSE., which documented 26/30 , followed by [facility] testing at  16/30 points.  I repeated the [place]  at 13/30 points.  Her fall risk was  increased to 15 points, GDS (geriatric depression score) endorsed at 5 points. The patient underwent another eye surgery on May 8th of this year. She had decreasing visual acuity , her right eye remained red, puffy  and it burned. She has gotten confused when walking to the local bank, has been getting lost on familiar roads and she is not longer permitted to drive.  The 2011  hallucinations were described as " a strong sense that a snake was in the room " ,that there were snakes in her house-  she also has heard a hissing  sounds and rales and feared that the snake was in her room.. She called the police feel that somebody would break into a home,13 times- and as she recalled did not get a warm response. This was likely related to the use of pain medication, inducing hallucinations. With the beginning of treatment with Seroquel , her  hallucinations resolved. She still reports vivid and at  times crazy dreams. REM BD.  There is no documented nocturnal activity for the last 12 months, but the patient also lives along and has no witnesses. Her occupational and physical therapists have had visits after she fell Christmas 2017, changed furniture and removed rugs.  She fell on 01-20-2017 after midnight, trying to manage waling through a dark hallway. She couldn't reach the light. She fell and injured her left chest /ribcage, and fell into her walker, it may have gotten cought in furniture. She was recently seen by Dr. Felipa Eth and found the diagnosis of alzheimer's listed, which she had never heard from me. We discuss safety and diagnosis with her daughter present.       CM - visit  11- 2015 .This patient had undergone a shoulder replacement in January 2015 and went again to Rehab at [facility] for a 2 month period, followed by out patient therapy until last week.  [name] [name] has followed her for eye-care, and she reported her right eye remained irritated and itching. Her eyes are very dry. She has used Restasis for several years, but stopped because of the price. She is on Lumigan eye drops still. She is conversant and pleasant. The patient was  not longer aware that she had gotten lost several times. The daughter reminded her that these events truly took place. She returned home and in September was seen by her cardiologist for a possible TIA , numbness of the left arm- she presented to urgent care first ( Dr. Michail Sermon is PCP) . In the past she had a transient left facial droop. She had carotid doppler study with Dr. Irish Lack in September, those were less than 70% , Dr Early agreed and  suggested q 6 month follow up , left  Carotid stenosis of 65% and 45 % on the right.  She has completely recovered. Given her chronic a fib on ASA, I would not like to add any anticoagulation- she is at high fall risk.  MOCA today 08-02-14 at only 21 -30 points, not confused, but more delayed in her responses.    Review of Systems: Out of a complete 14 system review, the patient complains of only the following symptoms, and all other reviewed systems are negative. Vision loss, fall risk high, does not walk today , but is seated in a wheelchair.   Memory loss, high degree of fatigue, vision is limited, burning pain in the right eye,  had retinal detachment. She has bowel and urinary incontinence for 30 month . No recent hallucinations- has had visual hallucination. On anticoagulation. Seroquel has helped, OCD is present.      Current Outpatient Medications  Medication Sig Dispense Refill  . acetaminophen (TYLENOL) 650 MG CR tablet Take 650 mg by mouth every 8 (eight)  hours as needed for pain.    [name] alendronate (FOSAMAX) 70 MG tablet TK 1 T PO ONCE A WEEK  11  . apixaban (ELIQUIS) 5 MG TABS tablet Take 1 tablet (5 mg total) by mouth 2 (two) times daily. 60 tablet 11  . Ascorbic Acid (VITAMIN C) 1000 MG tablet Take 1,000 mg by mouth 3 (three) times a week.     . cholecalciferol (VITAMIN D) 1000 UNITS tablet Take 1,000 Units by mouth See admin instructions. On Monday, Tuesday, Wednesday, Friday, Saturday.    . COMBIGAN 0.2-0.5 % ophthalmic solution Place 1 drop into both eyes 2 (two) times daily.   0  . Cyanocobalamin (VITAMIN B 12) 100 MCG LOZG Take 1 tablet by mouth daily.    . ergocalciferol (VITAMIN D2) 50000 UNITS capsule Take 50,000 Units by mouth 2 (two) times a week. On Thursday and Sunday    . felodipine (PLENDIL) 10 MG 24 hr tablet Take 1 tablet (10 mg total) by mouth daily. 30 tablet 6  . ferrous sulfate 325 (65 FE) MG tablet Take 325 mg by mouth daily with breakfast.    . FLUoxetine (PROZAC) 20 MG capsule Take 20 mg by mouth every morning.     . furosemide (LASIX) 40 MG tablet Take 1 tablet (40 mg total) by mouth daily. 30 tablet   . glipiZIDE (GLUCOTROL XL) 5 MG 24 hr tablet Take 5 mg by mouth daily.  0  . HUMALOG KWIKPEN 100 UNIT/ML KiwkPen INJECT TID BEFORE MEALS PER SLIDING SCALE INSTRUCTIONS AND CALL MD AS INSTRUCTED  11  . Insulin Glargine (LANTUS SOLOSTAR) 100 UNIT/ML SOPN Inject 10 Units into the skin at bedtime.     [name] levothyroxine (SYNTHROID, LEVOTHROID) 125 MCG tablet Take 125 mcg by mouth daily before breakfast.     . losartan (COZAAR) 25 MG tablet Take 25 mg by mouth daily.   0  . memantine (NAMENDA) 10 MG tablet TAKE 1 TABLET  BY MOUTH TWICE A DAY 180 tablet 3  . metoprolol tartrate (LOPRESSOR) 25 MG tablet Take 25 mg by mouth 2 (two) times daily.      . mirabegron ER (MYRBETRIQ) 50 MG TB24 Take 50 mg by mouth every evening.    . Potassium Chloride ER 20 MEQ TBCR TK 1 T PO QD WF  11  . pravastatin (PRAVACHOL) 80 MG tablet Take 1 tablet  (80 mg total) by mouth daily. 90 tablet 2  . QUEtiapine (SEROQUEL) 25 MG tablet TAKE 1 TABLET BY MOUTH AT BEDTIME 30 tablet 9  . rivastigmine (EXELON) 4.6 mg/24hr Place 1 patch (4.6 mg total) onto the skin daily. 30 patch 12  . sennosides-docusate sodium (SENOKOT-S) 8.6-50 MG tablet Take 1 tablet by mouth daily.    . silver sulfADIAZINE (SILVADENE) 1 % cream Apply 1 application topically daily. 50 g 0  . traMADol (ULTRAM) 50 MG tablet Take 1-2 tablets (50-100 mg total) by mouth every 6 (six) hours as needed for moderate pain or severe pain. 10 tablet 0  . XIIDRA 5 % SOLN INSTILL 1 DROP INTO BOTH EYES TWICE A DAY  2   No current facility-administered medications for this visit.     Allergies as of 04/27/2018 - Review Complete 04/27/2018  Allergen Reaction Noted  . Amitriptyline Other (See Comments) 09/27/2013  . Vesicare [solifenacin] Other (See Comments) 09/27/2013  . Vicodin [hydrocodone-acetaminophen] Other (See Comments) 05/09/2011  . Polysaccharide iron complex Hives, Itching, and Rash 05/12/2011    Vitals: BP 118/68   Pulse 85   Ht 5' (1.524 m)   Wt 153 lb (69.4 kg)   BMI 29.88 kg/m    Last Weight:  Wt Readings from Last 1 Encounters:  04/27/18 153 lb (69.4 kg)   Last Height:   Ht Readings from Last 1 Encounters:  04/27/18 5' (1.524 m)    Physical exam:  General: The patient is awake, alert and appears not in acute distress. The patient is pale, but well groomed. She lost weight . Head: Normocephalic, atraumatic.  Cardiovascular:  irregular rate and rhythm Respiratory: Lungs are clear to auscultation. She does not provide deep respirations.  Skin:  Without evidence of edema, or rash-   Neurologic exam : The patient is awake and alert, oriented to place and time.  Memory subjective described as declining , but not rapidly. MMSE 25/ 30 today   There is no longer a normal attention span & concentration ability- but fluent speech .  Speech is fluent without  dysarthria, stronger dysphonia  ( hoarseness ) and no aphasia.  Mood and affect are concerned, she repeats a lot. Cranial nerves: Pupils are equal in seize light. Dry eyes. Status post retinal detachment- Extraocular movements  in vertical and horizontal planes intact and without nystagmus. Hearing to finger rub intact. Facial sensation intact to fine touch. Facial motor strength is symmetric. Motor exam:   normal strength in upper extremities. Good grip strength. Fine motor skills preserved. Her drawing is excellent/   Gait and station: Patient in wheelchair.  Deep tendon reflexes: in the upper and lower extremities are symmetric and intact.  Assessment:    Advancing memory los, inability to multitask, delay in motor responses and poor vision.  The patient is not allowed to drive any longer. Falls at night, denies that this was related to vivid dreams, but stumbled over a rug-REM BD ?  1) Early stage dementia, I lean towards lewy body dementia  MMSE 25-30 , -not longer a "mild  cognitive impairment".   Lewy body or frontal lobe dementia were suspected early , but she no longer has hallucinations of auditory character on Seroquel medication.  Discussed and repeated driving restriction - she should not even drive shopping or in her residential area.  NO DRIVING.  2) Wheelchair prevents falls outdoors and longer distance, but she needs to get back on her feet with walker.  PT is helpful.  Stays on Seroquel  Rv with Np in 6 month to check MMSE.   Larey Seat, MD

## 2018-05-05 ENCOUNTER — Encounter (INDEPENDENT_AMBULATORY_CARE_PROVIDER_SITE_OTHER): Payer: Medicare Other | Admitting: Ophthalmology

## 2018-05-05 DIAGNOSIS — E11319 Type 2 diabetes mellitus with unspecified diabetic retinopathy without macular edema: Secondary | ICD-10-CM

## 2018-05-05 DIAGNOSIS — H43813 Vitreous degeneration, bilateral: Secondary | ICD-10-CM

## 2018-05-05 DIAGNOSIS — E113593 Type 2 diabetes mellitus with proliferative diabetic retinopathy without macular edema, bilateral: Secondary | ICD-10-CM | POA: Diagnosis not present

## 2018-05-05 DIAGNOSIS — H35033 Hypertensive retinopathy, bilateral: Secondary | ICD-10-CM | POA: Diagnosis not present

## 2018-05-05 DIAGNOSIS — I10 Essential (primary) hypertension: Secondary | ICD-10-CM | POA: Diagnosis not present

## 2018-05-10 NOTE — Progress Notes (Signed)
Cardiology Office Note   Date:  05/11/2018   ID:  [name] [name], DOB 14-Dec-1933, MRN [id number]  PCP:  [name] Manes, MD    No chief complaint on file.  CAD/AFib  Wt Readings from Last 3 Encounters:  05/11/18 145 lb (65.8 kg)  04/27/18 153 lb (69.4 kg)  02/26/18 173 lb (78.5 kg)       History of Present Illness: [name] [name] is a 84 y.o. female  who is status post CABG in 04/5276 complicated by postoperative atrial fibrillation, HTN, HL, diabetes, GERD, carotid stenosis, PAD status post angioplasty to the left SFA in 02/2012 for non healing foot ulcer, dementia.  She had a negative stress test in late 2015.  She follows with [name] [name] for carotid disease.  She had an arteriogram showing an occluded left SFA.  She had a covered stent to the left popliteal artery.  No bleeding problems.  She is losing weight as well.  Appetite has decreased.  No leg pain.    Hospitalized for diastolic heart failure in 6/19.  Echo showed EF 60-65%.    Has felt well since leaving the hospital.  Denies : Chest pain. Dizziness. Leg edema. Nitroglycerin use. Orthopnea. Palpitations. Paroxysmal nocturnal dyspnea. Shortness of breath. Syncope.   Past Medical History:  Diagnosis Date  . Abnormality of gait 08/13/2015  . Acute respiratory failure with hypoxia ([place])   . Anemia   . Anxiety   . Arthritis    "right wrist, lower back, maybe my right shoulder" (04/08/2017)  . Atrial fibrillation ([place])   . Bilateral carotid artery stenosis   . Bowel incontinence   . CAD (coronary artery disease)    a. s/p CABG 2012.  . Carotid artery disease ([place])   . Chronic diastolic heart failure ([place]) 02/14/2018  . Chronic lower back pain    "back is very stiff because of fusion; can't stand up straight more than few moments"  . Closed fracture of unspecified part of neck of femur   . Complication of anesthesia    anesth. meds caused hallucinations   . Congestive heart failure ([place])   . Constipation  12/08/2013  . Cough    no fever, chronic  . Degenerative joint disease   . Dementia   . Dementia 12/08/2013  . Depression   . Diabetes mellitus due to underlying condition with diabetic retinopathy 02/12/2016  . Diverticulitis   . DM (diabetes mellitus), type 2 with peripheral vascular complications ([place]) 05/08/2352  . Endometriosis   . Fall at home 05/15/2013  . Gastroparesis due to DM ([place]) 02/12/2016  . GERD (gastroesophageal reflux disease)   . Headache   . Hiatal hernia   . History of blood transfusion   . History of wheezing   . HLD (hyperlipidemia)   . Hx of cardiovascular stress test    Lexiscan Myoview (11/15):  Low risk stress nuclear study with a small, mild fixed apical septal perfusion defect. EF is 52% with mild septal hypokinesis. No ischemia.  [name] Hx of echocardiogram    Echo (11/15):  Mod LVH, severe septal hypertrophy (suggestive HCM), no LVOT obstruction, rest gradient 15 mmHg across AV, vigorous LVF, EF 65-70%, no RWMA, Gr 1 DD, Ao sclerosis (no stenosis), mod LAE, mild RAE, mod TR, PASP 42 mmHg  . Hyperlipemia 12/08/2013  . Hypertension   . Hypertensive heart disease with CHF (congestive heart failure) ([place])   . Hypertonicity of bladder   . Hypertrophic cardiomyopathy ([place])   . Hypothyroidism   .  IDDM (insulin dependent diabetes mellitus) ([place])   . Late onset Alzheimer's disease without behavioral disturbance 02/12/2016  . Lewy body dementia with behavioral disturbance 02/21/2014  . Lumbago   . Moderate vision impairment-both eyes 05/15/2013  . Muscle weakness (generalized)   . Obesity   . Obesity (BMI 30-39.9) 05/15/2013  . Osteoarthritis of right shoulder 10/07/2013  . Osteoarthrosis, unspecified whether generalized or localized, unspecified site   . Other specified transient cerebral ischemias 08/02/2014  . PAD (peripheral artery disease) ([place]) 04/08/2017  . Palliative care encounter   . Persistent atrial fibrillation ([place]) 08/02/2014  . Progressive dementia with  uncertain etiology 04/25/9146   Suspect Lewy body .  [name] Pulmonary hypertension ([place]) 02/14/2018  . Reflux esophagitis   . Rhegmatogenous retinal detachment of right eye 01/10/2013  . [place] post total shoulder arthroplasty 12/15/2013   RIGHT   . Stroke [facility]) 2011   "old very small TIA/neurologist"  . Thyroid disease   . Type II diabetes mellitus ([place])   . Ulcer    left foot x 2 years sees dr Little Ishikawa podiastrist for wound staying same tiny wart appearing areas no drainage changes dressing every 3 days  . Unspecified glaucoma(365.9)   . Urinary incontinence   . Urinary retention 02/14/2018  . Vitamin B 12 deficiency 02/14/2018  . Vitamin D deficiency 02/14/2018    Past Surgical History:  Procedure Laterality Date  . ABDOMINAL AORTOGRAM W/LOWER EXTREMITY N/A 04/08/2017   Procedure: Abdominal Aortogram w/Lower Extremity;  Surgeon: Waynetta Sandy, MD;  Location: [facility];  Service: Cardiovascular;  Laterality: N/A;  . BACK SURGERY     X2 lower  . BOTOX INJECTION N/A 12/02/2016   Procedure: BOTOX INJECTION;  Surgeon: Bjorn Loser, MD;  Location: [facility];  Service: Urology;  Laterality: N/A;  . CARDIAC CATHETERIZATION    . CARPAL TUNNEL RELEASE Bilateral   . CATARACT EXTRACTION W/ INTRAOCULAR LENS  IMPLANT, BILATERAL Bilateral   . CHOLECYSTECTOMY    . CORONARY ARTERY BYPASS GRAFT  04/17/2011   LT  internal mammary artery to left anterior descending, saphenous vein graft first diagonal, saphenous  vein graft to obtuse marginal 1, saphenous vein graft to posterior descending  . CYSTOSCOPY N/A 12/02/2016   Procedure: CYSTOSCOPY;  Surgeon: Bjorn Loser, MD;  Location: [facility];  Service: Urology;  Laterality: N/A;  . DILATION AND CURETTAGE OF UTERUS     "for endometrosis"  . DILATION AND CURETTAGE OF UTERUS     ENDOMETRIOSIS  . ENDARTERECTOMY Left 10/01/2015   Procedure: Left CAROTID Endartarectomy;  Surgeon: Rosetta Posner, MD;  Location: [facility];  Service: Vascular;  Laterality:  Left;  . EYE SURGERY    . HAND SURGERY Right    "for arthritis;"  . JOINT REPLACEMENT    . LASER PHOTO ABLATION Right 01/20/2013   Procedure: LASER PHOTO ABLATION;  Surgeon: Hayden Pedro, MD;  Location: [facility];  Service: Ophthalmology;  Laterality: Right;  . LOWER EXTREMITY ANGIOGRAM N/A 03/10/2012   Procedure: LOWER EXTREMITY ANGIOGRAM;  Surgeon: Jettie Booze, MD;  Location: [facility];  Service: Cardiovascular;  Laterality: N/A;  . NEUROPLASTY / TRANSPOSITION MEDIAN NERVE AT CARPAL TUNNEL BILATERAL    . PARTIAL HIP ARTHROPLASTY Left 11/2009   "just the ball"  . PERIPHERAL VASCULAR CATHETERIZATION N/A 05/16/2015   Procedure: Abdominal Aortogram;  Surgeon: Elam Dutch, MD;  Location: [facility];  Service: Cardiovascular;  Laterality: N/A;  . PERIPHERAL VASCULAR CATHETERIZATION Left 05/16/2015   Procedure: Lower  Extremity Angiography;  Surgeon: Elam Dutch, MD;  Location: [facility];  Service: Cardiovascular;  Laterality: Left;  . PERIPHERAL VASCULAR INTERVENTION  04/08/2017   Procedure: Peripheral Vascular Intervention;  Surgeon: Waynetta Sandy, MD;  Location: [facility];  Service: Cardiovascular;;  lt.Popliteal  . POSTERIOR FUSION LUMBAR SPINE     "removed L4-5, S1"  . PTCA  03/10/12   LLE  . RETINAL DETACHMENT SURGERY Right May 2014  . SCLERAL BUCKLE Right 01/20/2013   Procedure: SCLERAL BUCKLE;  Surgeon: Hayden Pedro, MD;  Location: [facility];  Service: Ophthalmology;  Laterality: Right;  . TOTAL KNEE ARTHROPLASTY  ~ 2004   right  . TOTAL SHOULDER ARTHROPLASTY Right 10/07/2013   Procedure: TOTAL SHOULDER ARTHROPLASTY;  Surgeon: Alta Corning, MD;  Location: [facility];  Service: Orthopedics;  Laterality: Right;     Current Outpatient Medications  Medication Sig Dispense Refill  . acetaminophen (TYLENOL) 650 MG CR tablet Take 650 mg by mouth every 8 (eight) hours as needed for pain.    [name] alendronate (FOSAMAX) 70 MG tablet TK 1 T PO ONCE A  WEEK  11  . apixaban (ELIQUIS) 5 MG TABS tablet Take 1 tablet (5 mg total) by mouth 2 (two) times daily. 60 tablet 11  . Ascorbic Acid (VITAMIN C) 1000 MG tablet Take 1,000 mg by mouth 3 (three) times a week.     . cholecalciferol (VITAMIN D) 1000 UNITS tablet Take 1,000 Units by mouth See admin instructions. On Monday, Tuesday, Wednesday, Friday, Saturday.    . COMBIGAN 0.2-0.5 % ophthalmic solution Place 1 drop into both eyes 2 (two) times daily.   0  . Cyanocobalamin (VITAMIN B 12) 100 MCG LOZG Take 1 tablet by mouth daily.    . ergocalciferol (VITAMIN D2) 50000 UNITS capsule Take 50,000 Units by mouth 2 (two) times a week. On Thursday and Sunday    . felodipine (PLENDIL) 10 MG 24 hr tablet Take 1 tablet (10 mg total) by mouth daily. 30 tablet 6  . ferrous sulfate 325 (65 FE) MG tablet Take 325 mg by mouth daily with breakfast.    . FLUoxetine (PROZAC) 20 MG capsule Take 20 mg by mouth every morning.     . furosemide (LASIX) 40 MG tablet Take 1 tablet (40 mg total) by mouth daily. 30 tablet   . glipiZIDE (GLUCOTROL XL) 5 MG 24 hr tablet Take 5 mg by mouth daily.  0  . HUMALOG KWIKPEN 100 UNIT/ML KiwkPen INJECT TID BEFORE MEALS PER SLIDING SCALE INSTRUCTIONS AND CALL MD AS INSTRUCTED  11  . Insulin Glargine (LANTUS SOLOSTAR) 100 UNIT/ML SOPN Inject 10 Units into the skin at bedtime.     [name] levothyroxine (SYNTHROID, LEVOTHROID) 125 MCG tablet Take 125 mcg by mouth daily before breakfast.     . losartan (COZAAR) 25 MG tablet Take 25 mg by mouth daily.   0  . memantine (NAMENDA) 10 MG tablet TAKE 1 TABLET BY MOUTH TWICE A DAY 180 tablet 3  . metoprolol tartrate (LOPRESSOR) 25 MG tablet Take 25 mg by mouth 2 (two) times daily.      . mirabegron ER (MYRBETRIQ) 50 MG TB24 Take 50 mg by mouth every evening.    . Potassium Chloride ER 20 MEQ TBCR TK 1 T PO QD WF  11  . pravastatin (PRAVACHOL) 80 MG tablet Take 1 tablet (80 mg total) by mouth daily. 90 tablet 2  . QUEtiapine (SEROQUEL) 25 MG tablet TAKE  1  TABLET BY MOUTH AT BEDTIME 30 tablet 9  . rivastigmine (EXELON) 4.6 mg/24hr Place 1 patch (4.6 mg total) onto the skin daily. 30 patch 12  . sennosides-docusate sodium (SENOKOT-S) 8.6-50 MG tablet Take 1 tablet by mouth daily.    . silver sulfADIAZINE (SILVADENE) 1 % cream Apply 1 application topically daily. 50 g 0  . traMADol (ULTRAM) 50 MG tablet Take 1-2 tablets (50-100 mg total) by mouth every 6 (six) hours as needed for moderate pain or severe pain. 10 tablet 0  . XIIDRA 5 % SOLN INSTILL 1 DROP INTO BOTH EYES TWICE A DAY  2   No current facility-administered medications for this visit.     Allergies:   Amitriptyline; Vesicare [solifenacin]; Vicodin [hydrocodone-acetaminophen]; and Polysaccharide iron complex    Social History:  The patient  reports that she quit smoking about 30 years ago. Her smoking use included cigarettes. She has a 57.00 pack-year smoking history. She has never used smokeless tobacco. She reports that she does not drink alcohol or use drugs.   Family History:  The patient's family history includes Cancer in her cousin and paternal aunt; Heart attack in her father and mother; Heart disease in her father; Hyperlipidemia in her father; Hypertension in her father and mother.    ROS:  Please see the history of present illness.   Otherwise, review of systems are positive for weight loss.   All other systems are reviewed and negative.    PHYSICAL EXAM: VS:  BP (!) 158/82   Pulse (!) 107   Ht 5' (1.524 m)   Wt 145 lb (65.8 kg)   SpO2 99%   BMI 28.32 kg/m  , BMI Body mass index is 28.32 kg/m. GEN: Well nourished, well developed, in no acute distress  HEENT: normal  Neck: no JVD, carotid bruits, or masses Cardiac: irregularly irreglar; no murmurs, rubs, or gallops,no edema  Respiratory:  clear to auscultation bilaterally, normal work of breathing GI: soft, nontender, nondistended, + BS MS: no deformity or atrophy  Skin: warm and dry, no rash Neuro:  Strength  and sensation are intact Psych: euthymic mood, full affect    Recent Labs: 02/14/2018: B Natriuretic Peptide 591.4; Magnesium 1.8; TSH 1.289 02/17/2018: ALT 15 02/22/2018: BUN 30; Creatinine, Ser 1.19; Hemoglobin 10.0; Platelets 284; Potassium 4.6; Sodium 141   Lipid Panel    Component Value Date/Time   CHOL 157 06/26/2014 1132   TRIG 250.0 (H) 06/26/2014 1132   HDL 30.80 (L) 06/26/2014 1132   CHOLHDL 5 06/26/2014 1132   VLDL 50.0 (H) 06/26/2014 1132   LDLCALC 123 (H) 03/20/2014 1107   LDLDIRECT 73.3 06/26/2014 1132     Other studies Reviewed: Additional studies/ records that were reviewed today with results demonstrating: labs reviewed; LDL 48 in 5/19.   ASSESSMENT AND PLAN:  1. CAD: No angina on medical therapy.  Continue aggressive medical therapy.   2. AFib: Rate control.  Eliquis for stroke prevention.  Diastolic heart faliure- avoid salt in the diet.  She had been eating out a lot but now has a caretaker who lives with her and cooks.  SHe is cutting back on salt.   3. PAD: Followed by [facility].  4. Hypertensive heart disease: BP borderline control.  Lower on 04/27/18 5. Hyperlipidemia: LDL 48 in 5/19 6. DM: Followed by PMD. Eat healthy but try to avoid too much more weigh tloss.    Current medicines are reviewed at length with the patient today.  The patient concerns regarding her  medicines were addressed.  The following changes have been made:  No change  Labs/ tests ordered today include:  No orders of the defined types were placed in this encounter.   Recommend 150 minutes/week of aerobic exercise Low fat, low carb, high fiber diet recommended  Disposition:   FU in 6 months with an APP   Signed, Larae Grooms, MD  05/11/2018 11:53 AM    [facility] [address], [place], [place]  [address] Phone: [phone]; Fax: [phone]

## 2018-05-11 ENCOUNTER — Encounter: Payer: Self-pay | Admitting: Interventional Cardiology

## 2018-05-11 ENCOUNTER — Ambulatory Visit: Payer: Medicare Other | Admitting: Interventional Cardiology

## 2018-05-11 VITALS — BP 158/82 | HR 107 | Ht 60.0 in | Wt 145.0 lb

## 2018-05-11 DIAGNOSIS — I5032 Chronic diastolic (congestive) heart failure: Secondary | ICD-10-CM

## 2018-05-11 DIAGNOSIS — I739 Peripheral vascular disease, unspecified: Secondary | ICD-10-CM

## 2018-05-11 DIAGNOSIS — E782 Mixed hyperlipidemia: Secondary | ICD-10-CM

## 2018-05-11 DIAGNOSIS — E1159 Type 2 diabetes mellitus with other circulatory complications: Secondary | ICD-10-CM

## 2018-05-11 DIAGNOSIS — I110 Hypertensive heart disease with heart failure: Secondary | ICD-10-CM | POA: Diagnosis not present

## 2018-05-11 DIAGNOSIS — I2510 Atherosclerotic heart disease of native coronary artery without angina pectoris: Secondary | ICD-10-CM | POA: Diagnosis not present

## 2018-05-11 DIAGNOSIS — I4819 Other persistent atrial fibrillation: Secondary | ICD-10-CM

## 2018-05-11 DIAGNOSIS — I481 Persistent atrial fibrillation: Secondary | ICD-10-CM | POA: Diagnosis not present

## 2018-05-11 NOTE — Patient Instructions (Addendum)
Medication Instructions:  Your physician recommends that you continue on your current medications as directed. Please refer to the Current Medication list given to you today.   Labwork: Your physician recommends that you return for lab work in: December for BMET and CBC   Testing/Procedures: None ordered  Follow-Up: Your physician wants you to follow-up in: 6 months with Dr. Irish Lack or APP on his team. You will receive a reminder letter in the mail two months in advance. If you don't receive a letter, please call our office to schedule the follow-up appointment.   Any Other Special Instructions Will Be Listed Below (If Applicable).     If you need a refill on your cardiac medications before your next appointment, please call your pharmacy.

## 2018-05-12 ENCOUNTER — Encounter: Payer: Medicare Other | Admitting: Neurology

## 2018-05-12 ENCOUNTER — Encounter: Payer: Self-pay | Admitting: Podiatry

## 2018-05-12 ENCOUNTER — Ambulatory Visit (INDEPENDENT_AMBULATORY_CARE_PROVIDER_SITE_OTHER): Payer: Medicare Other | Admitting: Podiatry

## 2018-05-12 VITALS — BP 126/68 | HR 91

## 2018-05-12 DIAGNOSIS — L600 Ingrowing nail: Secondary | ICD-10-CM | POA: Diagnosis not present

## 2018-05-12 DIAGNOSIS — L03031 Cellulitis of right toe: Secondary | ICD-10-CM | POA: Diagnosis not present

## 2018-05-12 DIAGNOSIS — L84 Corns and callosities: Secondary | ICD-10-CM | POA: Diagnosis not present

## 2018-05-12 NOTE — Patient Instructions (Signed)

## 2018-05-13 ENCOUNTER — Telehealth: Payer: Self-pay

## 2018-05-13 DIAGNOSIS — F0280 Dementia in other diseases classified elsewhere without behavioral disturbance: Secondary | ICD-10-CM

## 2018-05-13 DIAGNOSIS — G301 Alzheimer's disease with late onset: Principal | ICD-10-CM

## 2018-05-13 NOTE — Telephone Encounter (Signed)
Patients caregiver picked up home study device and helped patient put on that night. Patient took probe off and there was no data. Caregiver stated she would not wear it if we tried again.

## 2018-05-14 NOTE — Assessment & Plan Note (Signed)
Patient has symptoms of both: Lewy body and of  Azheimer's dementia. Hallucinations used to be much more dominant. Sundowning, calls to the police, etc. Over the last 2 years more of a mixed demntia pattern, slowly progressive.

## 2018-05-14 NOTE — Progress Notes (Signed)
Subjective:   Patient ID: [name] [name], female   DOB: 84 y.o.   MRN: [id number]   HPI Patient presents in very poor health with caregiver with a incurvated bleeding right hallux medial border that has drainage with patient having wounds on her ankles that are being treated by other doctors   Review of Systems  All other systems reviewed and are negative.       Objective:  Physical Exam  Constitutional: She appears well-developed and well-nourished.  Cardiovascular: Intact distal pulses.  Pulmonary/Chest: Effort normal.  Musculoskeletal: Normal range of motion.  Neurological: She is alert.  Skin: Skin is warm.  Nursing note and vitals reviewed.   Neurovascular status diminished with diminished sharp dull vibratory and diminished pulses PT DP block bilateral.  Patient's right hallux medial border is incurvated and there is distal redness and necrotic tissue formation localized in nature     Assessment:  Paronychia infection right hallux     Plan:  Reviewed condition explained to caregiver difficulty of this and today I did infiltrate the right hallux 60 g like Marcaine mixture using sterile his mentation remove the medial border necrotic tissue and applied dressing to provide sterile element for this.  This can be difficult to [place] will use local wound care for this toe and patient will be seen back as needed

## 2018-05-14 NOTE — Telephone Encounter (Signed)
Watch pat ?  Well, we can't invite her into the lab, she would need a sitter and has still some visual hallucinations, sundowning. The change in night time surrounding will likely contribute to even more confusion. Given her heart history she may benefit from a PCP evaluation for possible need of nocturnal oxygen - Our lab couldn't prescribe oxygen without CPAP.   We will leave it be- Larey Seat, MD

## 2018-05-18 ENCOUNTER — Other Ambulatory Visit: Payer: Self-pay | Admitting: Neurology

## 2018-05-18 DIAGNOSIS — F0280 Dementia in other diseases classified elsewhere without behavioral disturbance: Secondary | ICD-10-CM

## 2018-05-18 DIAGNOSIS — G3183 Dementia with Lewy bodies: Principal | ICD-10-CM

## 2018-05-18 NOTE — Telephone Encounter (Signed)
Yes a watchpat was used for her homestudy.

## 2018-05-31 ENCOUNTER — Other Ambulatory Visit: Payer: Self-pay | Admitting: Neurology

## 2018-05-31 ENCOUNTER — Telehealth: Payer: Self-pay | Admitting: Neurology

## 2018-05-31 DIAGNOSIS — I5033 Acute on chronic diastolic (congestive) heart failure: Principal | ICD-10-CM

## 2018-05-31 DIAGNOSIS — I2510 Atherosclerotic heart disease of native coronary artery without angina pectoris: Secondary | ICD-10-CM

## 2018-05-31 DIAGNOSIS — R413 Other amnesia: Secondary | ICD-10-CM

## 2018-05-31 DIAGNOSIS — G3183 Dementia with Lewy bodies: Secondary | ICD-10-CM

## 2018-05-31 DIAGNOSIS — F0280 Dementia in other diseases classified elsewhere without behavioral disturbance: Secondary | ICD-10-CM

## 2018-05-31 DIAGNOSIS — G4734 Idiopathic sleep related nonobstructive alveolar hypoventilation: Secondary | ICD-10-CM

## 2018-05-31 NOTE — Telephone Encounter (Signed)
She would need a sitter or someone in the room with her. Can that be arranged. ? CD

## 2018-05-31 NOTE — Telephone Encounter (Signed)
pt's daughter [name]/DPR is requesting home sleep study results from August. She also said there is has been a big change over the past few weeks. She is wanting to sleep all the time, increased confusion, not understanding what is being said to her. Please call to advise

## 2018-05-31 NOTE — Telephone Encounter (Signed)
Called the patients daughter and she was just advising how much she is changed. She states that she is doing odd things. For the first time ever she didn't recognize her daughter. She states that she is sleeping better and more then normal. The daughter just wants to know if some of these changes could be from the fact that her oxygen level is dipping down and we aren't able to treat her or if this is new normal. The daughter is questioning if we could attempt once more with HST and see if we can get a reading.  I informed her that it looked like Dr Brett Fairy had mentioned with the amount of sundowning it would make it be hard to do in sleep lab and the daughter agrees but she has just notices these changes and with her memory and the odd behaviors that are becoming more present she worries not try to assess this once more if its something that we can do. I informed the daughter I would ask Dr Brett Fairy and the sleep lab and see what their thoughts and recommendations were. I also informed the patient if not then she could attempt calling the patinet and discuss with them about potentially ordering oxygen on her. Patients daughter verbalized understanding and was appreciative of the call back.

## 2018-06-01 ENCOUNTER — Ambulatory Visit (HOSPITAL_COMMUNITY)
Admission: RE | Admit: 2018-06-01 | Discharge: 2018-06-01 | Disposition: A | Discharge status: Home / Self Care | Payer: Medicare Other | Source: Ambulatory Visit | Attending: Internal Medicine | Admitting: Internal Medicine

## 2018-06-01 ENCOUNTER — Encounter (HOSPITAL_BASED_OUTPATIENT_CLINIC_OR_DEPARTMENT_OTHER): Payer: Medicare Other | Attending: Internal Medicine

## 2018-06-01 ENCOUNTER — Other Ambulatory Visit (HOSPITAL_BASED_OUTPATIENT_CLINIC_OR_DEPARTMENT_OTHER): Payer: Self-pay | Admitting: Internal Medicine

## 2018-06-01 ENCOUNTER — Other Ambulatory Visit (HOSPITAL_COMMUNITY)
Admission: RE | Admit: 2018-06-01 | Discharge: 2018-06-01 | Disposition: A | Discharge status: Home / Self Care | Payer: Medicare Other | Attending: Internal Medicine | Admitting: Internal Medicine

## 2018-06-01 ENCOUNTER — Other Ambulatory Visit: Payer: Self-pay | Admitting: *Deleted

## 2018-06-01 DIAGNOSIS — M19071 Primary osteoarthritis, right ankle and foot: Secondary | ICD-10-CM | POA: Insufficient documentation

## 2018-06-01 DIAGNOSIS — L97419 Non-pressure chronic ulcer of right heel and midfoot with unspecified severity: Secondary | ICD-10-CM | POA: Insufficient documentation

## 2018-06-01 DIAGNOSIS — M85871 Other specified disorders of bone density and structure, right ankle and foot: Secondary | ICD-10-CM | POA: Insufficient documentation

## 2018-06-01 DIAGNOSIS — F0390 Unspecified dementia without behavioral disturbance: Secondary | ICD-10-CM | POA: Insufficient documentation

## 2018-06-01 DIAGNOSIS — Z794 Long term (current) use of insulin: Secondary | ICD-10-CM | POA: Insufficient documentation

## 2018-06-01 DIAGNOSIS — L97812 Non-pressure chronic ulcer of other part of right lower leg with fat layer exposed: Secondary | ICD-10-CM | POA: Diagnosis not present

## 2018-06-01 DIAGNOSIS — I2510 Atherosclerotic heart disease of native coronary artery without angina pectoris: Secondary | ICD-10-CM | POA: Insufficient documentation

## 2018-06-01 DIAGNOSIS — R52 Pain, unspecified: Secondary | ICD-10-CM

## 2018-06-01 DIAGNOSIS — E11621 Type 2 diabetes mellitus with foot ulcer: Secondary | ICD-10-CM | POA: Insufficient documentation

## 2018-06-01 DIAGNOSIS — L97522 Non-pressure chronic ulcer of other part of left foot with fat layer exposed: Secondary | ICD-10-CM | POA: Insufficient documentation

## 2018-06-01 DIAGNOSIS — L97418 Non-pressure chronic ulcer of right heel and midfoot with other specified severity: Secondary | ICD-10-CM | POA: Diagnosis present

## 2018-06-01 DIAGNOSIS — E1142 Type 2 diabetes mellitus with diabetic polyneuropathy: Secondary | ICD-10-CM | POA: Insufficient documentation

## 2018-06-01 DIAGNOSIS — E1151 Type 2 diabetes mellitus with diabetic peripheral angiopathy without gangrene: Secondary | ICD-10-CM | POA: Diagnosis not present

## 2018-06-01 DIAGNOSIS — L97512 Non-pressure chronic ulcer of other part of right foot with fat layer exposed: Secondary | ICD-10-CM | POA: Insufficient documentation

## 2018-06-01 DIAGNOSIS — I10 Essential (primary) hypertension: Secondary | ICD-10-CM | POA: Diagnosis not present

## 2018-06-01 DIAGNOSIS — Z87891 Personal history of nicotine dependence: Secondary | ICD-10-CM | POA: Insufficient documentation

## 2018-06-01 MED ORDER — APIXABAN 5 MG PO TABS: 5.0000 mg | ORAL_TABLET | Freq: Two times a day (BID) | ORAL | 11 refills | Status: AC

## 2018-06-04 LAB — AEROBIC CULTURE  (SUPERFICIAL SPECIMEN)

## 2018-06-04 LAB — AEROBIC CULTURE W GRAM STAIN (SUPERFICIAL SPECIMEN)

## 2018-06-10 DIAGNOSIS — E11621 Type 2 diabetes mellitus with foot ulcer: Secondary | ICD-10-CM | POA: Diagnosis not present

## 2018-06-17 ENCOUNTER — Encounter (HOSPITAL_BASED_OUTPATIENT_CLINIC_OR_DEPARTMENT_OTHER): Payer: Medicare Other | Attending: Internal Medicine

## 2018-06-17 DIAGNOSIS — L97812 Non-pressure chronic ulcer of other part of right lower leg with fat layer exposed: Secondary | ICD-10-CM | POA: Diagnosis not present

## 2018-06-17 DIAGNOSIS — E1142 Type 2 diabetes mellitus with diabetic polyneuropathy: Secondary | ICD-10-CM | POA: Diagnosis not present

## 2018-06-17 DIAGNOSIS — I10 Essential (primary) hypertension: Secondary | ICD-10-CM | POA: Diagnosis not present

## 2018-06-17 DIAGNOSIS — B952 Enterococcus as the cause of diseases classified elsewhere: Secondary | ICD-10-CM | POA: Insufficient documentation

## 2018-06-17 DIAGNOSIS — L89622 Pressure ulcer of left heel, stage 2: Secondary | ICD-10-CM | POA: Insufficient documentation

## 2018-06-17 DIAGNOSIS — B9689 Other specified bacterial agents as the cause of diseases classified elsewhere: Secondary | ICD-10-CM | POA: Insufficient documentation

## 2018-06-17 DIAGNOSIS — E1136 Type 2 diabetes mellitus with diabetic cataract: Secondary | ICD-10-CM | POA: Insufficient documentation

## 2018-06-17 DIAGNOSIS — E11622 Type 2 diabetes mellitus with other skin ulcer: Secondary | ICD-10-CM | POA: Insufficient documentation

## 2018-06-17 DIAGNOSIS — F0390 Unspecified dementia without behavioral disturbance: Secondary | ICD-10-CM | POA: Insufficient documentation

## 2018-06-17 DIAGNOSIS — E1151 Type 2 diabetes mellitus with diabetic peripheral angiopathy without gangrene: Secondary | ICD-10-CM | POA: Diagnosis not present

## 2018-06-17 DIAGNOSIS — L89612 Pressure ulcer of right heel, stage 2: Secondary | ICD-10-CM | POA: Insufficient documentation

## 2018-06-17 DIAGNOSIS — I2510 Atherosclerotic heart disease of native coronary artery without angina pectoris: Secondary | ICD-10-CM | POA: Diagnosis not present

## 2018-06-18 ENCOUNTER — Encounter: Payer: Self-pay | Admitting: *Deleted

## 2018-06-18 ENCOUNTER — Ambulatory Visit (INDEPENDENT_AMBULATORY_CARE_PROVIDER_SITE_OTHER): Payer: Medicare Other | Admitting: Family

## 2018-06-18 ENCOUNTER — Encounter: Payer: Self-pay | Admitting: Neurology

## 2018-06-18 VITALS — BP 112/70 | HR 93 | Temp 98.5°F | Resp 16 | Ht 61.5 in | Wt 143.0 lb

## 2018-06-18 DIAGNOSIS — Z87891 Personal history of nicotine dependence: Secondary | ICD-10-CM | POA: Diagnosis not present

## 2018-06-18 DIAGNOSIS — I2510 Atherosclerotic heart disease of native coronary artery without angina pectoris: Secondary | ICD-10-CM

## 2018-06-18 DIAGNOSIS — I779 Disorder of arteries and arterioles, unspecified: Secondary | ICD-10-CM | POA: Diagnosis not present

## 2018-06-18 DIAGNOSIS — F0280 Dementia in other diseases classified elsewhere without behavioral disturbance: Secondary | ICD-10-CM

## 2018-06-18 DIAGNOSIS — I5033 Acute on chronic diastolic (congestive) heart failure: Secondary | ICD-10-CM

## 2018-06-18 DIAGNOSIS — I6523 Occlusion and stenosis of bilateral carotid arteries: Secondary | ICD-10-CM

## 2018-06-18 DIAGNOSIS — G3183 Dementia with Lewy bodies: Secondary | ICD-10-CM

## 2018-06-18 DIAGNOSIS — Z9889 Other specified postprocedural states: Secondary | ICD-10-CM | POA: Diagnosis not present

## 2018-06-18 DIAGNOSIS — I255 Ischemic cardiomyopathy: Secondary | ICD-10-CM

## 2018-06-18 DIAGNOSIS — I480 Paroxysmal atrial fibrillation: Secondary | ICD-10-CM

## 2018-06-18 NOTE — Patient Instructions (Signed)
Stroke Prevention Some health problems and behaviors may make it more likely for you to have a stroke. Below are ways to lessen your risk of having a stroke.  Be active for at least 30 minutes on most or all days.  Do not smoke. Try not to be around others who smoke.  Do not drink too much alcohol. ? Do not have more than 2 drinks a day if you are a man. ? Do not have more than 1 drink a day if you are a woman and are not pregnant.  Eat healthy foods, such as fruits and vegetables. If you were put on a specific diet, follow the diet as told.  Keep your cholesterol levels under control through diet and medicines. Look for foods that are low in saturated fat, trans fat, cholesterol, and are high in fiber.  If you have diabetes, follow all diet plans and take your medicine as told.  Ask your doctor if you need treatment to lower your blood pressure. If you have high blood pressure (hypertension), follow all diet plans and take your medicine as told by your doctor.  If you are 18-39 years old, have your blood pressure checked every 3-5 years. If you are age 40 or older, have your blood pressure checked every year.  Keep a healthy weight. Eat foods that are low in calories, salt, saturated fat, trans fat, and cholesterol.  Do not take drugs.  Avoid birth control pills, if this applies. Talk to your doctor about the risks of taking birth control pills.  Talk to your doctor if you have sleep problems (sleep apnea).  Take all medicine as told by your doctor. ? You may be told to take aspirin or blood thinner medicine. Take this medicine as told by your doctor. ? Understand your medicine instructions.  Make sure any other conditions you have are being taken care of.  Get help right away if:  You suddenly lose feeling (you feel numb) or have weakness in your face, arm, or leg.  Your face or eyelid hangs down to one side.  You suddenly feel confused.  You have trouble talking  (aphasia) or understanding what people are saying.  You suddenly have trouble seeing in one or both eyes.  You suddenly have trouble walking.  You are dizzy.  You lose your balance or your movements are clumsy (uncoordinated).  You suddenly have a very bad headache and you do not know the cause.  You have new chest pain.  Your heart feels like it is fluttering or skipping a beat (irregular heartbeat). Do not wait to see if the symptoms above go away. Get help right away. Call your local emergency services (911 in [place] Do not drive yourself to the hospital. This information is not intended to replace advice given to you by your health care provider. Make sure you discuss any questions you have with your health care provider. Document Released: 03/02/2012 Document Revised: 02/07/2016 Document Reviewed: 03/04/2013 Elsevier Interactive Patient Education  2018 Elsevier Inc.     Peripheral Vascular Disease Peripheral vascular disease (PVD) is a disease of the blood vessels that are not part of your heart and brain. A simple term for PVD is poor circulation. In most cases, PVD narrows the blood vessels that carry blood from your heart to the rest of your body. This can result in a decreased supply of blood to your arms, legs, and internal organs, like your stomach or kidneys. However, it most often affects   a person's lower legs and feet. There are two types of PVD.  Organic PVD. This is the more common type. It is caused by damage to the structure of blood vessels.  Functional PVD. This is caused by conditions that make blood vessels contract and tighten (spasm).  Without treatment, PVD tends to get worse over time. PVD can also lead to acute ischemic limb. This is when an arm or limb suddenly has trouble getting enough blood. This is a medical emergency. Follow these instructions at home:  Take medicines only as told by your doctor.  Do not use any tobacco products, including  cigarettes, chewing tobacco, or electronic cigarettes. If you need help quitting, ask your doctor.  Lose weight if you are overweight, and maintain a healthy weight as told by your doctor.  Eat a diet that is low in fat and cholesterol. If you need help, ask your doctor.  Exercise regularly. Ask your doctor for some good activities for you.  Take good care of your feet. ? Wear comfortable shoes that fit well. ? Check your feet often for any cuts or sores. Contact a doctor if:  You have cramps in your legs while walking.  You have leg pain when you are at rest.  You have coldness in a leg or foot.  Your skin changes.  You are unable to get or have an erection (erectile dysfunction).  You have cuts or sores on your feet that are not healing. Get help right away if:  Your arm or leg turns cold and blue.  Your arms or legs become red, warm, swollen, painful, or numb.  You have chest pain or trouble breathing.  You suddenly have weakness in your face, arm, or leg.  You become very confused or you cannot speak.  You suddenly have a very bad headache.  You suddenly cannot see. This information is not intended to replace advice given to you by your health care provider. Make sure you discuss any questions you have with your health care provider. Document Released: 11/26/2009 Document Revised: 02/07/2016 Document Reviewed: 02/09/2014 Elsevier Interactive Patient Education  2017 Elsevier Inc.  

## 2018-06-18 NOTE — Telephone Encounter (Signed)
Pts daughter [name](on DPR) requesting a call regarding the pt redoing a sleep study, please advise

## 2018-06-18 NOTE — Progress Notes (Signed)
[facility] [place] HISTORY AND PHYSICAL   CC: Referred by Dr. Dellia Nims for evaluation of the arterial perfusion status of her lower extremities since bilateral heel ulcers noted in July 2019 after rehab in a nursing facility; pt with history of PAOD   History of Present Illness:   [name] [name] is a 84 y.o. female whom Dr. Donnetta Hutching had seen her in the office with progressive nonhealing ulceration over the lateral aspect of her left foot; this has healed.  She has had prior vein harvest bilaterally for coronary artery bypass grafting.  She underwent arteriography with Dr. Donzetta Matters on 04/08/2017 and was found to have a left popliteal occlusion. She underwent a covered stenting of this.  She is also s/p left CEA on 10-01-15 by Dr. Donnetta Hutching.   Dr. Donnetta Hutching last evaluated pt on 06-02-17. At that time excellent result from her left popliteal stenting. Will continue her usual activities. We will see her in 6 months with duplex follow-up.   She was admitted to [facility] via the ED on 02-14-18 for exacerbation of CHF. She then had rehab at [facility].  She has documented dementia, her neurologist is Dr. Brett Fairy.  Dr. Irish Lack is her cardiologist.   Pt states she walks with a walker.   Pt denies ever having a stroke, but her past medical history documents stroke in 2011. She is oriented x 3.   Pt's 24/7 caregiver with pt states pt was admitted to [facility] in July 2019 after hospitalized at [facility] for apparent exacerbation of CHF.  When pt came home the last week in July her caregiver noticed large black areas on her heels, about 2 inches in diameter. She then got connected with wound care at [facility].  Dr. Dellia Nims placed her on Cipro and Doxycycline. On 06-14-18.  He sent her her here for vascular status evaluation.  [facility] wound care center is also treating a wound on her right shin which is improving according to caregiver, pt does not know how this wound came about.     Her last serum  creatinine was 1.19 on 02-22-18.    Diabetic: Yes, A1C was 5.7 on 02-14-18 Tobacco use: former smoker, quit in 1989, smoked x 38 years   Pt meds include: Statin :Yes ASA: No Other anticoagulants/antiplatelets: Eliquis, hx of atrial fib    Current Outpatient Medications  Medication Sig Dispense Refill  . acetaminophen (TYLENOL) 650 MG CR tablet Take 650 mg by mouth every 8 (eight) hours as needed for pain.    [name] alendronate (FOSAMAX) 70 MG tablet TK 1 T PO ONCE A WEEK  11  . apixaban (ELIQUIS) 5 MG TABS tablet Take 1 tablet (5 mg total) by mouth 2 (two) times daily. 60 tablet 11  . Ascorbic Acid (VITAMIN C) 1000 MG tablet Take 1,000 mg by mouth 3 (three) times a week.     . cholecalciferol (VITAMIN D) 1000 UNITS tablet Take 1,000 Units by mouth See admin instructions. On Monday, Tuesday, Wednesday, Friday, Saturday.    . ciprofloxacin (CIPRO) 500 MG tablet TK 1 T PO  BID FOR A FURTHER 3 DAYS  0  . COMBIGAN 0.2-0.5 % ophthalmic solution Place 1 drop into both eyes 2 (two) times daily.   0  . Cyanocobalamin (VITAMIN B 12) 100 MCG LOZG Take 1 tablet by mouth daily.    [name] doxycycline (MONODOX) 100 MG capsule TK 1 C PO BID FOR 3 ADDITIONAL DAYS  0  . ergocalciferol (VITAMIN D2) 50000  UNITS capsule Take 50,000 Units by mouth 2 (two) times a week. On Thursday and Sunday    . felodipine (PLENDIL) 10 MG 24 hr tablet Take 1 tablet (10 mg total) by mouth daily. 30 tablet 6  . ferrous sulfate 325 (65 FE) MG tablet Take 325 mg by mouth daily with breakfast.    . FLUoxetine (PROZAC) 20 MG capsule Take 20 mg by mouth every morning.     . furosemide (LASIX) 40 MG tablet Take 1 tablet (40 mg total) by mouth daily. 30 tablet   . glipiZIDE (GLUCOTROL XL) 5 MG 24 hr tablet Take 5 mg by mouth daily.  0  . HUMALOG KWIKPEN 100 UNIT/ML KiwkPen INJECT TID BEFORE MEALS PER SLIDING SCALE INSTRUCTIONS AND CALL MD AS INSTRUCTED  11  . Insulin Glargine (LANTUS SOLOSTAR) 100 UNIT/ML SOPN Inject 10 Units into the skin at  bedtime.     [name] levothyroxine (SYNTHROID, LEVOTHROID) 125 MCG tablet Take 125 mcg by mouth daily before breakfast.     . losartan (COZAAR) 25 MG tablet Take 25 mg by mouth daily.   0  . memantine (NAMENDA) 10 MG tablet TAKE 1 TABLET BY MOUTH TWICE A DAY 180 tablet 3  . metoprolol tartrate (LOPRESSOR) 25 MG tablet Take 25 mg by mouth 2 (two) times daily.      . mirabegron ER (MYRBETRIQ) 50 MG TB24 Take 50 mg by mouth every evening.    . Potassium Chloride ER 20 MEQ TBCR TK 1 T PO QD WF  11  . pravastatin (PRAVACHOL) 80 MG tablet Take 1 tablet (80 mg total) by mouth daily. 90 tablet 2  . QUEtiapine (SEROQUEL) 25 MG tablet TAKE 1 TABLET BY MOUTH AT BEDTIME 30 tablet 9  . rivastigmine (EXELON) 4.6 mg/24hr APPLY 1 PATCH ONTO THE SKIN ONCE DAILY 30 patch 0  . SANTYL ointment APPLY TOPICALLY TO WOUND AND CHANGE DAILY  1  . sennosides-docusate sodium (SENOKOT-S) 8.6-50 MG tablet Take 1 tablet by mouth daily.    . silver sulfADIAZINE (SILVADENE) 1 % cream Apply 1 application topically daily. 50 g 0  . traMADol (ULTRAM) 50 MG tablet Take 1-2 tablets (50-100 mg total) by mouth every 6 (six) hours as needed for moderate pain or severe pain. 10 tablet 0  . XIIDRA 5 % SOLN INSTILL 1 DROP INTO BOTH EYES TWICE A DAY  2   No current facility-administered medications for this visit.     Past Medical History:  Diagnosis Date  . Abnormality of gait 08/13/2015  . Acute respiratory failure with hypoxia ([place])   . Anemia   . Anxiety   . Arthritis    "right wrist, lower back, maybe my right shoulder" (04/08/2017)  . Atrial fibrillation ([place])   . Bilateral carotid artery stenosis   . Bowel incontinence   . CAD (coronary artery disease)    a. s/p CABG 2012.  . Carotid artery disease ([place])   . Chronic diastolic heart failure ([place]) 02/14/2018  . Chronic lower back pain    "back is very stiff because of fusion; can't stand up straight more than few moments"  . Closed fracture of unspecified part of neck of femur     . Complication of anesthesia    anesth. meds caused hallucinations   . Congestive heart failure ([place])   . Constipation 12/08/2013  . Cough    no fever, chronic  . Degenerative joint disease   . Dementia   . Dementia 12/08/2013  . Depression   .  Diabetes mellitus due to underlying condition with diabetic retinopathy 02/12/2016  . Diverticulitis   . DM (diabetes mellitus), type 2 with peripheral vascular complications ([place]) 1/61/0960  . Endometriosis   . Fall at home 05/15/2013  . Gastroparesis due to DM ([place]) 02/12/2016  . GERD (gastroesophageal reflux disease)   . Headache   . Hiatal hernia   . History of blood transfusion   . History of wheezing   . HLD (hyperlipidemia)   . Hx of cardiovascular stress test    Lexiscan Myoview (11/15):  Low risk stress nuclear study with a small, mild fixed apical septal perfusion defect. EF is 52% with mild septal hypokinesis. No ischemia.  [name] Hx of echocardiogram    Echo (11/15):  Mod LVH, severe septal hypertrophy (suggestive HCM), no LVOT obstruction, rest gradient 15 mmHg across AV, vigorous LVF, EF 65-70%, no RWMA, Gr 1 DD, Ao sclerosis (no stenosis), mod LAE, mild RAE, mod TR, PASP 42 mmHg  . Hyperlipemia 12/08/2013  . Hypertension   . Hypertensive heart disease with CHF (congestive heart failure) ([place])   . Hypertonicity of bladder   . Hypertrophic cardiomyopathy ([place])   . Hypothyroidism   . IDDM (insulin dependent diabetes mellitus) ([place])   . Late onset Alzheimer's disease without behavioral disturbance 02/12/2016  . Lewy body dementia with behavioral disturbance 02/21/2014  . Lumbago   . Moderate vision impairment-both eyes 05/15/2013  . Muscle weakness (generalized)   . Obesity   . Obesity (BMI 30-39.9) 05/15/2013  . Osteoarthritis of right shoulder 10/07/2013  . Osteoarthrosis, unspecified whether generalized or localized, unspecified site   . Other specified transient cerebral ischemias 08/02/2014  . PAD (peripheral artery disease) ([place])  04/08/2017  . Palliative care encounter   . Persistent atrial fibrillation ([place]) 08/02/2014  . Progressive dementia with uncertain etiology 4/54/0981   Suspect Lewy body .  [name] Pulmonary hypertension ([place]) 02/14/2018  . Reflux esophagitis   . Rhegmatogenous retinal detachment of right eye 01/10/2013  . [place]autus post total shoulder arthroplasty 12/15/2013   RIGHT   . Stroke [facility]) 2011   "old very small TIA/neurologist"  . Thyroid disease   . Type II diabetes mellitus ([place])   . Ulcer    left foot x 2 years sees dr Little Ishikawa podiastrist for wound staying same tiny wart appearing areas no drainage changes dressing every 3 days  . Unspecified glaucoma(365.9)   . Urinary incontinence   . Urinary retention 02/14/2018  . Vitamin B 12 deficiency 02/14/2018  . Vitamin D deficiency 02/14/2018    Social History Social History   Tobacco Use  . Smoking status: Former Smoker    Packs/day: 1.50    Years: 38.00    Pack years: 57.00    Types: Cigarettes    Last attempt to quit: 09/16/1987    Years since quitting: 30.7  . Smokeless tobacco: Never Used  Substance Use Topics  . Alcohol use: No    Alcohol/week: 0.0 standard drinks  . Drug use: No    Family History Family History  Problem Relation Age of Onset  . Cancer Cousin   . Heart attack Mother   . Hypertension Mother   . Heart attack Father   . Heart disease Father        Before age 88  . Hyperlipidemia Father   . Hypertension Father   . Cancer Paternal Aunt     Surgical History Past Surgical History:  Procedure Laterality Date  . ABDOMINAL AORTOGRAM W/LOWER EXTREMITY N/A 04/08/2017  Procedure: Abdominal Aortogram w/Lower Extremity;  Surgeon: Waynetta Sandy, MD;  Location: [facility];  Service: Cardiovascular;  Laterality: N/A;  . BACK SURGERY     X2 lower  . BOTOX INJECTION N/A 12/02/2016   Procedure: BOTOX INJECTION;  Surgeon: Bjorn Loser, MD;  Location: [facility] ORS;  Service: Urology;  Laterality: N/A;  . CARDIAC  CATHETERIZATION    . CARPAL TUNNEL RELEASE Bilateral   . CATARACT EXTRACTION W/ INTRAOCULAR LENS  IMPLANT, BILATERAL Bilateral   . CHOLECYSTECTOMY    . CORONARY ARTERY BYPASS GRAFT  04/17/2011   LT  internal mammary artery to left anterior descending, saphenous vein graft first diagonal, saphenous  vein graft to obtuse marginal 1, saphenous vein graft to posterior descending  . CYSTOSCOPY N/A 12/02/2016   Procedure: CYSTOSCOPY;  Surgeon: Bjorn Loser, MD;  Location: [facility];  Service: Urology;  Laterality: N/A;  . DILATION AND CURETTAGE OF UTERUS     "for endometrosis"  . DILATION AND CURETTAGE OF UTERUS     ENDOMETRIOSIS  . ENDARTERECTOMY Left 10/01/2015   Procedure: Left CAROTID Endartarectomy;  Surgeon: Rosetta Posner, MD;  Location: [place];  Service: Vascular;  Laterality: Left;  . EYE SURGERY    . HAND SURGERY Right    "for arthritis;"  . JOINT REPLACEMENT    . LASER PHOTO ABLATION Right 01/20/2013   Procedure: LASER PHOTO ABLATION;  Surgeon: Hayden Pedro, MD;  Location: [place];  Service: Ophthalmology;  Laterality: Right;  . LOWER EXTREMITY ANGIOGRAM N/A 03/10/2012   Procedure: LOWER EXTREMITY ANGIOGRAM;  Surgeon: Jettie Booze, MD;  Location: [facility] CATH LAB;  Service: Cardiovascular;  Laterality: N/A;  . NEUROPLASTY / TRANSPOSITION MEDIAN NERVE AT CARPAL TUNNEL BILATERAL    . PARTIAL HIP ARTHROPLASTY Left 11/2009   "just the ball"  . PERIPHERAL VASCULAR CATHETERIZATION N/A 05/16/2015   Procedure: Abdominal Aortogram;  Surgeon: Elam Dutch, MD;  Location: [place] [facility];  Service: Cardiovascular;  Laterality: N/A;  . PERIPHERAL VASCULAR CATHETERIZATION Left 05/16/2015   Procedure: Lower Extremity Angiography;  Surgeon: Elam Dutch, MD;  Location: [place] CV LAB;  Service: Cardiovascular;  Laterality: Left;  . PERIPHERAL VASCULAR INTERVENTION  04/08/2017   Procedure: Peripheral Vascular Intervention;  Surgeon: Waynetta Sandy, MD;  Location: [place] [facility]  LAB;  Service: Cardiovascular;;  lt.Popliteal  . POSTERIOR FUSION LUMBAR SPINE     "removed L4-5, S1"  . PTCA  03/10/12   LLE  . RETINAL DETACHMENT SURGERY Right May 2014  . SCLERAL BUCKLE Right 01/20/2013   Procedure: SCLERAL BUCKLE;  Surgeon: Hayden Pedro, MD;  Location: [place];  Service: Ophthalmology;  Laterality: Right;  . TOTAL KNEE ARTHROPLASTY  ~ 2004   right  . TOTAL SHOULDER ARTHROPLASTY Right 10/07/2013   Procedure: TOTAL SHOULDER ARTHROPLASTY;  Surgeon: Alta Corning, MD;  Location: [place];  Service: Orthopedics;  Laterality: Right;    Allergies  Allergen Reactions  . Amitriptyline Other (See Comments)    hallucinations  . Vesicare [Solifenacin] Other (See Comments)    hallucinations  . Vicodin [Hydrocodone-Acetaminophen] Other (See Comments)    Hallucinations - do NOT verify Vicodin orders  . Polysaccharide Iron Complex Hives, Itching and Rash    Patient can tolerate Ferrous Sulfate    Current Outpatient Medications  Medication Sig Dispense Refill  . acetaminophen (TYLENOL) 650 MG CR tablet Take 650 mg by mouth every 8 (eight) hours as needed for pain.    [name] alendronate (FOSAMAX) 70 MG  tablet TK 1 T PO ONCE A WEEK  11  . apixaban (ELIQUIS) 5 MG TABS tablet Take 1 tablet (5 mg total) by mouth 2 (two) times daily. 60 tablet 11  . Ascorbic Acid (VITAMIN C) 1000 MG tablet Take 1,000 mg by mouth 3 (three) times a week.     . cholecalciferol (VITAMIN D) 1000 UNITS tablet Take 1,000 Units by mouth See admin instructions. On Monday, Tuesday, Wednesday, Friday, Saturday.    . ciprofloxacin (CIPRO) 500 MG tablet TK 1 T PO  BID FOR A FURTHER 3 DAYS  0  . COMBIGAN 0.2-0.5 % ophthalmic solution Place 1 drop into both eyes 2 (two) times daily.   0  . Cyanocobalamin (VITAMIN B 12) 100 MCG LOZG Take 1 tablet by mouth daily.    [name] doxycycline (MONODOX) 100 MG capsule TK 1 C PO BID FOR 3 ADDITIONAL DAYS  0  . ergocalciferol (VITAMIN D2) 50000 UNITS capsule Take 50,000 Units by mouth 2  (two) times a week. On Thursday and Sunday    . felodipine (PLENDIL) 10 MG 24 hr tablet Take 1 tablet (10 mg total) by mouth daily. 30 tablet 6  . ferrous sulfate 325 (65 FE) MG tablet Take 325 mg by mouth daily with breakfast.    . FLUoxetine (PROZAC) 20 MG capsule Take 20 mg by mouth every morning.     . furosemide (LASIX) 40 MG tablet Take 1 tablet (40 mg total) by mouth daily. 30 tablet   . glipiZIDE (GLUCOTROL XL) 5 MG 24 hr tablet Take 5 mg by mouth daily.  0  . HUMALOG KWIKPEN 100 UNIT/ML KiwkPen INJECT TID BEFORE MEALS PER SLIDING SCALE INSTRUCTIONS AND CALL MD AS INSTRUCTED  11  . Insulin Glargine (LANTUS SOLOSTAR) 100 UNIT/ML SOPN Inject 10 Units into the skin at bedtime.     [name] levothyroxine (SYNTHROID, LEVOTHROID) 125 MCG tablet Take 125 mcg by mouth daily before breakfast.     . losartan (COZAAR) 25 MG tablet Take 25 mg by mouth daily.   0  . memantine (NAMENDA) 10 MG tablet TAKE 1 TABLET BY MOUTH TWICE A DAY 180 tablet 3  . metoprolol tartrate (LOPRESSOR) 25 MG tablet Take 25 mg by mouth 2 (two) times daily.      . mirabegron ER (MYRBETRIQ) 50 MG TB24 Take 50 mg by mouth every evening.    . Potassium Chloride ER 20 MEQ TBCR TK 1 T PO QD WF  11  . pravastatin (PRAVACHOL) 80 MG tablet Take 1 tablet (80 mg total) by mouth daily. 90 tablet 2  . QUEtiapine (SEROQUEL) 25 MG tablet TAKE 1 TABLET BY MOUTH AT BEDTIME 30 tablet 9  . rivastigmine (EXELON) 4.6 mg/24hr APPLY 1 PATCH ONTO THE SKIN ONCE DAILY 30 patch 0  . SANTYL ointment APPLY TOPICALLY TO WOUND AND CHANGE DAILY  1  . sennosides-docusate sodium (SENOKOT-S) 8.6-50 MG tablet Take 1 tablet by mouth daily.    . silver sulfADIAZINE (SILVADENE) 1 % cream Apply 1 application topically daily. 50 g 0  . traMADol (ULTRAM) 50 MG tablet Take 1-2 tablets (50-100 mg total) by mouth every 6 (six) hours as needed for moderate pain or severe pain. 10 tablet 0  . XIIDRA 5 % SOLN INSTILL 1 DROP INTO BOTH EYES TWICE A DAY  2   No current  facility-administered medications for this visit.      REVIEW OF SYSTEMS: See HPI for pertinent positives and negatives.  Physical Examination Vitals:   06/18/18 1353  BP: 112/70  Pulse: 93  Resp: 16  Temp: 98.5 F (36.9 C)  TempSrc: Oral  SpO2: 96%  Weight: 143 lb (64.9 kg)  Height: 5' 1.5" (1.562 m)   Body mass index is 26.58 kg/m.  General:  A&O x 3, WDWN, obese female. Gait: not observed, seated in w/c HENT: No gross abnormalities.  Eyes: PERRLA. Pulmonary: Respirations are non labored, CTAB, good air movement in all fields  Cardiac: irregular rhythm, controlled rate, no detected murmur.         Carotid Bruits Right Left   Positive Positive   Radial pulses are 2+ palpable bilaterally   Adominal aortic pulse is not palpable                         VASCULAR EXAM: Extremities without ischemic changes, without Gangrene; with open wound on right shin, dressing in place, treated by nursing facility. Bilateral heel ulcers, see photo below   Bilateral heel ulcers                                                                                                                                                         LE Pulses Right Left       FEMORAL  1+palpable   2+ palpable        POPLITEAL  not palpable   not palpable       POSTERIOR TIBIAL  not palpable, + Doppler signal   not palpable, + Doppler signal        DORSALIS PEDIS      ANTERIOR TIBIAL not palpable, + Doppler signal  not palpable, + Doppler signal   PERONEAL    + Doppler signal   + Doppler signal   Abdomen: soft, NT, no palpable masses. Skin: no rashes, no cellulitis, see Extremities Musculoskeletal: age appropriate muscle wasting and atrophy.      Neurologic: A&O X 3; appropriate affect, Sensation is normal; MOTOR FUNCTION:  moving all extremities equally, motor strength 4/5 throughout. Speech is fluent/normal. CN 2-12 intact except has significant hearing loss. Psychiatric: Thought  content is normal, mood appropriate for clinical situation. Loquacious    ASSESSMENT:  [name] [name] is a 84 y.o. female who is s/p  Stent placement of left popliteal artery on 04-08-17 for a left  left popliteal occlusion. She is also s/p left CEA on 10-01-15 by Dr. Donnetta Hutching.  Pt denies ever having a stroke, but her past medical history documents stroke in 2011, did not seem to have any subsequent stroke or TIA, but pt is a vague historian. She is oriented x 3.   Pt  does not c/o claudication sx's with walking, but does not seem to walk enough to elicit claudication symptoms.    She takes a daily statin and Eliquis for  atrial fib.    Pt is referred here today by Dr. Dellia Nims for evaluation of bilateral heel ulcers. These were first noted by pt's caregiver the last week of July 2019 after pt came home from rehab in a nursing home after being hospitalized for exacerbation of CHF.   Dr. Donzetta Matters spoke with pt and caregiver and examined pt.See Plan   DATA  Segmental Leg pressures 03-26-18: IMPRESSION: Right: Resting ankle-brachial index in the mild range of arterial occlusive disease. Segmental exam demonstrates evidence of femoropopliteal occlusive disease. Toe pressure (19) confers poor wound healing capability.  Left: Resting ankle-brachial index in the moderate range of arterial occlusive disease. Segmental exam demonstrates evidence of iliac and femoropopliteal occlusive disease. Toe pressure confers (34) poor wound healing capability.   Carotid Duplex (02-26-18): Right ICA: 60-79%  Stenosis Left ICA: CEA site with no restenosis Right ECA: >50% stenosis Bilateral vertebral artery flow is antegrade.  Bilateral subclavian artery waveforms are normal.  Increased stenosis in the right ICA compared to the exam on 12-09-16.   Left LE Arterial Duplex (02-26-18): No stenosis in the left popliteal stent, but 391 cm/s velocity distal to stent.  All monophasic waveforms.    ABI  (Date: 02/26/2018):  R:  ? ABI: 0.82 (was 0.89 on 06-02-17),  ? PT: mono ? DP: mono ? TBI:  0.31 (was 0.16)  L:  ? ABI: 0.76 (was 0.83),  ? PT: waveform morphology no documented, monophasic in stent dupkex ? DP: waveform morphology no documented, monophasic in stent dupkex ? TBI: 0.35 (was 0.46) ? Slight decline in bilateral ABI, mild disease in the right, moderate in the left. ? Improved right TBI, slight decline in left TBI.    PLAN:  Based on the patient's vascular studies and examination, pt will be scheduled for arteriogram within the next two weeks with bilateral run off, possible intervention, by Dr. Donzetta Matters. Continue wound care per Dr. Dellia Nims. Keep pressure off heels.   I discussed in depth with the patient the nature of atherosclerosis, and emphasized the importance of maximal medical management including strict control of blood pressure, blood glucose, and lipid levels, obtaining regular exercise, and cessation of smoking.  The patient is aware that without maximal medical management the underlying atherosclerotic disease process will progress, limiting the benefit of any interventions.  The patient was given information about stroke prevention and what symptoms should prompt the patient to seek immediate medical care.  The patient was given information about PAD including signs, symptoms, treatment, what symptoms should prompt the patient to seek immediate medical care, and risk reduction measures to take.  Thank you for allowing [place] to participate in this patient's care.  Clemon Chambers, RN, MSN, FNP-C [facility] Office: [phone]  Clinic MD: Donzetta Matters 06/18/2018 2:10 PM

## 2018-06-18 NOTE — Telephone Encounter (Signed)
Conversing with daughter via [place].

## 2018-06-19 ENCOUNTER — Encounter: Payer: Self-pay | Admitting: Family

## 2018-06-19 ENCOUNTER — Other Ambulatory Visit: Payer: Self-pay | Admitting: Neurology

## 2018-06-19 DIAGNOSIS — F0280 Dementia in other diseases classified elsewhere without behavioral disturbance: Secondary | ICD-10-CM

## 2018-06-19 DIAGNOSIS — G3183 Dementia with Lewy bodies: Principal | ICD-10-CM

## 2018-06-21 ENCOUNTER — Other Ambulatory Visit: Payer: Self-pay | Admitting: *Deleted

## 2018-06-21 NOTE — Telephone Encounter (Signed)
Lewy Body Dementia information mailed to pt's daughter [name] [name].

## 2018-06-24 ENCOUNTER — Other Ambulatory Visit (HOSPITAL_COMMUNITY)
Admission: RE | Admit: 2018-06-24 | Discharge: 2018-06-24 | Disposition: A | Discharge status: Home / Self Care | Payer: Medicare Other | Source: Other Acute Inpatient Hospital | Attending: Internal Medicine | Admitting: Internal Medicine

## 2018-06-24 DIAGNOSIS — L89622 Pressure ulcer of left heel, stage 2: Secondary | ICD-10-CM | POA: Diagnosis not present

## 2018-06-24 DIAGNOSIS — E11621 Type 2 diabetes mellitus with foot ulcer: Secondary | ICD-10-CM | POA: Insufficient documentation

## 2018-06-28 ENCOUNTER — Other Ambulatory Visit: Payer: Self-pay

## 2018-06-28 ENCOUNTER — Ambulatory Visit (HOSPITAL_COMMUNITY)
Admission: RE | Admit: 2018-06-28 | Discharge: 2018-06-28 | Disposition: A | Discharge status: Home / Self Care | Payer: Medicare Other | Source: Ambulatory Visit | Attending: Vascular Surgery | Admitting: Vascular Surgery

## 2018-06-28 ENCOUNTER — Encounter (HOSPITAL_COMMUNITY)
Admission: RE | Disposition: A | Discharge status: Home / Self Care | Payer: Self-pay | Source: Ambulatory Visit | Attending: Vascular Surgery

## 2018-06-28 ENCOUNTER — Telehealth: Payer: Self-pay | Admitting: Vascular Surgery

## 2018-06-28 DIAGNOSIS — L97411 Non-pressure chronic ulcer of right heel and midfoot limited to breakdown of skin: Secondary | ICD-10-CM | POA: Diagnosis not present

## 2018-06-28 DIAGNOSIS — Z7901 Long term (current) use of anticoagulants: Secondary | ICD-10-CM | POA: Insufficient documentation

## 2018-06-28 DIAGNOSIS — E1151 Type 2 diabetes mellitus with diabetic peripheral angiopathy without gangrene: Secondary | ICD-10-CM | POA: Insufficient documentation

## 2018-06-28 DIAGNOSIS — I422 Other hypertrophic cardiomyopathy: Secondary | ICD-10-CM | POA: Diagnosis not present

## 2018-06-28 DIAGNOSIS — E1143 Type 2 diabetes mellitus with diabetic autonomic (poly)neuropathy: Secondary | ICD-10-CM | POA: Diagnosis not present

## 2018-06-28 DIAGNOSIS — Z8673 Personal history of transient ischemic attack (TIA), and cerebral infarction without residual deficits: Secondary | ICD-10-CM | POA: Insufficient documentation

## 2018-06-28 DIAGNOSIS — Z951 Presence of aortocoronary bypass graft: Secondary | ICD-10-CM | POA: Insufficient documentation

## 2018-06-28 DIAGNOSIS — Z9841 Cataract extraction status, right eye: Secondary | ICD-10-CM | POA: Insufficient documentation

## 2018-06-28 DIAGNOSIS — E039 Hypothyroidism, unspecified: Secondary | ICD-10-CM | POA: Diagnosis not present

## 2018-06-28 DIAGNOSIS — E538 Deficiency of other specified B group vitamins: Secondary | ICD-10-CM | POA: Diagnosis not present

## 2018-06-28 DIAGNOSIS — I110 Hypertensive heart disease with heart failure: Secondary | ICD-10-CM | POA: Insufficient documentation

## 2018-06-28 DIAGNOSIS — Z9049 Acquired absence of other specified parts of digestive tract: Secondary | ICD-10-CM | POA: Insufficient documentation

## 2018-06-28 DIAGNOSIS — Z96611 Presence of right artificial shoulder joint: Secondary | ICD-10-CM | POA: Insufficient documentation

## 2018-06-28 DIAGNOSIS — I6523 Occlusion and stenosis of bilateral carotid arteries: Secondary | ICD-10-CM | POA: Diagnosis not present

## 2018-06-28 DIAGNOSIS — L97421 Non-pressure chronic ulcer of left heel and midfoot limited to breakdown of skin: Secondary | ICD-10-CM | POA: Diagnosis not present

## 2018-06-28 DIAGNOSIS — E11621 Type 2 diabetes mellitus with foot ulcer: Secondary | ICD-10-CM | POA: Insufficient documentation

## 2018-06-28 DIAGNOSIS — E669 Obesity, unspecified: Secondary | ICD-10-CM | POA: Insufficient documentation

## 2018-06-28 DIAGNOSIS — Z9842 Cataract extraction status, left eye: Secondary | ICD-10-CM | POA: Insufficient documentation

## 2018-06-28 DIAGNOSIS — Z96651 Presence of right artificial knee joint: Secondary | ICD-10-CM | POA: Insufficient documentation

## 2018-06-28 DIAGNOSIS — Z79899 Other long term (current) drug therapy: Secondary | ICD-10-CM | POA: Insufficient documentation

## 2018-06-28 DIAGNOSIS — K3184 Gastroparesis: Secondary | ICD-10-CM | POA: Diagnosis not present

## 2018-06-28 DIAGNOSIS — E11319 Type 2 diabetes mellitus with unspecified diabetic retinopathy without macular edema: Secondary | ICD-10-CM | POA: Diagnosis not present

## 2018-06-28 DIAGNOSIS — E559 Vitamin D deficiency, unspecified: Secondary | ICD-10-CM | POA: Insufficient documentation

## 2018-06-28 DIAGNOSIS — Z9889 Other specified postprocedural states: Secondary | ICD-10-CM | POA: Insufficient documentation

## 2018-06-28 DIAGNOSIS — M1990 Unspecified osteoarthritis, unspecified site: Secondary | ICD-10-CM | POA: Insufficient documentation

## 2018-06-28 DIAGNOSIS — Z79890 Hormone replacement therapy (postmenopausal): Secondary | ICD-10-CM | POA: Diagnosis not present

## 2018-06-28 DIAGNOSIS — Z8249 Family history of ischemic heart disease and other diseases of the circulatory system: Secondary | ICD-10-CM | POA: Insufficient documentation

## 2018-06-28 DIAGNOSIS — I70234 Atherosclerosis of native arteries of right leg with ulceration of heel and midfoot: Secondary | ICD-10-CM | POA: Diagnosis not present

## 2018-06-28 DIAGNOSIS — Z794 Long term (current) use of insulin: Secondary | ICD-10-CM | POA: Diagnosis not present

## 2018-06-28 DIAGNOSIS — Z955 Presence of coronary angioplasty implant and graft: Secondary | ICD-10-CM | POA: Insufficient documentation

## 2018-06-28 DIAGNOSIS — Z87891 Personal history of nicotine dependence: Secondary | ICD-10-CM | POA: Insufficient documentation

## 2018-06-28 DIAGNOSIS — I5032 Chronic diastolic (congestive) heart failure: Secondary | ICD-10-CM | POA: Insufficient documentation

## 2018-06-28 DIAGNOSIS — I70244 Atherosclerosis of native arteries of left leg with ulceration of heel and midfoot: Secondary | ICD-10-CM | POA: Insufficient documentation

## 2018-06-28 DIAGNOSIS — Z981 Arthrodesis status: Secondary | ICD-10-CM | POA: Insufficient documentation

## 2018-06-28 HISTORY — PX: ABDOMINAL AORTOGRAM W/LOWER EXTREMITY: CATH118223

## 2018-06-28 LAB — POCT I-STAT, CHEM 8
BUN: 23 mg/dL (ref 8–23)
CALCIUM ION: 1.19 mmol/L (ref 1.15–1.40)
Chloride: 97 mmol/L — ABNORMAL LOW (ref 98–111)
Creatinine, Ser: 1.2 mg/dL — ABNORMAL HIGH (ref 0.44–1.00)
Glucose, Bld: 107 mg/dL — ABNORMAL HIGH (ref 70–99)
HCT: 40 % (ref 36.0–46.0)
HEMOGLOBIN: 13.6 g/dL (ref 12.0–15.0)
Potassium: 4.1 mmol/L (ref 3.5–5.1)
SODIUM: 136 mmol/L (ref 135–145)
TCO2: 30 mmol/L (ref 22–32)

## 2018-06-28 LAB — GLUCOSE, CAPILLARY: GLUCOSE-CAPILLARY: 129 mg/dL — AB (ref 70–99)

## 2018-06-28 SURGERY — ABDOMINAL AORTOGRAM W/LOWER EXTREMITY
Anesthesia: LOCAL

## 2018-06-28 MED ORDER — IODIXANOL 320 MG/ML IV SOLN
INTRAVENOUS | Status: DC | PRN
  Administered 2018-06-28: 120 mL via INTRA_ARTERIAL

## 2018-06-28 MED ORDER — SODIUM CHLORIDE 0.9 % WEIGHT BASED INFUSION: 1.0000 mL/kg/h | INTRAVENOUS | Status: DC

## 2018-06-28 MED ORDER — HEPARIN (PORCINE) IN NACL 1000-0.9 UT/500ML-% IV SOLN
INTRAVENOUS | Status: AC
  Filled 2018-06-28: qty 1000

## 2018-06-28 MED ORDER — OXYCODONE HCL 5 MG PO TABS: 5.0000 mg | ORAL_TABLET | ORAL | Status: DC | PRN

## 2018-06-28 MED ORDER — SODIUM CHLORIDE 0.9 % IV SOLN
INTRAVENOUS | Status: DC
  Administered 2018-06-28: 10:00:00 via INTRAVENOUS

## 2018-06-28 MED ORDER — HYDRALAZINE HCL 20 MG/ML IJ SOLN: 5.0000 mg | INTRAMUSCULAR | Status: DC | PRN

## 2018-06-28 MED ORDER — HEPARIN SODIUM (PORCINE) 1000 UNIT/ML IJ SOLN
INTRAMUSCULAR | Status: DC | PRN
  Administered 2018-06-28: 7000 [IU] via INTRAVENOUS

## 2018-06-28 MED ORDER — HEPARIN SODIUM (PORCINE) 1000 UNIT/ML IJ SOLN
INTRAMUSCULAR | Status: AC
  Filled 2018-06-28: qty 1

## 2018-06-28 MED ORDER — FENTANYL CITRATE (PF) 100 MCG/2ML IJ SOLN
INTRAMUSCULAR | Status: DC | PRN
  Administered 2018-06-28: 25 ug via INTRAVENOUS

## 2018-06-28 MED ORDER — SODIUM CHLORIDE 0.9 % IV SOLN: 250.0000 mL | INTRAVENOUS | Status: DC | PRN

## 2018-06-28 MED ORDER — LIDOCAINE HCL (PF) 1 % IJ SOLN
INTRAMUSCULAR | Status: AC
  Filled 2018-06-28: qty 30

## 2018-06-28 MED ORDER — FENTANYL CITRATE (PF) 100 MCG/2ML IJ SOLN
INTRAMUSCULAR | Status: AC
  Filled 2018-06-28: qty 2

## 2018-06-28 MED ORDER — HEPARIN (PORCINE) IN NACL 1000-0.9 UT/500ML-% IV SOLN
INTRAVENOUS | Status: DC | PRN
  Administered 2018-06-28 (×2): 500 mL

## 2018-06-28 MED ORDER — SODIUM CHLORIDE 0.9% FLUSH: 3.0000 mL | Freq: Two times a day (BID) | INTRAVENOUS | Status: DC

## 2018-06-28 MED ORDER — LABETALOL HCL 5 MG/ML IV SOLN: 10.0000 mg | INTRAVENOUS | Status: DC | PRN

## 2018-06-28 MED ORDER — LIDOCAINE HCL (PF) 1 % IJ SOLN
INTRAMUSCULAR | Status: DC | PRN
  Administered 2018-06-28: 2 mL via INTRADERMAL
  Administered 2018-06-28: 15 mL via INTRADERMAL

## 2018-06-28 MED ORDER — ONDANSETRON HCL 4 MG/2ML IJ SOLN: 4.0000 mg | Freq: Four times a day (QID) | INTRAMUSCULAR | Status: DC | PRN

## 2018-06-28 MED ORDER — SODIUM CHLORIDE 0.9% FLUSH: 3.0000 mL | INTRAVENOUS | Status: DC | PRN

## 2018-06-28 SURGICAL SUPPLY — 14 items
CATH OMNI FLUSH 5F 65CM (CATHETERS) ×2 IMPLANT
CLOSURE MYNX CONTROL 6F/7F (Vascular Products) ×2 IMPLANT
KIT MICROPUNCTURE NIT STIFF (SHEATH) ×2 IMPLANT
KIT PV (KITS) ×2 IMPLANT
SHEATH FLEX ANSEL ST 6FR 45CM (SHEATH) ×2 IMPLANT
SHEATH MICROPUNCTURE PEDAL 4FR (SHEATH) ×2 IMPLANT
SHEATH PINNACLE 5F 10CM (SHEATH) ×2 IMPLANT
SHEATH PINNACLE 6F 10CM (SHEATH) ×2 IMPLANT
SHEATH PROBE COVER 6X72 (BAG) ×4 IMPLANT
SYR MEDRAD MARK V 150ML (SYRINGE) ×2 IMPLANT
TRANSDUCER W/STOPCOCK (MISCELLANEOUS) ×2 IMPLANT
TRAY PV CATH (CUSTOM PROCEDURE TRAY) ×2 IMPLANT
WIRE BENTSON .035X145CM (WIRE) ×2 IMPLANT
WIRE TORQFLEX AUST .018X40CM (WIRE) ×12 IMPLANT

## 2018-06-28 NOTE — H&P (Signed)
   History and Physical Update  The patient was interviewed and re-examined.  The patient's previous History and Physical has been reviewed and is unchanged from recent office visit. Plan for aortogram with ble runoff and possible intervention on the left.   Nelli Swalley C. Donzetta Matters, MD [facility] [place] Office: [phone] Pager: [phone]

## 2018-06-28 NOTE — Telephone Encounter (Signed)
sch appt spk to pt 07/30/18 115pm f/u MD

## 2018-06-28 NOTE — Op Note (Signed)
    Patient name: [name] [name] MRN: [id number] DOB: [birth year] Sex: female  06/28/2018 Pre-operative Diagnosis: Bilateral lower extremity heel ulceration Post-operative diagnosis:  Same Surgeon:  Erlene Quan C. Donzetta Matters, MD Procedure Performed: 1.  Ultrasound-guided cannulation left common femoral artery 2.  Aortogram bilateral lower extremity runoff 3.  Ultrasound-guided cannulation right posterior tibial artery 4.  Mynx device closure left common femoral artery  Indications: 84 year old female with history of bilateral heel ulceration.  She is followed at the wound care center.  She is indicated for anterior and possible intervention having had previous stenting of the left popliteal artery.  Findings: On the right side which is the site of interest today she is in line flow to the level of the popliteal artery with multiple approximately 30% stenosis of her SFA.  She then is flow via her peroneal artery all the way to the ankle.  She gives off the posterior tibial artery there.  Unfortunately although I was able to cannulate the posterior tibial artery I could not get the wire to go anywhere retrograde in order to treat her.  On the left side she has what appears to be approximately 50% stenosis of the proximal and distal stent and then anterior tibial flow to the foot.  There is no apparent flow in the posterior tibial artery to the heel.  From that standpoint I would not recommend intervening on the stent on the left.  Patient will be a very high risk for bilateral leg amputations if the heel ulcers cannot heal with wound care.   Procedure:  The patient was identified in the holding area and taken to room 8.  The patient was then placed supine on the table and prepped and draped in the usual sterile fashion.  A time out was called.  Ultrasound was used to evaluate the left common femoral artery this was noted to be patent and an image was saved the permanent record.  This was cannulated with direct  ultrasound guidance with micropuncture needle followed by wire sheath.  I then placed a Bentson wire followed by 5 French sheath and Omni catheter performed aortogram with bilateral lower extremity runoff with the above findings.  We then crossed the bifurcation using on the catheter Bentson wire exchanged for a long 6 French sheath into the right SFA and the patient was heparinized with 7000 units of heparin.  Given that there is no takeoff the posterior tibial artery I then turned my attention towards cannulating the posterior tibial itself.  I was able to initially gain access with ultrasound guidance.  This did demonstrate some flow in it and an image was saved the permanent record.  Unfortunately cannot get a wire to pass.  I made multiple attempts to cannulate the artery with no wire ever passing despite what was certain arterial access.  I did have one cannulation of the vein and performed angiogram there to confirm.  Given that I could not get a wire past despite arterial access I elected to terminate the procedure.  Patient will need continued wound care will be at higher risk for amputation.  Contrast: 120 cc.    Takashi Korol C. Donzetta Matters, MD [facility] Office: [phone] Pager: [phone]

## 2018-06-28 NOTE — Discharge Instructions (Signed)

## 2018-06-29 ENCOUNTER — Encounter (HOSPITAL_COMMUNITY): Payer: Self-pay | Admitting: Vascular Surgery

## 2018-06-29 LAB — AEROBIC CULTURE W GRAM STAIN (SUPERFICIAL SPECIMEN)

## 2018-06-29 LAB — AEROBIC CULTURE  (SUPERFICIAL SPECIMEN)

## 2018-06-30 ENCOUNTER — Encounter: Payer: Self-pay | Admitting: Neurology

## 2018-06-30 LAB — POCT ACTIVATED CLOTTING TIME: Activated Clotting Time: 213 seconds

## 2018-07-02 ENCOUNTER — Other Ambulatory Visit (HOSPITAL_COMMUNITY)
Admission: RE | Admit: 2018-07-02 | Discharge: 2018-07-02 | Disposition: A | Discharge status: Home / Self Care | Payer: Medicare Other | Source: Other Acute Inpatient Hospital | Attending: Internal Medicine | Admitting: Internal Medicine

## 2018-07-02 ENCOUNTER — Other Ambulatory Visit (HOSPITAL_BASED_OUTPATIENT_CLINIC_OR_DEPARTMENT_OTHER): Payer: Self-pay | Admitting: Internal Medicine

## 2018-07-02 DIAGNOSIS — L97418 Non-pressure chronic ulcer of right heel and midfoot with other specified severity: Secondary | ICD-10-CM | POA: Insufficient documentation

## 2018-07-02 DIAGNOSIS — L89622 Pressure ulcer of left heel, stage 2: Secondary | ICD-10-CM | POA: Diagnosis not present

## 2018-07-05 ENCOUNTER — Telehealth: Payer: Self-pay | Admitting: Neurology

## 2018-07-05 ENCOUNTER — Telehealth: Payer: Self-pay

## 2018-07-05 NOTE — Telephone Encounter (Signed)
Called the daughter to make her aware that there is an opening in the NP schedule on tues 10/29 at 11:30 am. Informed I would hold the slot but if I had not heard back by end of day then I would take that apt off hold.   If patient calls back and would like the 10/29 apt at 11:30 please schedule or if anything else opens between now and her return call.

## 2018-07-05 NOTE — Telephone Encounter (Signed)
Phone call placed to patient's caregiver to schedule visit with Palliative care. Visit scheduled for 07/06/18

## 2018-07-05 NOTE — Telephone Encounter (Signed)
Pts daughter returning RNs call, stating the pt will be here at 8 for the 11:30 appt on 10/29

## 2018-07-06 ENCOUNTER — Other Ambulatory Visit: Payer: Medicare Other | Admitting: Internal Medicine

## 2018-07-06 ENCOUNTER — Encounter: Payer: Self-pay | Admitting: Internal Medicine

## 2018-07-06 DIAGNOSIS — R410 Disorientation, unspecified: Secondary | ICD-10-CM

## 2018-07-06 DIAGNOSIS — R0609 Other forms of dyspnea: Principal | ICD-10-CM

## 2018-07-06 DIAGNOSIS — E1151 Type 2 diabetes mellitus with diabetic peripheral angiopathy without gangrene: Secondary | ICD-10-CM

## 2018-07-06 NOTE — Progress Notes (Signed)
PALLIATIVE CARE CONSULT VISIT   PATIENT NAME: [name] [name] DOB: Apr 06, 1934 MRN: [id number]  PRIMARY CARE PROVIDER:   Lajean Manes, MD Has seen within the last 3 weeks (flu shot).  REFERRING PROVIDER:  Lajean Manes, MD [address] [place], [place] [address] Other Agencies: [facility]: SN visits 2xs/week for dressing changes Tue/Thurs. Private paid caregiver Manuela Schwartz changes on Sundays, [facility]'s office does Fri.  Private pay [employer]lecia Jan [phone]) is a live in care giver since end of July 2019  RESPONSIBLE PARTY:   Daughters [name] and Gwenlyn Perking.  HPI: Pleasant 84yo diabetic Caucasian female with h/o peripheral arterial disease, atrial fibrillation, CAD (CABG 3149), diastolic heart failure, and dementia (late onset Alzheimer's without behavioral disturbance). She has deep bilateral heel ulcerations R > L. She has had a recent right heel bone biopsy to assess for osteomyelitis. Results are pending. She is A & O X 3, but she has frequent episodes of confusion and forgetfulness. She occasionally gets fixated on a task (emptying trash).She sometimes forgets the function of common objects. She occasionally needs cueing to swallow her pills. Over the last few weeks she has been experiencing progressive DOE and fatigue with ambulation. She walks slowly with a walker. Last fall was 4 months ago after tripping over her walker. She is mostly independent in her ADLs. She has a good appetite and morning fasting blood sugars run 105-120. He weight is stable at 140lbs, She had a weight loss of 40 lbs over the last 4-5 months after diuresis and discontinuation of junk food. She has a live in 24/7 paid caregiver. They live in the home of one of her daughters. Her 2 daughters are very involved in patient's care. During today's visit the caregiver Daine Floras is present and is the source of patient information.    IMPRESSION / RECOMMENDATIONS:  1. DOE, progressive. Her heart failure seems  compensated by exam. She has mild asymetric shin edema but that appears r/t her extensive right heel ulcer and possible osteomyelitis. She is on a blood thinner (Eliquis) and is compliant with taking this, so likelihood of pulmonary emboli appears low. She has known pulmonary HTN with last ECHO 2015 (PA pressure 42, mod TR) -consider CXR to r/o CHF -consider repeat echocardiogram; monitor for progression pulm HTN and associated valve disorder. Check for evidence progression of CAD (EF/wall motion abnormalities). -repeat NP visit to check resting and ambulatory oxygen saturations, to  assess for need of home O2.  2. Bilateral heel ulcers. Family uses "floaties" around calves to keep heels off of the bed. Dressing changes QOD at home and at Dr. Janalyn Rouse office (every Friday).  3. Confusion. I can see on encounter notes that family is planning a to follow up with Dr. Brett Fairy (neurology).  4. DM. Home blood sugars stable 5. Advanced Care Directives: DNR/MOST forms reviewed and left in the home.  6. F/U NP visit to and complete MOST forms with family if they wish. Call Manuela Schwartz to schedule apt to be seen in 1-2 months  I spent 105 minutes providing this consultation,  from 3pm to 4:45pm.  More than 50% of the time in this consultation was spent coordinating communication.   CODE STATUS: Full  PPS: 60% HOSPICE ELIGIBILITY/DIAGNOSIS: TBD  PAST MEDICAL HISTORY:  Past Medical History:  Diagnosis Date  . Abnormality of gait 08/13/2015  . Acute respiratory failure with hypoxia ([place])   . Anemia   . Anxiety   . Arthritis    "  right wrist, lower back, maybe my right shoulder" (04/08/2017)  . Atrial fibrillation ([place])   . Bilateral carotid artery stenosis   . Bowel incontinence   . CAD (coronary artery disease)    a. s/p CABG 2012.  . Carotid artery disease ([place])   . Chronic diastolic heart failure ([place]) 02/14/2018  . Chronic lower back pain    "back is very stiff because of fusion; can't stand up  straight more than few moments"  . Closed fracture of unspecified part of neck of femur   . Complication of anesthesia    anesth. meds caused hallucinations   . Congestive heart failure ([place])   . Constipation 12/08/2013  . Cough    no fever, chronic  . Degenerative joint disease   . Dementia ([place])   . Dementia ([place]) 12/08/2013  . Depression   . Diabetes mellitus due to underlying condition with diabetic retinopathy 02/12/2016  . Diverticulitis   . DM (diabetes mellitus), type 2 with peripheral vascular complications ([place]) 6/41/5830  . Endometriosis   . Fall at home 05/15/2013  . Gastroparesis due to DM ([place]) 02/12/2016  . GERD (gastroesophageal reflux disease)   . Headache   . Hiatal hernia   . History of blood transfusion   . History of wheezing   . HLD (hyperlipidemia)   . Hx of cardiovascular stress test    Lexiscan Myoview (11/15):  Low risk stress nuclear study with a small, mild fixed apical septal perfusion defect. EF is 52% with mild septal hypokinesis. No ischemia.  [name] Hx of echocardiogram    Echo (11/15):  Mod LVH, severe septal hypertrophy (suggestive HCM), no LVOT obstruction, rest gradient 15 mmHg across AV, vigorous LVF, EF 65-70%, no RWMA, Gr 1 DD, Ao sclerosis (no stenosis), mod LAE, mild RAE, mod TR, PASP 42 mmHg  . Hyperlipemia 12/08/2013  . Hypertension   . Hypertensive heart disease with CHF (congestive heart failure) ([place])   . Hypertonicity of bladder   . Hypertrophic cardiomyopathy ([place])   . Hypothyroidism   . IDDM (insulin dependent diabetes mellitus) ([place])   . Late onset Alzheimer's disease without behavioral disturbance ([place]) 02/12/2016  . Lewy body dementia with behavioral disturbance ([place]) 02/21/2014  . Lumbago   . Moderate vision impairment-both eyes 05/15/2013  . Muscle weakness (generalized)   . Obesity   . Obesity (BMI 30-39.9) 05/15/2013  . Osteoarthritis of right shoulder 10/07/2013  . Osteoarthrosis, unspecified whether generalized or localized,  unspecified site   . Other specified transient cerebral ischemias 08/02/2014  . PAD (peripheral artery disease) ([place]) 04/08/2017  . Palliative care encounter   . Persistent atrial fibrillation 08/02/2014  . Progressive dementia with uncertain etiology ([place]) 05/15/2013   Suspect Lewy body .  [name] Pulmonary hypertension ([place]) 02/14/2018  . Reflux esophagitis   . Rhegmatogenous retinal detachment of right eye 01/10/2013  . [place] post total shoulder arthroplasty 12/15/2013   RIGHT   . Stroke [facility]) 2011   "old very small TIA/neurologist"  . Thyroid disease   . Type II diabetes mellitus ([place])   . Ulcer    left foot x 2 years sees dr Little Ishikawa podiastrist for wound staying same tiny wart appearing areas no drainage changes dressing every 3 days  . Unspecified glaucoma(365.9)   . Urinary incontinence   . Urinary retention 02/14/2018  . Vitamin B 12 deficiency 02/14/2018  . Vitamin D deficiency 02/14/2018    SOCIAL HX:  Social History   Tobacco Use  . Smoking status: Former Smoker  Packs/day: 1.50    Years: 38.00    Pack years: 57.00    Types: Cigarettes    Last attempt to quit: 09/16/1987    Years since quitting: 30.8  . Smokeless tobacco: Never Used  Substance Use Topics  . Alcohol use: No    Alcohol/week: 0.0 standard drinks    ALLERGIES:  Allergies  Allergen Reactions  . Amitriptyline Other (See Comments)    hallucinations  . Vesicare [Solifenacin] Other (See Comments)    hallucinations  . Vicodin [Hydrocodone-Acetaminophen] Other (See Comments)    Hallucinations - do NOT verify Vicodin orders  . Polysaccharide Iron Complex Hives, Itching, Rash and Other (See Comments)    Patient can tolerate Ferrous Sulfate     PERTINENT MEDICATIONS:  Outpatient Encounter Medications as of 07/06/2018  Medication Sig  . acetaminophen (TYLENOL) 650 MG CR tablet Take 650 mg by mouth every evening.   [name] alendronate (FOSAMAX) 70 MG tablet Take 70 mg by mouth every Sunday.   [name] apixaban (ELIQUIS) 5 MG  TABS tablet Take 1 tablet (5 mg total) by mouth 2 (two) times daily.  . Ascorbic Acid (VITAMIN C) 1000 MG tablet Take 1,000 mg by mouth every Monday, Wednesday, and Friday.   . Carboxymeth-Glycerin-Polysorb (REFRESH OPTIVE ADVANCED) 0.5-1-0.5 % SOLN Place 1 drop into both eyes 2 (two) times daily.  . cholecalciferol (VITAMIN D) 1000 UNITS tablet Take 1,000 Units by mouth See admin instructions. Take 1000 units by mouth daily on Monday, Tuesday, Wednesday, Friday and Saturday.  . COMBIGAN 0.2-0.5 % ophthalmic solution Place 1 drop into both eyes 2 (two) times daily.   . Cyanocobalamin (VITAMIN B 12) 100 MCG LOZG Take 1 tablet by mouth daily. (Patient not taking: Reported on 06/22/2018)  . ergocalciferol (VITAMIN D2) 50000 UNITS capsule Take 50,000 Units by mouth See admin instructions. Take 50000 units by mouth twice weekly on Thursday and Sunday  . felodipine (PLENDIL) 10 MG 24 hr tablet Take 1 tablet (10 mg total) by mouth daily.  . ferrous sulfate 325 (65 FE) MG tablet Take 325 mg by mouth daily with breakfast.  . FLUoxetine (PROZAC) 20 MG capsule Take 20 mg by mouth every morning.   . furosemide (LASIX) 40 MG tablet Take 1 tablet (40 mg total) by mouth daily.  [name] glipiZIDE (GLUCOTROL XL) 5 MG 24 hr tablet Take 5 mg by mouth every evening.   [name] HUMALOG KWIKPEN 100 UNIT/ML KiwkPen Inject 1-9 Units into the skin 3 (three) times daily. Per sliding scale  . Insulin Glargine (LANTUS SOLOSTAR) 100 UNIT/ML SOPN Inject 10 Units into the skin at bedtime.   [name] levothyroxine (SYNTHROID, LEVOTHROID) 125 MCG tablet Take 125 mcg by mouth daily before breakfast.   . losartan (COZAAR) 25 MG tablet Take 25 mg by mouth daily.   . memantine (NAMENDA) 10 MG tablet TAKE 1 TABLET BY MOUTH TWICE A DAY (Patient taking differently: Take 10 mg by mouth 2 (two) times daily. )  . metoprolol tartrate (LOPRESSOR) 25 MG tablet Take 25 mg by mouth 2 (two) times daily.    . mirabegron ER (MYRBETRIQ) 50 MG TB24 Take 50 mg by mouth  every evening.  . polyethylene glycol (MIRALAX / GLYCOLAX) packet Take 17 g by mouth daily as needed for moderate constipation.  . pravastatin (PRAVACHOL) 80 MG tablet Take 1 tablet (80 mg total) by mouth daily. (Patient taking differently: Take 80 mg by mouth every evening. )  . QUEtiapine (SEROQUEL) 25 MG tablet TAKE 1 TABLET BY MOUTH AT BEDTIME (  Patient taking differently: Take 25 mg by mouth at bedtime. )  . rivastigmine (EXELON) 4.6 mg/24hr APPLY 1 PATCH EXTERNALLY TO THE SKIN EVERY DAY (Patient taking differently: Place 4.6 mg onto the skin daily. )  . SANTYL ointment Apply 1 application topically daily.   . silver sulfADIAZINE (SILVADENE) 1 % cream Apply 1 application topically daily. (Patient not taking: Reported on 06/22/2018)  . traMADol (ULTRAM) 50 MG tablet Take 1-2 tablets (50-100 mg total) by mouth every 6 (six) hours as needed for moderate pain or severe pain. (Patient not taking: Reported on 06/22/2018)  . XIIDRA 5 % SOLN Place 1 drop into both eyes 2 (two) times daily.   . [DISCONTINUED] amiodarone (PACERONE) 200 MG tablet Take 200 mg by mouth 3 (three) times a week.    No facility-administered encounter medications on file as of 07/06/2018.     PHYSICAL EXAM:  VS:BP 140/60, HR 80, RR 20 General: NAD, frail appearing Cardiovascular: irregular rate and rhythm Pulmonary: clear bilateral lung fields Abdomen: soft, nontender, + bowel sounds GU: no suprapubic tenderness Extremities: Mild R LE edema softly pitting. Mild right lower shin LE redness and mild erythemia Neurological: Weakness but otherwise nonfocal. Forgetful  Julianne Handler, NP

## 2018-07-07 ENCOUNTER — Encounter: Payer: Self-pay | Admitting: Neurology

## 2018-07-07 ENCOUNTER — Telehealth: Payer: Self-pay | Admitting: Neurology

## 2018-07-07 NOTE — Telephone Encounter (Signed)
We have attempted to call the patient 2 times to schedule sleep study. Patient has been unavailable at the phone numbers we have on file and has not returned our calls. At this point we will send a letter asking pt to please contact the sleep lab to schedule their sleep study. If patient calls back we will schedule them for their sleep study. ° °

## 2018-07-08 ENCOUNTER — Encounter: Payer: Self-pay | Admitting: *Deleted

## 2018-07-08 LAB — AEROBIC/ANAEROBIC CULTURE (SURGICAL/DEEP WOUND)

## 2018-07-08 LAB — AEROBIC/ANAEROBIC CULTURE W GRAM STAIN (SURGICAL/DEEP WOUND)

## 2018-07-09 ENCOUNTER — Encounter: Payer: Self-pay | Admitting: Neurology

## 2018-07-09 DIAGNOSIS — L89622 Pressure ulcer of left heel, stage 2: Secondary | ICD-10-CM | POA: Diagnosis not present

## 2018-07-12 NOTE — Telephone Encounter (Signed)
Dear caregiver of  [name] [name],  Thank you for sharing this with me. Yes, I fear an anesthesia will unravel her and she will need to move to rehab/ care facility and - in my experience, not likely to return to her home.  Ambulation itself is a marker for dementia progression, and non ambulatory patient's tend to progress faster.  She has pain? Fever? A seeping, smelly wound infection? Your choice would still be amputation. She is pain free and wants to stay and die at home? - You need to involve hospice now.   This is a difficult time for you, and please don't hesitate to call me or e mail me.  Larey Seat, MD

## 2018-07-13 ENCOUNTER — Ambulatory Visit: Payer: Medicare Other | Admitting: Adult Health

## 2018-07-13 ENCOUNTER — Encounter: Payer: Self-pay | Admitting: Adult Health

## 2018-07-13 VITALS — BP 136/74 | HR 100 | Ht 60.0 in | Wt 140.0 lb

## 2018-07-13 DIAGNOSIS — F0390 Unspecified dementia without behavioral disturbance: Secondary | ICD-10-CM

## 2018-07-13 NOTE — Patient Instructions (Signed)
Your Plan:  Continue Exelon patch Continue seroquel If your symptoms worsen or you develop new symptoms please let us know.   Thank you for coming to see [place] at [facility]. I hope we have been able to provide you high quality care today.  You may receive a patient satisfaction survey over the next few weeks. We would appreciate your feedback and comments so that we may continue to improve ourselves and the health of our patients.

## 2018-07-13 NOTE — Progress Notes (Signed)
PATIENT: [name] [name] DOB: 17-Mar-1934  REASON FOR VISIT: follow up HISTORY FROM: patient  HISTORY OF PRESENT ILLNESS: Today 07/13/18:  [name] [name] is an 84 year old female with a history of dementia.  She returns today for follow-up.  She is here today with her caregiver and daughter.  They report that since the last visit she had increasing confusion.  They deny any significant changes with her medications.  She was placed on 2 antibiotics for wound on the right heel however family reports that they did not notice any change in her cognition related to when these medications were initiated.  She has a caregiver that they stays with her 24 hours a day.  She requires assistance with most ADLs.  Reports good appetite.  Denies any trouble sleeping.  Her daughter manages her finances.  Caregiver manages her medications and appointments.  The patient is seen a wound specialist.  Unfortunately the next option is to amputate right foot or call in hospice.  The patient's family has not made a decision.  Family reports that the patient is not having any hallucinations.  She remains on Seroquel and Exelon.  HISTORY 04-27-2018, RV for [name] [name], a 84 year old female patient with memory loss.  [name] [name] is now living in her private home with a caregiver that also resides there at night.  This way managing her medications has been easier preparing her meals, she had rare falls. She was in hospital in June- with hypoxemia, CHF, acute on chronic diastolic, possible atrial fibrillation. .  The patient carries a diagnosis of chronic congestive heart failure, diastolic.  However she was so short of breath probably because she has an acute on chronic diastolic failure which caused the hypoxemia, fluid retention and she had to be diuresed. She has been sleepy.  After she lost about 10 pounds in fluid her breathing was much unlabored, she also carries a diagnosis of a cardiomyopathy.  She has  seen Dr. Felipa Eth and  [place]. Actose was discontinued , humolog by sliding scale  and insulin -lantus. She was placed on diuretics. She spend 14 days of rehab- time at [facility]. She returns for evaluation of memory (Lewy body ) and gait.    REVIEW OF SYSTEMS: Out of a complete 14 system review of symptoms, the patient complains only of the following symptoms, and all other reviewed systems are negative.   , Wounds, confusion, weakness, bladder, memory loss  ALLERGIES: Allergies  Allergen Reactions  . Amitriptyline Other (See Comments)    hallucinations  . Vesicare [Solifenacin] Other (See Comments)    hallucinations  . Vicodin [Hydrocodone-Acetaminophen] Other (See Comments)    Hallucinations - do NOT verify Vicodin orders  . Polysaccharide Iron Complex Hives, Itching, Rash and Other (See Comments)    Patient can tolerate Ferrous Sulfate    HOME MEDICATIONS: Outpatient Medications Prior to Visit  Medication Sig Dispense Refill  . acetaminophen (TYLENOL) 650 MG CR tablet Take 650 mg by mouth every evening.     [name] alendronate (FOSAMAX) 70 MG tablet Take 70 mg by mouth every Sunday.   11  . apixaban (ELIQUIS) 5 MG TABS tablet Take 1 tablet (5 mg total) by mouth 2 (two) times daily. 60 tablet 11  . Ascorbic Acid (VITAMIN C) 1000 MG tablet Take 1,000 mg by mouth every Monday, Wednesday, and Friday.     . Carboxymeth-Glycerin-Polysorb (REFRESH OPTIVE ADVANCED) 0.5-1-0.5 % SOLN Place 1 drop into both eyes 2 (two) times daily.    [name]  cholecalciferol (VITAMIN D) 1000 UNITS tablet Take 1,000 Units by mouth See admin instructions. Take 1000 units by mouth daily on Monday, Tuesday, Wednesday, Friday and Saturday.    . COMBIGAN 0.2-0.5 % ophthalmic solution Place 1 drop into both eyes 2 (two) times daily.   0  . Cyanocobalamin (VITAMIN B 12) 100 MCG LOZG Take 1 tablet by mouth daily. (Patient not taking: Reported on 06/22/2018)    . ergocalciferol (VITAMIN D2) 50000 UNITS capsule Take 50,000 Units by mouth See admin  instructions. Take 50000 units by mouth twice weekly on Thursday and Sunday    . felodipine (PLENDIL) 10 MG 24 hr tablet Take 1 tablet (10 mg total) by mouth daily. 30 tablet 6  . ferrous sulfate 325 (65 FE) MG tablet Take 325 mg by mouth daily with breakfast.    . FLUoxetine (PROZAC) 20 MG capsule Take 20 mg by mouth every morning.     . furosemide (LASIX) 40 MG tablet Take 1 tablet (40 mg total) by mouth daily. 30 tablet   . glipiZIDE (GLUCOTROL XL) 5 MG 24 hr tablet Take 5 mg by mouth every evening.   0  . HUMALOG KWIKPEN 100 UNIT/ML KiwkPen Inject 1-9 Units into the skin 3 (three) times daily. Per sliding scale  11  . Insulin Glargine (LANTUS SOLOSTAR) 100 UNIT/ML SOPN Inject 10 Units into the skin at bedtime.     [name] levothyroxine (SYNTHROID, LEVOTHROID) 125 MCG tablet Take 125 mcg by mouth daily before breakfast.     . losartan (COZAAR) 25 MG tablet Take 25 mg by mouth daily.   0  . memantine (NAMENDA) 10 MG tablet TAKE 1 TABLET BY MOUTH TWICE A DAY (Patient taking differently: Take 10 mg by mouth 2 (two) times daily. ) 180 tablet 3  . metoprolol tartrate (LOPRESSOR) 25 MG tablet Take 25 mg by mouth 2 (two) times daily.      . mirabegron ER (MYRBETRIQ) 50 MG TB24 Take 50 mg by mouth every evening.    . polyethylene glycol (MIRALAX / GLYCOLAX) packet Take 17 g by mouth daily as needed for moderate constipation.    . pravastatin (PRAVACHOL) 80 MG tablet Take 1 tablet (80 mg total) by mouth daily. (Patient taking differently: Take 80 mg by mouth every evening. ) 90 tablet 2  . QUEtiapine (SEROQUEL) 25 MG tablet TAKE 1 TABLET BY MOUTH AT BEDTIME (Patient taking differently: Take 25 mg by mouth at bedtime. ) 30 tablet 9  . rivastigmine (EXELON) 4.6 mg/24hr APPLY 1 PATCH EXTERNALLY TO THE SKIN EVERY DAY (Patient taking differently: Place 4.6 mg onto the skin daily. ) 30 patch 4  . SANTYL ointment Apply 1 application topically daily.   1  . silver sulfADIAZINE (SILVADENE) 1 % cream Apply 1 application  topically daily. (Patient not taking: Reported on 06/22/2018) 50 g 0  . traMADol (ULTRAM) 50 MG tablet Take 1-2 tablets (50-100 mg total) by mouth every 6 (six) hours as needed for moderate pain or severe pain. (Patient not taking: Reported on 06/22/2018) 10 tablet 0  . XIIDRA 5 % SOLN Place 1 drop into both eyes 2 (two) times daily.   2   No facility-administered medications prior to visit.     PAST MEDICAL HISTORY: Past Medical History:  Diagnosis Date  . Abnormality of gait 08/13/2015  . Acute respiratory failure with hypoxia ([place])   . Anemia   . Anxiety   . Arthritis    "right wrist, lower back, maybe my right shoulder" (  04/08/2017)  . Atrial fibrillation ([place])   . Bilateral carotid artery stenosis   . Bowel incontinence   . CAD (coronary artery disease)    a. s/p CABG 2012.  . Carotid artery disease ([place])   . Chronic diastolic heart failure ([place]) 02/14/2018  . Chronic lower back pain    "back is very stiff because of fusion; can't stand up straight more than few moments"  . Closed fracture of unspecified part of neck of femur   . Complication of anesthesia    anesth. meds caused hallucinations   . Congestive heart failure ([place])   . Constipation 12/08/2013  . Cough    no fever, chronic  . Degenerative joint disease   . Dementia ([place])   . Dementia ([place]) 12/08/2013  . Depression   . Diabetes mellitus due to underlying condition with diabetic retinopathy 02/12/2016  . Diverticulitis   . DM (diabetes mellitus), type 2 with peripheral vascular complications ([place]) 8/41/3244  . Endometriosis   . Fall at home 05/15/2013  . Gastroparesis due to DM ([place]) 02/12/2016  . GERD (gastroesophageal reflux disease)   . Headache   . Hiatal hernia   . History of blood transfusion   . History of wheezing   . HLD (hyperlipidemia)   . Hx of cardiovascular stress test    Lexiscan Myoview (11/15):  Low risk stress nuclear study with a small, mild fixed apical septal perfusion defect. EF is 52% with  mild septal hypokinesis. No ischemia.  [name] Hx of echocardiogram    Echo (11/15):  Mod LVH, severe septal hypertrophy (suggestive HCM), no LVOT obstruction, rest gradient 15 mmHg across AV, vigorous LVF, EF 65-70%, no RWMA, Gr 1 DD, Ao sclerosis (no stenosis), mod LAE, mild RAE, mod TR, PASP 42 mmHg  . Hyperlipemia 12/08/2013  . Hypertension   . Hypertensive heart disease with CHF (congestive heart failure) ([place])   . Hypertonicity of bladder   . Hypertrophic cardiomyopathy ([place])   . Hypothyroidism   . IDDM (insulin dependent diabetes mellitus) ([place])   . Late onset Alzheimer's disease without behavioral disturbance ([place]) 02/12/2016  . Lewy body dementia with behavioral disturbance ([place]) 02/21/2014  . Lumbago   . Moderate vision impairment-both eyes 05/15/2013  . Muscle weakness (generalized)   . Obesity   . Obesity (BMI 30-39.9) 05/15/2013  . Osteoarthritis of right shoulder 10/07/2013  . Osteoarthrosis, unspecified whether generalized or localized, unspecified site   . Other specified transient cerebral ischemias 08/02/2014  . PAD (peripheral artery disease) ([place]) 04/08/2017  . Palliative care encounter   . Persistent atrial fibrillation 08/02/2014  . Progressive dementia with uncertain etiology ([place]) 05/15/2013   Suspect Lewy body .  [name] Pulmonary hypertension ([place]) 02/14/2018  . Reflux esophagitis   . Rhegmatogenous retinal detachment of right eye 01/10/2013  . Stautus post total shoulder arthroplasty 12/15/2013   RIGHT   . Stroke [facility]) 2011   "old very small TIA/neurologist"  . Thyroid disease   . Type II diabetes mellitus ([place])   . Ulcer    left foot x 2 years sees dr Little Ishikawa podiastrist for wound staying same tiny wart appearing areas no drainage changes dressing every 3 days  . Unspecified glaucoma(365.9)   . Urinary incontinence   . Urinary retention 02/14/2018  . Vitamin B 12 deficiency 02/14/2018  . Vitamin D deficiency 02/14/2018    PAST SURGICAL HISTORY: Past Surgical History:    Procedure Laterality Date  . ABDOMINAL AORTOGRAM W/LOWER EXTREMITY N/A 04/08/2017   Procedure: Abdominal Aortogram  w/Lower Extremity;  Surgeon: Waynetta Sandy, MD;  Location: [place] [facility];  Service: Cardiovascular;  Laterality: N/A;  . ABDOMINAL AORTOGRAM W/LOWER EXTREMITY N/A 06/28/2018   Procedure: ABDOMINAL AORTOGRAM W/LOWER EXTREMITY;  Surgeon: Waynetta Sandy, MD;  Location: [place] [facility];  Service: Cardiovascular;  Laterality: N/A;  . BACK SURGERY     X2 lower  . BOTOX INJECTION N/A 12/02/2016   Procedure: BOTOX INJECTION;  Surgeon: Bjorn Loser, MD;  Location: [facility];  Service: Urology;  Laterality: N/A;  . CARDIAC CATHETERIZATION    . CARPAL TUNNEL RELEASE Bilateral   . CATARACT EXTRACTION W/ INTRAOCULAR LENS  IMPLANT, BILATERAL Bilateral   . CHOLECYSTECTOMY    . CORONARY ARTERY BYPASS GRAFT  04/17/2011   LT  internal mammary artery to left anterior descending, saphenous vein graft first diagonal, saphenous  vein graft to obtuse marginal 1, saphenous vein graft to posterior descending  . CYSTOSCOPY N/A 12/02/2016   Procedure: CYSTOSCOPY;  Surgeon: Bjorn Loser, MD;  Location: [facility];  Service: Urology;  Laterality: N/A;  . DILATION AND CURETTAGE OF UTERUS     "for endometrosis"  . DILATION AND CURETTAGE OF UTERUS     ENDOMETRIOSIS  . ENDARTERECTOMY Left 10/01/2015   Procedure: Left CAROTID Endartarectomy;  Surgeon: Rosetta Posner, MD;  Location: [facility];  Service: Vascular;  Laterality: Left;  . EYE SURGERY    . HAND SURGERY Right    "for arthritis;"  . JOINT REPLACEMENT    . LASER PHOTO ABLATION Right 01/20/2013   Procedure: LASER PHOTO ABLATION;  Surgeon: Hayden Pedro, MD;  Location: [facility];  Service: Ophthalmology;  Laterality: Right;  . LOWER EXTREMITY ANGIOGRAM N/A 03/10/2012   Procedure: LOWER EXTREMITY ANGIOGRAM;  Surgeon: Jettie Booze, MD;  Location: [facility] CATH LAB;  Service: Cardiovascular;  Laterality: N/A;  . NEUROPLASTY /  TRANSPOSITION MEDIAN NERVE AT CARPAL TUNNEL BILATERAL    . PARTIAL HIP ARTHROPLASTY Left 11/2009   "just the ball"  . PERIPHERAL VASCULAR CATHETERIZATION N/A 05/16/2015   Procedure: Abdominal Aortogram;  Surgeon: Elam Dutch, MD;  Location: [facility];  Service: Cardiovascular;  Laterality: N/A;  . PERIPHERAL VASCULAR CATHETERIZATION Left 05/16/2015   Procedure: Lower Extremity Angiography;  Surgeon: Elam Dutch, MD;  Location: [facility] [facility];  Service: Cardiovascular;  Laterality: Left;  . PERIPHERAL VASCULAR INTERVENTION  04/08/2017   Procedure: Peripheral Vascular Intervention;  Surgeon: Waynetta Sandy, MD;  Location: [facility];  Service: Cardiovascular;;  lt.Popliteal  . POSTERIOR FUSION LUMBAR SPINE     "removed L4-5, S1"  . PTCA  03/10/12   LLE  . RETINAL DETACHMENT SURGERY Right May 2014  . SCLERAL BUCKLE Right 01/20/2013   Procedure: SCLERAL BUCKLE;  Surgeon: Hayden Pedro, MD;  Location: [facility];  Service: Ophthalmology;  Laterality: Right;  . TOTAL KNEE ARTHROPLASTY  ~ 2004   right  . TOTAL SHOULDER ARTHROPLASTY Right 10/07/2013   Procedure: TOTAL SHOULDER ARTHROPLASTY;  Surgeon: Alta Corning, MD;  Location: [facility];  Service: Orthopedics;  Laterality: Right;    FAMILY HISTORY: Family History  Problem Relation Age of Onset  . Cancer Cousin   . Heart attack Mother   . Hypertension Mother   . Heart attack Father   . Heart disease Father        Before age 80  . Hyperlipidemia Father   . Hypertension Father   . Cancer Paternal Aunt     SOCIAL HISTORY: Social History  Socioeconomic History  . Marital status: Divorced    Spouse name: Not on file  . Number of children: 2  . Years of education: COLLEGE  . Highest education level: Not on file  Occupational History    Comment: retired Pharmacist, hospital  Social Needs  . Financial resource strain: Not on file  . Food insecurity:    Worry: Not on file    Inability: Not on file  .  Transportation needs:    Medical: Not on file    Non-medical: Not on file  Tobacco Use  . Smoking status: Former Smoker    Packs/day: 1.50    Years: 38.00    Pack years: 57.00    Types: Cigarettes    Last attempt to quit: 09/16/1987    Years since quitting: 30.8  . Smokeless tobacco: Never Used  Substance and Sexual Activity  . Alcohol use: No    Alcohol/week: 0.0 standard drinks  . Drug use: No  . Sexual activity: Never  Lifestyle  . Physical activity:    Days per week: Not on file    Minutes per session: Not on file  . Stress: Not on file  Relationships  . Social connections:    Talks on phone: Not on file    Gets together: Not on file    Attends religious service: Not on file    Active member of club or organization: Not on file    Attends meetings of clubs or organizations: Not on file    Relationship status: Not on file  . Intimate partner violence:    Fear of current or ex partner: Not on file    Emotionally abused: Not on file    Physically abused: Not on file    Forced sexual activity: Not on file  Other Topics Concern  . Not on file  Social History Narrative   Patient is divorced and lives alone.   Patient has two adult children.   Patient is a retired Pharmacist, hospital.   Patient has a college education.   Patient is left-handed.   Patient does not drink any caffeine.      PHYSICAL EXAM  Vitals:   07/13/18 1132  BP: 136/74  Pulse: 100  Weight: 140 lb (63.5 kg)  Height: 5' (1.524 m)   Body mass index is 27.34 kg/m.   MMSE - Mini Mental State Exam 07/13/2018 04/27/2018 10/28/2017  Not completed: (No Data) - -  Orientation to time 1 4 5   Orientation to Place 2 5 4   Registration 3 3 3   Attention/ Calculation 0 5 3  Attention/Calculation-comments - couldnt do numbers -  Recall 0 1 0  Language- name 2 objects 2 2 2   Language- repeat 1 1 1   Language- follow 3 step command 1 3 3   Language- read & follow direction 1 0 1  Write a sentence 1 1 1   Copy design  0 0 1  Total score 12 25 24      Generalized: Well developed, in no acute distress   Neurological examination  Mentation: Alert. Follows all commands speech and language fluent Cranial nerve II-XII: Extraocular movements were full, visual field were full on confrontational test. Facial sensation and strength were normal. Uvula tongue midline. Head turning and shoulder shrug  were normal and symmetric. Motor: The motor testing reveals 5 over 5 strength of all 4 extremities. Good symmetric motor tone is noted throughout.  Sensory: Sensory testing is intact to soft touch on all 4 extremities. No evidence of  extinction is noted.  Coordination: Cerebellar testing reveals good finger-nose-finger.  Did not complete heel-to-shin due to wound on the right heel. Gait and station: Patient uses a walker when ambulating. Reflexes: Deep tendon reflexes are symmetric and normal bilaterally.   DIAGNOSTIC DATA (LABS, IMAGING, TESTING) - I reviewed patient records, labs, notes, testing and imaging myself where available.  Lab Results  Component Value Date   WBC 7.3 02/22/2018   HGB 13.6 06/28/2018   HCT 40.0 06/28/2018   MCV 107 (H) 02/22/2018   PLT 284 02/22/2018      Component Value Date/Time   NA 136 06/28/2018 0935   NA 141 02/22/2018 1655   K 4.1 06/28/2018 0935   CL 97 (L) 06/28/2018 0935   CO2 25 02/22/2018 1655   GLUCOSE 107 (H) 06/28/2018 0935   BUN 23 06/28/2018 0935   BUN 30 (H) 02/22/2018 1655   CREATININE 1.20 (H) 06/28/2018 0935   CALCIUM 9.2 02/22/2018 1655   PROT 6.0 (L) 02/17/2018 0545   ALBUMIN 2.8 (L) 02/17/2018 0545   AST 22 02/17/2018 0545   ALT 15 02/17/2018 0545   ALKPHOS 80 02/17/2018 0545   BILITOT 0.5 02/17/2018 0545   GFRNONAA 43 (L) 02/22/2018 1655   GFRAA 49 (L) 02/22/2018 1655   Lab Results  Component Value Date   CHOL 157 06/26/2014   HDL 30.80 (L) 06/26/2014   LDLCALC 123 (H) 03/20/2014   LDLDIRECT 73.3 06/26/2014   TRIG 250.0 (H) 06/26/2014    CHOLHDL 5 06/26/2014   Lab Results  Component Value Date   HGBA1C 5.7 (H) 02/14/2018   Lab Results  Component Value Date   VITAMINB12 1,304 (H) 02/16/2018   Lab Results  Component Value Date   TSH 1.289 02/14/2018      ASSESSMENT AND PLAN 84 y.o. year old female  has a past medical history of Abnormality of gait (08/13/2015), Acute respiratory failure with hypoxia (HCC), Anemia, Anxiety, Arthritis, Atrial fibrillation (HCC), Bilateral carotid artery stenosis, Bowel incontinence, CAD (coronary artery disease), Carotid artery disease ([place]), Chronic diastolic heart failure (HCC) (02/14/2018), Chronic lower back pain, Closed fracture of unspecified part of neck of femur, Complication of anesthesia, Congestive heart failure ([place]), Constipation (12/08/2013), Cough, Degenerative joint disease, Dementia ([place]), Dementia ([place]) (12/08/2013), Depression, Diabetes mellitus due to underlying condition with diabetic retinopathy (02/12/2016), Diverticulitis, DM (diabetes mellitus), type 2 with peripheral vascular complications ([place]) (2/35/3614), Endometriosis, Fall at home (05/15/2013), Gastroparesis due to DM ([place]) (02/12/2016), GERD (gastroesophageal reflux disease), Headache, Hiatal hernia, History of blood transfusion, History of wheezing, HLD (hyperlipidemia), cardiovascular stress test, echocardiogram, Hyperlipemia (12/08/2013), Hypertension, Hypertensive heart disease with CHF (congestive heart failure) ([place]), Hypertonicity of bladder, Hypertrophic cardiomyopathy ([place]), Hypothyroidism, IDDM (insulin dependent diabetes mellitus) ([place]), Late onset Alzheimer's disease without behavioral disturbance ([place]) (02/12/2016), Lewy body dementia with behavioral disturbance ([place]) (02/21/2014), Lumbago, Moderate vision impairment-both eyes (05/15/2013), Muscle weakness (generalized), Obesity, Obesity (BMI 30-39.9) (05/15/2013), Osteoarthritis of right shoulder (10/07/2013), Osteoarthrosis, unspecified whether generalized or localized,  unspecified site, Other specified transient cerebral ischemias (08/02/2014), PAD (peripheral artery disease) ([place]) (04/08/2017), Palliative care encounter, Persistent atrial fibrillation (08/02/2014), Progressive dementia with uncertain etiology ([place]) (05/15/2013), Pulmonary hypertension ([place]) (02/14/2018), Reflux esophagitis, Rhegmatogenous retinal detachment of right eye (01/10/2013), Stautus post total shoulder arthroplasty (12/15/2013), Stroke ([place]) (2011), Thyroid disease, Type II diabetes mellitus ([place]), Ulcer, Unspecified glaucoma(365.9), Urinary incontinence, Urinary retention (02/14/2018), Vitamin B 12 deficiency (02/14/2018), and Vitamin D deficiency (02/14/2018). here with :  1.  Dementia  The patient's memory score has declined since the  last visit.  She had a urinalysis that was unremarkable.  She has not had any blood work completed.  I will check a CBC and CMP to look for any reversible causes of confusion.  The patient will remain on Seroquel and Exelon.  I have advised that if symptoms worsen or she develops new symptoms they should let us know.   I spent 15 minutes with the patient. 50% of this time was spent reviewing memory score and plan of care   Ward Givens, MSN, NP-C 07/13/2018, 11:24 AM [facility] [address], [place], [place] [address] [phone]

## 2018-07-14 ENCOUNTER — Telehealth: Payer: Self-pay | Admitting: Interventional Cardiology

## 2018-07-14 LAB — CBC WITH DIFFERENTIAL/PLATELET
Basophils Absolute: 0.1 10*3/uL (ref 0.0–0.2)
Basos: 1 %
EOS (ABSOLUTE): 0.1 10*3/uL (ref 0.0–0.4)
Eos: 1 %
Hematocrit: 35.7 % (ref 34.0–46.6)
Hemoglobin: 11.9 g/dL (ref 11.1–15.9)
IMMATURE GRANS (ABS): 0 10*3/uL (ref 0.0–0.1)
IMMATURE GRANULOCYTES: 0 %
Lymphocytes Absolute: 1 10*3/uL (ref 0.7–3.1)
Lymphs: 9 %
MCH: 35.2 pg — ABNORMAL HIGH (ref 26.6–33.0)
MCHC: 33.3 g/dL (ref 31.5–35.7)
MCV: 106 fL — AB (ref 79–97)
MONOCYTES: 8 %
MONOS ABS: 0.9 10*3/uL (ref 0.1–0.9)
Neutrophils Absolute: 8.8 10*3/uL — ABNORMAL HIGH (ref 1.4–7.0)
Neutrophils: 81 %
PLATELETS: 331 10*3/uL (ref 150–450)
RBC: 3.38 x10E6/uL — AB (ref 3.77–5.28)
RDW: 12.9 % (ref 12.3–15.4)
WBC: 10.8 10*3/uL (ref 3.4–10.8)

## 2018-07-14 LAB — COMPREHENSIVE METABOLIC PANEL
ALT: 13 IU/L (ref 0–32)
AST: 19 IU/L (ref 0–40)
Albumin/Globulin Ratio: 1.2 (ref 1.2–2.2)
Albumin: 3.3 g/dL — ABNORMAL LOW (ref 3.5–4.7)
Alkaline Phosphatase: 112 IU/L (ref 39–117)
BILIRUBIN TOTAL: 0.5 mg/dL (ref 0.0–1.2)
BUN / CREAT RATIO: 19 (ref 12–28)
BUN: 19 mg/dL (ref 8–27)
CHLORIDE: 96 mmol/L (ref 96–106)
CO2: 26 mmol/L (ref 20–29)
Calcium: 9.4 mg/dL (ref 8.7–10.3)
Creatinine, Ser: 1.02 mg/dL — ABNORMAL HIGH (ref 0.57–1.00)
GFR calc non Af Amer: 51 mL/min/{1.73_m2} — ABNORMAL LOW (ref >59)
GFR, EST AFRICAN AMERICAN: 59 mL/min/{1.73_m2} — AB (ref >59)
Globulin, Total: 2.8 g/dL (ref 1.5–4.5)
Glucose: 283 mg/dL — ABNORMAL HIGH (ref 65–99)
Potassium: 4.6 mmol/L (ref 3.5–5.2)
Sodium: 135 mmol/L (ref 134–144)
TOTAL PROTEIN: 6.1 g/dL (ref 6.0–8.5)

## 2018-07-14 NOTE — Telephone Encounter (Signed)
Called and left a message for patient's daughter [name] to call back.

## 2018-07-14 NOTE — Telephone Encounter (Signed)
New Message         Patient's daughter is calling today to get a return call so that they can update the doctor on the patient medical condition. It's a life or death matter. Pls call and advise

## 2018-07-15 ENCOUNTER — Telehealth: Payer: Self-pay

## 2018-07-15 NOTE — Telephone Encounter (Signed)
Follow up ° °Patient's daughter is returning call. °

## 2018-07-15 NOTE — Telephone Encounter (Signed)
Spoke with the patient's caregiver Manuela Schwartz about the lab results. She verbalized understanding the results. No questions or concerns at this time.

## 2018-07-15 NOTE — Telephone Encounter (Signed)
-----   Message from Ward Givens, NP sent at 07/15/2018  2:58 PM EDT ----- Blood work is consistent with previous blood work.  Glucose was elevated.  Please call patient with results.

## 2018-07-15 NOTE — Telephone Encounter (Signed)
Returned call to [employer] (DPR on file). She states that the patient has diabetic wounds on her lower extremities that are not treatable. She states that the patient recently had an abdominal aortogram with lower extremities and they were unable to intervene or do anything to improve circulation. She states that she is receiving wound care but will likely require bilateral leg amputation. She states that they feel like the patient would be too high risk to have the surgery and do not want to proceed. She states that the wound care specialists has been in contact with Dr. Felipa Eth and they agree with the family. She was calling to make Dr. Irish Lack aware.

## 2018-07-16 ENCOUNTER — Encounter: Payer: Self-pay | Admitting: Adult Health

## 2018-07-16 ENCOUNTER — Encounter (HOSPITAL_BASED_OUTPATIENT_CLINIC_OR_DEPARTMENT_OTHER): Payer: Medicare Other | Attending: Internal Medicine

## 2018-07-16 DIAGNOSIS — E1142 Type 2 diabetes mellitus with diabetic polyneuropathy: Secondary | ICD-10-CM | POA: Insufficient documentation

## 2018-07-16 DIAGNOSIS — B952 Enterococcus as the cause of diseases classified elsewhere: Secondary | ICD-10-CM | POA: Insufficient documentation

## 2018-07-16 DIAGNOSIS — L97422 Non-pressure chronic ulcer of left heel and midfoot with fat layer exposed: Secondary | ICD-10-CM | POA: Diagnosis not present

## 2018-07-16 DIAGNOSIS — M86671 Other chronic osteomyelitis, right ankle and foot: Secondary | ICD-10-CM | POA: Diagnosis not present

## 2018-07-16 DIAGNOSIS — E11621 Type 2 diabetes mellitus with foot ulcer: Secondary | ICD-10-CM | POA: Diagnosis not present

## 2018-07-16 DIAGNOSIS — K521 Toxic gastroenteritis and colitis: Secondary | ICD-10-CM | POA: Insufficient documentation

## 2018-07-16 DIAGNOSIS — T3695XA Adverse effect of unspecified systemic antibiotic, initial encounter: Secondary | ICD-10-CM | POA: Diagnosis not present

## 2018-07-16 DIAGNOSIS — B9689 Other specified bacterial agents as the cause of diseases classified elsewhere: Secondary | ICD-10-CM | POA: Insufficient documentation

## 2018-07-16 DIAGNOSIS — L97419 Non-pressure chronic ulcer of right heel and midfoot with unspecified severity: Secondary | ICD-10-CM | POA: Diagnosis not present

## 2018-07-16 DIAGNOSIS — E1169 Type 2 diabetes mellitus with other specified complication: Secondary | ICD-10-CM | POA: Insufficient documentation

## 2018-07-16 DIAGNOSIS — E1151 Type 2 diabetes mellitus with diabetic peripheral angiopathy without gangrene: Secondary | ICD-10-CM | POA: Insufficient documentation

## 2018-07-20 ENCOUNTER — Other Ambulatory Visit: Payer: Self-pay | Admitting: Interventional Cardiology

## 2018-07-20 MED ORDER — PRAVASTATIN SODIUM 80 MG PO TABS: 80.0000 mg | ORAL_TABLET | Freq: Every day | ORAL | 2 refills | Status: AC

## 2018-07-30 ENCOUNTER — Ambulatory Visit: Payer: Medicare Other | Admitting: Family

## 2018-08-16 ENCOUNTER — Other Ambulatory Visit: Payer: Medicare Other

## 2018-08-19 ENCOUNTER — Encounter: Payer: Self-pay | Admitting: Neurology

## 2018-08-31 ENCOUNTER — Ambulatory Visit: Payer: Medicare Other | Admitting: Family

## 2018-08-31 ENCOUNTER — Other Ambulatory Visit (HOSPITAL_COMMUNITY): Payer: Medicare Other

## 2018-08-31 ENCOUNTER — Encounter (HOSPITAL_COMMUNITY): Payer: Medicare Other

## 2018-10-28 ENCOUNTER — Ambulatory Visit: Payer: Medicare Other | Admitting: Neurology

## 2018-11-08 ENCOUNTER — Encounter (INDEPENDENT_AMBULATORY_CARE_PROVIDER_SITE_OTHER): Payer: Medicare Other | Admitting: Ophthalmology

## 2018-12-15 DEATH — deceased

## 2019-01-05 ENCOUNTER — Encounter: Payer: Self-pay | Admitting: Neurology

## 2019-01-05 NOTE — Telephone Encounter (Signed)
I am very  sorry for your loss. Your mother was a memorable, lovely woman and I enjoyed taking care of her .

## 2019-01-12 ENCOUNTER — Ambulatory Visit: Payer: Medicare Other | Admitting: Neurology

## 2019-01-14 NOTE — Telephone Encounter (Signed)
Sympathy Card has been sent in the mail

## 2019-04-02 IMAGING — CR DG FOOT COMPLETE 3+V*R*
3 series · 3 of 3 positions shown · non-contrast
Comparison: None.

CLINICAL DATA: Right foot pain.  Nonhealing ulcer on heel.

EXAM:
RIGHT FOOT COMPLETE - 3+ VIEW

[t foot ap right]
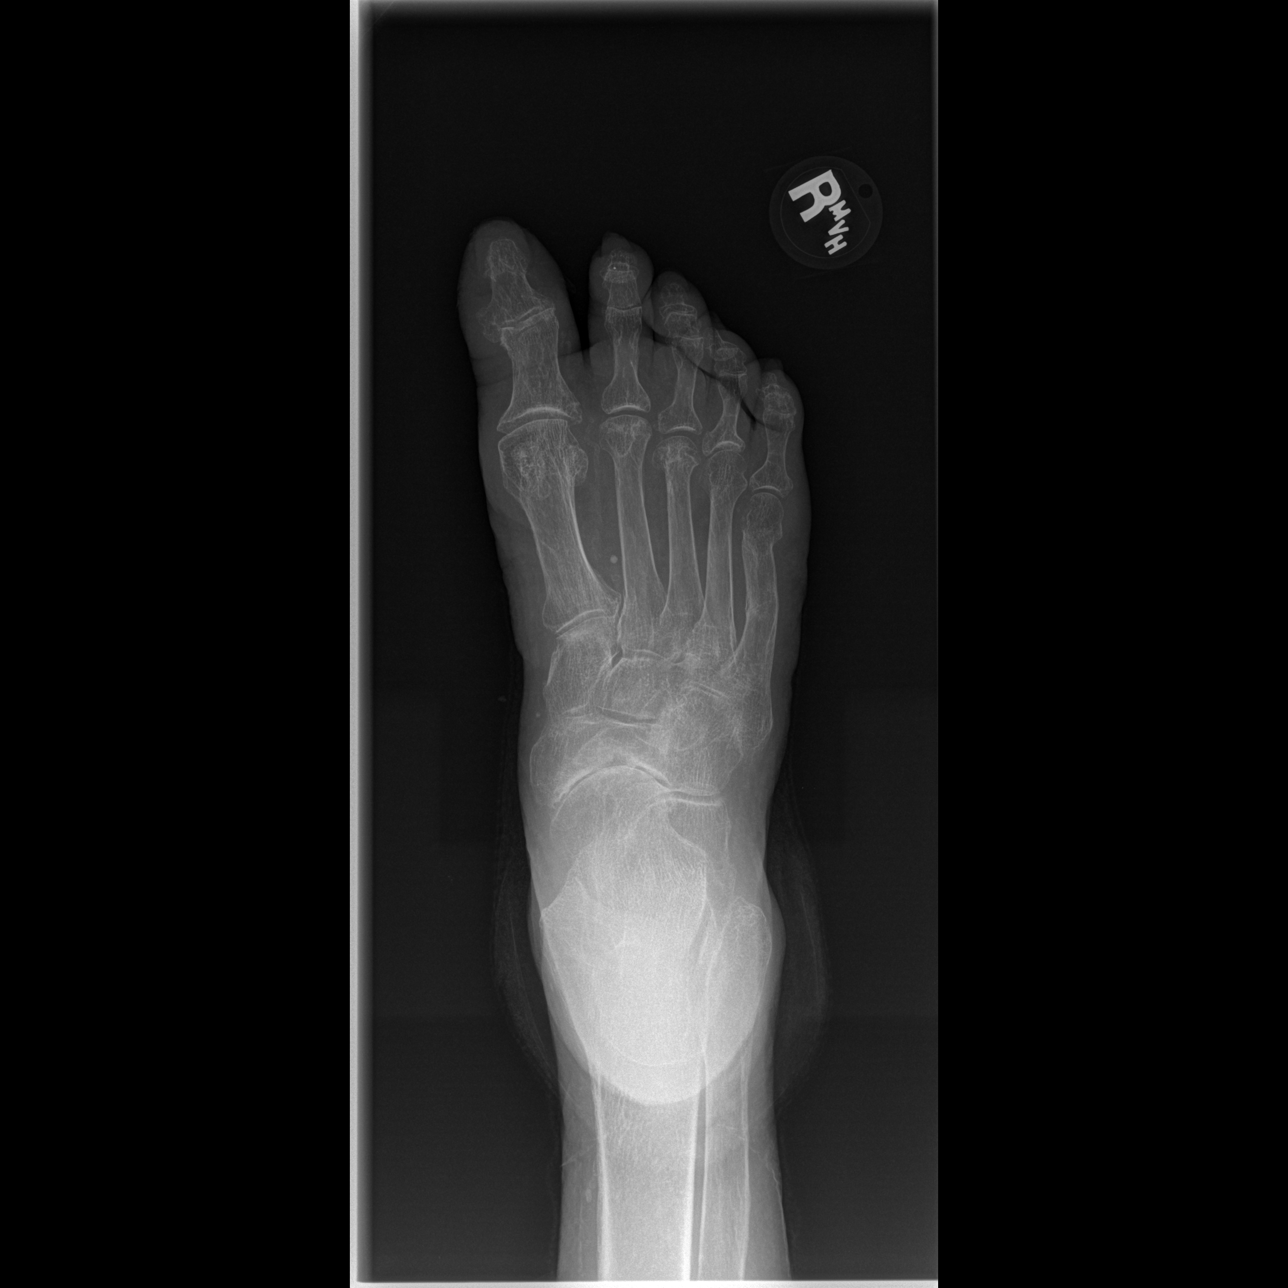

[t foot oblique right]
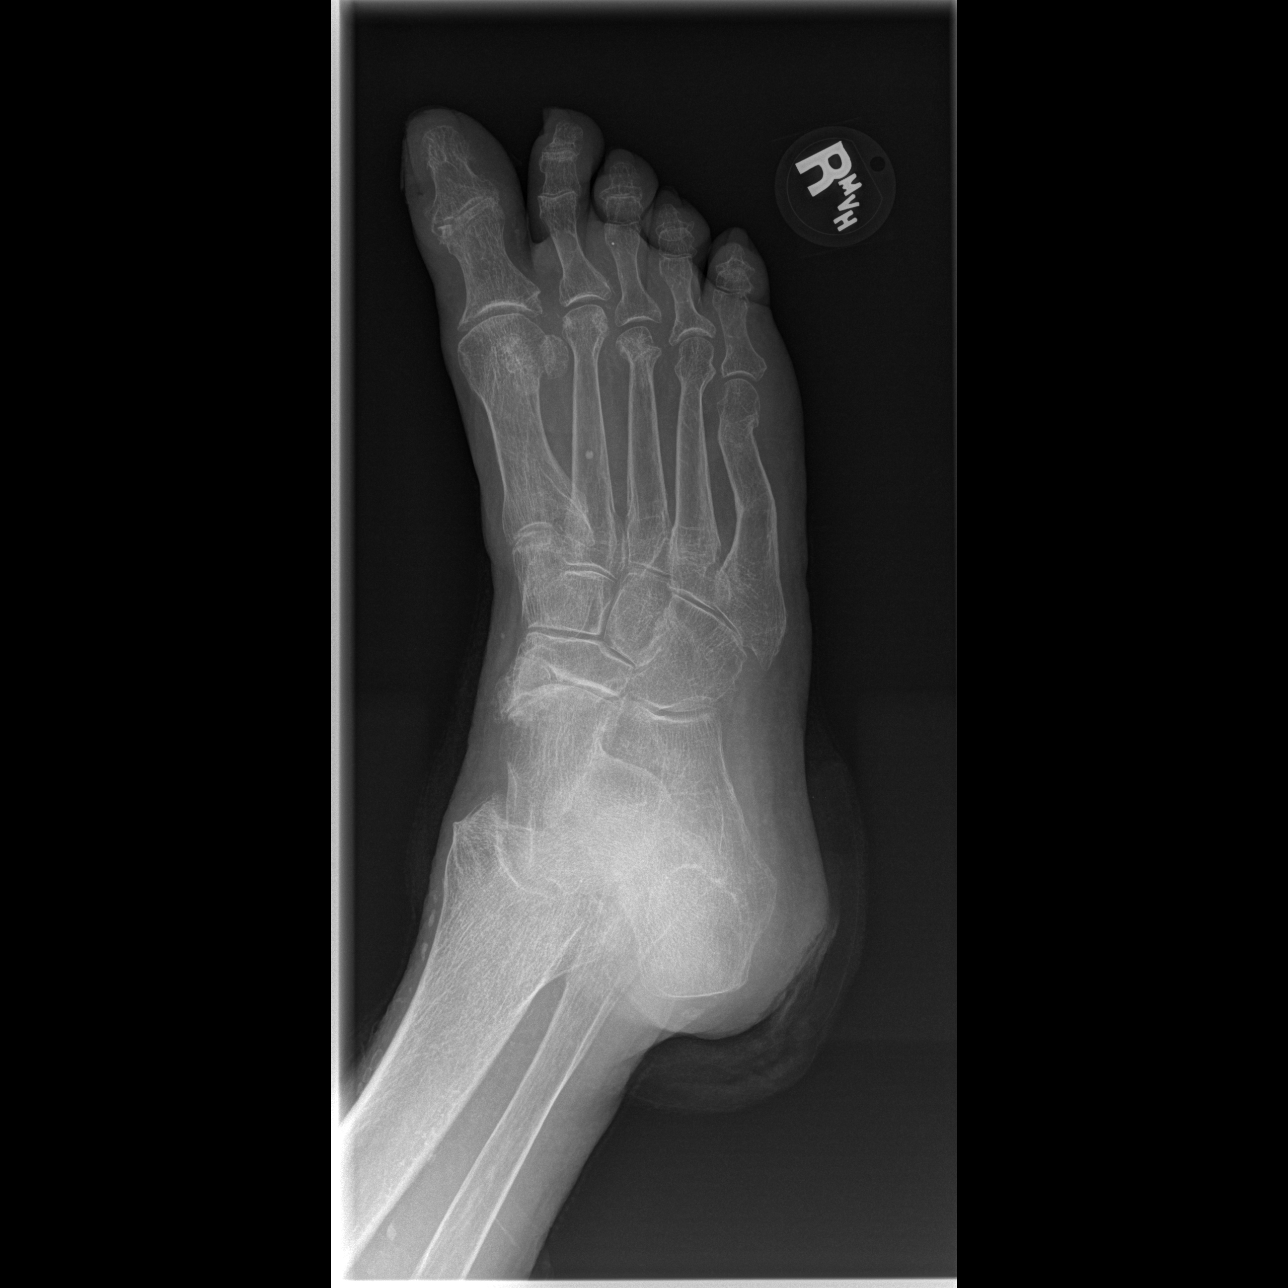

[t foot lat right]
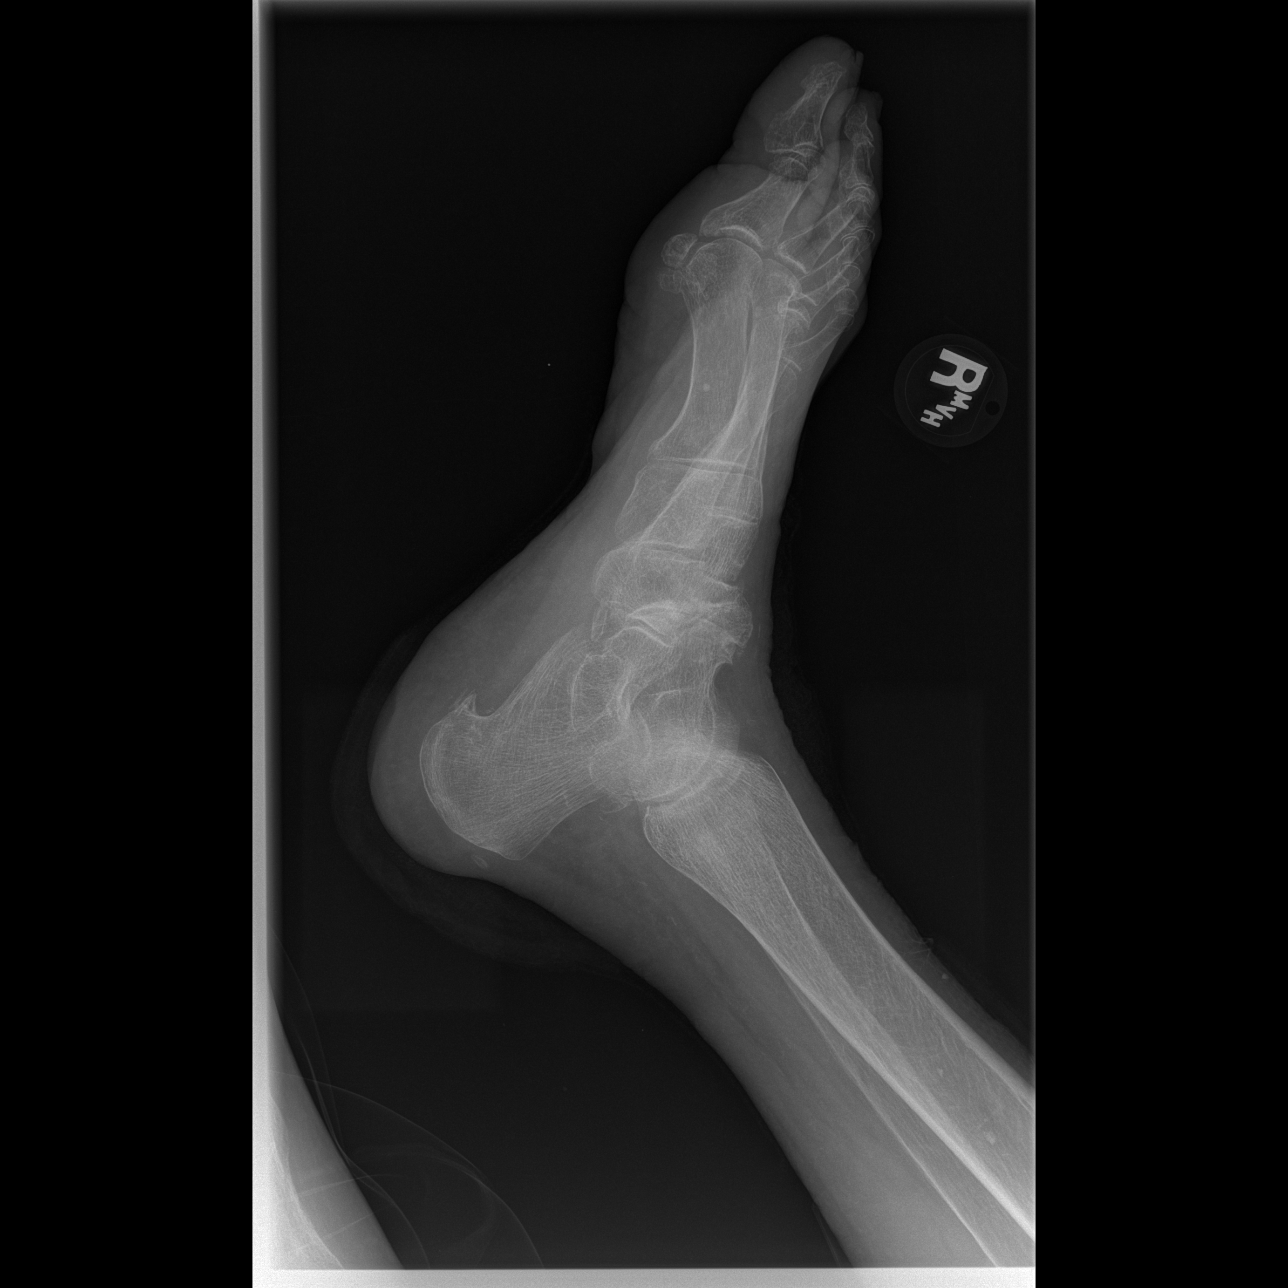

[3 of 3 positions shown; findings below may reference images not displayed]

FINDINGS: Generalized osteopenia noted. Old fracture deformity of 5th
metatarsal is noted. No evidence of acute fracture or dislocation.
Mild degenerative spurring is seen involving the MTP and
interphalangeal joints of the great toe. Osteoarthritis and
accessory ossicle is seen at the talonavicular joint. Small
calcaneal bone spur noted. No evidence of osteolysis or periostitis.
IMPRESSION: No radiographic evidence of osteomyelitis or other acute findings.

Osteoarthritis involving the great toe and talonavicular joint.

Osteopenia.
